# Patient Record
Sex: Male | Born: 1965 | Race: White | Smoking: Never smoker
Health system: Southern US, Community
[De-identification: ages and names within clinical notes are randomized; demographics above are authoritative.]

## PROBLEM LIST (undated history)

## (undated) DIAGNOSIS — D638 Anemia in other chronic diseases classified elsewhere: Secondary | ICD-10-CM

## (undated) DIAGNOSIS — B1920 Unspecified viral hepatitis C without hepatic coma: Secondary | ICD-10-CM

## (undated) DIAGNOSIS — B49 Unspecified mycosis: Secondary | ICD-10-CM

## (undated) DIAGNOSIS — E119 Type 2 diabetes mellitus without complications: Secondary | ICD-10-CM

## (undated) DIAGNOSIS — K219 Gastro-esophageal reflux disease without esophagitis: Secondary | ICD-10-CM

## (undated) DIAGNOSIS — Z794 Long term (current) use of insulin: Secondary | ICD-10-CM

## (undated) HISTORY — PX: TRACHEOSTOMY: SUR1362

---

## 2019-03-07 DIAGNOSIS — J9691 Respiratory failure, unspecified with hypoxia: Secondary | ICD-10-CM

## 2019-03-07 HISTORY — DX: Respiratory failure, unspecified with hypoxia: J96.91

## 2019-03-07 HISTORY — PX: CERVICAL SPINE SURGERY: SHX589

## 2019-09-04 DIAGNOSIS — I469 Cardiac arrest, cause unspecified: Secondary | ICD-10-CM

## 2019-09-04 DIAGNOSIS — R001 Bradycardia, unspecified: Secondary | ICD-10-CM

## 2019-09-04 HISTORY — DX: Bradycardia, unspecified: R00.1

## 2019-09-04 HISTORY — PX: PACEMAKER LEADLESS INSERTION: EP1219

## 2019-09-04 HISTORY — DX: Cardiac arrest, cause unspecified: I46.9

## 2019-11-17 DIAGNOSIS — G8250 Quadriplegia, unspecified: Secondary | ICD-10-CM | POA: Diagnosis not present

## 2019-11-17 DIAGNOSIS — I5020 Unspecified systolic (congestive) heart failure: Secondary | ICD-10-CM | POA: Diagnosis not present

## 2019-11-17 DIAGNOSIS — G894 Chronic pain syndrome: Secondary | ICD-10-CM | POA: Diagnosis not present

## 2019-11-17 DIAGNOSIS — J9621 Acute and chronic respiratory failure with hypoxia: Secondary | ICD-10-CM | POA: Diagnosis not present

## 2019-11-18 DIAGNOSIS — G894 Chronic pain syndrome: Secondary | ICD-10-CM | POA: Diagnosis not present

## 2019-11-18 DIAGNOSIS — J9621 Acute and chronic respiratory failure with hypoxia: Secondary | ICD-10-CM | POA: Diagnosis not present

## 2019-11-18 DIAGNOSIS — G8250 Quadriplegia, unspecified: Secondary | ICD-10-CM | POA: Diagnosis not present

## 2019-11-18 DIAGNOSIS — I5020 Unspecified systolic (congestive) heart failure: Secondary | ICD-10-CM | POA: Diagnosis not present

## 2019-11-19 DIAGNOSIS — J9621 Acute and chronic respiratory failure with hypoxia: Secondary | ICD-10-CM | POA: Diagnosis not present

## 2019-11-19 DIAGNOSIS — G8250 Quadriplegia, unspecified: Secondary | ICD-10-CM | POA: Diagnosis not present

## 2019-11-19 DIAGNOSIS — I5020 Unspecified systolic (congestive) heart failure: Secondary | ICD-10-CM | POA: Diagnosis not present

## 2019-11-19 DIAGNOSIS — G894 Chronic pain syndrome: Secondary | ICD-10-CM | POA: Diagnosis not present

## 2019-11-20 DIAGNOSIS — G8250 Quadriplegia, unspecified: Secondary | ICD-10-CM | POA: Diagnosis not present

## 2019-11-20 DIAGNOSIS — J9621 Acute and chronic respiratory failure with hypoxia: Secondary | ICD-10-CM | POA: Diagnosis not present

## 2019-11-20 DIAGNOSIS — G894 Chronic pain syndrome: Secondary | ICD-10-CM | POA: Diagnosis not present

## 2019-11-20 DIAGNOSIS — I5020 Unspecified systolic (congestive) heart failure: Secondary | ICD-10-CM | POA: Diagnosis not present

## 2019-11-21 DIAGNOSIS — G894 Chronic pain syndrome: Secondary | ICD-10-CM | POA: Diagnosis not present

## 2019-11-21 DIAGNOSIS — J9621 Acute and chronic respiratory failure with hypoxia: Secondary | ICD-10-CM | POA: Diagnosis not present

## 2019-11-21 DIAGNOSIS — I5020 Unspecified systolic (congestive) heart failure: Secondary | ICD-10-CM | POA: Diagnosis not present

## 2019-11-21 DIAGNOSIS — G8250 Quadriplegia, unspecified: Secondary | ICD-10-CM | POA: Diagnosis not present

## 2019-11-22 DIAGNOSIS — G8250 Quadriplegia, unspecified: Secondary | ICD-10-CM | POA: Diagnosis not present

## 2019-11-22 DIAGNOSIS — G894 Chronic pain syndrome: Secondary | ICD-10-CM | POA: Diagnosis not present

## 2019-11-22 DIAGNOSIS — I5020 Unspecified systolic (congestive) heart failure: Secondary | ICD-10-CM | POA: Diagnosis not present

## 2019-11-22 DIAGNOSIS — J9621 Acute and chronic respiratory failure with hypoxia: Secondary | ICD-10-CM | POA: Diagnosis not present

## 2019-11-23 DIAGNOSIS — G8250 Quadriplegia, unspecified: Secondary | ICD-10-CM | POA: Diagnosis not present

## 2019-11-23 DIAGNOSIS — I5020 Unspecified systolic (congestive) heart failure: Secondary | ICD-10-CM | POA: Diagnosis not present

## 2019-11-23 DIAGNOSIS — G894 Chronic pain syndrome: Secondary | ICD-10-CM | POA: Diagnosis not present

## 2019-11-23 DIAGNOSIS — J9621 Acute and chronic respiratory failure with hypoxia: Secondary | ICD-10-CM | POA: Diagnosis not present

## 2019-12-01 DIAGNOSIS — G894 Chronic pain syndrome: Secondary | ICD-10-CM | POA: Diagnosis not present

## 2019-12-01 DIAGNOSIS — G8250 Quadriplegia, unspecified: Secondary | ICD-10-CM | POA: Diagnosis not present

## 2019-12-01 DIAGNOSIS — J9621 Acute and chronic respiratory failure with hypoxia: Secondary | ICD-10-CM | POA: Diagnosis not present

## 2019-12-01 DIAGNOSIS — I5020 Unspecified systolic (congestive) heart failure: Secondary | ICD-10-CM | POA: Diagnosis not present

## 2019-12-02 DIAGNOSIS — I5020 Unspecified systolic (congestive) heart failure: Secondary | ICD-10-CM | POA: Diagnosis not present

## 2019-12-02 DIAGNOSIS — G8250 Quadriplegia, unspecified: Secondary | ICD-10-CM | POA: Diagnosis not present

## 2019-12-02 DIAGNOSIS — G894 Chronic pain syndrome: Secondary | ICD-10-CM | POA: Diagnosis not present

## 2019-12-02 DIAGNOSIS — J9621 Acute and chronic respiratory failure with hypoxia: Secondary | ICD-10-CM | POA: Diagnosis not present

## 2019-12-03 DIAGNOSIS — G894 Chronic pain syndrome: Secondary | ICD-10-CM | POA: Diagnosis not present

## 2019-12-03 DIAGNOSIS — G8250 Quadriplegia, unspecified: Secondary | ICD-10-CM | POA: Diagnosis not present

## 2019-12-03 DIAGNOSIS — I5020 Unspecified systolic (congestive) heart failure: Secondary | ICD-10-CM | POA: Diagnosis not present

## 2019-12-03 DIAGNOSIS — J9621 Acute and chronic respiratory failure with hypoxia: Secondary | ICD-10-CM | POA: Diagnosis not present

## 2019-12-04 DIAGNOSIS — G894 Chronic pain syndrome: Secondary | ICD-10-CM | POA: Diagnosis not present

## 2019-12-04 DIAGNOSIS — G8250 Quadriplegia, unspecified: Secondary | ICD-10-CM | POA: Diagnosis not present

## 2019-12-04 DIAGNOSIS — J9621 Acute and chronic respiratory failure with hypoxia: Secondary | ICD-10-CM | POA: Diagnosis not present

## 2019-12-04 DIAGNOSIS — I5020 Unspecified systolic (congestive) heart failure: Secondary | ICD-10-CM | POA: Diagnosis not present

## 2019-12-05 DIAGNOSIS — G8250 Quadriplegia, unspecified: Secondary | ICD-10-CM

## 2019-12-05 DIAGNOSIS — J9621 Acute and chronic respiratory failure with hypoxia: Secondary | ICD-10-CM | POA: Diagnosis not present

## 2019-12-05 DIAGNOSIS — G894 Chronic pain syndrome: Secondary | ICD-10-CM | POA: Diagnosis not present

## 2019-12-05 DIAGNOSIS — I5020 Unspecified systolic (congestive) heart failure: Secondary | ICD-10-CM

## 2019-12-06 DIAGNOSIS — J9621 Acute and chronic respiratory failure with hypoxia: Secondary | ICD-10-CM | POA: Diagnosis not present

## 2019-12-06 DIAGNOSIS — G8250 Quadriplegia, unspecified: Secondary | ICD-10-CM

## 2019-12-06 DIAGNOSIS — G894 Chronic pain syndrome: Secondary | ICD-10-CM

## 2019-12-06 DIAGNOSIS — I5020 Unspecified systolic (congestive) heart failure: Secondary | ICD-10-CM | POA: Diagnosis not present

## 2019-12-07 DIAGNOSIS — I5020 Unspecified systolic (congestive) heart failure: Secondary | ICD-10-CM

## 2019-12-07 DIAGNOSIS — G8250 Quadriplegia, unspecified: Secondary | ICD-10-CM | POA: Diagnosis not present

## 2019-12-07 DIAGNOSIS — J9621 Acute and chronic respiratory failure with hypoxia: Secondary | ICD-10-CM | POA: Diagnosis not present

## 2019-12-07 DIAGNOSIS — G894 Chronic pain syndrome: Secondary | ICD-10-CM

## 2019-12-15 DIAGNOSIS — J9621 Acute and chronic respiratory failure with hypoxia: Secondary | ICD-10-CM | POA: Diagnosis not present

## 2019-12-15 DIAGNOSIS — G894 Chronic pain syndrome: Secondary | ICD-10-CM | POA: Diagnosis not present

## 2019-12-15 DIAGNOSIS — I5020 Unspecified systolic (congestive) heart failure: Secondary | ICD-10-CM | POA: Diagnosis not present

## 2019-12-15 DIAGNOSIS — G8250 Quadriplegia, unspecified: Secondary | ICD-10-CM | POA: Diagnosis not present

## 2019-12-16 DIAGNOSIS — J9621 Acute and chronic respiratory failure with hypoxia: Secondary | ICD-10-CM | POA: Diagnosis not present

## 2019-12-16 DIAGNOSIS — G894 Chronic pain syndrome: Secondary | ICD-10-CM | POA: Diagnosis not present

## 2019-12-16 DIAGNOSIS — G8250 Quadriplegia, unspecified: Secondary | ICD-10-CM | POA: Diagnosis not present

## 2019-12-16 DIAGNOSIS — I5020 Unspecified systolic (congestive) heart failure: Secondary | ICD-10-CM | POA: Diagnosis not present

## 2019-12-17 DIAGNOSIS — I5020 Unspecified systolic (congestive) heart failure: Secondary | ICD-10-CM | POA: Diagnosis not present

## 2019-12-17 DIAGNOSIS — J9621 Acute and chronic respiratory failure with hypoxia: Secondary | ICD-10-CM | POA: Diagnosis not present

## 2019-12-17 DIAGNOSIS — G894 Chronic pain syndrome: Secondary | ICD-10-CM | POA: Diagnosis not present

## 2019-12-17 DIAGNOSIS — G8250 Quadriplegia, unspecified: Secondary | ICD-10-CM | POA: Diagnosis not present

## 2019-12-18 DIAGNOSIS — J9621 Acute and chronic respiratory failure with hypoxia: Secondary | ICD-10-CM | POA: Diagnosis not present

## 2019-12-18 DIAGNOSIS — I5020 Unspecified systolic (congestive) heart failure: Secondary | ICD-10-CM | POA: Diagnosis not present

## 2019-12-18 DIAGNOSIS — G8250 Quadriplegia, unspecified: Secondary | ICD-10-CM | POA: Diagnosis not present

## 2019-12-18 DIAGNOSIS — G894 Chronic pain syndrome: Secondary | ICD-10-CM | POA: Diagnosis not present

## 2019-12-19 DIAGNOSIS — I5020 Unspecified systolic (congestive) heart failure: Secondary | ICD-10-CM | POA: Diagnosis not present

## 2019-12-19 DIAGNOSIS — G8250 Quadriplegia, unspecified: Secondary | ICD-10-CM

## 2019-12-19 DIAGNOSIS — G894 Chronic pain syndrome: Secondary | ICD-10-CM | POA: Diagnosis not present

## 2019-12-19 DIAGNOSIS — J9621 Acute and chronic respiratory failure with hypoxia: Secondary | ICD-10-CM | POA: Diagnosis not present

## 2019-12-20 DIAGNOSIS — I5020 Unspecified systolic (congestive) heart failure: Secondary | ICD-10-CM | POA: Diagnosis not present

## 2019-12-20 DIAGNOSIS — G8250 Quadriplegia, unspecified: Secondary | ICD-10-CM

## 2019-12-20 DIAGNOSIS — J9621 Acute and chronic respiratory failure with hypoxia: Secondary | ICD-10-CM

## 2019-12-20 DIAGNOSIS — G894 Chronic pain syndrome: Secondary | ICD-10-CM

## 2019-12-21 DIAGNOSIS — G8250 Quadriplegia, unspecified: Secondary | ICD-10-CM | POA: Diagnosis not present

## 2019-12-21 DIAGNOSIS — I5020 Unspecified systolic (congestive) heart failure: Secondary | ICD-10-CM | POA: Diagnosis not present

## 2019-12-21 DIAGNOSIS — J9621 Acute and chronic respiratory failure with hypoxia: Secondary | ICD-10-CM | POA: Diagnosis not present

## 2019-12-21 DIAGNOSIS — G894 Chronic pain syndrome: Secondary | ICD-10-CM | POA: Diagnosis not present

## 2019-12-27 DIAGNOSIS — I5020 Unspecified systolic (congestive) heart failure: Secondary | ICD-10-CM | POA: Diagnosis not present

## 2019-12-27 DIAGNOSIS — G8250 Quadriplegia, unspecified: Secondary | ICD-10-CM

## 2019-12-27 DIAGNOSIS — J9621 Acute and chronic respiratory failure with hypoxia: Secondary | ICD-10-CM

## 2019-12-27 DIAGNOSIS — G894 Chronic pain syndrome: Secondary | ICD-10-CM

## 2019-12-28 DIAGNOSIS — J9621 Acute and chronic respiratory failure with hypoxia: Secondary | ICD-10-CM | POA: Diagnosis not present

## 2019-12-28 DIAGNOSIS — I5020 Unspecified systolic (congestive) heart failure: Secondary | ICD-10-CM

## 2019-12-28 DIAGNOSIS — G8250 Quadriplegia, unspecified: Secondary | ICD-10-CM | POA: Diagnosis not present

## 2019-12-28 DIAGNOSIS — G894 Chronic pain syndrome: Secondary | ICD-10-CM | POA: Diagnosis not present

## 2019-12-29 DIAGNOSIS — J9621 Acute and chronic respiratory failure with hypoxia: Secondary | ICD-10-CM | POA: Diagnosis not present

## 2019-12-29 DIAGNOSIS — I5020 Unspecified systolic (congestive) heart failure: Secondary | ICD-10-CM

## 2019-12-29 DIAGNOSIS — G894 Chronic pain syndrome: Secondary | ICD-10-CM

## 2019-12-29 DIAGNOSIS — G8250 Quadriplegia, unspecified: Secondary | ICD-10-CM

## 2020-01-12 DIAGNOSIS — J9621 Acute and chronic respiratory failure with hypoxia: Secondary | ICD-10-CM | POA: Diagnosis not present

## 2020-01-12 DIAGNOSIS — I5020 Unspecified systolic (congestive) heart failure: Secondary | ICD-10-CM

## 2020-01-12 DIAGNOSIS — G894 Chronic pain syndrome: Secondary | ICD-10-CM

## 2020-01-12 DIAGNOSIS — G8250 Quadriplegia, unspecified: Secondary | ICD-10-CM | POA: Diagnosis not present

## 2020-01-13 DIAGNOSIS — G8250 Quadriplegia, unspecified: Secondary | ICD-10-CM

## 2020-01-13 DIAGNOSIS — G894 Chronic pain syndrome: Secondary | ICD-10-CM

## 2020-01-13 DIAGNOSIS — I5020 Unspecified systolic (congestive) heart failure: Secondary | ICD-10-CM | POA: Diagnosis not present

## 2020-01-13 DIAGNOSIS — J9621 Acute and chronic respiratory failure with hypoxia: Secondary | ICD-10-CM | POA: Diagnosis not present

## 2020-01-14 DIAGNOSIS — I5020 Unspecified systolic (congestive) heart failure: Secondary | ICD-10-CM | POA: Diagnosis not present

## 2020-01-14 DIAGNOSIS — G8250 Quadriplegia, unspecified: Secondary | ICD-10-CM

## 2020-01-14 DIAGNOSIS — G894 Chronic pain syndrome: Secondary | ICD-10-CM

## 2020-01-14 DIAGNOSIS — J9621 Acute and chronic respiratory failure with hypoxia: Secondary | ICD-10-CM

## 2020-01-15 DIAGNOSIS — G894 Chronic pain syndrome: Secondary | ICD-10-CM | POA: Diagnosis not present

## 2020-01-15 DIAGNOSIS — G8250 Quadriplegia, unspecified: Secondary | ICD-10-CM | POA: Diagnosis not present

## 2020-01-15 DIAGNOSIS — I5020 Unspecified systolic (congestive) heart failure: Secondary | ICD-10-CM | POA: Diagnosis not present

## 2020-01-15 DIAGNOSIS — J9621 Acute and chronic respiratory failure with hypoxia: Secondary | ICD-10-CM | POA: Diagnosis not present

## 2020-01-16 DIAGNOSIS — G894 Chronic pain syndrome: Secondary | ICD-10-CM | POA: Diagnosis not present

## 2020-01-16 DIAGNOSIS — G8250 Quadriplegia, unspecified: Secondary | ICD-10-CM | POA: Diagnosis not present

## 2020-01-16 DIAGNOSIS — I5020 Unspecified systolic (congestive) heart failure: Secondary | ICD-10-CM | POA: Diagnosis not present

## 2020-01-16 DIAGNOSIS — J9621 Acute and chronic respiratory failure with hypoxia: Secondary | ICD-10-CM | POA: Diagnosis not present

## 2020-01-17 DIAGNOSIS — J9621 Acute and chronic respiratory failure with hypoxia: Secondary | ICD-10-CM | POA: Diagnosis not present

## 2020-01-17 DIAGNOSIS — G894 Chronic pain syndrome: Secondary | ICD-10-CM | POA: Diagnosis not present

## 2020-01-17 DIAGNOSIS — I5020 Unspecified systolic (congestive) heart failure: Secondary | ICD-10-CM | POA: Diagnosis not present

## 2020-01-17 DIAGNOSIS — G8250 Quadriplegia, unspecified: Secondary | ICD-10-CM | POA: Diagnosis not present

## 2020-01-18 DIAGNOSIS — I5020 Unspecified systolic (congestive) heart failure: Secondary | ICD-10-CM

## 2020-01-18 DIAGNOSIS — G894 Chronic pain syndrome: Secondary | ICD-10-CM | POA: Diagnosis not present

## 2020-01-18 DIAGNOSIS — J9621 Acute and chronic respiratory failure with hypoxia: Secondary | ICD-10-CM | POA: Diagnosis not present

## 2020-01-18 DIAGNOSIS — G8250 Quadriplegia, unspecified: Secondary | ICD-10-CM | POA: Diagnosis not present

## 2020-01-26 DIAGNOSIS — G894 Chronic pain syndrome: Secondary | ICD-10-CM | POA: Diagnosis not present

## 2020-01-26 DIAGNOSIS — I5020 Unspecified systolic (congestive) heart failure: Secondary | ICD-10-CM | POA: Diagnosis not present

## 2020-01-26 DIAGNOSIS — J9621 Acute and chronic respiratory failure with hypoxia: Secondary | ICD-10-CM | POA: Diagnosis not present

## 2020-01-26 DIAGNOSIS — G8250 Quadriplegia, unspecified: Secondary | ICD-10-CM | POA: Diagnosis not present

## 2020-01-27 DIAGNOSIS — G8250 Quadriplegia, unspecified: Secondary | ICD-10-CM | POA: Diagnosis not present

## 2020-01-27 DIAGNOSIS — I5020 Unspecified systolic (congestive) heart failure: Secondary | ICD-10-CM | POA: Diagnosis not present

## 2020-01-27 DIAGNOSIS — J9621 Acute and chronic respiratory failure with hypoxia: Secondary | ICD-10-CM | POA: Diagnosis not present

## 2020-01-27 DIAGNOSIS — G894 Chronic pain syndrome: Secondary | ICD-10-CM | POA: Diagnosis not present

## 2020-01-28 DIAGNOSIS — G8250 Quadriplegia, unspecified: Secondary | ICD-10-CM | POA: Diagnosis not present

## 2020-01-28 DIAGNOSIS — G894 Chronic pain syndrome: Secondary | ICD-10-CM | POA: Diagnosis not present

## 2020-01-28 DIAGNOSIS — I5020 Unspecified systolic (congestive) heart failure: Secondary | ICD-10-CM | POA: Diagnosis not present

## 2020-01-28 DIAGNOSIS — J9621 Acute and chronic respiratory failure with hypoxia: Secondary | ICD-10-CM | POA: Diagnosis not present

## 2020-01-29 DIAGNOSIS — J9621 Acute and chronic respiratory failure with hypoxia: Secondary | ICD-10-CM | POA: Diagnosis not present

## 2020-01-29 DIAGNOSIS — G894 Chronic pain syndrome: Secondary | ICD-10-CM | POA: Diagnosis not present

## 2020-01-29 DIAGNOSIS — I5020 Unspecified systolic (congestive) heart failure: Secondary | ICD-10-CM | POA: Diagnosis not present

## 2020-01-29 DIAGNOSIS — G8250 Quadriplegia, unspecified: Secondary | ICD-10-CM | POA: Diagnosis not present

## 2020-01-30 DIAGNOSIS — G894 Chronic pain syndrome: Secondary | ICD-10-CM | POA: Diagnosis not present

## 2020-01-30 DIAGNOSIS — G8250 Quadriplegia, unspecified: Secondary | ICD-10-CM | POA: Diagnosis not present

## 2020-01-30 DIAGNOSIS — J9621 Acute and chronic respiratory failure with hypoxia: Secondary | ICD-10-CM | POA: Diagnosis not present

## 2020-01-30 DIAGNOSIS — I5020 Unspecified systolic (congestive) heart failure: Secondary | ICD-10-CM | POA: Diagnosis not present

## 2020-01-31 DIAGNOSIS — I5020 Unspecified systolic (congestive) heart failure: Secondary | ICD-10-CM | POA: Diagnosis not present

## 2020-01-31 DIAGNOSIS — J9621 Acute and chronic respiratory failure with hypoxia: Secondary | ICD-10-CM | POA: Diagnosis not present

## 2020-01-31 DIAGNOSIS — G8250 Quadriplegia, unspecified: Secondary | ICD-10-CM | POA: Diagnosis not present

## 2020-01-31 DIAGNOSIS — G894 Chronic pain syndrome: Secondary | ICD-10-CM | POA: Diagnosis not present

## 2020-02-01 DIAGNOSIS — G894 Chronic pain syndrome: Secondary | ICD-10-CM | POA: Diagnosis not present

## 2020-02-01 DIAGNOSIS — J9621 Acute and chronic respiratory failure with hypoxia: Secondary | ICD-10-CM | POA: Diagnosis not present

## 2020-02-01 DIAGNOSIS — I5020 Unspecified systolic (congestive) heart failure: Secondary | ICD-10-CM | POA: Diagnosis not present

## 2020-02-01 DIAGNOSIS — G8250 Quadriplegia, unspecified: Secondary | ICD-10-CM | POA: Diagnosis not present

## 2020-02-09 DIAGNOSIS — J9621 Acute and chronic respiratory failure with hypoxia: Secondary | ICD-10-CM

## 2020-02-09 DIAGNOSIS — G8250 Quadriplegia, unspecified: Secondary | ICD-10-CM | POA: Diagnosis not present

## 2020-02-09 DIAGNOSIS — I5020 Unspecified systolic (congestive) heart failure: Secondary | ICD-10-CM | POA: Diagnosis not present

## 2020-02-09 DIAGNOSIS — G894 Chronic pain syndrome: Secondary | ICD-10-CM

## 2020-02-10 DIAGNOSIS — G8250 Quadriplegia, unspecified: Secondary | ICD-10-CM | POA: Diagnosis not present

## 2020-02-10 DIAGNOSIS — J9621 Acute and chronic respiratory failure with hypoxia: Secondary | ICD-10-CM | POA: Diagnosis not present

## 2020-02-10 DIAGNOSIS — G894 Chronic pain syndrome: Secondary | ICD-10-CM | POA: Diagnosis not present

## 2020-02-10 DIAGNOSIS — I5020 Unspecified systolic (congestive) heart failure: Secondary | ICD-10-CM | POA: Diagnosis not present

## 2020-02-11 DIAGNOSIS — G894 Chronic pain syndrome: Secondary | ICD-10-CM | POA: Diagnosis not present

## 2020-02-11 DIAGNOSIS — I5020 Unspecified systolic (congestive) heart failure: Secondary | ICD-10-CM | POA: Diagnosis not present

## 2020-02-11 DIAGNOSIS — G8250 Quadriplegia, unspecified: Secondary | ICD-10-CM | POA: Diagnosis not present

## 2020-02-11 DIAGNOSIS — J9621 Acute and chronic respiratory failure with hypoxia: Secondary | ICD-10-CM | POA: Diagnosis not present

## 2020-02-12 DIAGNOSIS — J9621 Acute and chronic respiratory failure with hypoxia: Secondary | ICD-10-CM

## 2020-02-12 DIAGNOSIS — G894 Chronic pain syndrome: Secondary | ICD-10-CM

## 2020-02-12 DIAGNOSIS — I5020 Unspecified systolic (congestive) heart failure: Secondary | ICD-10-CM | POA: Diagnosis not present

## 2020-02-12 DIAGNOSIS — G8250 Quadriplegia, unspecified: Secondary | ICD-10-CM | POA: Diagnosis not present

## 2020-02-13 DIAGNOSIS — J9621 Acute and chronic respiratory failure with hypoxia: Secondary | ICD-10-CM | POA: Diagnosis not present

## 2020-02-13 DIAGNOSIS — I5020 Unspecified systolic (congestive) heart failure: Secondary | ICD-10-CM | POA: Diagnosis not present

## 2020-02-13 DIAGNOSIS — G8250 Quadriplegia, unspecified: Secondary | ICD-10-CM | POA: Diagnosis not present

## 2020-02-13 DIAGNOSIS — G894 Chronic pain syndrome: Secondary | ICD-10-CM | POA: Diagnosis not present

## 2020-02-14 DIAGNOSIS — G8250 Quadriplegia, unspecified: Secondary | ICD-10-CM | POA: Diagnosis not present

## 2020-02-14 DIAGNOSIS — J9621 Acute and chronic respiratory failure with hypoxia: Secondary | ICD-10-CM | POA: Diagnosis not present

## 2020-02-14 DIAGNOSIS — I5020 Unspecified systolic (congestive) heart failure: Secondary | ICD-10-CM | POA: Diagnosis not present

## 2020-02-14 DIAGNOSIS — G894 Chronic pain syndrome: Secondary | ICD-10-CM | POA: Diagnosis not present

## 2020-02-15 DIAGNOSIS — G8250 Quadriplegia, unspecified: Secondary | ICD-10-CM | POA: Diagnosis not present

## 2020-02-15 DIAGNOSIS — I5020 Unspecified systolic (congestive) heart failure: Secondary | ICD-10-CM | POA: Diagnosis not present

## 2020-02-15 DIAGNOSIS — G894 Chronic pain syndrome: Secondary | ICD-10-CM | POA: Diagnosis not present

## 2020-02-15 DIAGNOSIS — J9621 Acute and chronic respiratory failure with hypoxia: Secondary | ICD-10-CM | POA: Diagnosis not present

## 2020-02-23 DIAGNOSIS — G894 Chronic pain syndrome: Secondary | ICD-10-CM | POA: Diagnosis not present

## 2020-02-23 DIAGNOSIS — J9621 Acute and chronic respiratory failure with hypoxia: Secondary | ICD-10-CM | POA: Diagnosis not present

## 2020-02-23 DIAGNOSIS — G8250 Quadriplegia, unspecified: Secondary | ICD-10-CM | POA: Diagnosis not present

## 2020-02-23 DIAGNOSIS — I5020 Unspecified systolic (congestive) heart failure: Secondary | ICD-10-CM | POA: Diagnosis not present

## 2020-02-24 DIAGNOSIS — I5020 Unspecified systolic (congestive) heart failure: Secondary | ICD-10-CM | POA: Diagnosis not present

## 2020-02-24 DIAGNOSIS — G8250 Quadriplegia, unspecified: Secondary | ICD-10-CM | POA: Diagnosis not present

## 2020-02-24 DIAGNOSIS — G894 Chronic pain syndrome: Secondary | ICD-10-CM | POA: Diagnosis not present

## 2020-02-24 DIAGNOSIS — J9621 Acute and chronic respiratory failure with hypoxia: Secondary | ICD-10-CM | POA: Diagnosis not present

## 2020-02-25 DIAGNOSIS — I5020 Unspecified systolic (congestive) heart failure: Secondary | ICD-10-CM | POA: Diagnosis not present

## 2020-02-25 DIAGNOSIS — J9621 Acute and chronic respiratory failure with hypoxia: Secondary | ICD-10-CM | POA: Diagnosis not present

## 2020-02-25 DIAGNOSIS — G8250 Quadriplegia, unspecified: Secondary | ICD-10-CM | POA: Diagnosis not present

## 2020-02-25 DIAGNOSIS — G894 Chronic pain syndrome: Secondary | ICD-10-CM | POA: Diagnosis not present

## 2020-02-26 DIAGNOSIS — G8250 Quadriplegia, unspecified: Secondary | ICD-10-CM | POA: Diagnosis not present

## 2020-02-26 DIAGNOSIS — J9621 Acute and chronic respiratory failure with hypoxia: Secondary | ICD-10-CM | POA: Diagnosis not present

## 2020-02-26 DIAGNOSIS — I5020 Unspecified systolic (congestive) heart failure: Secondary | ICD-10-CM | POA: Diagnosis not present

## 2020-02-26 DIAGNOSIS — G894 Chronic pain syndrome: Secondary | ICD-10-CM | POA: Diagnosis not present

## 2020-02-27 DIAGNOSIS — G894 Chronic pain syndrome: Secondary | ICD-10-CM | POA: Diagnosis not present

## 2020-02-27 DIAGNOSIS — J9621 Acute and chronic respiratory failure with hypoxia: Secondary | ICD-10-CM | POA: Diagnosis not present

## 2020-02-27 DIAGNOSIS — I5020 Unspecified systolic (congestive) heart failure: Secondary | ICD-10-CM | POA: Diagnosis not present

## 2020-02-27 DIAGNOSIS — G8250 Quadriplegia, unspecified: Secondary | ICD-10-CM | POA: Diagnosis not present

## 2020-02-28 DIAGNOSIS — J9621 Acute and chronic respiratory failure with hypoxia: Secondary | ICD-10-CM | POA: Diagnosis not present

## 2020-02-28 DIAGNOSIS — G894 Chronic pain syndrome: Secondary | ICD-10-CM | POA: Diagnosis not present

## 2020-02-28 DIAGNOSIS — I5020 Unspecified systolic (congestive) heart failure: Secondary | ICD-10-CM | POA: Diagnosis not present

## 2020-02-28 DIAGNOSIS — G8250 Quadriplegia, unspecified: Secondary | ICD-10-CM | POA: Diagnosis not present

## 2020-02-29 DIAGNOSIS — J9621 Acute and chronic respiratory failure with hypoxia: Secondary | ICD-10-CM | POA: Diagnosis not present

## 2020-02-29 DIAGNOSIS — G8250 Quadriplegia, unspecified: Secondary | ICD-10-CM | POA: Diagnosis not present

## 2020-02-29 DIAGNOSIS — G894 Chronic pain syndrome: Secondary | ICD-10-CM | POA: Diagnosis not present

## 2020-02-29 DIAGNOSIS — I5020 Unspecified systolic (congestive) heart failure: Secondary | ICD-10-CM | POA: Diagnosis not present

## 2020-03-08 DIAGNOSIS — G8250 Quadriplegia, unspecified: Secondary | ICD-10-CM | POA: Diagnosis not present

## 2020-03-08 DIAGNOSIS — J9621 Acute and chronic respiratory failure with hypoxia: Secondary | ICD-10-CM | POA: Diagnosis not present

## 2020-03-08 DIAGNOSIS — I5020 Unspecified systolic (congestive) heart failure: Secondary | ICD-10-CM | POA: Diagnosis not present

## 2020-03-08 DIAGNOSIS — G894 Chronic pain syndrome: Secondary | ICD-10-CM | POA: Diagnosis not present

## 2020-03-09 DIAGNOSIS — I5020 Unspecified systolic (congestive) heart failure: Secondary | ICD-10-CM | POA: Diagnosis not present

## 2020-03-09 DIAGNOSIS — J9621 Acute and chronic respiratory failure with hypoxia: Secondary | ICD-10-CM | POA: Diagnosis not present

## 2020-03-09 DIAGNOSIS — G894 Chronic pain syndrome: Secondary | ICD-10-CM | POA: Diagnosis not present

## 2020-03-09 DIAGNOSIS — G8250 Quadriplegia, unspecified: Secondary | ICD-10-CM | POA: Diagnosis not present

## 2020-03-10 DIAGNOSIS — J9621 Acute and chronic respiratory failure with hypoxia: Secondary | ICD-10-CM | POA: Diagnosis not present

## 2020-03-10 DIAGNOSIS — G8250 Quadriplegia, unspecified: Secondary | ICD-10-CM | POA: Diagnosis not present

## 2020-03-10 DIAGNOSIS — I5020 Unspecified systolic (congestive) heart failure: Secondary | ICD-10-CM | POA: Diagnosis not present

## 2020-03-10 DIAGNOSIS — G894 Chronic pain syndrome: Secondary | ICD-10-CM | POA: Diagnosis not present

## 2020-03-11 DIAGNOSIS — G894 Chronic pain syndrome: Secondary | ICD-10-CM | POA: Diagnosis not present

## 2020-03-11 DIAGNOSIS — G8250 Quadriplegia, unspecified: Secondary | ICD-10-CM | POA: Diagnosis not present

## 2020-03-11 DIAGNOSIS — J9621 Acute and chronic respiratory failure with hypoxia: Secondary | ICD-10-CM | POA: Diagnosis not present

## 2020-03-11 DIAGNOSIS — I5020 Unspecified systolic (congestive) heart failure: Secondary | ICD-10-CM | POA: Diagnosis not present

## 2020-03-12 DIAGNOSIS — I5020 Unspecified systolic (congestive) heart failure: Secondary | ICD-10-CM | POA: Diagnosis not present

## 2020-03-12 DIAGNOSIS — J9621 Acute and chronic respiratory failure with hypoxia: Secondary | ICD-10-CM | POA: Diagnosis not present

## 2020-03-12 DIAGNOSIS — G894 Chronic pain syndrome: Secondary | ICD-10-CM | POA: Diagnosis not present

## 2020-03-12 DIAGNOSIS — G8250 Quadriplegia, unspecified: Secondary | ICD-10-CM | POA: Diagnosis not present

## 2020-03-13 DIAGNOSIS — G8250 Quadriplegia, unspecified: Secondary | ICD-10-CM | POA: Diagnosis not present

## 2020-03-13 DIAGNOSIS — J9621 Acute and chronic respiratory failure with hypoxia: Secondary | ICD-10-CM

## 2020-03-13 DIAGNOSIS — I5020 Unspecified systolic (congestive) heart failure: Secondary | ICD-10-CM | POA: Diagnosis not present

## 2020-03-13 DIAGNOSIS — G894 Chronic pain syndrome: Secondary | ICD-10-CM

## 2020-03-14 DIAGNOSIS — J9621 Acute and chronic respiratory failure with hypoxia: Secondary | ICD-10-CM

## 2020-03-14 DIAGNOSIS — G894 Chronic pain syndrome: Secondary | ICD-10-CM | POA: Diagnosis not present

## 2020-03-14 DIAGNOSIS — G8250 Quadriplegia, unspecified: Secondary | ICD-10-CM

## 2020-03-14 DIAGNOSIS — I5020 Unspecified systolic (congestive) heart failure: Secondary | ICD-10-CM

## 2020-03-22 DIAGNOSIS — G894 Chronic pain syndrome: Secondary | ICD-10-CM

## 2020-03-22 DIAGNOSIS — I5020 Unspecified systolic (congestive) heart failure: Secondary | ICD-10-CM

## 2020-03-22 DIAGNOSIS — J9621 Acute and chronic respiratory failure with hypoxia: Secondary | ICD-10-CM

## 2020-03-22 DIAGNOSIS — G8250 Quadriplegia, unspecified: Secondary | ICD-10-CM

## 2020-03-23 DIAGNOSIS — G894 Chronic pain syndrome: Secondary | ICD-10-CM | POA: Diagnosis not present

## 2020-03-23 DIAGNOSIS — G8250 Quadriplegia, unspecified: Secondary | ICD-10-CM | POA: Diagnosis not present

## 2020-03-23 DIAGNOSIS — J9621 Acute and chronic respiratory failure with hypoxia: Secondary | ICD-10-CM | POA: Diagnosis not present

## 2020-03-23 DIAGNOSIS — I5020 Unspecified systolic (congestive) heart failure: Secondary | ICD-10-CM | POA: Diagnosis not present

## 2020-03-24 DIAGNOSIS — G894 Chronic pain syndrome: Secondary | ICD-10-CM | POA: Diagnosis not present

## 2020-03-24 DIAGNOSIS — G8250 Quadriplegia, unspecified: Secondary | ICD-10-CM | POA: Diagnosis not present

## 2020-03-24 DIAGNOSIS — I5020 Unspecified systolic (congestive) heart failure: Secondary | ICD-10-CM | POA: Diagnosis not present

## 2020-03-24 DIAGNOSIS — J9621 Acute and chronic respiratory failure with hypoxia: Secondary | ICD-10-CM | POA: Diagnosis not present

## 2020-03-25 DIAGNOSIS — I5020 Unspecified systolic (congestive) heart failure: Secondary | ICD-10-CM | POA: Diagnosis not present

## 2020-03-25 DIAGNOSIS — J9621 Acute and chronic respiratory failure with hypoxia: Secondary | ICD-10-CM | POA: Diagnosis not present

## 2020-03-25 DIAGNOSIS — G8250 Quadriplegia, unspecified: Secondary | ICD-10-CM | POA: Diagnosis not present

## 2020-03-25 DIAGNOSIS — G894 Chronic pain syndrome: Secondary | ICD-10-CM | POA: Diagnosis not present

## 2020-04-29 ENCOUNTER — Other Ambulatory Visit: Payer: Self-pay

## 2020-04-29 ENCOUNTER — Encounter (HOSPITAL_COMMUNITY): Payer: Self-pay | Admitting: Emergency Medicine

## 2020-04-29 ENCOUNTER — Inpatient Hospital Stay (HOSPITAL_COMMUNITY)
Admission: EM | Admit: 2020-04-29 | Discharge: 2020-07-04 | DRG: 871 | Disposition: E | Payer: Medicare Other | Attending: Internal Medicine | Admitting: Internal Medicine

## 2020-04-29 ENCOUNTER — Emergency Department (HOSPITAL_COMMUNITY): Payer: Medicare Other

## 2020-04-29 DIAGNOSIS — F32A Depression, unspecified: Secondary | ICD-10-CM | POA: Diagnosis present

## 2020-04-29 DIAGNOSIS — L89154 Pressure ulcer of sacral region, stage 4: Secondary | ICD-10-CM | POA: Diagnosis present

## 2020-04-29 DIAGNOSIS — E1169 Type 2 diabetes mellitus with other specified complication: Secondary | ICD-10-CM | POA: Diagnosis present

## 2020-04-29 DIAGNOSIS — I5030 Unspecified diastolic (congestive) heart failure: Secondary | ICD-10-CM | POA: Diagnosis not present

## 2020-04-29 DIAGNOSIS — D649 Anemia, unspecified: Secondary | ICD-10-CM

## 2020-04-29 DIAGNOSIS — S41111A Laceration without foreign body of right upper arm, initial encounter: Secondary | ICD-10-CM | POA: Diagnosis present

## 2020-04-29 DIAGNOSIS — J90 Pleural effusion, not elsewhere classified: Secondary | ICD-10-CM | POA: Diagnosis not present

## 2020-04-29 DIAGNOSIS — Z66 Do not resuscitate: Secondary | ICD-10-CM | POA: Diagnosis not present

## 2020-04-29 DIAGNOSIS — K7460 Unspecified cirrhosis of liver: Secondary | ICD-10-CM | POA: Diagnosis present

## 2020-04-29 DIAGNOSIS — J15212 Pneumonia due to Methicillin resistant Staphylococcus aureus: Secondary | ICD-10-CM | POA: Diagnosis not present

## 2020-04-29 DIAGNOSIS — K9413 Enterostomy malfunction: Secondary | ICD-10-CM

## 2020-04-29 DIAGNOSIS — I455 Other specified heart block: Secondary | ICD-10-CM

## 2020-04-29 DIAGNOSIS — A498 Other bacterial infections of unspecified site: Secondary | ICD-10-CM | POA: Diagnosis not present

## 2020-04-29 DIAGNOSIS — A4189 Other specified sepsis: Secondary | ICD-10-CM | POA: Diagnosis not present

## 2020-04-29 DIAGNOSIS — M25512 Pain in left shoulder: Secondary | ICD-10-CM

## 2020-04-29 DIAGNOSIS — R7881 Bacteremia: Secondary | ICD-10-CM

## 2020-04-29 DIAGNOSIS — A419 Sepsis, unspecified organism: Secondary | ICD-10-CM | POA: Diagnosis present

## 2020-04-29 DIAGNOSIS — Z1612 Extended spectrum beta lactamase (ESBL) resistance: Secondary | ICD-10-CM | POA: Diagnosis not present

## 2020-04-29 DIAGNOSIS — E43 Unspecified severe protein-calorie malnutrition: Secondary | ICD-10-CM

## 2020-04-29 DIAGNOSIS — Z4659 Encounter for fitting and adjustment of other gastrointestinal appliance and device: Secondary | ICD-10-CM | POA: Diagnosis not present

## 2020-04-29 DIAGNOSIS — D638 Anemia in other chronic diseases classified elsewhere: Secondary | ICD-10-CM | POA: Diagnosis present

## 2020-04-29 DIAGNOSIS — J189 Pneumonia, unspecified organism: Secondary | ICD-10-CM

## 2020-04-29 DIAGNOSIS — B961 Klebsiella pneumoniae [K. pneumoniae] as the cause of diseases classified elsewhere: Secondary | ICD-10-CM | POA: Diagnosis not present

## 2020-04-29 DIAGNOSIS — F1120 Opioid dependence, uncomplicated: Secondary | ICD-10-CM | POA: Diagnosis not present

## 2020-04-29 DIAGNOSIS — G903 Multi-system degeneration of the autonomic nervous system: Secondary | ICD-10-CM | POA: Diagnosis not present

## 2020-04-29 DIAGNOSIS — I5031 Acute diastolic (congestive) heart failure: Secondary | ICD-10-CM | POA: Diagnosis not present

## 2020-04-29 DIAGNOSIS — K219 Gastro-esophageal reflux disease without esophagitis: Secondary | ICD-10-CM | POA: Diagnosis present

## 2020-04-29 DIAGNOSIS — Z8661 Personal history of infections of the central nervous system: Secondary | ICD-10-CM

## 2020-04-29 DIAGNOSIS — G8250 Quadriplegia, unspecified: Secondary | ICD-10-CM | POA: Diagnosis not present

## 2020-04-29 DIAGNOSIS — L89023 Pressure ulcer of left elbow, stage 3: Secondary | ICD-10-CM | POA: Diagnosis not present

## 2020-04-29 DIAGNOSIS — L89894 Pressure ulcer of other site, stage 4: Secondary | ICD-10-CM | POA: Diagnosis present

## 2020-04-29 DIAGNOSIS — L89214 Pressure ulcer of right hip, stage 4: Secondary | ICD-10-CM | POA: Diagnosis present

## 2020-04-29 DIAGNOSIS — E872 Acidosis: Secondary | ICD-10-CM | POA: Diagnosis present

## 2020-04-29 DIAGNOSIS — Z515 Encounter for palliative care: Secondary | ICD-10-CM

## 2020-04-29 DIAGNOSIS — J9622 Acute and chronic respiratory failure with hypercapnia: Secondary | ICD-10-CM | POA: Diagnosis present

## 2020-04-29 DIAGNOSIS — J9621 Acute and chronic respiratory failure with hypoxia: Secondary | ICD-10-CM | POA: Diagnosis present

## 2020-04-29 DIAGNOSIS — A4159 Other Gram-negative sepsis: Principal | ICD-10-CM | POA: Diagnosis present

## 2020-04-29 DIAGNOSIS — Z825 Family history of asthma and other chronic lower respiratory diseases: Secondary | ICD-10-CM

## 2020-04-29 DIAGNOSIS — R6521 Severe sepsis with septic shock: Secondary | ICD-10-CM | POA: Diagnosis not present

## 2020-04-29 DIAGNOSIS — Z993 Dependence on wheelchair: Secondary | ICD-10-CM

## 2020-04-29 DIAGNOSIS — J9601 Acute respiratory failure with hypoxia: Secondary | ICD-10-CM | POA: Diagnosis not present

## 2020-04-29 DIAGNOSIS — L89104 Pressure ulcer of unspecified part of back, stage 4: Secondary | ICD-10-CM | POA: Diagnosis not present

## 2020-04-29 DIAGNOSIS — Z9911 Dependence on respirator [ventilator] status: Secondary | ICD-10-CM

## 2020-04-29 DIAGNOSIS — Z934 Other artificial openings of gastrointestinal tract status: Secondary | ICD-10-CM

## 2020-04-29 DIAGNOSIS — I130 Hypertensive heart and chronic kidney disease with heart failure and stage 1 through stage 4 chronic kidney disease, or unspecified chronic kidney disease: Secondary | ICD-10-CM | POA: Diagnosis present

## 2020-04-29 DIAGNOSIS — D62 Acute posthemorrhagic anemia: Secondary | ICD-10-CM | POA: Diagnosis present

## 2020-04-29 DIAGNOSIS — L910 Hypertrophic scar: Secondary | ICD-10-CM | POA: Diagnosis present

## 2020-04-29 DIAGNOSIS — Z20822 Contact with and (suspected) exposure to covid-19: Secondary | ICD-10-CM | POA: Diagnosis present

## 2020-04-29 DIAGNOSIS — R601 Generalized edema: Secondary | ICD-10-CM | POA: Diagnosis not present

## 2020-04-29 DIAGNOSIS — I5043 Acute on chronic combined systolic (congestive) and diastolic (congestive) heart failure: Secondary | ICD-10-CM | POA: Diagnosis not present

## 2020-04-29 DIAGNOSIS — Z930 Tracheostomy status: Secondary | ICD-10-CM

## 2020-04-29 DIAGNOSIS — J9611 Chronic respiratory failure with hypoxia: Secondary | ICD-10-CM | POA: Diagnosis not present

## 2020-04-29 DIAGNOSIS — Z452 Encounter for adjustment and management of vascular access device: Secondary | ICD-10-CM | POA: Diagnosis not present

## 2020-04-29 DIAGNOSIS — N179 Acute kidney failure, unspecified: Secondary | ICD-10-CM | POA: Diagnosis not present

## 2020-04-29 DIAGNOSIS — I5020 Unspecified systolic (congestive) heart failure: Secondary | ICD-10-CM

## 2020-04-29 DIAGNOSIS — D691 Qualitative platelet defects: Secondary | ICD-10-CM | POA: Diagnosis present

## 2020-04-29 DIAGNOSIS — J9600 Acute respiratory failure, unspecified whether with hypoxia or hypercapnia: Secondary | ICD-10-CM

## 2020-04-29 DIAGNOSIS — Z8249 Family history of ischemic heart disease and other diseases of the circulatory system: Secondary | ICD-10-CM

## 2020-04-29 DIAGNOSIS — B370 Candidal stomatitis: Secondary | ICD-10-CM | POA: Diagnosis not present

## 2020-04-29 DIAGNOSIS — E440 Moderate protein-calorie malnutrition: Secondary | ICD-10-CM | POA: Insufficient documentation

## 2020-04-29 DIAGNOSIS — G894 Chronic pain syndrome: Secondary | ICD-10-CM | POA: Diagnosis present

## 2020-04-29 DIAGNOSIS — M4622 Osteomyelitis of vertebra, cervical region: Secondary | ICD-10-CM | POA: Diagnosis present

## 2020-04-29 DIAGNOSIS — I959 Hypotension, unspecified: Secondary | ICD-10-CM

## 2020-04-29 DIAGNOSIS — J9610 Chronic respiratory failure, unspecified whether with hypoxia or hypercapnia: Secondary | ICD-10-CM | POA: Diagnosis not present

## 2020-04-29 DIAGNOSIS — L8990 Pressure ulcer of unspecified site, unspecified stage: Secondary | ICD-10-CM | POA: Insufficient documentation

## 2020-04-29 DIAGNOSIS — A4902 Methicillin resistant Staphylococcus aureus infection, unspecified site: Secondary | ICD-10-CM

## 2020-04-29 DIAGNOSIS — I5082 Biventricular heart failure: Secondary | ICD-10-CM | POA: Diagnosis present

## 2020-04-29 DIAGNOSIS — Z79891 Long term (current) use of opiate analgesic: Secondary | ICD-10-CM

## 2020-04-29 DIAGNOSIS — Z888 Allergy status to other drugs, medicaments and biological substances status: Secondary | ICD-10-CM

## 2020-04-29 DIAGNOSIS — N5089 Other specified disorders of the male genital organs: Secondary | ICD-10-CM | POA: Diagnosis present

## 2020-04-29 DIAGNOSIS — B1920 Unspecified viral hepatitis C without hepatic coma: Secondary | ICD-10-CM | POA: Diagnosis not present

## 2020-04-29 DIAGNOSIS — Z79899 Other long term (current) drug therapy: Secondary | ICD-10-CM

## 2020-04-29 DIAGNOSIS — I82612 Acute embolism and thrombosis of superficial veins of left upper extremity: Secondary | ICD-10-CM | POA: Diagnosis not present

## 2020-04-29 DIAGNOSIS — B182 Chronic viral hepatitis C: Secondary | ICD-10-CM | POA: Diagnosis not present

## 2020-04-29 DIAGNOSIS — E876 Hypokalemia: Secondary | ICD-10-CM | POA: Diagnosis not present

## 2020-04-29 DIAGNOSIS — R195 Other fecal abnormalities: Secondary | ICD-10-CM | POA: Diagnosis present

## 2020-04-29 DIAGNOSIS — Z7189 Other specified counseling: Secondary | ICD-10-CM | POA: Diagnosis not present

## 2020-04-29 DIAGNOSIS — N170 Acute kidney failure with tubular necrosis: Secondary | ICD-10-CM | POA: Diagnosis present

## 2020-04-29 DIAGNOSIS — N1832 Chronic kidney disease, stage 3b: Secondary | ICD-10-CM

## 2020-04-29 DIAGNOSIS — F419 Anxiety disorder, unspecified: Secondary | ICD-10-CM | POA: Diagnosis present

## 2020-04-29 DIAGNOSIS — R4189 Other symptoms and signs involving cognitive functions and awareness: Secondary | ICD-10-CM | POA: Diagnosis not present

## 2020-04-29 DIAGNOSIS — R52 Pain, unspecified: Secondary | ICD-10-CM

## 2020-04-29 DIAGNOSIS — Z6832 Body mass index (BMI) 32.0-32.9, adult: Secondary | ICD-10-CM

## 2020-04-29 DIAGNOSIS — Z8614 Personal history of Methicillin resistant Staphylococcus aureus infection: Secondary | ICD-10-CM

## 2020-04-29 DIAGNOSIS — Z950 Presence of cardiac pacemaker: Secondary | ICD-10-CM

## 2020-04-29 DIAGNOSIS — R0602 Shortness of breath: Secondary | ICD-10-CM

## 2020-04-29 DIAGNOSIS — N19 Unspecified kidney failure: Secondary | ICD-10-CM

## 2020-04-29 DIAGNOSIS — B379 Candidiasis, unspecified: Secondary | ICD-10-CM | POA: Diagnosis present

## 2020-04-29 DIAGNOSIS — Z933 Colostomy status: Secondary | ICD-10-CM

## 2020-04-29 DIAGNOSIS — E875 Hyperkalemia: Secondary | ICD-10-CM | POA: Diagnosis not present

## 2020-04-29 DIAGNOSIS — G901 Familial dysautonomia [Riley-Day]: Secondary | ICD-10-CM | POA: Diagnosis not present

## 2020-04-29 DIAGNOSIS — E8809 Other disorders of plasma-protein metabolism, not elsewhere classified: Secondary | ICD-10-CM | POA: Diagnosis present

## 2020-04-29 DIAGNOSIS — E669 Obesity, unspecified: Secondary | ICD-10-CM | POA: Diagnosis present

## 2020-04-29 DIAGNOSIS — E1122 Type 2 diabetes mellitus with diabetic chronic kidney disease: Secondary | ICD-10-CM | POA: Diagnosis present

## 2020-04-29 DIAGNOSIS — G4700 Insomnia, unspecified: Secondary | ICD-10-CM | POA: Diagnosis present

## 2020-04-29 DIAGNOSIS — E861 Hypovolemia: Secondary | ICD-10-CM | POA: Diagnosis present

## 2020-04-29 DIAGNOSIS — M7989 Other specified soft tissue disorders: Secondary | ICD-10-CM | POA: Diagnosis not present

## 2020-04-29 HISTORY — DX: Gastro-esophageal reflux disease without esophagitis: K21.9

## 2020-04-29 HISTORY — DX: Anemia in other chronic diseases classified elsewhere: D63.8

## 2020-04-29 HISTORY — DX: Type 2 diabetes mellitus without complications: E11.9

## 2020-04-29 HISTORY — DX: Unspecified viral hepatitis C without hepatic coma: B19.20

## 2020-04-29 HISTORY — DX: Unspecified mycosis: B49

## 2020-04-29 HISTORY — DX: Long term (current) use of insulin: Z79.4

## 2020-04-29 LAB — URINALYSIS, ROUTINE W REFLEX MICROSCOPIC
Bilirubin Urine: NEGATIVE
Glucose, UA: NEGATIVE mg/dL
Ketones, ur: NEGATIVE mg/dL
Nitrite: NEGATIVE
Protein, ur: 100 mg/dL — AB
RBC / HPF: >50 RBC/hpf — ABNORMAL HIGH (ref 0–5)
Specific Gravity, Urine: 1.013 (ref 1.005–1.030)
WBC, UA: >50 WBC/hpf — ABNORMAL HIGH (ref 0–5)
pH: 5 (ref 5.0–8.0)

## 2020-04-29 LAB — I-STAT CHEM 8, ED
BUN: 61 mg/dL — ABNORMAL HIGH (ref 6–20)
Calcium, Ion: 0.87 mmol/L — CL (ref 1.15–1.40)
Chloride: 105 mmol/L (ref 98–111)
Creatinine, Ser: 2 mg/dL — ABNORMAL HIGH (ref 0.61–1.24)
Glucose, Bld: 180 mg/dL — ABNORMAL HIGH (ref 70–99)
HCT: 16 % — ABNORMAL LOW (ref 39.0–52.0)
Hemoglobin: 5.4 g/dL — CL (ref 13.0–17.0)
Potassium: 2.9 mmol/L — ABNORMAL LOW (ref 3.5–5.1)
Sodium: 137 mmol/L (ref 135–145)
TCO2: 18 mmol/L — ABNORMAL LOW (ref 22–32)

## 2020-04-29 LAB — CBC WITH DIFFERENTIAL/PLATELET
Abs Immature Granulocytes: 0.11 10*3/uL — ABNORMAL HIGH (ref 0.00–0.07)
Basophils Absolute: 0 10*3/uL (ref 0.0–0.1)
Basophils Relative: 0 %
Eosinophils Absolute: 0 10*3/uL (ref 0.0–0.5)
Eosinophils Relative: 0 %
HCT: 22.4 % — ABNORMAL LOW (ref 39.0–52.0)
Hemoglobin: 7.2 g/dL — ABNORMAL LOW (ref 13.0–17.0)
Immature Granulocytes: 1 %
Lymphocytes Relative: 7 %
Lymphs Abs: 1.1 10*3/uL (ref 0.7–4.0)
MCH: 29.4 pg (ref 26.0–34.0)
MCHC: 32.1 g/dL (ref 30.0–36.0)
MCV: 91.4 fL (ref 80.0–100.0)
Monocytes Absolute: 0.5 10*3/uL (ref 0.1–1.0)
Monocytes Relative: 3 %
Neutro Abs: 13.8 10*3/uL — ABNORMAL HIGH (ref 1.7–7.7)
Neutrophils Relative %: 89 %
Platelets: 179 10*3/uL (ref 150–400)
RBC: 2.45 MIL/uL — ABNORMAL LOW (ref 4.22–5.81)
RDW: 15 % (ref 11.5–15.5)
WBC: 15.5 10*3/uL — ABNORMAL HIGH (ref 4.0–10.5)
nRBC: 0 % (ref 0.0–0.2)

## 2020-04-29 LAB — COMPREHENSIVE METABOLIC PANEL
ALT: 9 U/L (ref 0–44)
AST: 20 U/L (ref 15–41)
Albumin: <1 g/dL — ABNORMAL LOW (ref 3.5–5.0)
Alkaline Phosphatase: 85 U/L (ref 38–126)
Anion gap: 9 (ref 5–15)
BUN: 42 mg/dL — ABNORMAL HIGH (ref 6–20)
CO2: 13 mmol/L — ABNORMAL LOW (ref 22–32)
Calcium: 5 mg/dL — CL (ref 8.9–10.3)
Chloride: 110 mmol/L (ref 98–111)
Creatinine, Ser: 1.79 mg/dL — ABNORMAL HIGH (ref 0.61–1.24)
GFR, Estimated: 45 mL/min — ABNORMAL LOW (ref >60)
Glucose, Bld: 258 mg/dL — ABNORMAL HIGH (ref 70–99)
Potassium: 2.5 mmol/L — CL (ref 3.5–5.1)
Sodium: 132 mmol/L — ABNORMAL LOW (ref 135–145)
Total Bilirubin: 0.6 mg/dL (ref 0.3–1.2)
Total Protein: 3.9 g/dL — ABNORMAL LOW (ref 6.5–8.1)

## 2020-04-29 LAB — LACTIC ACID, PLASMA: Lactic Acid, Venous: 0.7 mmol/L (ref 0.5–1.9)

## 2020-04-29 LAB — RESP PANEL BY RT-PCR (FLU A&B, COVID) ARPGX2
Influenza A by PCR: NEGATIVE
Influenza B by PCR: NEGATIVE
SARS Coronavirus 2 by RT PCR: NEGATIVE

## 2020-04-29 LAB — I-STAT VENOUS BLOOD GAS, ED
Acid-base deficit: 7 mmol/L — ABNORMAL HIGH (ref 0.0–2.0)
Bicarbonate: 16.5 mmol/L — ABNORMAL LOW (ref 20.0–28.0)
Calcium, Ion: 0.86 mmol/L — CL (ref 1.15–1.40)
HCT: 15 % — ABNORMAL LOW (ref 39.0–52.0)
Hemoglobin: 5.1 g/dL — CL (ref 13.0–17.0)
O2 Saturation: 100 %
Potassium: 3 mmol/L — ABNORMAL LOW (ref 3.5–5.1)
Sodium: 136 mmol/L (ref 135–145)
TCO2: 17 mmol/L — ABNORMAL LOW (ref 22–32)
pCO2, Ven: 23.1 mmHg — ABNORMAL LOW (ref 44.0–60.0)
pH, Ven: 7.462 — ABNORMAL HIGH (ref 7.250–7.430)
pO2, Ven: 179 mmHg — ABNORMAL HIGH (ref 32.0–45.0)

## 2020-04-29 LAB — PROCALCITONIN: Procalcitonin: 34.8 ng/mL

## 2020-04-29 LAB — BRAIN NATRIURETIC PEPTIDE: B Natriuretic Peptide: 1484.4 pg/mL — ABNORMAL HIGH (ref 0.0–100.0)

## 2020-04-29 LAB — GLUCOSE, CAPILLARY
Glucose-Capillary: 84 mg/dL (ref 70–99)
Glucose-Capillary: 78 mg/dL (ref 70–99)

## 2020-04-29 LAB — PROTIME-INR
INR: 1.4 — ABNORMAL HIGH (ref 0.8–1.2)
Prothrombin Time: 16.7 seconds — ABNORMAL HIGH (ref 11.4–15.2)

## 2020-04-29 LAB — APTT: aPTT: 30 seconds (ref 24–36)

## 2020-04-29 LAB — ABO/RH: ABO/RH(D): B POS

## 2020-04-29 LAB — PREPARE RBC (CROSSMATCH)

## 2020-04-29 LAB — HIV ANTIBODY (ROUTINE TESTING W REFLEX): HIV Screen 4th Generation wRfx: NONREACTIVE

## 2020-04-29 LAB — PHOSPHORUS: Phosphorus: 7.5 mg/dL — ABNORMAL HIGH (ref 2.5–4.6)

## 2020-04-29 LAB — TROPONIN I (HIGH SENSITIVITY)
Troponin I (High Sensitivity): 15 ng/L (ref <18)
Troponin I (High Sensitivity): 31 ng/L — ABNORMAL HIGH (ref <18)

## 2020-04-29 LAB — MAGNESIUM
Magnesium: 1 mg/dL — ABNORMAL LOW (ref 1.7–2.4)
Magnesium: 1.8 mg/dL (ref 1.7–2.4)

## 2020-04-29 LAB — CBG MONITORING, ED: Glucose-Capillary: 270 mg/dL — ABNORMAL HIGH (ref 70–99)

## 2020-04-29 MED ORDER — CHLORHEXIDINE GLUCONATE 0.12% ORAL RINSE (MEDLINE KIT)
15.0000 mL | Freq: Two times a day (BID) | OROMUCOSAL | Status: DC
  Administered 2020-04-29 – 2020-06-05 (×60): 15 mL via OROMUCOSAL

## 2020-04-29 MED ORDER — LORAZEPAM 2 MG/ML IJ SOLN
0.5000 mg | Freq: Four times a day (QID) | INTRAMUSCULAR | Status: DC | PRN
  Administered 2020-04-29 – 2020-05-02 (×7): 0.5 mg via INTRAVENOUS
  Filled 2020-04-29 (×7): qty 1

## 2020-04-29 MED ORDER — POTASSIUM CHLORIDE 10 MEQ/100ML IV SOLN
10.0000 meq | INTRAVENOUS | Status: AC
  Administered 2020-04-29 (×3): 10 meq via INTRAVENOUS
  Filled 2020-04-29 (×3): qty 100

## 2020-04-29 MED ORDER — ORAL CARE MOUTH RINSE
15.0000 mL | OROMUCOSAL | Status: DC
  Administered 2020-04-29 – 2020-06-04 (×220): 15 mL via OROMUCOSAL

## 2020-04-29 MED ORDER — POLYETHYLENE GLYCOL 3350 17 G PO PACK: 17.0000 g | PACK | Freq: Every day | ORAL | Status: DC | PRN

## 2020-04-29 MED ORDER — CHLORHEXIDINE GLUCONATE CLOTH 2 % EX PADS
6.0000 | MEDICATED_PAD | Freq: Every day | CUTANEOUS | Status: DC
  Administered 2020-04-29 – 2020-06-03 (×34): 6 via TOPICAL

## 2020-04-29 MED ORDER — CALCIUM GLUCONATE-NACL 2-0.675 GM/100ML-% IV SOLN
2.0000 g | Freq: Once | INTRAVENOUS | Status: AC
  Administered 2020-04-29: 2000 mg via INTRAVENOUS
  Filled 2020-04-29 (×2): qty 100

## 2020-04-29 MED ORDER — ACETAMINOPHEN 160 MG/5ML PO SOLN
1000.0000 mg | Freq: Once | ORAL | Status: AC
  Administered 2020-04-29: 1000 mg
  Filled 2020-04-29: qty 40.6

## 2020-04-29 MED ORDER — FENTANYL CITRATE (PF) 100 MCG/2ML IJ SOLN
25.0000 ug | INTRAMUSCULAR | Status: DC | PRN
  Administered 2020-04-30 – 2020-06-03 (×117): 25 ug via INTRAVENOUS
  Filled 2020-04-29 (×120): qty 2

## 2020-04-29 MED ORDER — INSULIN ASPART 100 UNIT/ML ~~LOC~~ SOLN
0.0000 [IU] | SUBCUTANEOUS | Status: DC
  Administered 2020-05-01: 1 [IU] via SUBCUTANEOUS
  Administered 2020-05-01: 0 [IU] via SUBCUTANEOUS
  Administered 2020-05-05 – 2020-05-15 (×18): 1 [IU] via SUBCUTANEOUS

## 2020-04-29 MED ORDER — NOREPINEPHRINE 4 MG/250ML-% IV SOLN
0.0000 ug/min | INTRAVENOUS | Status: DC
  Administered 2020-04-29 (×2): 3 ug/min via INTRAVENOUS
  Administered 2020-04-30: 1 ug/min via INTRAVENOUS
  Filled 2020-04-29 (×2): qty 250

## 2020-04-29 MED ORDER — PANTOPRAZOLE SODIUM 40 MG IV SOLR: 40.0000 mg | Freq: Every day | INTRAVENOUS | Status: DC

## 2020-04-29 MED ORDER — MAGNESIUM SULFATE 2 GM/50ML IV SOLN
2.0000 g | Freq: Once | INTRAVENOUS | Status: AC
  Administered 2020-04-29: 2 g via INTRAVENOUS
  Filled 2020-04-29: qty 50

## 2020-04-29 MED ORDER — ONDANSETRON HCL 4 MG/2ML IJ SOLN
4.0000 mg | Freq: Four times a day (QID) | INTRAMUSCULAR | Status: DC | PRN
  Administered 2020-04-29 – 2020-04-30 (×2): 4 mg via INTRAVENOUS
  Filled 2020-04-29 (×2): qty 2

## 2020-04-29 MED ORDER — SODIUM CHLORIDE 0.9 % IV SOLN
10.0000 mL/h | Freq: Once | INTRAVENOUS | Status: AC
  Administered 2020-05-12 (×2): 10 mL/h via INTRAVENOUS

## 2020-04-29 MED ORDER — FENTANYL 75 MCG/HR TD PT72
1.0000 | MEDICATED_PATCH | TRANSDERMAL | Status: DC
  Administered 2020-04-30 – 2020-05-14 (×6): 1 via TRANSDERMAL
  Filled 2020-04-29 (×6): qty 1

## 2020-04-29 MED ORDER — VANCOMYCIN VARIABLE DOSE PER UNSTABLE RENAL FUNCTION (PHARMACIST DOSING): Status: DC

## 2020-04-29 MED ORDER — FENTANYL CITRATE (PF) 100 MCG/2ML IJ SOLN
50.0000 ug | Freq: Once | INTRAMUSCULAR | Status: AC
Start: 2020-04-29 — End: 2020-04-29
  Administered 2020-04-29: 50 ug via INTRAVENOUS
  Filled 2020-04-29: qty 2

## 2020-04-29 MED ORDER — VANCOMYCIN HCL 2000 MG/400ML IV SOLN
2000.0000 mg | Freq: Once | INTRAVENOUS | Status: AC
  Administered 2020-04-29: 2000 mg via INTRAVENOUS
  Filled 2020-04-29: qty 400

## 2020-04-29 MED ORDER — LACTATED RINGERS IV SOLN: INTRAVENOUS | Status: DC

## 2020-04-29 MED ORDER — SODIUM CHLORIDE 0.9 % IV SOLN
2.0000 g | Freq: Two times a day (BID) | INTRAVENOUS | Status: DC
  Administered 2020-04-30: 2 g via INTRAVENOUS
  Filled 2020-04-29: qty 2

## 2020-04-29 MED ORDER — LACTATED RINGERS IV BOLUS
1000.0000 mL | Freq: Once | INTRAVENOUS | Status: AC
  Administered 2020-04-29: 1000 mL via INTRAVENOUS

## 2020-04-29 MED ORDER — SODIUM CHLORIDE 0.9 % IV SOLN
2.0000 g | Freq: Once | INTRAVENOUS | Status: AC
  Administered 2020-04-29: 2 g via INTRAVENOUS
  Filled 2020-04-29: qty 2

## 2020-04-29 MED ORDER — PANTOPRAZOLE SODIUM 40 MG IV SOLR
40.0000 mg | Freq: Two times a day (BID) | INTRAVENOUS | Status: DC
  Administered 2020-04-29 – 2020-05-01 (×4): 40 mg via INTRAVENOUS
  Filled 2020-04-29 (×4): qty 40

## 2020-04-29 MED ORDER — DOCUSATE SODIUM 100 MG PO CAPS: 100.0000 mg | ORAL_CAPSULE | Freq: Two times a day (BID) | ORAL | Status: DC | PRN

## 2020-04-29 NOTE — Progress Notes (Signed)
Following for code sepsis 

## 2020-04-29 NOTE — Progress Notes (Signed)
Pt transported to ct and back with no complications. 

## 2020-04-29 NOTE — ED Notes (Signed)
EDP notified of critical lab values for K+ and Ca

## 2020-04-29 NOTE — Progress Notes (Signed)
[facility]Link Physician-Brief Progress Note Patient Name: Phillip Klein DOB: 12-04-1965 MRN: [id number]   Date of Service  May 15, 2020  HPI/Events of Note  Patient admitted with a history of quadriplegia following cervical spine surgery for epidural abscess, tracheostomy and PEG, multiple wounds, hypotension and fever, r/o  Sepsis with shock.  eICU Interventions  New Patient Evaluation completed, PCCM ground team requested to place a central line for vascular access.                 Migdalia Dk May 15, 2020, 8:49 PM

## 2020-04-29 NOTE — Progress Notes (Signed)
eLink Physician-Brief Progress Note Patient Name: Phillip Klein DOB: 1965-05-26 MRN: [id number]   Date of Service  04/23/2020  HPI/Events of Note  Patient asking to have the Fentanyl patches he is on at home continued.  eICU Interventions  Fentanyl 150 mcg patch Q 72 hours ordered.        Phillip Klein 04/14/2020, 11:01 PM

## 2020-04-29 NOTE — ED Triage Notes (Addendum)
Pt BIB [facility] from [facility]. Pt was at [facility] for J-tube replacement, noticed to be hypotensive with systolic in 70s dropping down to 50s. Levo started at down to on arrival. Pt quadriplegic from back surgery, on a trach vent

## 2020-04-29 NOTE — Progress Notes (Signed)
Per ED RN blood cultures drawn before antibiotics given 

## 2020-04-29 NOTE — H&P (Signed)
NAME:  Phillip Klein, MRN:  [id number], DOB:  1966-02-14, LOS: 0 ADMISSION DATE:  2020-05-01, CONSULTATION DATE:  05-01-20 REFERRING MD:  Dr. Silverio Lay, CHIEF COMPLAINT:  Sepsis    Brief History:  54yo male who presented from [facility] for evaluation of hypotension. PCCM consulted for further management of sepsis and hypotension.    History of Present Illness:  Phillip Klein is a 55 y.o. male with past medical history significant chronic vent dependence via trach after prolonged hospitalization at [facility] where he underwent spinal surgery unfortunately became quadriplegic. Per report patient was scheduled to have revision to J-tube today and was found hypotensive, SBP dropped as low as 50's resulting in need to initiation of fluid bolus and pressor support. Additional history includes back and leg pain.   On arrival patient was seen febrile, tachypnea, and hypotension. Labwork with significant lab abnormalities including; Na 132, K 2.5, Co2 13, glucose 258, creatine 1.79, calcium 5.0, Mg 1.0, albumin <1.0. CBC pending at time of assessment. CXR concerning for LLL collapse concerning for aspiration or PNA. Given need for chronic trach with pressor need PCCM consulted for further management and admission.  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 Aspiration or pneumonia is not excluded. Recommend follow-up to resolution. Possible small left pleural effusion.   Micro Data:  Respiratory panel 2/24 > Blood  culture 2/24 > Urine culture 2/24  Antimicrobials:  Cefepime 2/24 Vancomycin 2/24  Interim History / Subjective:  Seen lying in bed on trach   Objective   Blood pressure (!) 83/61, pulse 80, temperature (!) 103 F (39.4 C), temperature source Oral, resp. rate (!) 26, height 6\' 2"  (1.88 m), weight 107.5 kg, SpO2 100 %.    Vent Mode: PRVC FiO2 (%):  [70 %] 70 % Set Rate:  [26 bmp] 26 bmp Vt Set:  [510 mL] 510 mL PEEP:  [10 cmH20] 10 cmH20 Plateau Pressure:  [23 cmH20] 23 cmH20  No intake or output data in the 24 hours ending 01-May-2020 1747 Filed Weights   05-01-2020 1610  Weight: 107.5 kg    Examination: General: Chronically ill appearing deconditioned elderly male lying in bed on vent via trach on mechanical ventilation, in NAD HEENT: midline trach, MM pink/moist, PERRL,  Neuro: Grimace to pain, unable to follow commands    CV: s1s2 regular rate and rhythm, no murmur, rubs, or gallops,  PULM:  Bilateral rhonchi, tolerating vent well, no increased work of breathing  GI: soft, bowel sounds active in all 4 quadrants, non-tender, non-distended Extremities: warm/dry, massive volume overload with 3/4 pitting edema tracking up to lower abdomin and bilateral upper extremities  Skin: no rashes or lesions  Resolved Hospital Problem list     Assessment & Plan:  Sepsis shock  -Patients mets criteria for sepsis with temperature 103, RR 26, WBC pending with suspected source of infection  -Patient has a history of MRSA bacteremia -Multiple site for possible infection source   P: Admit ICU Supplemental oxygen via vent as below  1 liter fluid now  Pan cultures prior to antibiotic IV  antibiotics vanc and cefepime  Continue pressors  MAP goal < 65  Trend lactic acid Monitor urine output  Chronic hypoxic respiratory failure  -Trach depend since becoming quadriplegic  P: Continue ventilator support with lung protective strategies  Routine trach care  Wean PEEP and FiO2 for sats  greater than 90%. Head of bed elevated 30 degrees. Plateau pressures less than 30 cm H20.  Follow intermittent chest x-ray and ABG.   Ensure adequate pulmonary hygiene  Follow cultures  VAP bundle in place  PAD protocol  Quadriplegia with history of spinal epidural abscess C2-C7 and T9-L4 Osteomyelitis of cervical spine  -Patient has underwent C2-T2 posterior instrumented fusion, C2-C3, C3-C4, C4-C5, C5-C6, C7-T1 laminectomies and C2-T2 posterolateral arthrodesis with autograft and allograft at [facility] P: Supportive care   Multiple pressure ulcers  Open abdominal wound  -S/P ex lap for G-tube malfunction  P: WOC consult  Wound care  Pressure alleviating devices  Optimize nutrition  HFrEF -EF of 30% on ECHO at [facility]  P: Strict monitoring of volume status  Appears intravascularly drive  Repeat ECHO  Strict I&O  Hypoalbuminemia  -Albumin <1.0 Malnutrition P: Tube feeds  Protein supplementation   Anemia of chronic disease  -Hgb on I-stat 5.4 P: Transfuse per protocol  Trend CBC   Best practice (evaluated daily)  Diet: TF Pain/Anxiety/Delirium protocol (if indicated): As needed  VAP protocol (if indicated): In place  DVT prophylaxis: SCD GI prophylaxis: PPI Glucose control: SSI Mobility: Bedrest  Disposition:ICU  Goals of Care:  Last date of multidisciplinary goals of care discussion:Pending  Family and staff present: Pending  Summary of discussion: Pending  Follow up goals of care discussion due: Pending  Code Status: ICU   Labs   CBC: Recent Labs  Lab 04/13/2020 1639 04/27/2020 1640  HGB 5.1* 5.4*  HCT 15.0* 16.0*    Basic Metabolic Panel: Recent Labs  Lab 04/30/2020 1606 04/27/2020 1639 04/21/2020 1640  NA 132* 136 137  K 2.5* 3.0* 2.9*  CL 110  --  105  CO2 13*  --   --   GLUCOSE 258*  --  180*  BUN 42*  --  61*  CREATININE 1.79*  --  2.00*  CALCIUM 5.0*  --   --   MG 1.0*  --   --    GFR: Estimated Creatinine Clearance: 56.4 mL/min (A) (by  C-G formula based on SCr of 2 mg/dL (H)). Recent Labs  Lab 04/20/2020 1606  LATICACIDVEN 0.7    Liver Function Tests: Recent Labs  Lab 04/10/2020 1606  AST 20  ALT 9  ALKPHOS 85  BILITOT 0.6  PROT 3.9*  ALBUMIN <1.0*   No results for input(s): LIPASE, AMYLASE in the last 168 hours. No results for input(s): AMMONIA in the last 168 hours.  ABG    Component Value Date/Time   HCO3 16.5 (L) 04/17/2020 1639   TCO2 18 (L) 04/15/2020 1640   ACIDBASEDEF 7.0 (H) 04/19/2020 1639   O2SAT 100.0 04/07/2020 1639     Coagulation Profile: No results for input(s): INR, PROTIME in the last 168 hours.  Cardiac Enzymes: No results for input(s): CKTOTAL, CKMB, CKMBINDEX, TROPONINI in the last 168 hours.  HbA1C: No results found for: HGBA1C  CBG: Recent Labs  Lab 04/26/2020 1623  GLUCAP 270*    Review of Systems:   Unable to assess   Past Medical History:  He,  has no past medical history on file.   Surgical History:  History reviewed. No pertinent surgical history.  Social History:      Family History:  His family history is not on file.   Allergies Allergies  Allergen Reactions  . Acetaminophen Other (See Comments)    "SHUTS DOWN MY ORGANS"  . Gabapentin Other (See Comments)    SUICIDAL IDEATION- Suicidal thoughts and actions      Home Medications  Prior to Admission medications   Not on File     Critical care time:   CRITICAL CARE Performed by: Delfin Gant  Total critical care time: 50 minutes  Critical care time was exclusive of separately billable procedures and treating other patients.  Critical care was necessary to treat or prevent imminent or life-threatening deterioration.  Critical care was time spent personally by me on the following activities: development of treatment plan with patient and/or surrogate as well as nursing, discussions with consultants, evaluation of patient's response to treatment, examination of patient, obtaining history  from patient or surrogate, ordering and performing treatments and interventions, ordering and review of laboratory studies, ordering and review of radiographic studies, pulse oximetry and re-evaluation of patient's condition.  Delfin Gant, NP-C [facility] Personal contact information can be found on Amion  If no response please page: Adult pulmonary and critical care medicine pager on Amion unitl 7pm After 7pm please call [phone] 04/22/2020, 7:05 PM

## 2020-04-29 NOTE — Progress Notes (Signed)
Pharmacy Antibiotic Note  Phillip Klein is a 55 y.o. male admitted on 04/20/2020 with sepsis.  Pharmacy has been consulted for Cefepime and Vancomycin dosing.   Height: 6\' 2"  (188 cm) Weight: 107.5 kg (236 lb 15.9 oz) IBW/kg (Calculated) : 82.2  Temp (24hrs), Avg:103 F (39.4 C), Min:103 F (39.4 C), Max:103 F (39.4 C)  Recent Labs  Lab 04/08/2020 1606 04/12/2020 1640  CREATININE 1.79* 2.00*  LATICACIDVEN 0.7  --     Estimated Creatinine Clearance: 56.4 mL/min (A) (by C-G formula based on SCr of 2 mg/dL (H)).    Allergies  Allergen Reactions  . Acetaminophen Other (See Comments)    "SHUTS DOWN MY ORGANS"  . Gabapentin Other (See Comments)    SUICIDAL IDEATION- Suicidal thoughts and actions     Antimicrobials this admission: 2/24 Cefepime >>  2/24 Vancomycin >>   Dose adjustments this admission: N/a  Microbiology results: Pending   Plan:  - Cefepime 2g IV q12h - Vancomycin 2000mg  IV x 1 dose  - Will dose vancomycin based on random levels due to AKI. Scr in 11/2019 was 0.3 currently 2.0 - Monitor patients renal function and urine output  - De-escalate ABX when appropriate   Thank you for allowing pharmacy to be a part of this patient's care.  PharmD. BCPS 05/02/2020 6:53 PM

## 2020-04-29 NOTE — Progress Notes (Signed)
Notified provider of need to order fluid bolus, pt needs 3225 cc per protocol, bedside RN aware.

## 2020-04-29 NOTE — ED Provider Notes (Signed)
[place] EMERGENCY DEPARTMENT Provider Note   CSN: [id number] Arrival date & time: 04/19/2020  1540     History Chief Complaint  Patient presents with   Hypotension    Phillip Klein is a 55 y.o. male presents via EMS for concern of new hypertension at [facility].  Patient was new patient at [facility] on 04/27/2020, transferred from [facility] ICU following spinal surgery at which time he unfortunately became quadriplegic.  Per EMS, patient underwent J-tube replacement today, and was noted to be hypotensive upon return to the floor with systolic in the 70D dropping as low to the 50s.  Patient received 1 L of fluid by [facility], however appears very overloaded by EMS and was started on Levophed 10 mcg, titrated down to 3 mcg with systolic in the 643C at time of arrival to the ED.  Patient is on trach and chronic settings for ventilator.  For EMS patient is endorsing pain in his back and his left leg.  Additionally it appears patient has history of IV drug use, chronic pain.  Hemoglobin of 6.9 at the facility, white count of 5.9.  LEVEL 5 Caveat due to acuity of patient presentation and nonverbal patient with trach.  HPI     History reviewed. No pertinent past medical history.  Patient Active Problem List   Diagnosis Date Noted   Septic shock ([place]) 04/14/2020    History reviewed. No pertinent surgical history.     History reviewed. No pertinent family history.     Home Medications Prior to Admission medications   Medication Sig Start Date End Date Taking? Authorizing Provider  acetaminophen (TYLENOL) 325 MG tablet Place 650 mg into feeding tube every 6 (six) hours as needed for mild pain.   Yes [provider]  alum & mag hydroxide-simeth (MAG-AL PLUS) 200-200-20 MG/5ML suspension Place 30 mLs into feeding tube every 6 (six) hours as needed (GERD WITH ESOPHAGITIS WITHOUT BLEEDING).   Yes [provider]  amiodarone (PACERONE) 200  MG tablet Place 200 mg into feeding tube in the morning.   Yes [provider]  amLODipine (NORVASC) 5 MG tablet Place 5 mg into feeding tube daily.   Yes [provider]  chlorhexidine (PERIDEX) 0.12 % solution 15 mLs by Mouth Rinse route 2 (two) times daily.   Yes [provider]  clonazePAM (KLONOPIN) 0.5 MG tablet Place 0.5 mg into feeding tube every 6 (six) hours as needed (for generalized anxiety disorder).   Yes [provider]  fentaNYL (DURAGESIC) 100 MCG/HR Place 1 patch onto the skin every 3 (three) days.   Yes [provider]  fentaNYL (DURAGESIC) 50 MCG/HR Place 1 patch onto the skin every 3 (three) days.   Yes [provider]  ferrous sulfate 220 (44 Fe) MG/5ML solution Place 220 mg into feeding tube in the morning and at bedtime.   Yes [provider]  fludrocortisone (FLORINEF) 0.1 MG tablet 0.2 mg See admin instructions. 0.2 mg, per G-Tube, every twelve hours   Yes [provider]  furosemide (LASIX) 40 MG tablet Place 40 mg into feeding tube in the morning.   Yes [provider]  ipratropium-albuterol (DUONEB) 0.5-2.5 (3) MG/3ML SOLN Take 3 mLs by nebulization every 4 (four) hours as needed (for acute and chronic respiratory failure with hypercapnia).   Yes [provider]  lansoprazole (PREVACID SOLUTAB) 30 MG disintegrating tablet Place 30 mg into feeding tube in the morning.   Yes [provider]  levothyroxine (  SYNTHROID) 125 MCG tablet Place 125 mcg into feeding tube in the morning.   Yes [provider]  loperamide (IMODIUM A-D) 2 MG tablet Place 2 mg into feeding tube every 8 (eight) hours as needed for diarrhea or loose stools.   Yes [provider]  Nutritional Supplements (FEEDING SUPPLEMENT, VITAL 1.5 CAL,) LIQD Place 50 mL/hr into feeding tube continuous.   Yes [provider]  ondansetron (ZOFRAN-ODT) 4 MG disintegrating tablet 4 mg See admin  instructions. 4 mg, per G-Tube, every six hours as needed for nausea   Yes [provider]  Oxycodone HCl 10 MG TABS Place 10 mg into feeding tube every 6 (six) hours as needed (for severe pain).   Yes [provider]  PRESCRIPTION MEDICATION Inject 42 mL/hr into the vein See admin instructions. D10W (dextrose 10% in water): 42 ml's/hr "by shift"   Yes [provider]  sertraline (ZOLOFT) 100 MG tablet Place 100 mg into feeding tube daily.   Yes [provider]  Sodium Chloride Flush (NORMAL SALINE FLUSH) 0.9 % SOLN Inject 10 mLs into the vein See admin instructions. Flush with 10 ml's of normal saline before and after medications   Yes [provider]  traZODone (DESYREL) 100 MG tablet Place 100 mg into feeding tube at bedtime.   Yes [provider]    Allergies    Acetaminophen and Gabapentin  Review of Systems   Review of Systems  Unable to perform ROS: Patient nonverbal    Physical Exam Updated Vital Signs BP (!) 103/52    Pulse 80    Temp (!) 103 F (39.4 C) (Oral)    Resp (!) 26    Ht '6\' 2"'  (1.88 m)    Wt 107.5 kg    SpO2 100%    BMI 30.43 kg/m   Physical Exam Vitals and nursing note reviewed. Exam conducted with a chaperone present.  Constitutional:      Appearance: He is obese. He is toxic-appearing.     Comments: Tracheostomy in place, on vent  HENT:     Head: Normocephalic and atraumatic.     Nose: Nose normal.     Mouth/Throat:     Mouth: Mucous membranes are moist.     Pharynx: Oropharynx is clear. Uvula midline. No oropharyngeal exudate or posterior oropharyngeal erythema.     Tonsils: No tonsillar exudate.     Comments: No teeth Eyes:     General: Lids are normal. Vision grossly intact.        Right eye: No discharge.        Left eye: No discharge.     Extraocular Movements: Extraocular movements intact.     Conjunctiva/sclera: Conjunctivae normal.     Pupils: Pupils are equal, round, and reactive to light.   Neck:     Trachea: Tracheostomy present.  Cardiovascular:     Rate and Rhythm: Normal rate and regular rhythm.     Pulses:          Radial pulses are 2+ on the right side and 2+ on the left side.       Dorsalis pedis pulses are 1+ on the right side and 1+ on the left side.     Heart sounds: Normal heart sounds.     Comments: And patient appears extremely fluid overloaded with 4+ pitting edema of the lower extremities as well as 2+ to 3+ pitting edema of the upper extremities including the digits of the hands.  Anasarca. Pulmonary:  Effort: Pulmonary effort is normal. No respiratory distress.     Breath sounds: Examination of the left-middle field reveals rales. Examination of the right-lower field reveals rales. Examination of the left-lower field reveals decreased breath sounds and rales. Decreased breath sounds and rales present. No wheezing.  Chest:     Chest wall: No deformity, swelling, tenderness, crepitus or edema.    Abdominal:     General: Bowel sounds are normal. There is no distension.     Palpations: Abdomen is soft.     Tenderness: There is generalized abdominal tenderness.       Comments: Patient with multiple decubitus ulcers in his abdomen and pelvis.  Genitourinary:      Comments: Significant scrotal edema and firmness to palpation.  Foley catheter in place. Musculoskeletal:        General: No deformity.     Cervical back: Neck supple. No crepitus. No pain with movement, spinous process tenderness or muscular tenderness.     Right lower leg: 4+ Pitting Edema present.     Left lower leg: 4+ Pitting Edema present.  Lymphadenopathy:     Cervical: No cervical adenopathy.  Skin:    General: Skin is warm and dry.     Capillary Refill: Capillary refill takes less than 2 seconds.  Neurological:     General: No focal deficit present.     Mental Status: He is alert and oriented to person, place, and time. Mental status is at baseline.     Comments: Patient is  quadriplegic at baseline since 08/2019.  Psychiatric:        Mood and Affect: Mood normal.     ED Results / Procedures / Treatments   Labs (all labs ordered are listed, but only abnormal results are displayed) Labs Reviewed  COMPREHENSIVE METABOLIC PANEL - Abnormal; Notable for the following components:      Result Value   Sodium 132 (*)    Potassium 2.5 (*)    CO2 13 (*)    Glucose, Bld 258 (*)    BUN 42 (*)    Creatinine, Ser 1.79 (*)    Calcium 5.0 (*)    Total Protein 3.9 (*)    Albumin <1.0 (*)    GFR, Estimated 45 (*)    All other components within normal limits  URINALYSIS, ROUTINE W REFLEX MICROSCOPIC - Abnormal; Notable for the following components:   Color, Urine AMBER (*)    APPearance CLOUDY (*)    Hgb urine dipstick LARGE (*)    Protein, ur 100 (*)    Leukocytes,Ua LARGE (*)    RBC / HPF >50 (*)    WBC, UA >50 (*)    Bacteria, UA RARE (*)    All other components within normal limits  MAGNESIUM - Abnormal; Notable for the following components:   Magnesium 1.0 (*)    All other components within normal limits  I-STAT VENOUS BLOOD GAS, ED - Abnormal; Notable for the following components:   pH, Ven 7.462 (*)    pCO2, Ven 23.1 (*)    pO2, Ven 179.0 (*)    Bicarbonate 16.5 (*)    TCO2 17 (*)    Acid-base deficit 7.0 (*)    Potassium 3.0 (*)    Calcium, Ion 0.86 (*)    HCT 15.0 (*)    Hemoglobin 5.1 (*)    All other components within normal limits  CBG MONITORING, ED - Abnormal; Notable for the following components:   Glucose-Capillary 270 (*)  All other components within normal limits  I-STAT CHEM 8, ED - Abnormal; Notable for the following components:   Potassium 2.9 (*)    BUN 61 (*)    Creatinine, Ser 2.00 (*)    Glucose, Bld 180 (*)    Calcium, Ion 0.87 (*)    TCO2 18 (*)    Hemoglobin 5.4 (*)    HCT 16.0 (*)    All other components within normal limits  RESP PANEL BY RT-PCR (FLU A&B, COVID) ARPGX2  URINE CULTURE  CULTURE, BLOOD (ROUTINE X 2)   CULTURE, BLOOD (ROUTINE X 2)  MRSA PCR SCREENING  LACTIC ACID, PLASMA  GLUCOSE, CAPILLARY  CBC WITH DIFFERENTIAL/PLATELET  PROTIME-INR  APTT  CBC WITH DIFFERENTIAL/PLATELET  HIV ANTIBODY (ROUTINE TESTING W REFLEX)  CBC  MAGNESIUM  PHOSPHORUS  MAGNESIUM  PHOSPHORUS  PROCALCITONIN  BRAIN NATRIURETIC PEPTIDE  BASIC METABOLIC PANEL  HEMOGLOBIN A1C  ABO/RH  TYPE AND SCREEN  PREPARE RBC (CROSSMATCH)  TROPONIN I (HIGH SENSITIVITY)  TROPONIN I (HIGH SENSITIVITY)    EKG EKG Interpretation  Date/Time:  Thursday April 29 2020 16:04:37 EST Ventricular Rate:  85 PR Interval:    QRS Duration: 94 QT Interval:  454 QTC Calculation: 540 R Axis:   39 Text Interpretation: Sinus rhythm Low voltage, precordial leads Borderline repolarization abnormality Prolonged QT interval No previous ECGs available Confirmed by Wandra Arthurs [phone]) on 04/18/2020 4:17:58 PM   Radiology DG Chest Portable 1 View  Result Date: 04/17/2020 CLINICAL DATA:  Hypotension. EXAM: PORTABLE CHEST 1 VIEW COMPARISON:  None. FINDINGS: Dense retrocardiac opacity. Right basilar atelectasis. Possible small left pleural effusion. No visible pneumothorax. Central venous catheter tip projects left of midline, most likely in the left brachiocephalic vein. Tracheostomy tube tip projects 3.7 cm above the carina. Partially imaged cervical ACDF. Mild enlargement the cardiac silhouette. Suspected loop recorder projects over the cardiac silhouette. IMPRESSION: 1. Dense left retrocardiac opacity, concerning for left lower lobe collapse. Aspiration or pneumonia is not excluded. Recommend follow-up to resolution. 2. Possible small left pleural effusion. 3. Central venous catheter tip projects left of midline, most likely in the left brachiocephalic vein. Findings discussed with Dody Smartt via telephone at 4:35 PM. Electronically Signed   By: Margaretha Sheffield MD   On: 04/15/2020 16:38    Procedures .Critical Care Performed by:  Emeline Darling, PA-C Authorized by: Emeline Darling, PA-C   Critical care provider statement:    Critical care time (minutes):  45   Critical care was necessary to treat or prevent imminent or life-threatening deterioration of the following conditions:  Sepsis and shock   Critical care was time spent personally by me on the following activities:  Discussions with consultants, evaluation of patient's response to treatment, examination of patient, ordering and performing treatments and interventions, ordering and review of laboratory studies, ordering and review of radiographic studies, pulse oximetry, re-evaluation of patient's condition, obtaining history from patient or surrogate and review of old charts   Care discussed with: admitting provider       Medications Ordered in ED Medications  norepinephrine (LEVOPHED) 44m in 2523mpremix infusion (4 mcg/min Intravenous Rate/Dose Change 04/18/2020 1715)  0.9 %  sodium chloride infusion (0 mL/hr Intravenous Hold 05/01/2020 1808)  potassium chloride 10 mEq in 100 mL IVPB (10 mEq Intravenous New Bag/Given 04/13/2020 2118)  docusate sodium (COLACE) capsule 100 mg (has no administration in time range)  polyethylene glycol (MIRALAX / GLYCOLAX) packet 17 g (has no administration in time range)  ondansetron (ZOFRAN)  injection 4 mg (4 mg Intravenous Given 04/22/2020 2026)  insulin aspart (novoLOG) injection 0-9 Units (0 Units Subcutaneous Not Given 04/20/2020 2114)  calcium gluconate 2 g/ 100 mL sodium chloride IVPB (has no administration in time range)  magnesium sulfate IVPB 2 g 50 mL (2 g Intravenous New Bag/Given 04/20/2020 2134)  ceFEPIme (MAXIPIME) 2 g in sodium chloride 0.9 % 100 mL IVPB (has no administration in time range)  vancomycin variable dose per unstable renal function (pharmacist dosing) (has no administration in time range)  pantoprazole (PROTONIX) injection 40 mg (40 mg Intravenous Given 04/17/2020 2131)  chlorhexidine gluconate (MEDLINE KIT)  (PERIDEX) 0.12 % solution 15 mL (15 mLs Mouth Rinse Given 04/13/2020 2128)  MEDLINE mouth rinse (15 mLs Mouth Rinse Given 04/28/2020 2114)  Chlorhexidine Gluconate Cloth 2 % PADS 6 each (6 each Topical Given 04/10/2020 2100)  vancomycin (VANCOREADY) IVPB 2000 mg/400 mL (0 mg Intravenous Stopped 04/20/2020 2008)  ceFEPIme (MAXIPIME) 2 g in sodium chloride 0.9 % 100 mL IVPB (0 g Intravenous Stopped 04/16/2020 1749)  fentaNYL (SUBLIMAZE) injection 50 mcg (50 mcg Intravenous Given 04/28/2020 1634)  acetaminophen (TYLENOL) 160 MG/5ML solution 1,000 mg (1,000 mg Per Tube Given 04/06/2020 1831)  lactated ringers bolus 1,000 mL (1,000 mLs Intravenous New Bag/Given 04/25/2020 1850)    ED Course  I have reviewed the triage vital signs and the nursing notes.  Pertinent labs & imaging results that were available during my care of the patient were reviewed by me and considered in my medical decision making (see chart for details).  Clinical Course as of 04/28/2020 2156  Thu Apr 29, 2020  1727 I have personally spoke with patient's wife, [name], on the phone.  She provided collateral information.  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  She states his health has been declining since that time.  She says he has been fluid overloaded as he is now since he arrived to [facility].  She states that he typically has fentanyl patches in place, however they were removed by the facility this morning.  She states he has been febrile at the facility for the last few days and that he has received 2 units of blood transfused this week.  [RS]  1308 Consult placed to critical care, they are agreeable to seeing this patient in the emergency department and admitting him to their service.  Appreciate their collaboration in the care of this patient. [RS]  6578 Sepsis - Repeat Assessment  Performed at: 18:05 pm  Vitals: HR 80, BP  119/63, RR 22, Os 100% on baseline vent settings.   *Patient on levophed 4 mcg  Heart: Regular rate and rhythm  Lungs: Diminished breath sounds on the left  Capillary Refill: > 2 sec  Peripheral Pulse: Radial pulse palpable  Skin: Pale     [RS]    Clinical Course User Index [RS] Katera Rybka, Gypsy Balsam, PA-C   MDM Rules/Calculators/A&P                         55 year old male who presents with concern for hypotension on Levophed drip via EMS from [facility].  Sepsis picture on intake, febrile to 103 and hypotensive on Levophed drip.  Cardiac exam is normal, pulmonary exam revealed rales bilaterally, abdominal exam with generalized tenderness.  Skin exam revealed multiple wounds and general anasarca. Will start with sepsis work-up and  broad-spectrum antibiotics vancomycin and cefepime, pain medication ordered.  VBG and I-stat revealed severe anemia with Hbg of 5.4. Will order transfusion. Hypokalemic, will start supplementation.   Patient continues to require levophed to maintain SBP >80.  Please see ED course above for further details of this patient's stay in the emergency department.  Concern for septic shock in this patient.  Case discussed with his wife, who stated that patient is full code; she voiced understanding of his medical evaluation and treatment plan.  Each of her questions was answered to her expressed satisfaction.  She is amenable to plan for admission at this time.  This chart was dictated using voice recognition software, Dragon. Despite the best efforts of this provider to proofread and correct errors, errors may still occur which can change documentation meaning.  Final Clinical Impression(s) / ED Diagnoses Final diagnoses:  Hypotension, unspecified hypotension type  Septic shock ([place])  Anemia, unspecified type    Rx / DC Orders ED Discharge Orders    None       Aura Dials 04/14/2020 2157    Drenda Freeze, MD 04/10/2020  2230

## 2020-04-29 NOTE — Sepsis Progress Note (Signed)
Code Sepsis monitoring. Lactic acid is WNL, blood cultures done and antibiotics given. Requiring low dose pressor support and awaiting blood transfusion. Now admitted to ICU. Will close sepsis and continue to follow as ICU patient. 0

## 2020-04-30 ENCOUNTER — Inpatient Hospital Stay (HOSPITAL_COMMUNITY): Payer: Medicare Other

## 2020-04-30 DIAGNOSIS — I5030 Unspecified diastolic (congestive) heart failure: Secondary | ICD-10-CM | POA: Diagnosis not present

## 2020-04-30 DIAGNOSIS — E440 Moderate protein-calorie malnutrition: Secondary | ICD-10-CM | POA: Insufficient documentation

## 2020-04-30 DIAGNOSIS — I959 Hypotension, unspecified: Secondary | ICD-10-CM | POA: Diagnosis not present

## 2020-04-30 DIAGNOSIS — R6521 Severe sepsis with septic shock: Secondary | ICD-10-CM | POA: Diagnosis not present

## 2020-04-30 DIAGNOSIS — L8990 Pressure ulcer of unspecified site, unspecified stage: Secondary | ICD-10-CM | POA: Insufficient documentation

## 2020-04-30 DIAGNOSIS — A4159 Other Gram-negative sepsis: Secondary | ICD-10-CM | POA: Diagnosis not present

## 2020-04-30 DIAGNOSIS — A419 Sepsis, unspecified organism: Secondary | ICD-10-CM | POA: Diagnosis not present

## 2020-04-30 LAB — BLOOD CULTURE ID PANEL (REFLEXED) - BCID2

## 2020-04-30 LAB — ECHOCARDIOGRAM LIMITED
Area-P 1/2: 3.48 cm2
Height: 74 in
S' Lateral: 3.8 cm
Weight: 3679.04 oz

## 2020-04-30 LAB — BASIC METABOLIC PANEL
Anion gap: 14 (ref 5–15)
BUN: 62 mg/dL — ABNORMAL HIGH (ref 6–20)
CO2: 20 mmol/L — ABNORMAL LOW (ref 22–32)
Calcium: 8 mg/dL — ABNORMAL LOW (ref 8.9–10.3)
Chloride: 101 mmol/L (ref 98–111)
Creatinine, Ser: 2.69 mg/dL — ABNORMAL HIGH (ref 0.61–1.24)
GFR, Estimated: 28 mL/min — ABNORMAL LOW (ref >60)
Glucose, Bld: 74 mg/dL (ref 70–99)
Potassium: 3.7 mmol/L (ref 3.5–5.1)
Sodium: 135 mmol/L (ref 135–145)

## 2020-04-30 LAB — CBC
HCT: 23.5 % — ABNORMAL LOW (ref 39.0–52.0)
Hemoglobin: 7.8 g/dL — ABNORMAL LOW (ref 13.0–17.0)
MCH: 29.1 pg (ref 26.0–34.0)
MCHC: 33.2 g/dL (ref 30.0–36.0)
MCV: 87.7 fL (ref 80.0–100.0)
Platelets: 161 10*3/uL (ref 150–400)
RBC: 2.68 MIL/uL — ABNORMAL LOW (ref 4.22–5.81)
RDW: 15.7 % — ABNORMAL HIGH (ref 11.5–15.5)
WBC: 11.6 10*3/uL — ABNORMAL HIGH (ref 4.0–10.5)
nRBC: 0 % (ref 0.0–0.2)

## 2020-04-30 LAB — GLUCOSE, CAPILLARY
Glucose-Capillary: 64 mg/dL — ABNORMAL LOW (ref 70–99)
Glucose-Capillary: 69 mg/dL — ABNORMAL LOW (ref 70–99)
Glucose-Capillary: 73 mg/dL (ref 70–99)
Glucose-Capillary: 77 mg/dL (ref 70–99)
Glucose-Capillary: 78 mg/dL (ref 70–99)
Glucose-Capillary: 78 mg/dL (ref 70–99)
Glucose-Capillary: 83 mg/dL (ref 70–99)
Glucose-Capillary: 99 mg/dL (ref 70–99)

## 2020-04-30 LAB — PHOSPHORUS: Phosphorus: 7.9 mg/dL — ABNORMAL HIGH (ref 2.5–4.6)

## 2020-04-30 LAB — MRSA PCR SCREENING: MRSA by PCR: NEGATIVE

## 2020-04-30 LAB — MAGNESIUM: Magnesium: 2 mg/dL (ref 1.7–2.4)

## 2020-04-30 LAB — HEMOGLOBIN A1C
Hgb A1c MFr Bld: 5.1 % (ref 4.8–5.6)
Mean Plasma Glucose: 99.67 mg/dL

## 2020-04-30 MED ORDER — DEXTROSE 50 % IV SOLN
INTRAVENOUS | Status: AC
  Filled 2020-04-30: qty 50

## 2020-04-30 MED ORDER — ALBUMIN HUMAN 25 % IV SOLN
50.0000 g | Freq: Every day | INTRAVENOUS | Status: DC
  Administered 2020-04-30 – 2020-05-03 (×4): 50 g via INTRAVENOUS
  Filled 2020-04-30 (×4): qty 200

## 2020-04-30 MED ORDER — DEXTROSE 50 % IV SOLN: 12.5000 g | INTRAVENOUS | Status: AC

## 2020-04-30 MED ORDER — IOHEXOL 300 MG/ML  SOLN
50.0000 mL | Freq: Once | INTRAMUSCULAR | Status: AC | PRN
  Administered 2020-04-30: 50 mL

## 2020-04-30 MED ORDER — POLYETHYLENE GLYCOL 3350 17 G PO PACK
17.0000 g | PACK | Freq: Every day | ORAL | Status: DC | PRN
  Administered 2020-05-07: 17 g

## 2020-04-30 MED ORDER — FLUDROCORTISONE ACETATE 0.1 MG PO TABS
0.2000 mg | ORAL_TABLET | Freq: Two times a day (BID) | ORAL | Status: DC
  Administered 2020-04-30 – 2020-05-09 (×20): 0.2 mg
  Filled 2020-04-30 (×21): qty 2

## 2020-04-30 MED ORDER — OSMOLITE 1.5 CAL PO LIQD
1000.0000 mL | ORAL | Status: DC
  Administered 2020-04-30 – 2020-05-17 (×21): 1000 mL
  Filled 2020-04-30 (×29): qty 1000

## 2020-04-30 MED ORDER — JUVEN PO PACK
1.0000 | PACK | Freq: Two times a day (BID) | ORAL | Status: DC
  Administered 2020-04-30 – 2020-06-03 (×62): 1
  Filled 2020-04-30 (×62): qty 1

## 2020-04-30 MED ORDER — DEXTROSE 50 % IV SOLN
25.0000 mL | Freq: Once | INTRAVENOUS | Status: AC
  Administered 2020-04-30: 25 mL via INTRAVENOUS

## 2020-04-30 MED ORDER — SODIUM CHLORIDE 0.9 % IV SOLN
2.0000 g | Freq: Two times a day (BID) | INTRAVENOUS | Status: DC
  Administered 2020-04-30 – 2020-05-03 (×7): 2 g via INTRAVENOUS
  Filled 2020-04-30 (×9): qty 2

## 2020-04-30 MED ORDER — DOCUSATE SODIUM 50 MG/5ML PO LIQD
100.0000 mg | Freq: Two times a day (BID) | ORAL | Status: DC | PRN
  Filled 2020-04-30: qty 10

## 2020-04-30 MED ORDER — PROSOURCE TF PO LIQD
90.0000 mL | Freq: Three times a day (TID) | ORAL | Status: DC
  Administered 2020-04-30 – 2020-05-18 (×54): 90 mL
  Filled 2020-04-30 (×55): qty 90

## 2020-04-30 MED ORDER — DEXTROSE 50 % IV SOLN
INTRAVENOUS | Status: AC
  Administered 2020-04-30: 12.5 g via INTRAVENOUS
  Filled 2020-04-30: qty 50

## 2020-04-30 MED ORDER — ADULT MULTIVITAMIN W/MINERALS CH
1.0000 | ORAL_TABLET | Freq: Every day | ORAL | Status: DC
  Administered 2020-04-30 – 2020-06-02 (×34): 1
  Filled 2020-04-30 (×34): qty 1

## 2020-04-30 MED ORDER — LEVOTHYROXINE SODIUM 25 MCG PO TABS
125.0000 ug | ORAL_TABLET | Freq: Every day | ORAL | Status: DC
  Administered 2020-05-02 – 2020-06-03 (×32): 125 ug
  Filled 2020-04-30 (×32): qty 1

## 2020-04-30 MED FILL — Sodium Chloride Flush IV Soln 0.9%: INTRAVENOUS | Qty: 10 | Status: AC

## 2020-04-30 MED FILL — Norepinephrine Bitartrate IV Soln 1 MG/ML (Base Equivalent): INTRAVENOUS | Qty: 4 | Status: AC

## 2020-04-30 NOTE — Progress Notes (Signed)
NAME:  Phillip Klein, MRN:  [id number], DOB:  [birth year], LOS: 1 ADMISSION DATE:  04/21/2020, CONSULTATION DATE: 2/24 REFERRING MD: Dr. Silverio Lay, CHIEF COMPLAINT: Sepsis  Brief History   54yo male who presented from [facility] for evaluation of hypotension. PCCM consulted for further management of sepsis and hypotension.    Past Medical History  Chronic hypoxic respiratory failure  Hx of MRSA GERD Type 2 diabetes Anemia of chronic illness Osteomyelitis of cervical spine  Quadriplegia Spinal epidural abscess C2-C7 and T9-L4 Malnutrition Neurogenic astrostatic hypotension  HFrEF Untreated HCV Previous IVDU Cirrhosis  Chronic pain   Significant Hospital Events   Admitted 2/24  Consults:  CCM  Procedures:  None  Significant Diagnostic Tests:  CXR 2/24-dense retrocardiac opacity concerning for left lower lobe collapse.  Aspiration or pneumonia is not excluded.  Recommend follow-up to resolution.  Possible small left pleural effusion  CXR 2/25-veiling opacity in the mid to lower lungs likely reflecting atelectasis and layering pleural effusion.  Underlying airspace disease difficult to exclude.  Right IJ approach central venous catheter courses to the level of the SVC.  Micro Data:  MRSA PCR screening-negative Respiratory panel-negative Blood cultures collected 2/24-pending Urine cultures collected 2/24-pending  Antimicrobials:  Cefepime-2/24>> Vancomycin-2/24>>  Interim history/subjective:  Patient was admitted to the ICU yesterday.  He did well overnight and his Levophed was weaned.  Nurse reports ostomy output is relatively dark.  Objective   Blood pressure 100/62, pulse 79, temperature (!) 97.1 F (36.2 C), temperature source Axillary, resp. rate (!) 26, height 6\' 2"  (1.88 m), weight 104.3 kg, SpO2 100 %.    Vent Mode: PRVC FiO2 (%):  [50 %-100 %] 100 % Set Rate:  [26 bmp] 26 bmp Vt Set:  [510 mL] 510 mL PEEP:  [10 cmH20] 10 cmH20 Plateau Pressure:  [20  cmH20-23 cmH20] 21 cmH20   Intake/Output Summary (Last 24 hours) at 04/30/2020 05/02/2020 Last data filed at 04/30/2020 0600 Gross per 24 hour  Intake 1327.03 ml  Output 125 ml  Net 1202.03 ml   Filed Weights   04/13/2020 1610 04/30/20 0600  Weight: 107.5 kg 104.3 kg    Examination: General: Chronically ill-appearing 55 year old male, on ventilator HENT: Tracheostomy in place Lungs: Clear to auscultation bilaterally with vent supported breathing Cardiovascular: Regular rate and rhythm, no murmurs appreciated Abdomen: GJ tube in place.  No drainage noted from GJ tube on evaluation although initial evaluation had considerable amount of green discharge.  Granulation tissue noted around wounds and wounds are well dressed Extremities: Considerable amount of edema noted in lower and upper extremities Neuro: Alert, awake, does not move arms or legs GU: Foley catheter in place  Resolved Hospital Problem list     Assessment & Plan:   Phillip Klein is a 55 y.o. with a pertinent PMH of chronic vent dependence via trach, osteomyelitis of cervical spine, quadriplegia, spinal epidural abscess, previous IVDU, cirrhosis, untreated HCV, T2DM, GERD, who presented with hypotension after J-tube replacement and admitted for sepsis.   Sepsis Patient presented meeting criteria for sepsis with hypothermia, tachypnea, elevated WBC with suspected source of infection.  Patient has history of MRSA bacteremia.  Unclear etiology of source of sepsis.  Most likely urinary in source.  Also concern given PICC line. -Admitted to the ICU on 2/24 -Will exchange Foley catheter -Blood cultures collected from PICC line, continue to monitor results -Continue vent management -Careful fluid resuscitation given her heart failure -Continue map goal of greater than 65 -Continue IV antibiotics vancomycin and cefepime at  this time -Monitor urine output  AKI  We have no history of kidney issues.  Creatinine on arrival of 1.79  which increased to 2 and then was increased to 2.69 this morning.  Patient received gentle hydration given sepsis but was cautious due to right heart failure.  Most likely prerenal due to sepsis. -Continue to monitor creatinines with morning BMPs -Cautious with hydration. -50 g of albumin daily -Consider diuresis if continues to worsen -Avoid nephrotoxic agents  Jejunostomy tube drainage Unclear if it is still in place at this time.  Report is that it was manipulated/adjusted yesterday. -We will have tube check under fluoroscopy  Chronic hypoxic respiratory failure Patient is trach dependent since becoming quadriplegic in 2021. -Continue vent support -Wean PEEP and FiO2 for sats greater than 90% -VAP bundle -Follow-up on cultures  Quadriplegia with history of spinal epidural abscess C2-C7 and T9-L4 Osteomyelitis of cervical spine Patient underwent C2-T2 2 posterior fusion and multiple laminectomies with autograft and allograft at [facility]. -Supportive care  Multiple pressure ulcers Open abdominal wound S/p ex lap for G-tube malfunction -Wound care consulted -Pressure relieving devices -Optimize nutrition  Right heart failure Most recent echocardiogram on 10/08/2019 showed normal left ventricular systolic function with mild LVH.  It also showed mild right ventricular systolic dysfunction with trivial mitral regurg and tricuspid regurg. -Repeat echo has been ordered and results are pending at this time -Gentle hydration if needed but avoid if possible  Best Practice:  Diet: NPO at this time, tube feeds when able Pain/Anxiety/Delirium protocol (if indicated): None VAP protocol (if indicated): Ordered DVT prophylaxis: Subcu heparin GI prophylaxis: PPI Glucose control: None Mobility: Bedbound Code Status: Full code Family Communication: Wife Disposition: ICU  Labs   CBC: Recent Labs  Lab 05/12/2020 1639 May 12, 2020 1640 2020/05/12 2200 04/30/20 0545  WBC  --   --  15.5* 11.6*   NEUTROABS  --   --  13.8*  --   HGB 5.1* 5.4* 7.2* 7.8*  HCT 15.0* 16.0* 22.4* 23.5*  MCV  --   --  91.4 87.7  PLT  --   --  179 161    Basic Metabolic Panel: Recent Labs  Lab 12-May-2020 1606 05/12/20 1639 2020/05/12 1640 05/12/2020 2200 04/30/20 0545  NA 132* 136 137  --  135  K 2.5* 3.0* 2.9*  --  3.7  CL 110  --  105  --  101  CO2 13*  --   --   --  20*  GLUCOSE 258*  --  180*  --  74  BUN 42*  --  61*  --  62*  CREATININE 1.79*  --  2.00*  --  2.69*  CALCIUM 5.0*  --   --   --  8.0*  MG 1.0*  --   --  1.8 2.0  PHOS  --   --   --  7.5* 7.9*   GFR: Estimated Creatinine Clearance: 41.3 mL/min (A) (by C-G formula based on SCr of 2.69 mg/dL (H)). Recent Labs  Lab 2020-05-12 1606 05/12/2020 2200 04/30/20 0545  PROCALCITON  --  34.80  --   WBC  --  15.5* 11.6*  LATICACIDVEN 0.7  --   --     Liver Function Tests: Recent Labs  Lab May 12, 2020 1606  AST 20  ALT 9  ALKPHOS 85  BILITOT 0.6  PROT 3.9*  ALBUMIN <1.0*   No results for input(s): LIPASE, AMYLASE in the last 168 hours. No results for input(s): AMMONIA in the last  168 hours.  ABG    Component Value Date/Time   HCO3 16.5 (L) 04/13/2020 1639   TCO2 18 (L) 04/19/2020 1640   ACIDBASEDEF 7.0 (H) 04/17/2020 1639   O2SAT 100.0 05/01/2020 1639     Coagulation Profile: Recent Labs  Lab 04/06/2020 2200  INR 1.4*    Cardiac Enzymes: No results for input(s): CKTOTAL, CKMB, CKMBINDEX, TROPONINI in the last 168 hours.  HbA1C: Hgb A1c MFr Bld  Date/Time Value Ref Range Status  04/06/2020 10:00 PM 5.1 4.8 - 5.6 % Final    Comment:    (NOTE) Pre diabetes:          5.7%-6.4%  Diabetes:              >6.4%  Glycemic control for   <7.0% adults with diabetes     CBG: Recent Labs  Lab 04/10/2020 1623 04/21/2020 2020 04/09/2020 2329 04/30/20 0336  GLUCAP 270* 84 78 73    Allergies Allergies  Allergen Reactions  . Acetaminophen Other (See Comments)    "SHUTS DOWN MY ORGANS"  . Gabapentin Other (See Comments)     SUICIDAL IDEATION- Suicidal thoughts and actions      Home Medications  Prior to Admission medications   Medication Sig Start Date End Date Taking? Authorizing Provider  acetaminophen (TYLENOL) 325 MG tablet Place 650 mg into feeding tube every 6 (six) hours as needed for mild pain.   Yes [provider]  alum & mag hydroxide-simeth (MAG-AL PLUS) 200-200-20 MG/5ML suspension Place 30 mLs into feeding tube every 6 (six) hours as needed (GERD WITH ESOPHAGITIS WITHOUT BLEEDING).   Yes [provider]  amiodarone (PACERONE) 200 MG tablet Place 200 mg into feeding tube in the morning.   Yes [provider]  amLODipine (NORVASC) 5 MG tablet Place 5 mg into feeding tube daily.   Yes [provider]  chlorhexidine (PERIDEX) 0.12 % solution 15 mLs by Mouth Rinse route 2 (two) times daily.   Yes [provider]  clonazePAM (KLONOPIN) 0.5 MG tablet Place 0.5 mg into feeding tube every 6 (six) hours as needed (for generalized anxiety disorder).   Yes [provider]  fentaNYL (DURAGESIC) 100 MCG/HR Place 1 patch onto the skin every 3 (three) days.   Yes [provider]  fentaNYL (DURAGESIC) 50 MCG/HR Place 1 patch onto the skin every 3 (three) days.   Yes [provider]  ferrous sulfate 220 (44 Fe) MG/5ML solution Place 220 mg into feeding tube in the morning and at bedtime.   Yes [provider]  fludrocortisone (FLORINEF) 0.1 MG tablet 0.2 mg See admin instructions. 0.2 mg, per G-Tube, every twelve hours   Yes [provider]  furosemide (LASIX) 40 MG tablet Place 40 mg into feeding tube in the morning.   Yes [provider]  ipratropium-albuterol (DUONEB) 0.5-2.5 (3) MG/3ML SOLN Take 3 mLs by nebulization every 4 (four) hours as needed (for acute and chronic respiratory failure with hypercapnia).   Yes [provider]  lansoprazole (PREVACID SOLUTAB) 30 MG disintegrating tablet Place 30 mg into  feeding tube in the morning.   Yes [provider]  levothyroxine (SYNTHROID) 125 MCG tablet Place 125 mcg into feeding tube in the morning.   Yes [provider]  loperamide (IMODIUM A-D) 2 MG tablet Place 2 mg into feeding tube every 8 (eight) hours as needed for diarrhea or loose stools.   Yes [provider]  Nutritional Supplements (FEEDING SUPPLEMENT, VITAL 1.5 CAL,) LIQD  Place 50 mL/hr into feeding tube continuous.   Yes [provider]  ondansetron (ZOFRAN-ODT) 4 MG disintegrating tablet 4 mg See admin instructions. 4 mg, per G-Tube, every six hours as needed for nausea   Yes [provider]  Oxycodone HCl 10 MG TABS Place 10 mg into feeding tube every 6 (six) hours as needed (for severe pain).   Yes [provider]  PRESCRIPTION MEDICATION Inject 42 mL/hr into the vein See admin instructions. D10W (dextrose 10% in water): 42 ml's/hr "by shift"   Yes [provider]  sertraline (ZOLOFT) 100 MG tablet Place 100 mg into feeding tube daily.   Yes [provider]  Sodium Chloride Flush (NORMAL SALINE FLUSH) 0.9 % SOLN Inject 10 mLs into the vein See admin instructions. Flush with 10 ml's of normal saline before and after medications   Yes [provider]  traZODone (DESYREL) 100 MG tablet Place 100 mg into feeding tube at bedtime.   Yes [provider]     Derrel Nip, MD  PGY-2, [facility]  04/30/2020  7:26 AM

## 2020-04-30 NOTE — Progress Notes (Signed)
Initial Nutrition Assessment  DOCUMENTATION CODES:   Non-severe (moderate) malnutrition in context of chronic illness  INTERVENTION:   Initiate tube feeds via J-port: - Start Osmolite 1.5 @ 20 ml/hr and advance by 10 ml q 4 hours to goal rate of 60 ml/hr (1440 ml/day) - ProSource TF 90 ml TID  Tube feeding regimen at goal provides 2400 kcal, 156 grams of protein, and 1097 ml of H2O.  Monitor magnesium, potassium, and phosphorus daily for at least 3 days, MD to replete as needed, as pt is at risk for refeeding syndrome given malnutrition, no tube feeding x 5 days.  - 1 packet Juven BID per tube, each packet provides 95 calories, 2.5 grams of protein, and 9.8 grams of carbohydrate; also contains 7 grams of L-arginine and L-glutamine, 300 mg vitamin C, 15 mg vitamin E, 1.2 mcg vitamin B-12, 9.5 mg zinc, 200 mg calcium, and 1.5 g calcium Beta-hydroxy-Beta-methylbutyrate to support wound healing  - MVI with minerals daily per tube  NUTRITION DIAGNOSIS:   Moderate Malnutrition related to chronic illness (CHF, cirrhosis) as evidenced by moderate fat depletion,severe muscle depletion.  GOAL:   Patient will meet greater than or equal to 90% of their needs  MONITOR:   Vent status,Labs,Weight trends,TF tolerance,Skin,I & O's  REASON FOR ASSESSMENT:   Ventilator,Consult Enteral/tube feeding initiation and management  ASSESSMENT:   55 year old male who presented to the ED on 2/24 from [facility] with hypotension, fever. Pt was scheduled to have revision of J-tube on 2/24 and was found to be hypotensive. PMH of vent dependence via trach, quadriplegia after cervical spine surgery in summer of 2021, s/p ex-lap for G-tube malfunction, multiple pressure injuries, GERD, T2DM, anemia, CHF, previous IVDA, cirrhosis. Pt admitted with septic shock.   Discussed pt with RN and during ICU rounds. Levophed off this morning. X-ray with contrast ordered to check placement of J-tube. X-ray reading says that  tip of the feeding tube is likely in the descending duodenum and no tubing is seen within the jejunum. Per discussion with MD, okay to order tube feeds.  Difficult to determine dry weight at this time given severe edema and lack of weight history in chart.  Spoke with pt's family member at bedside who reports pt has not had tube feeding since Monday. She does not know his typical tube feeding formula or rate. She states that she was told by [facility] that the pt was receiving protein.  Patient is on chronic ventilator support via trach. MV: 13.4 L/min Temp (24hrs), Avg:98.2 F (36.8 C), Min:95.7 F (35.4 C), Max:103 F (39.4 C) BP (cuff): 97/64 MAP (cuff): 75  Medications reviewed and include: SSI q 4 hours, IV protonix, IV abx, IV albumin 50 grams daily  Labs reviewed: BUN 62, creatinine 2.69, phosphorus 7.9, hemoglobin 7.8 CBG's: 73-270 x 24 hours  UOP: 125 ml x 12 hours I/O's: +1.2 L since admit  NUTRITION - FOCUSED PHYSICAL EXAM:  Flowsheet Row Most Recent Value  Orbital Region Moderate depletion  Upper Arm Region Moderate depletion  Thoracic and Lumbar Region Moderate depletion  Buccal Region Severe depletion  Temple Region Severe depletion  Clavicle Bone Region Severe depletion  Clavicle and Acromion Bone Region Severe depletion  Scapular Bone Region Unable to assess  Dorsal Hand Unable to assess  Patellar Region Unable to assess  Anterior Thigh Region Unable to assess  Posterior Calf Region Unable to assess  Edema (RD Assessment) Severe  Hair Reviewed  Eyes Reviewed  Mouth Reviewed  Skin Reviewed  Nails  Reviewed       Diet Order:   Diet Order            Diet NPO time specified  Diet effective now                 EDUCATION NEEDS:   No education needs have been identified at this time  Skin:  Skin Assessment: Skin Integrity Issues: DTI: right leg Stage I: right leg Stage IV: vertebral column, sacrum, bilateral ischial tuberosity, right  hip Unstageable: left leg x 2, right leg x 3 Other: skin tear right arm  Last BM:  2020-05-03 colostomy  Height:   Ht Readings from Last 1 Encounters:  03-May-2020 6\' 2"  (1.88 m)    Weight:   Wt Readings from Last 1 Encounters:  04/30/20 104.3 kg    BMI:  Body mass index is 29.52 kg/m.  Estimated Nutritional Needs:   Kcal:  2400-2600  Protein:  150-170 grams  Fluid:  >/= 2.0 L    05/02/20, MS, RD, LDN Inpatient Clinical Dietitian Please see AMiON for contact information.

## 2020-04-30 NOTE — Progress Notes (Signed)
PHARMACY - PHYSICIAN COMMUNICATION CRITICAL VALUE ALERT - BLOOD CULTURE IDENTIFICATION (BCID)  Phillip Klein is an 55 y.o. male who presented to [facility] on 04/14/2020 with a chief complaint of J-tube revision but was noted with septic shock.   Assessment: 1 aerobic bottle growing [name] pneumoniae with noted CTX-M resistance  Name of physician (or Provider) Contacted: McQuaid  Current antibiotics: Vancomycin and Cefepime   Changes to prescribed antibiotics recommended: Merropenem   Results for orders placed or performed during the hospital encounter of 04/19/2020  Blood Culture ID Panel (Reflexed) (Collected: 05/01/2020  5:20 PM)  Result Value Ref Range   Enterococcus faecalis NOT DETECTED NOT DETECTED   Enterococcus Faecium NOT DETECTED NOT DETECTED   Listeria monocytogenes NOT DETECTED NOT DETECTED   Staphylococcus species NOT DETECTED NOT DETECTED   Staphylococcus aureus (BCID) NOT DETECTED NOT DETECTED   Staphylococcus epidermidis NOT DETECTED NOT DETECTED   Staphylococcus lugdunensis NOT DETECTED NOT DETECTED   Streptococcus species NOT DETECTED NOT DETECTED   Streptococcus agalactiae NOT DETECTED NOT DETECTED   Streptococcus pneumoniae NOT DETECTED NOT DETECTED   Streptococcus pyogenes NOT DETECTED NOT DETECTED   A.calcoaceticus-baumannii NOT DETECTED NOT DETECTED   Bacteroides fragilis NOT DETECTED NOT DETECTED   Enterobacterales DETECTED (A) NOT DETECTED   Enterobacter cloacae complex NOT DETECTED NOT DETECTED   Escherichia coli NOT DETECTED NOT DETECTED   [name] aerogenes NOT DETECTED NOT DETECTED   [name] oxytoca NOT DETECTED NOT DETECTED   [name] pneumoniae DETECTED (A) NOT DETECTED   Proteus species NOT DETECTED NOT DETECTED   Salmonella species NOT DETECTED NOT DETECTED   Serratia marcescens NOT DETECTED NOT DETECTED   Haemophilus influenzae NOT DETECTED NOT DETECTED   Neisseria meningitidis NOT DETECTED NOT DETECTED   Pseudomonas aeruginosa  NOT DETECTED NOT DETECTED   Stenotrophomonas maltophilia NOT DETECTED NOT DETECTED   Candida albicans NOT DETECTED NOT DETECTED   Candida auris NOT DETECTED NOT DETECTED   Candida glabrata NOT DETECTED NOT DETECTED   Candida krusei NOT DETECTED NOT DETECTED   Candida parapsilosis NOT DETECTED NOT DETECTED   Candida tropicalis NOT DETECTED NOT DETECTED   Cryptococcus neoformans/gattii NOT DETECTED NOT DETECTED   CTX-M ESBL DETECTED (A) NOT DETECTED   Carbapenem resistance IMP NOT DETECTED NOT DETECTED   Carbapenem resistance KPC NOT DETECTED NOT DETECTED   Carbapenem resistance NDM NOT DETECTED NOT DETECTED   Carbapenem resist OXA 48 LIKE NOT DETECTED NOT DETECTED   Carbapenem resistance VIM NOT DETECTED NOT DETECTED    Isaias Sakai, PharmD, MBA Pharmacy Resident [phone] 04/30/2020 10:51 AM

## 2020-04-30 NOTE — Progress Notes (Signed)
Pharmacy Antibiotic Note  Phillip Klein is a 55 y.o. male admitted on May 13, 2020 with sepsis 2/2 to ESBL [name] bacteremia (likely urinary source).  Pharmacy has been consulted for meropenem dosing.  Patient previously on vancomycin and cefepime sepsis now optimized to meropenem for ESBL bacteremia. Will start meropenem 2g q12hr due to CrCl ~41 ml/min. Pharmacy will continue to monitor renal function as patient currently experiencing AKI.  Plan: Discontinue cefepime and vancomycin Start meropenem 2g q12hr Monitor renal function, cultures and further recommendations  Height: 6\' 2"  (188 cm) Weight: 104.3 kg (229 lb 15 oz) IBW/kg (Calculated) : 82.2  Temp (24hrs), Avg:98.6 F (37 C), Min:96.5 F (35.8 C), Max:103 F (39.4 C)  Recent Labs  Lab May 13, 2020 1606 05/13/2020 1640 05/13/20 2200 04/30/20 0545  WBC  --   --  15.5* 11.6*  CREATININE 1.79* 2.00*  --  2.69*  LATICACIDVEN 0.7  --   --   --     Estimated Creatinine Clearance: 41.3 mL/min (A) (by C-G formula based on SCr of 2.69 mg/dL (H)).    Allergies  Allergen Reactions  . Acetaminophen Other (See Comments)    "SHUTS DOWN MY ORGANS"  . Gabapentin Other (See Comments)    SUICIDAL IDEATION- Suicidal thoughts and actions     Antimicrobials this admission: 2/24 Cefepime >> 2/25 2/24 vancomycin >> 2/25 2/25 meropenem >>   Microbiology results: 2/24 MRSA PCR neg 2/25 Bcx 1/2 ESBL K pna 2/25 Ucx ordered  Thank you for allowing pharmacy to be a part of this patient's care.  3/25, PharmD PGY2 ID Pharmacy Resident Phone between 7 am - 3:30 pm: Margarite Gouge  Please check AMION for all [facility] Pharmacy phone numbers After 10:00 PM, call Main Pharmacy [phone]  04/30/2020 10:54 AM

## 2020-04-30 NOTE — Progress Notes (Signed)
  Echocardiogram 2D Echocardiogram has been performed.  Phillip Klein 04/30/2020, 10:55 AM

## 2020-04-30 NOTE — Progress Notes (Signed)
Attending:   I have seen and examined the patient with Dr. Nobie Putnam and agree with the findings in his note.  Subjective: 55 y/o male admitted from [facility]  In 2021 he had an epidural abscess which was drained but unfortunately resulted in paralysis.   He presented here for J-tube revision but was noted to have septic shock so he was admitted.   Overnight levophed was weaned He has been treated with broad spectrum antibiotics No complaints this morning  He is hungry   Objective: Vitals:   04/30/20 0615 04/30/20 0630 04/30/20 0645 04/30/20 0756  BP: 104/65 100/63 100/62   Pulse: 80 80 79   Resp: (!) 26 (!) 26 (!) 26   Temp:    (!) 96.5 F (35.8 C)  TempSrc:    Axillary  SpO2: 100% 100% 100%   Weight:      Height:       Vent Mode: PRVC FiO2 (%):  [50 %-100 %] 100 % Set Rate:  [26 bmp] 26 bmp Vt Set:  [510 mL] 510 mL PEEP:  [10 cmH20] 10 cmH20 Plateau Pressure:  [20 cmH20-23 cmH20] 21 cmH20  Intake/Output Summary (Last 24 hours) at 04/30/2020 0902 Last data filed at 04/30/2020 0600 Gross per 24 hour  Intake 1327.03 ml  Output 125 ml  Net 1202.03 ml    General:  In bed on vent HENT: NCAT tracheostomy in place PULM: CTA B, vent supported breathing CV: RRR, no mgr GI: GJ tube in place, wound well dressed, no drainage from GJ on my exam, granulation tissue noted MSK: no bulk or tone Neuro: awake, speaking, doesn't move arms/legs    CBC    Component Value Date/Time   WBC 11.6 (H) 04/30/2020 0545   RBC 2.68 (L) 04/30/2020 0545   HGB 7.8 (L) 04/30/2020 0545   HCT 23.5 (L) 04/30/2020 0545   PLT 161 04/30/2020 0545   MCV 87.7 04/30/2020 0545   MCH 29.1 04/30/2020 0545   MCHC 33.2 04/30/2020 0545   RDW 15.7 (H) 04/30/2020 0545   LYMPHSABS 1.1 20-May-2020 2200   MONOABS 0.5 20-May-2020 2200   EOSABS 0.0 05/20/2020 2200   BASOSABS 0.0 05-20-20 2200    BMET    Component Value Date/Time   NA 135 04/30/2020 0545   K 3.7 04/30/2020 0545   CL 101  04/30/2020 0545   CO2 20 (L) 04/30/2020 0545   GLUCOSE 74 04/30/2020 0545   BUN 62 (H) 04/30/2020 0545   CREATININE 2.69 (H) 04/30/2020 0545   CALCIUM 8.0 (L) 04/30/2020 0545   GFRNONAA 28 (L) 04/30/2020 0545    CXR images reviewed, bilateral airspace disease,   Impression/Plan:  Chronic respiratory failure with hypoxemia in setting of quadriplegia >  Continue current vent settings, trach care per routine Septic shock> presumed source is UTI, continue cefepime and vanc for now; presented with indwelling line present on admission, chronic indwelling foley which are possible sources of infection as well (currently unclear as cultures negative), now off levophed, monitor off that for now Jejuonostomy tube with drainage > unclear if if this still in place, tube check today with radiology Anasarca, hypoalbuminemia > albumin today Mild RV systolic function> careful with fluids, keep net even today but will give 50gm albumin today Oliguric AKI> worse, due to shock, keep net even today, monitor UOP Multiple pressure ulcers present on admission > wound care Open abdominal wound, chronic, present on admission > wound care  Code status> Full code, discussed with family  My cc  time 31 minutes  [place] Dunlap, MD [place] [facility] Pager: [phone] Cell: [phone] After 3pm or if no response, call [phone]

## 2020-04-30 NOTE — Progress Notes (Signed)
eLink Physician-Brief Progress Note Patient Name: Jarious Lyon DOB: 04/08/1965 MRN: [id number]   Date of Service  04/30/2020  HPI/Events of Note  Hemoglobin resulted as 7.2 gm / dl, first of 3 ordered units of blood is infusing.  eICU Interventions  The order for the other two units was discontinued.        Thomasene Lot Khiree Bukhari 04/30/2020, 4:23 AM

## 2020-04-30 NOTE — Consult Note (Addendum)
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 G tube is leaking large amount of green drainage. Surgical abdominal wound that has had skin grafts with a few open wounds that are yellow. Next to that wound is another surgical site with open wounds that are yellow, unknown drainage due to the leakage of the G tube.   Skin donor site left upper thigh is an intact keloid. Left leg wound black, brown pink with serosanguinous drainage. Right lower lateral leg purple wound with no drainage and a second wound with serosanguinous drainage. Skin tear on the right arm.  Pressure Injury POA: Yes Measurement: See flow sheet Dressing procedure/placement/frequency:  1. All stage 4 wounds of the sacrum, tuberosities, hip and back. Clean with NS. Pack with moistened saline gauze, cover with 4 x 4s and ABD pads and secure with medipore tape. Change dressings BID 2. Abdominal wounds and leg wounds that are draining, place Aquacel Advantage [place] # [phone]) over the wound and secure with gauze and ABD pads or foam dressing. Change daily or PRN soiling from body fluid.  3. G tube site. Clean around the tube with NS, then place Drawtex Trach dressing [place] # [id number]) around the tube for maximum absorption. Change daily or PRN soiling.   4. Skin tear on the right arm, cover with foam dressing, change PRN per saturation from body fluids.  5. Bilateral heels covered with foam dressing. Place both feet in Prevalon boots.   WOC Nurse ostomy consult note Stoma type/location: LLQ Colostomy Stomal assessment/size: Approx 1 1/8" just above the the level of skin. Peristomal assessment: Pouch changed last night. Did not change to assess today.  Treatment options for stomal/peristomal skin: If patient is experiencing any breakdown around the  stoma you may crust before place barrier ring and pouch. Instruction for "Crusting" with Stoma Powder and Skin Barrier Wipes: 1. After cleaning around the stoma WITH WATER ONLY, sprinkle Stoma Powder [place] # 6) on the irritated skin. 2. Spread the powder around with your finger.  The powder will adhere ONLY to wet, raw skin.  Brush any loose powder off. 3. DAB the powder that stuck to the irritated skin with a skin barrier wipe [place] # [phone]).  Do NOT rub, only dab the powder to moisten it. 4. Allow the powder to air dry.  You can repeat the process of spreading powder and dabbing up to three times, allowing each process to air dry. 5. Place the prepared pouching system around the stoma. Output: Mushy black,green Ostomy pouching: 1pc.  Education provided: Patient admitted from LTAC with ostomy.  Enrolled patient in [employer] DC program: No  1 piece fecal pouching system                        1 piece pouch  [place] # [id number] ring  [place] # [id number]  Monitor the wound area(s) for worsening of condition such as: Signs/symptoms of infection, increase in size, development of or worsening of odor, development of pain, or increased pain at the affected locations.   Notify the medical team if any of these develop.  Thank you for the consult. WOC nurse will not follow at this time.   Please re-consult the WOC team if needed.  Renaldo Reel [name], MSN, RN, CMSRN, Angus Seller, [facility] Wound Treatment Associate Pager [phone]

## 2020-05-01 ENCOUNTER — Inpatient Hospital Stay: Payer: Self-pay

## 2020-05-01 ENCOUNTER — Inpatient Hospital Stay (HOSPITAL_COMMUNITY): Payer: Medicare Other

## 2020-05-01 DIAGNOSIS — A498 Other bacterial infections of unspecified site: Secondary | ICD-10-CM

## 2020-05-01 DIAGNOSIS — B1920 Unspecified viral hepatitis C without hepatic coma: Secondary | ICD-10-CM

## 2020-05-01 DIAGNOSIS — B961 Klebsiella pneumoniae [K. pneumoniae] as the cause of diseases classified elsewhere: Secondary | ICD-10-CM

## 2020-05-01 DIAGNOSIS — A4189 Other specified sepsis: Secondary | ICD-10-CM | POA: Diagnosis not present

## 2020-05-01 DIAGNOSIS — Z452 Encounter for adjustment and management of vascular access device: Secondary | ICD-10-CM

## 2020-05-01 DIAGNOSIS — G8250 Quadriplegia, unspecified: Secondary | ICD-10-CM

## 2020-05-01 DIAGNOSIS — Z4659 Encounter for fitting and adjustment of other gastrointestinal appliance and device: Secondary | ICD-10-CM

## 2020-05-01 DIAGNOSIS — A4159 Other Gram-negative sepsis: Secondary | ICD-10-CM | POA: Diagnosis not present

## 2020-05-01 DIAGNOSIS — I959 Hypotension, unspecified: Secondary | ICD-10-CM | POA: Diagnosis not present

## 2020-05-01 DIAGNOSIS — Z1612 Extended spectrum beta lactamase (ESBL) resistance: Secondary | ICD-10-CM

## 2020-05-01 DIAGNOSIS — R7881 Bacteremia: Secondary | ICD-10-CM

## 2020-05-01 DIAGNOSIS — B182 Chronic viral hepatitis C: Secondary | ICD-10-CM

## 2020-05-01 LAB — BASIC METABOLIC PANEL
Anion gap: 15 (ref 5–15)
BUN: 70 mg/dL — ABNORMAL HIGH (ref 6–20)
CO2: 19 mmol/L — ABNORMAL LOW (ref 22–32)
Calcium: 8.2 mg/dL — ABNORMAL LOW (ref 8.9–10.3)
Chloride: 100 mmol/L (ref 98–111)
Creatinine, Ser: 2.87 mg/dL — ABNORMAL HIGH (ref 0.61–1.24)
GFR, Estimated: 26 mL/min — ABNORMAL LOW (ref >60)
Glucose, Bld: 108 mg/dL — ABNORMAL HIGH (ref 70–99)
Potassium: 3.1 mmol/L — ABNORMAL LOW (ref 3.5–5.1)
Sodium: 134 mmol/L — ABNORMAL LOW (ref 135–145)

## 2020-05-01 LAB — GLUCOSE, CAPILLARY
Glucose-Capillary: 103 mg/dL — ABNORMAL HIGH (ref 70–99)
Glucose-Capillary: 105 mg/dL — ABNORMAL HIGH (ref 70–99)
Glucose-Capillary: 110 mg/dL — ABNORMAL HIGH (ref 70–99)
Glucose-Capillary: 117 mg/dL — ABNORMAL HIGH (ref 70–99)
Glucose-Capillary: 128 mg/dL — ABNORMAL HIGH (ref 70–99)
Glucose-Capillary: 89 mg/dL (ref 70–99)

## 2020-05-01 LAB — CBC
HCT: 26 % — ABNORMAL LOW (ref 39.0–52.0)
Hemoglobin: 8.5 g/dL — ABNORMAL LOW (ref 13.0–17.0)
MCH: 28.8 pg (ref 26.0–34.0)
MCHC: 32.7 g/dL (ref 30.0–36.0)
MCV: 88.1 fL (ref 80.0–100.0)
Platelets: 179 10*3/uL (ref 150–400)
RBC: 2.95 MIL/uL — ABNORMAL LOW (ref 4.22–5.81)
RDW: 16.1 % — ABNORMAL HIGH (ref 11.5–15.5)
WBC: 8.9 10*3/uL (ref 4.0–10.5)
nRBC: 0 % (ref 0.0–0.2)

## 2020-05-01 MED ORDER — POTASSIUM CHLORIDE 10 MEQ/50ML IV SOLN: 10.0000 meq | INTRAVENOUS | Status: DC

## 2020-05-01 MED ORDER — PANTOPRAZOLE SODIUM 40 MG PO PACK
40.0000 mg | PACK | Freq: Every day | ORAL | Status: DC
  Administered 2020-05-02 – 2020-05-17 (×16): 40 mg
  Filled 2020-05-01 (×18): qty 20

## 2020-05-01 MED ORDER — POTASSIUM CHLORIDE 10 MEQ/100ML IV SOLN
10.0000 meq | INTRAVENOUS | Status: AC
  Administered 2020-05-01 (×4): 10 meq via INTRAVENOUS
  Filled 2020-05-01 (×4): qty 100

## 2020-05-01 MED ORDER — PROCHLORPERAZINE EDISYLATE 10 MG/2ML IJ SOLN
10.0000 mg | Freq: Four times a day (QID) | INTRAMUSCULAR | Status: DC | PRN
  Administered 2020-05-01 – 2020-05-27 (×35): 10 mg via INTRAVENOUS
  Filled 2020-05-01 (×40): qty 2

## 2020-05-01 NOTE — Progress Notes (Signed)
Unable to change dressings and bathe pt d/t nausea. QTc prolonged at 0.58 and unable to give antiemetics. Tube feeds turned off and PEG placed to low intermittent wall suction and PRN ativan given for anxiety. No improvement in nausea at this time. Will continue to monitor closely.

## 2020-05-01 NOTE — Progress Notes (Signed)
Received consult for sluggish blood return from PICC. Chest Xray shows tip at brachiocephalic. DL PICC power flushed (was also done on 2/24). Requested RN get order for chest Xray. If tip does not drop to SVC, recommend PICC exchange.

## 2020-05-01 NOTE — Progress Notes (Signed)
eLink Physician-Brief Progress Note Patient Name: Phillip Klein DOB: 1965/09/15 MRN: [id number]   Date of Service  05/01/2020  HPI/Events of Note  Hypokalemia - K+ = 3.1 and Creatinine = 2.87.  eICU Interventions  Will cautiously replace K+.     Intervention Category Major Interventions: Electrolyte abnormality - evaluation and management  [name] 05/01/2020, 6:37 AM

## 2020-05-01 NOTE — Consult Note (Signed)
[facility]    Date of Admission:  04/27/2020     Reason for Consult: ESBL bacteremia     Referring Physician: Critical care  Current antibiotics: Day 2 meropenem (2/25--present)  Previous antibiotics: Cefepime (2/24) Vancomycin (2/24)     ASSESSMENT:    1. ESBL [name] pneumonia bacteremia: Presumably from a urinary source after patient presented with sepsis requiring ICU admission, but also difficult to definitively rule out line related infection.  Currently stable, however, requiring low-dose norepinephrine over the last 24 hours.  Foley catheter has been exchanged and a repeat urine culture has been sent. 2. Presence of indwelling central line 3. Quadriplegia: Secondary to epidural abscess in the setting of MSSA bacteremia and history of injection drug use 4. Untreated hepatitis C  PLAN:    . Continue meropenem dosed per pharmacy . Follow-up susceptibility results . If feasible, would prefer removal of central line in the setting of ESBL bacteremia and sepsis with appropriate holiday before replacing any new lines  HPI:    Phillip Klein is a 55 y.o. male with past medical history significant for chronic ventilator dependence via tracheostomy after a prolonged hospitalization at [facility] from 09/03/2019--11/15/2019 where he was admitted for respiratory failure, septic shock, and MSSA bacteremia complicated by cervical and lumbar epidural abscesses resulting in quadriplegia.  He also had Candida krusei fungemia during that admission treated with micafungin.  His other history includes long standing substance use disorder including cocaine and heroin injection drug use, untreated hepatitis C, and prior staph aureus bloodstream infections.    Patient was admitted to the ICU on 2/24 with septic shock presumed to be from a urinary source after presenting with fevers, hypotension, and leukocytosis.  Urinalysis at that time was notable for  pyuria, rare bacteria, negative nitrites.  Urine culture was ordered but not sent to microbiology lab.  Chest x-ray through the emergency department also noted a dense left retrocardiac opacity concerning for left lower lobe collapse versus possible aspiration pneumonia.  Follow-up chest x-rays have shown relative stability and patient's ventilator settings have remained relatively stable with FiO2 40%..  Admission blood cultures are positive for [name] pneumonia with ESBL detected on BC ID.  He was initially treated with vancomycin and cefepime but this has been consolidated to meropenem after the blood culture results.  He has had improvement in his leukocytosis since admission, fever curve has improved.  Over the last 24 hours, he has required low-dose norepinephrine anywhere from 1 to 3 mcg, but has otherwise remained hemodynamically stable.  Patient has a chronic indwelling Foley catheter that was exchanged yesterday.  Additionally he has a right-sided PICC line present on admission.   PMHx: Chronic hypoxic respiratory failure  Hx of MRSA GERD Type 2 diabetes Anemia of chronic illness Osteomyelitis of cervical spine  Quadriplegia Spinal epidural abscess C2-C7 and T9-L4 Malnutrition Neurogenic astrostatic hypotension  HFrEF Untreated HCV Previous IVDU Cirrhosis  Chronic pain   History reviewed. No pertinent family history.  Allergies  Allergen Reactions  . Acetaminophen Other (See Comments)    "SHUTS DOWN MY ORGANS"  . Gabapentin Other (See Comments)    SUICIDAL IDEATION- Suicidal thoughts and actions     Review of Systems  Constitutional: Positive for fever.  Respiratory: Negative.   Cardiovascular: Negative.   Genitourinary: Negative.   Musculoskeletal: Negative.   Skin: Negative.   All other systems reviewed and are negative.   OBJECTIVE:   Blood pressure 120/77, pulse 80, temperature 98.2 F (  36.8 C), temperature source Axillary, resp. rate (!) 26, height 6'  2" (1.88 m), weight 104.8 kg, SpO2 96 %. Body mass index is 29.66 kg/m.  Physical Exam Constitutional:      Comments: Chronically ill-appearing man, lying in bed, no acute distress  HENT:     Head: Normocephalic and atraumatic.  Neck:     Comments: Tracheostomy in place Cardiovascular:     Rate and Rhythm: Normal rate and regular rhythm.     Comments: Right-sided [place]man without surrounding warmth or erythema Pulmonary:     Effort: Pulmonary effort is normal. No respiratory distress.     Comments: Ventilated breath sounds Abdominal:     General: There is no distension.     Palpations: Abdomen is soft.     Tenderness: There is no abdominal tenderness.  Genitourinary:    Comments: Foley catheter in place Musculoskeletal:     Comments: Generalized edema  Neurological:     General: No focal deficit present.     Mental Status: He is oriented to person, place, and time.  Psychiatric:        Mood and Affect: Mood normal.        Behavior: Behavior normal.      Lab Results: Lab Results  Component Value Date   WBC 8.9 05/01/2020   HGB 8.5 (L) 05/01/2020   HCT 26.0 (L) 05/01/2020   MCV 88.1 05/01/2020   PLT 179 05/01/2020    Lab Results  Component Value Date   NA 134 (L) 05/01/2020   K 3.1 (L) 05/01/2020   CO2 19 (L) 05/01/2020   GLUCOSE 108 (H) 05/01/2020   BUN 70 (H) 05/01/2020   CREATININE 2.87 (H) 05/01/2020   CALCIUM 8.2 (L) 05/01/2020   GFRNONAA 26 (L) 05/01/2020    Lab Results  Component Value Date   ALT 9 May 04, 2020   AST 20 May 04, 2020   ALKPHOS 85 May 04, 2020   BILITOT 0.6 May 04, 2020    No results found for: CRP  No results found for: ESRSEDRATE  I have reviewed the micro and lab results in Epic.  Imaging: DG ABDOMEN PEG TUBE LOCATION  Result Date: 04/30/2020 CLINICAL DATA:  Jejunostomy tube leak EXAM: ABDOMEN - 1 VIEW COMPARISON:  Portable exam 1103 hours without priors for comparison FINDINGS: Contrast was injected through the indwelling tube and  an image was obtained. Contrast opacifies what is likely the gastric lumen. No extravasation of contrast seen. Radiopaque material within transverse and descending colon question additional recent contrast administration. Lead less pacemaker projects over cardiac silhouette. Surgical clips in LEFT pelvis. IMPRESSION: Injected contrast material opacifies what appears to be stomach rather than jejunum, question gastrostomy tube not jejunostomy tube. No extravasated contrast identified. Additional apparent contrast material within transverse colon and descending colon, question additional recent contrast administration. Electronically Signed   By: Ulyses Southward M.D.   On: 04/30/2020 12:44   DG CHEST PORT 1 VIEW  Result Date: 05/01/2020 CLINICAL DATA:  54 year old male status post central line placement. EXAM: PORTABLE CHEST 1 VIEW COMPARISON:  04/30/2020 portable chest. FINDINGS: Portable AP upright view at 0549 hours. Right subclavian versus IJ approach central line catheter configuration is unchanged from yesterday, the catheter tip continues across midline and terminates in the left innominate vein as before. Stable tracheostomy. Partially visible posterior cervicothoracic junction spine fusion hardware. Small cardiac event recorder or superficial ICD. Stable cardiac size and mediastinal contours. Continued veiling and confluent bibasilar opacity. No pneumothorax. Pulmonary vascularity appears mildly improved. No overt edema in  the upper lungs. IMPRESSION: 1. Unchanged right side central line with tip at the left innominate vein level. Recommend repositioning to avoid innominate vein sclerosis. 2. Decreased pulmonary vascular congestion. Otherwise stable ventilation with evidence of bilateral pleural effusions with lower lobe collapse or consolidation. Electronically Signed   By: Odessa Fleming M.D.   On: 05/01/2020 06:27   DG CHEST PORT 1 VIEW  Result Date: 04/30/2020 CLINICAL DATA:  Pleural effusion EXAM: PORTABLE  CHEST 1 VIEW COMPARISON:  04/16/2020 FINDINGS: Tracheostomy tube terminates in the mid trachea, 4.7 cm from the carina. Right IJ approach central venous catheter courses to the level of the SVC, redirected superiorly and likely terminating within the left brachiocephalic vein. Consider repositioning. Telemetry leads overlie the chest. Stable appearance of the chest with Veiling opacity in the mid to lower lungs with obscuration of the hemidiaphragms likely reflecting a combination of atelectasis and layering pleural effusion though underlying airspace disease is difficult to fully exclude. Stable cardiomediastinal contours. No acute osseous or soft tissue abnormality. Prior cervical fusion incompletely assessed on this exam. IMPRESSION: 1. Veiling opacity in the mid to lower lungs likely reflecting a combination of atelectasis and layering pleural effusion. Underlying airspace disease is difficult to fully exclude. 2. Right IJ approach central venous catheter courses to the level of the SVC, redirected superiorly and likely terminating within the left brachiocephalic vein. Electronically Signed   By: Kreg Shropshire M.D.   On: 04/30/2020 05:02   DG Chest Portable 1 View  Result Date: 04/14/2020 CLINICAL DATA:  Hypotension. EXAM: PORTABLE CHEST 1 VIEW COMPARISON:  None. FINDINGS: Dense retrocardiac opacity. Right basilar atelectasis. Possible small left pleural effusion. No visible pneumothorax. Central venous catheter tip projects left of midline, most likely in the left brachiocephalic vein. Tracheostomy tube tip projects 3.7 cm above the carina. Partially imaged cervical ACDF. Mild enlargement the cardiac silhouette. Suspected loop recorder projects over the cardiac silhouette. IMPRESSION: 1. Dense left retrocardiac opacity, concerning for left lower lobe collapse. Aspiration or pneumonia is not excluded. Recommend follow-up to resolution. 2. Possible small left pleural effusion. 3. Central venous catheter tip  projects left of midline, most likely in the left brachiocephalic vein. Findings discussed with Sponseller via telephone at 4:35 PM. Electronically Signed   By: Feliberto Harts MD   On: 04/13/2020 16:38   DG Abd Portable 1V  Addendum Date: 04/30/2020   ADDENDUM REPORT: 04/30/2020 15:30 ADDENDUM: This examination was subsequently discussed with Dr. Nobie Putnam. He reports that the patient has a GJ tube with a single skin entrance in the epigastric area. In light of this information, the tip of the tube is likely in the descending duodenum as correlated with the earlier study. Contrast in the stomach may relate to the previous contrast injection or reflux. No tubing is seen within the jejunum. This could be further clarified under fluoroscopy or by CT as clinically warranted. Electronically Signed   By: Carey Bullocks M.D.   On: 04/30/2020 15:30   Result Date: 04/30/2020 CLINICAL DATA:  Injection of contrast into J-tube to confirm location EXAM: PORTABLE ABDOMEN - 1 VIEW COMPARISON:  Previous radiograph earlier the same date. FINDINGS: 1428 hours. The tube is unchanged from the earlier examination with the tip looped in the proximal stomach. Contrast has partially emptied from the stomach into the duodenum. Additional contrast is present in the transverse colon as noted previously. No contrast leak identified. Surgical clips are noted in the left pelvis. IMPRESSION: No significant change. The tip of the percutaneous tube  is looped in the proximal stomach. Electronically Signed: By: Carey BullocksWilliam  Veazey M.D. On: 04/30/2020 15:02   ECHOCARDIOGRAM LIMITED  Result Date: 04/30/2020    ECHOCARDIOGRAM LIMITED REPORT   Patient Name:   Sharlet SalinaBENJAMIN Naeem Date of Exam: 04/30/2020 Medical Rec #:  [id number]        Height:       74.0 in Accession #:    [id number]       Weight:       229.9 lb Date of Birth:  1965/11/10        BSA:          2.306 m Patient Age:    54 years         BP:           100/62 mmHg Patient Gender: M                 HR:           80 bpm. Exam Location:  Inpatient Procedure: Limited Echo, Cardiac Doppler and Color Doppler Indications:    CHF  History:        Patient has no prior history of Echocardiogram examinations.                 CHF. Septic shock, Resp, failure, IVDU, cirrhosis, quadriplegia.  Sonographer:    Lavenia AtlasBrooke Strickland Referring Phys: [phone] GRACE E BOWSER IMPRESSIONS  1. Left ventricular ejection fraction, by estimation, is 45 to 50%. The left ventricle has mildly decreased function. The left ventricle demonstrates global hypokinesis. Left ventricular diastolic parameters are consistent with Grade II diastolic dysfunction (pseudonormalization).  2. Right ventricular systolic function is moderately reduced.  3. The pericardial effusion is anterior to the right ventricle and circumferential. FINDINGS  Left Ventricle: Left ventricular ejection fraction, by estimation, is 45 to 50%. The left ventricle has mildly decreased function. The left ventricle demonstrates global hypokinesis. Left ventricular diastolic parameters are consistent with Grade II diastolic dysfunction (pseudonormalization). Right Ventricle: Limited echo for LV function. Right ventricular systolic function is moderately reduced. Pericardium: Trivial pericardial effusion is present. The pericardial effusion is anterior to the right ventricle and circumferential. Additional Comments: A pacer wire is visualized. LEFT VENTRICLE PLAX 2D LVIDd:         4.40 cm  Diastology LVIDs:         3.80 cm  LV e' medial:    4.57 cm/s LV PW:         1.10 cm  LV E/e' medial:  15.8 LV IVS:        1.10 cm  LV e' lateral:   4.35 cm/s LVOT diam:     2.80 cm  LV E/e' lateral: 16.6 LVOT Area:     6.16 cm  RIGHT VENTRICLE RV S prime:     7.40 cm/s LEFT ATRIUM         Index LA diam:    3.60 cm 1.56 cm/m   AORTA Ao Root diam: 3.60 cm MITRAL VALVE MV Area (PHT): 3.48 cm    SHUNTS MV Decel Time: 218 msec    Systemic Diam: 2.80 cm MV E velocity: 72.40 cm/s MV A velocity:  44.60 cm/s MV E/A ratio:  1.62 Donato SchultzMark Skains MD Electronically signed by Donato SchultzMark Skains MD Signature Date/Time: 04/30/2020/1:53:17 PM    Final    US EKG SITE RITE  Result Date: 05/01/2020 If Site Rite image not attached, placement could not be confirmed due to current cardiac rhythm.    Imaging independently reviewed  in Epic.  Vedia Coffer for Infectious Disease [facility] [phone] pager 05/01/2020, 1:03 PM

## 2020-05-01 NOTE — Progress Notes (Signed)
NAME:  Phillip Klein, MRN:  [id number], DOB:  April 04, 1965, LOS: 2 ADMISSION DATE:  06/05/2020, CONSULTATION DATE: 2/24 REFERRING MD: Dr. Silverio LayYao, CHIEF COMPLAINT: Sepsis  Brief History   54yo male who [facility] evaluation of hypotension. PCCM consulted for further management of sepsis and hypotension.    Past Medical History  Chronic hypoxic respiratory failure  Hx of MRSA GERD Type 2 diabetes Anemia of chronic illness Osteomyelitis of cervical spine  Quadriplegia Spinal epidural abscess C2-C7 and T9-L4 Malnutrition Neurogenic astrostatic hypotension  HFrEF Untreated HCV Previous IVDU Cirrhosis  Chronic pain   Significant Hospital Events   Admitted 2/24  Consults:  CCM  Procedures:  None  Significant Diagnostic Tests:  CXR 2/24-dense retrocardiac opacity concerning for left lower lobe collapse.  Aspiration or pneumonia is not excluded.  Recommend follow-up to resolution.  Possible small left pleural effusion  CXR 2/25-veiling opacity in the mid to lower lungs likely reflecting atelectasis and layering pleural effusion.  Underlying airspace disease difficult to exclude.  Right IJ approach central venous catheter courses to the level of the SVC.  Micro Data:  MRSA PCR screening-negative Respiratory panel-negative Blood cultures collected 2/24-pending Urine cultures collected 2/24-pending  Antimicrobials:  Cefepime-2/24>> 2/25 Vancomycin-2/24>> 2/25 Meropenem-2/25>>  Interim history/subjective:  Patient was transition from cefepime and Vanco to meropenem yesterday after discovering that his blood cultures were growing ESBL.  Continued to have issues with nausea and vomiting overnight.  He was given 1 dose of Compazine which he reports he is still having nausea.  Objective   Blood pressure 114/68, pulse 79, temperature (!) 101.3 F (38.5 C), temperature source Oral, resp. rate (!) 26, height 6\' 2"  (1.88 m), weight 104.8 kg, SpO2 98 %.     Vent Mode: PRVC FiO2 (%):  [40 %-70 %] 40 % Set Rate:  [26 bmp] 26 bmp Vt Set:  [510 mL] 510 mL PEEP:  [8 cmH20-10 cmH20] 8 cmH20 Plateau Pressure:  [18 cmH20-28 cmH20] 19 cmH20   Intake/Output Summary (Last 24 hours) at 05/01/2020 0730 Last data filed at 05/01/2020 0600 Gross per 24 hour  Intake 1193.45 ml  Output 520 ml  Net 673.45 ml   Filed Weights   02-02-2021 1610 04/30/20 0600 05/01/20 0500  Weight: 107.5 kg 104.3 kg 104.8 kg    Examination: General: Chronically ill-appearing 55 year old male, ventilated at this time HENT: Tracheostomy in place Lungs: Moving air well in the lungs bilaterally, ventilated breath sounds Cardiovascular: Regular rate and rhythm, no murmurs appreciated Abdomen: GJ tube in place, mild drainage around GJ tube. Extremities: Continued considerable amount of edema in upper and lower extremities.  Patient's wife reports that this is better than his baseline Neuro: Alert, awake, does not move arms or legs GU: Foley catheter in place, exchanged yesterday  Resolved Hospital Problem list     Assessment & Plan:   Phillip CoupeBenjamin Vannatter is a 55 y.o. with a pertinent PMH of chronic vent dependence via trach, osteomyelitis of cervical spine, quadriplegia, spinal epidural abscess, previous IVDU, cirrhosis, untreated HCV, T2DM, GERD, who presented with hypotension after J-tube replacement and admitted for sepsis.   Sepsis Patient presented meeting criteria for sepsis with hypothermia, tachypnea, elevated WBC with suspected source of infection.  Patient has history of MRSA bacteremia.  Unclear etiology of source of sepsis.  Most likely urinary in source.  Also concern given PICC line. -Admitted to the ICU on 2/24 -Foley catheter exchanged 04/30/2020 -Blood cultures growing ESBL.  Patient was transitioned from vancomycin and cefepime to  meropenem on 2/25 -Continue vent management -Careful fluid resuscitation given her heart failure -Continue map goal of greater than  65, required reinitiation of Levophed overnight. -Monitor urine output -Chest x-ray overnight showed central line tip was not centrally located and they recommend replacement.  AKI  We have no history of kidney issues.  Creatinine on arrival of 1.79 which increased to 2 and then was increased to 2.69 this morning.  Worsening to 0.87 today but appears to be plateauing.  Patient received gentle hydration given sepsis but was cautious due to right heart failure.  Most likely prerenal due to sepsis.  Was restarted on Levophed overnight. -Continue to monitor creatinines with morning BMPs -Cautious with hydration. -50 g of albumin daily -Consider diuresis if continues to worsen -Avoid nephrotoxic agents  Jejunostomy tube drainage Abdominal x-rays with contrast were completed yesterday showing that the GJ tube extends from the stomach down into the duodenum. -Feeding through J port at this time, medications through G ports  Chronic hypoxic respiratory failure Patient is trach dependent since becoming quadriplegic in 2021. -Continue vent support -Wean PEEP and FiO2 for sats greater than 90% -VAP bundle -Follow-up on cultures  Quadriplegia with history of spinal epidural abscess C2-C7 and T9-L4 Osteomyelitis of cervical spine Patient underwent C2-T2 2 posterior fusion and multiple laminectomies with autograft and [facility] at [facility]. -Supportive care  Multiple pressure ulcers Open abdominal wound S/p ex lap for G-tube malfunction -Wound care consulted -Pressure relieving devices -Optimize nutrition  Right heart failure Most recent echocardiogram on 10/08/2019 showed normal left ventricular systolic function with mild LVH.  It also showed mild right ventricular systolic dysfunction with trivial mitral regurg and tricuspid regurg. -Repeat echo has been ordered and results are pending at this time -Gentle hydration if needed but avoid if possible  Best Practice:  Diet: Tube  feeds Pain/Anxiety/Delirium protocol (if indicated): None VAP protocol (if indicated): Ordered DVT prophylaxis: Subcu heparin GI prophylaxis: PPI Glucose control: None Mobility: Bedbound Code Status: Full code Family Communication: Wife Disposition: ICU  Labs   CBC: Recent Labs  Lab 04/20/2020 1639 04/23/2020 1640 04/17/2020 2200 04/30/20 0545 05/01/20 0535  WBC  --   --  15.5* 11.6* 8.9  NEUTROABS  --   --  13.8*  --   --   HGB 5.1* 5.4* 7.2* 7.8* 8.5*  HCT 15.0* 16.0* 22.4* 23.5* 26.0*  MCV  --   --  91.4 87.7 88.1  PLT  --   --  179 161 179    Basic Metabolic Panel: Recent Labs  Lab 04/27/2020 1606 04/15/2020 1639 05/03/2020 1640 04/27/2020 2200 04/30/20 0545 05/01/20 0535  NA 132* 136 137  --  135 134*  K 2.5* 3.0* 2.9*  --  3.7 3.1*  CL 110  --  105  --  101 100  CO2 13*  --   --   --  20* 19*  GLUCOSE 258*  --  180*  --  74 108*  BUN 42*  --  61*  --  62* 70*  CREATININE 1.79*  --  2.00*  --  2.69* 2.87*  CALCIUM 5.0*  --   --   --  8.0* 8.2*  MG 1.0*  --   --  1.8 2.0  --   PHOS  --   --   --  7.5* 7.9*  --    GFR: Estimated Creatinine Clearance: 38.8 mL/min (A) (by C-G formula based on SCr of 2.87 mg/dL (H)). Recent Labs  Lab 05/01/2020 1606  05-12-2020 2200 04/30/20 0545 05/01/20 0535  PROCALCITON  --  34.80  --   --   WBC  --  15.5* 11.6* 8.9  LATICACIDVEN 0.7  --   --   --     Liver Function Tests: Recent Labs  Lab May 12, 2020 1606  AST 20  ALT 9  ALKPHOS 85  BILITOT 0.6  PROT 3.9*  ALBUMIN <1.0*   No results for input(s): LIPASE, AMYLASE in the last 168 hours. No results for input(s): AMMONIA in the last 168 hours.  ABG    Component Value Date/Time   HCO3 16.5 (L) 05-12-2020 1639   TCO2 18 (L) 05/12/2020 1640   ACIDBASEDEF 7.0 (H) 2020/05/12 1639   O2SAT 100.0 May 12, 2020 1639     Coagulation Profile: Recent Labs  Lab 05/12/20 2200  INR 1.4*    Cardiac Enzymes: No results for input(s): CKTOTAL, CKMB, CKMBINDEX, TROPONINI in the last 168  hours.  HbA1C: Hgb A1c MFr Bld  Date/Time Value Ref Range Status  12-May-2020 10:00 PM 5.1 4.8 - 5.6 % Final    Comment:    (NOTE) Pre diabetes:          5.7%-6.4%  Diabetes:              >6.4%  Glycemic control for   <7.0% adults with diabetes     CBG: Recent Labs  Lab 04/30/20 1641 04/30/20 2009 04/30/20 2050 04/30/20 2345 05/01/20 0334  GLUCAP 83 69* 99 77 89    Allergies Allergies  Allergen Reactions  . Acetaminophen Other (See Comments)    "SHUTS DOWN MY ORGANS"  . Gabapentin Other (See Comments)    SUICIDAL IDEATION- Suicidal thoughts and actions      Home Medications  Prior to Admission medications   Medication Sig Start Date End Date Taking? Authorizing Provider  acetaminophen (TYLENOL) 325 MG tablet Place 650 mg into feeding tube every 6 (six) hours as needed for mild pain.   Yes [provider]  alum & mag hydroxide-simeth (MAG-AL PLUS) 200-200-20 MG/5ML suspension Place 30 mLs into feeding tube every 6 (six) hours as needed (GERD WITH ESOPHAGITIS WITHOUT BLEEDING).   Yes [provider]  amiodarone (PACERONE) 200 MG tablet Place 200 mg into feeding tube in the morning.   Yes [provider]  amLODipine (NORVASC) 5 MG tablet Place 5 mg into feeding tube daily.   Yes [provider]  chlorhexidine (PERIDEX) 0.12 % solution 15 mLs by Mouth Rinse route 2 (two) times daily.   Yes [provider]  clonazePAM (KLONOPIN) 0.5 MG tablet Place 0.5 mg into feeding tube every 6 (six) hours as needed (for generalized anxiety disorder).   Yes [provider]  fentaNYL (DURAGESIC) 100 MCG/HR Place 1 patch onto the skin every 3 (three) days.   Yes [provider]  fentaNYL (DURAGESIC) 50 MCG/HR Place 1 patch onto the skin every 3 (three) days.   Yes [provider]  ferrous sulfate 220 (44 Fe) MG/5ML solution Place 220 mg into feeding tube in the morning and at bedtime.   Yes [provider]   fludrocortisone (FLORINEF) 0.1 MG tablet 0.2 mg See admin instructions. 0.2 mg, per G-Tube, every twelve hours   Yes [provider]  furosemide (LASIX) 40 MG tablet Place 40 mg into feeding tube in the morning.   Yes [provider]  ipratropium-albuterol (DUONEB) 0.5-2.5 (3) MG/3ML SOLN Take 3 mLs by nebulization every 4 (four) hours as needed (for acute and chronic respiratory  failure with hypercapnia).   Yes [provider]  lansoprazole (PREVACID SOLUTAB) 30 MG disintegrating tablet Place 30 mg into feeding tube in the morning.   Yes [provider]  levothyroxine (SYNTHROID) 125 MCG tablet Place 125 mcg into feeding tube in the morning.   Yes [provider]  loperamide (IMODIUM A-D) 2 MG tablet Place 2 mg into feeding tube every 8 (eight) hours as needed for diarrhea or loose stools.   Yes [provider]  Nutritional Supplements (FEEDING SUPPLEMENT, VITAL 1.5 CAL,) LIQD Place 50 mL/hr into feeding tube continuous.   Yes [provider]  ondansetron (ZOFRAN-ODT) 4 MG disintegrating tablet 4 mg See admin instructions. 4 mg, per G-Tube, every six hours as needed for nausea   Yes [provider]  Oxycodone HCl 10 MG TABS Place 10 mg into feeding tube every 6 (six) hours as needed (for severe pain).   Yes [provider]  PRESCRIPTION MEDICATION Inject 42 mL/hr into the vein See admin instructions. D10W (dextrose 10% in water): 42 ml's/hr "by shift"   Yes [provider]  sertraline (ZOLOFT) 100 MG tablet Place 100 mg into feeding tube daily.   Yes [provider]  Sodium Chloride Flush (NORMAL SALINE FLUSH) 0.9 % SOLN Inject 10 mLs into the vein See admin instructions. Flush with 10 ml's of normal saline before and after medications   Yes [provider]  traZODone (DESYREL) 100 MG tablet Place 100 mg into feeding tube at bedtime.   Yes [provider]     Derrel Nip, MD   PGY-2, [facility]  05/01/2020  7:30 AM

## 2020-05-01 NOTE — Progress Notes (Signed)
eLink Physician-Brief Progress Note Patient Name: Phillip Klein DOB: [birth year] MRN: [id number]   Date of Service  05/01/2020  HPI/Events of Note  Nausea - QTc interval prolonged = 0.54 seconds.   eICU Interventions  Plan: 1. D/C Zofran. 2. Compazine 10 mg IV Q 6 hours PRN N/V.     Intervention Category Major Interventions: Other:  Carlester Kasparek Dennard Nip 05/01/2020, 12:01 AM

## 2020-05-01 NOTE — Progress Notes (Addendum)
CXR unchanged after power flush of central line. Tip is not centrally located. Please consider replacement.

## 2020-05-02 DIAGNOSIS — R7881 Bacteremia: Secondary | ICD-10-CM

## 2020-05-02 DIAGNOSIS — I959 Hypotension, unspecified: Secondary | ICD-10-CM | POA: Diagnosis not present

## 2020-05-02 DIAGNOSIS — Z452 Encounter for adjustment and management of vascular access device: Secondary | ICD-10-CM | POA: Diagnosis not present

## 2020-05-02 DIAGNOSIS — B182 Chronic viral hepatitis C: Secondary | ICD-10-CM | POA: Diagnosis not present

## 2020-05-02 DIAGNOSIS — A4159 Other Gram-negative sepsis: Secondary | ICD-10-CM | POA: Diagnosis not present

## 2020-05-02 LAB — CULTURE, BLOOD (ROUTINE X 2)

## 2020-05-02 LAB — CBC WITH DIFFERENTIAL/PLATELET
Abs Immature Granulocytes: 0.03 10*3/uL (ref 0.00–0.07)
Basophils Absolute: 0 10*3/uL (ref 0.0–0.1)
Basophils Relative: 0 %
Eosinophils Absolute: 0.2 10*3/uL (ref 0.0–0.5)
Eosinophils Relative: 3 %
HCT: 23.7 % — ABNORMAL LOW (ref 39.0–52.0)
Hemoglobin: 7.7 g/dL — ABNORMAL LOW (ref 13.0–17.0)
Immature Granulocytes: 1 %
Lymphocytes Relative: 11 %
Lymphs Abs: 0.7 10*3/uL (ref 0.7–4.0)
MCH: 28.2 pg (ref 26.0–34.0)
MCHC: 32.5 g/dL (ref 30.0–36.0)
MCV: 86.8 fL (ref 80.0–100.0)
Monocytes Absolute: 0.3 10*3/uL (ref 0.1–1.0)
Monocytes Relative: 4 %
Neutro Abs: 5.1 10*3/uL (ref 1.7–7.7)
Neutrophils Relative %: 81 %
Platelets: 157 10*3/uL (ref 150–400)
RBC: 2.73 MIL/uL — ABNORMAL LOW (ref 4.22–5.81)
RDW: 16 % — ABNORMAL HIGH (ref 11.5–15.5)
WBC: 6.2 10*3/uL (ref 4.0–10.5)
nRBC: 0 % (ref 0.0–0.2)

## 2020-05-02 LAB — URINE CULTURE

## 2020-05-02 LAB — GLUCOSE, CAPILLARY
Glucose-Capillary: 100 mg/dL — ABNORMAL HIGH (ref 70–99)
Glucose-Capillary: 91 mg/dL (ref 70–99)
Glucose-Capillary: 92 mg/dL (ref 70–99)
Glucose-Capillary: 92 mg/dL (ref 70–99)
Glucose-Capillary: 96 mg/dL (ref 70–99)
Glucose-Capillary: 98 mg/dL (ref 70–99)

## 2020-05-02 LAB — COMPREHENSIVE METABOLIC PANEL
ALT: 9 U/L (ref 0–44)
AST: 14 U/L — ABNORMAL LOW (ref 15–41)
Albumin: 1.7 g/dL — ABNORMAL LOW (ref 3.5–5.0)
Alkaline Phosphatase: 97 U/L (ref 38–126)
Anion gap: 15 (ref 5–15)
BUN: 81 mg/dL — ABNORMAL HIGH (ref 6–20)
CO2: 18 mmol/L — ABNORMAL LOW (ref 22–32)
Calcium: 8.1 mg/dL — ABNORMAL LOW (ref 8.9–10.3)
Chloride: 101 mmol/L (ref 98–111)
Creatinine, Ser: 2.78 mg/dL — ABNORMAL HIGH (ref 0.61–1.24)
GFR, Estimated: 27 mL/min — ABNORMAL LOW (ref >60)
Glucose, Bld: 94 mg/dL (ref 70–99)
Potassium: 3.2 mmol/L — ABNORMAL LOW (ref 3.5–5.1)
Sodium: 134 mmol/L — ABNORMAL LOW (ref 135–145)
Total Bilirubin: 0.5 mg/dL (ref 0.3–1.2)
Total Protein: 6.1 g/dL — ABNORMAL LOW (ref 6.5–8.1)

## 2020-05-02 MED ORDER — OXYCODONE HCL 5 MG PO TABS
10.0000 mg | ORAL_TABLET | Freq: Four times a day (QID) | ORAL | Status: DC | PRN
  Administered 2020-05-02 – 2020-05-06 (×10): 10 mg
  Filled 2020-05-02 (×10): qty 2

## 2020-05-02 MED ORDER — SERTRALINE HCL 50 MG PO TABS
100.0000 mg | ORAL_TABLET | Freq: Every day | ORAL | Status: DC
  Administered 2020-05-02: 100 mg via ORAL
  Filled 2020-05-02: qty 2

## 2020-05-02 MED ORDER — CLONAZEPAM 0.5 MG PO TBDP
0.5000 mg | ORAL_TABLET | Freq: Two times a day (BID) | ORAL | Status: DC | PRN
  Administered 2020-05-02 – 2020-05-11 (×17): 0.5 mg
  Filled 2020-05-02 (×17): qty 1

## 2020-05-02 MED ORDER — TRAZODONE HCL 50 MG PO TABS
100.0000 mg | ORAL_TABLET | Freq: Every day | ORAL | Status: DC
  Administered 2020-05-02 – 2020-05-25 (×24): 100 mg
  Filled 2020-05-02 (×24): qty 2

## 2020-05-02 MED ORDER — MIDODRINE HCL 5 MG PO TABS
5.0000 mg | ORAL_TABLET | Freq: Three times a day (TID) | ORAL | Status: DC
  Administered 2020-05-02 – 2020-05-05 (×9): 5 mg via ORAL
  Filled 2020-05-02 (×9): qty 1

## 2020-05-02 MED ORDER — HEPARIN SODIUM (PORCINE) 5000 UNIT/ML IJ SOLN
5000.0000 [IU] | Freq: Three times a day (TID) | INTRAMUSCULAR | Status: DC
  Administered 2020-05-02 – 2020-05-11 (×27): 5000 [IU] via SUBCUTANEOUS
  Filled 2020-05-02 (×27): qty 1

## 2020-05-02 MED ORDER — SERTRALINE HCL 50 MG PO TABS
100.0000 mg | ORAL_TABLET | Freq: Every day | ORAL | Status: DC
  Administered 2020-05-03 – 2020-05-26 (×24): 100 mg
  Filled 2020-05-02 (×25): qty 2

## 2020-05-02 MED ORDER — TRAZODONE HCL 50 MG PO TABS: 100.0000 mg | ORAL_TABLET | Freq: Every day | ORAL | Status: DC

## 2020-05-02 MED ORDER — LACTATED RINGERS IV BOLUS
1000.0000 mL | Freq: Once | INTRAVENOUS | Status: AC
  Administered 2020-05-02: 1000 mL via INTRAVENOUS

## 2020-05-02 MED ORDER — OXYCODONE HCL 5 MG PO TABS
10.0000 mg | ORAL_TABLET | Freq: Four times a day (QID) | ORAL | Status: DC | PRN
  Administered 2020-05-02: 10 mg via ORAL
  Filled 2020-05-02: qty 2

## 2020-05-02 MED ORDER — POTASSIUM CHLORIDE 20 MEQ PO PACK
40.0000 meq | PACK | Freq: Once | ORAL | Status: AC
  Administered 2020-05-02: 40 meq
  Filled 2020-05-02: qty 2

## 2020-05-02 NOTE — Progress Notes (Signed)
NAME:  Phillip Klein, MRN:  [id number], DOB:  November 19, 1965, LOS: 3 ADMISSION DATE:  05-07-2020, CONSULTATION DATE:  2/27 REFERRING MD:  Silverio Lay, CHIEF COMPLAINT:  Sepsis   Brief History:  55yo male who presented from [facility] for evaluation of hypotension. PCCM consulted for further management of sepsis and hypotension.  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 Malnutrition Neurogenic astrostatic hypotension  HFrEF Untreated HCV Previous IVDU Cirrhosis  Chronic pain  Significant Hospital Events:  Admitted 2/24  Consults:  CCM ID  Procedures:  Trach present prior to admission Hickman catheter prior to admission > removed 2/26  Significant Diagnostic Tests:    Micro Data:  2/24 blood > [name] ESBL 1/4 bottle 2/26 urine > pending  Antimicrobials:  2/24 cefepime>  2/24 vanc >  2/25 mero >    Interim History / Subjective:   Off levophed Complains of back pain Hickman removed  Objective   Blood pressure 95/64, pulse 80, temperature (!) 97.5 F (36.4 C), temperature source Oral, resp. rate (!) 26, height 6\' 2"  (1.88 m), weight 104.7 kg, SpO2 95 %.    Vent Mode: PRVC FiO2 (%):  [40 %] 40 % Set Rate:  [26 bmp] 26 bmp Vt Set:  [510 mL] 510 mL PEEP:  [8 cmH20] 8 cmH20 Plateau Pressure:  [19 cmH20-21 cmH20] 20 cmH20   Intake/Output Summary (Last 24 hours) at 05/02/2020 0759 Last data filed at 05/02/2020 0600 Gross per 24 hour  Intake 1260.87 ml  Output 1550 ml  Net -289.13 ml   Filed Weights   04/30/20 0600 05/01/20 0500 05/02/20 0500  Weight: 104.3 kg 104.8 kg 104.7 kg    Examination:  General:  In bed on vent HENT: NCAT tracheostomy in place PULM: CTA B, vent supported breathing CV: RRR, no mgr GI: BS+, PEG, wound dressing c/d/i MSK: normal bulk and tone Neuro: awake, alert,  no muscle tone or movement in arms/legs    Resolved Hospital Problem list     Assessment & Plan:  [name] bacteremia from UTI Continue meropenem Line holiday, plan replace PICC vs Hickman on 2/28 then d/c home  AKI Labs pending today Monitor BMET and UOP Replace electrolytes as needed  Jejunostomy tube drainage> improved Continue feeding through J tube  Septic shock> resolved but still has some hypotension likely related to dysautonomia Monitor hemodynamics Midodrine start today Continue florinef  Chronic respiratory failure with hypoxemia Continue full vent support  Multiple pressure ulcers/wounds present on admission Wound care  Right heat failure Minimize volume resuscitation  Chronic pain Continue fentanyl patch Restart home oxycodone  Depression, insomnia Sertraline, trazodone restart per home doses Clonazepam to continue  GERD  Pantoprazole   Best practice (evaluated daily)  Diet: tube feeding Pain/Anxiety/Delirium protocol (if indicated): n/a VAP protocol (if indicated): yes DVT prophylaxis: start sub q heparin GI prophylaxis: n/a Glucose control: SSI Mobility: bed rest Disposition:here until 3/1 then back to [facility]  Goals of Care:  Last date of multidisciplinary goals of care discussion:2/25 Family and staff present: wife, bedside nurse, patient, 3/28 Summary of discussion: full scope of practice, "keep me alive at all costs" Follow up goals of care discussion due: 3/3 Code Status: full  Labs   CBC: Recent Labs  Lab May 07, 2020 1639 May 07, 2020 1640 05-07-2020 2200 04/30/20 0545 05/01/20 0535  WBC  --   --  15.5* 11.6* 8.9  NEUTROABS  --   --  13.8*  --   --   HGB 5.1* 5.4* 7.2* 7.8* 8.5*  HCT 15.0* 16.0* 22.4* 23.5* 26.0*  MCV  --   --  91.4 87.7 88.1  PLT  --   --  179 161 179    Basic Metabolic Panel: Recent Labs  Lab 2020-05-11 1606 05/11/2020 1639 05/11/20 1640 05-11-2020 2200 04/30/20 0545 05/01/20 0535  NA 132* 136 137   --  135 134*  K 2.5* 3.0* 2.9*  --  3.7 3.1*  CL 110  --  105  --  101 100  CO2 13*  --   --   --  20* 19*  GLUCOSE 258*  --  180*  --  74 108*  BUN 42*  --  61*  --  62* 70*  CREATININE 1.79*  --  2.00*  --  2.69* 2.87*  CALCIUM 5.0*  --   --   --  8.0* 8.2*  MG 1.0*  --   --  1.8 2.0  --   PHOS  --   --   --  7.5* 7.9*  --    GFR: Estimated Creatinine Clearance: 38.8 mL/min (A) (by C-G formula based on SCr of 2.87 mg/dL (H)). Recent Labs  Lab 05-11-2020 1606 11-May-2020 2200 04/30/20 0545 05/01/20 0535  PROCALCITON  --  34.80  --   --   WBC  --  15.5* 11.6* 8.9  LATICACIDVEN 0.7  --   --   --     Liver Function Tests: Recent Labs  Lab 05/11/20 1606  AST 20  ALT 9  ALKPHOS 85  BILITOT 0.6  PROT 3.9*  ALBUMIN <1.0*   No results for input(s): LIPASE, AMYLASE in the last 168 hours. No results for input(s): AMMONIA in the last 168 hours.  ABG    Component Value Date/Time   HCO3 16.5 (L) 05/11/2020 1639   TCO2 18 (L) May 11, 2020 1640   ACIDBASEDEF 7.0 (H) 05/11/2020 1639   O2SAT 100.0 05-11-20 1639     Coagulation Profile: Recent Labs  Lab 2020/05/11 2200  INR 1.4*    Cardiac Enzymes: No results for input(s): CKTOTAL, CKMB, CKMBINDEX, TROPONINI in the last 168 hours.  HbA1C: Hgb A1c MFr Bld  Date/Time Value Ref Range Status  05-11-2020 10:00 PM 5.1 4.8 - 5.6 % Final    Comment:    (NOTE) Pre diabetes:          5.7%-6.4%  Diabetes:              >6.4%  Glycemic control for   <7.0% adults with diabetes     CBG: Recent Labs  Lab 05/01/20 1219 05/01/20 1602 05/01/20 2021 05/01/20 2338 05/02/20 0341  GLUCAP 105* 128* 117* 103* 98     Critical care time: n/a     Heber Berlin, MD [place] [facility] Pager: [phone] Cell: [phone] If no response, please call [phone] until 7pm After 7:00 pm call Elink  [phone]

## 2020-05-03 DIAGNOSIS — D649 Anemia, unspecified: Secondary | ICD-10-CM | POA: Diagnosis not present

## 2020-05-03 DIAGNOSIS — K9413 Enterostomy malfunction: Secondary | ICD-10-CM

## 2020-05-03 DIAGNOSIS — L89214 Pressure ulcer of right hip, stage 4: Secondary | ICD-10-CM | POA: Diagnosis not present

## 2020-05-03 DIAGNOSIS — I959 Hypotension, unspecified: Secondary | ICD-10-CM | POA: Diagnosis not present

## 2020-05-03 DIAGNOSIS — L89894 Pressure ulcer of other site, stage 4: Secondary | ICD-10-CM | POA: Diagnosis not present

## 2020-05-03 DIAGNOSIS — J90 Pleural effusion, not elsewhere classified: Secondary | ICD-10-CM

## 2020-05-03 DIAGNOSIS — R6521 Severe sepsis with septic shock: Secondary | ICD-10-CM | POA: Diagnosis not present

## 2020-05-03 DIAGNOSIS — A4189 Other specified sepsis: Secondary | ICD-10-CM | POA: Diagnosis not present

## 2020-05-03 DIAGNOSIS — R7881 Bacteremia: Secondary | ICD-10-CM | POA: Diagnosis not present

## 2020-05-03 DIAGNOSIS — B182 Chronic viral hepatitis C: Secondary | ICD-10-CM | POA: Diagnosis not present

## 2020-05-03 DIAGNOSIS — E440 Moderate protein-calorie malnutrition: Secondary | ICD-10-CM

## 2020-05-03 DIAGNOSIS — A4159 Other Gram-negative sepsis: Secondary | ICD-10-CM | POA: Diagnosis not present

## 2020-05-03 DIAGNOSIS — L89023 Pressure ulcer of left elbow, stage 3: Secondary | ICD-10-CM | POA: Diagnosis not present

## 2020-05-03 LAB — CBC WITH DIFFERENTIAL/PLATELET
Abs Immature Granulocytes: 0.03 10*3/uL (ref 0.00–0.07)
Basophils Absolute: 0 10*3/uL (ref 0.0–0.1)
Basophils Relative: 0 %
Eosinophils Absolute: 0.4 10*3/uL (ref 0.0–0.5)
Eosinophils Relative: 6 %
HCT: 22.1 % — ABNORMAL LOW (ref 39.0–52.0)
Hemoglobin: 7.3 g/dL — ABNORMAL LOW (ref 13.0–17.0)
Immature Granulocytes: 1 %
Lymphocytes Relative: 19 %
Lymphs Abs: 1.2 10*3/uL (ref 0.7–4.0)
MCH: 28.5 pg (ref 26.0–34.0)
MCHC: 33 g/dL (ref 30.0–36.0)
MCV: 86.3 fL (ref 80.0–100.0)
Monocytes Absolute: 0.4 10*3/uL (ref 0.1–1.0)
Monocytes Relative: 6 %
Neutro Abs: 4.4 10*3/uL (ref 1.7–7.7)
Neutrophils Relative %: 68 %
Platelets: 113 10*3/uL — ABNORMAL LOW (ref 150–400)
RBC: 2.56 MIL/uL — ABNORMAL LOW (ref 4.22–5.81)
RDW: 15.9 % — ABNORMAL HIGH (ref 11.5–15.5)
WBC: 6.4 10*3/uL (ref 4.0–10.5)
nRBC: 0 % (ref 0.0–0.2)

## 2020-05-03 LAB — COMPREHENSIVE METABOLIC PANEL
ALT: 8 U/L (ref 0–44)
AST: 13 U/L — ABNORMAL LOW (ref 15–41)
Albumin: 2 g/dL — ABNORMAL LOW (ref 3.5–5.0)
Alkaline Phosphatase: 100 U/L (ref 38–126)
Anion gap: 14 (ref 5–15)
BUN: 86 mg/dL — ABNORMAL HIGH (ref 6–20)
CO2: 19 mmol/L — ABNORMAL LOW (ref 22–32)
Calcium: 8.3 mg/dL — ABNORMAL LOW (ref 8.9–10.3)
Chloride: 102 mmol/L (ref 98–111)
Creatinine, Ser: 2.8 mg/dL — ABNORMAL HIGH (ref 0.61–1.24)
GFR, Estimated: 26 mL/min — ABNORMAL LOW (ref >60)
Glucose, Bld: 96 mg/dL (ref 70–99)
Potassium: 3.5 mmol/L (ref 3.5–5.1)
Sodium: 135 mmol/L (ref 135–145)
Total Bilirubin: 0.9 mg/dL (ref 0.3–1.2)
Total Protein: 6.2 g/dL — ABNORMAL LOW (ref 6.5–8.1)

## 2020-05-03 LAB — BPAM RBC
Blood Product Expiration Date: 202203072359
Blood Product Expiration Date: 202203142359
Blood Product Expiration Date: 202203142359
ISSUE DATE / TIME: 202202210835
ISSUE DATE / TIME: 202202242359
Unit Type and Rh: 7300
Unit Type and Rh: 7300
Unit Type and Rh: 7300

## 2020-05-03 LAB — GLUCOSE, CAPILLARY
Glucose-Capillary: 91 mg/dL (ref 70–99)
Glucose-Capillary: 90 mg/dL (ref 70–99)
Glucose-Capillary: 88 mg/dL (ref 70–99)
Glucose-Capillary: 105 mg/dL — ABNORMAL HIGH (ref 70–99)
Glucose-Capillary: 90 mg/dL (ref 70–99)
Glucose-Capillary: 105 mg/dL — ABNORMAL HIGH (ref 70–99)

## 2020-05-03 LAB — TYPE AND SCREEN
ABO/RH(D): B POS
Antibody Screen: NEGATIVE
Unit division: 0
Unit division: 0
Unit division: 0

## 2020-05-03 MED ORDER — SODIUM CHLORIDE 0.9 % IV SOLN
1.0000 g | Freq: Two times a day (BID) | INTRAVENOUS | Status: DC
  Administered 2020-05-03 – 2020-05-10 (×15): 1 g via INTRAVENOUS
  Filled 2020-05-03 (×18): qty 1

## 2020-05-03 MED FILL — Fentanyl Citrate Preservative Free (PF) Inj 100 MCG/2ML: INTRAMUSCULAR | Qty: 2 | Status: AC

## 2020-05-03 NOTE — Progress Notes (Addendum)
Subjective: Complaining of some abdominal pain   Antibiotics:  Anti-infectives (From admission, onward)   Start     Dose/Rate Route Frequency Ordered Stop   05/03/20 2200  meropenem (MERREM) 1 g in sodium chloride 0.9 % 100 mL IVPB        1 g 200 mL/hr over 30 Minutes Intravenous Every 12 hours 05/03/20 1041     04/30/20 1145  meropenem (MERREM) 2 g in sodium chloride 0.9 % 100 mL IVPB  Status:  Discontinued        2 g 200 mL/hr over 30 Minutes Intravenous Every 12 hours 04/30/20 1052 05/03/20 1041   04/30/20 0530  ceFEPIme (MAXIPIME) 2 g in sodium chloride 0.9 % 100 mL IVPB  Status:  Discontinued        2 g 200 mL/hr over 30 Minutes Intravenous Every 12 hours 05/01/2020 1852 04/30/20 1051   05/03/2020 1852  vancomycin variable dose per unstable renal function (pharmacist dosing)  Status:  Discontinued         Does not apply See admin instructions 04/10/2020 1852 04/30/20 1051   04/26/2020 1630  vancomycin (VANCOREADY) IVPB 2000 mg/400 mL        2,000 mg 200 mL/hr over 120 Minutes Intravenous  Once 04/18/2020 1621 04/25/2020 2008   04/27/2020 1630  ceFEPIme (MAXIPIME) 2 g in sodium chloride 0.9 % 100 mL IVPB        2 g 200 mL/hr over 30 Minutes Intravenous  Once 04/30/2020 1621 04/27/2020 1749      Medications: Scheduled Meds: . chlorhexidine gluconate (MEDLINE KIT)  15 mL Mouth Rinse BID  . Chlorhexidine Gluconate Cloth  6 each Topical Daily  . feeding supplement (PROSource TF)  90 mL Per Tube TID  . fentaNYL  1 patch Transdermal Q72H   And  . fentaNYL  1 patch Transdermal Q72H  . fludrocortisone  0.2 mg Per Tube BID  . heparin injection (subcutaneous)  5,000 Units Subcutaneous Q8H  . insulin aspart  0-9 Units Subcutaneous Q4H  . levothyroxine  125 mcg Per Tube Q0600  . mouth rinse  15 mL Mouth Rinse 10 times per day  . midodrine  5 mg Oral TID WC  . multivitamin with minerals  1 tablet Per Tube Daily  . nutrition supplement (JUVEN)  1 packet Per Tube BID BM  . pantoprazole  sodium  40 mg Per Tube Daily  . sertraline  100 mg Per Tube Daily  . traZODone  100 mg Per Tube QHS   Continuous Infusions: . sodium chloride Stopped (04/07/2020 1808)  . albumin human 60 mL/hr at 05/03/20 1200  . feeding supplement (OSMOLITE 1.5 CAL) 1,000 mL (05/03/20 1005)  . meropenem (MERREM) IV    . norepinephrine (LEVOPHED) Adult infusion Stopped (05/01/20 2141)   PRN Meds:.clonazePAM, docusate, fentaNYL (SUBLIMAZE) injection, oxyCODONE, polyethylene glycol, prochlorperazine    Objective: Weight change: 2.3 kg  Intake/Output Summary (Last 24 hours) at 05/03/2020 1237 Last data filed at 05/03/2020 1200 Gross per 24 hour  Intake 2040.56 ml  Output 2860 ml  Net -819.44 ml   Blood pressure 100/67, pulse 80, temperature 98.2 F (36.8 C), temperature source Oral, resp. rate (!) 26, height '6\' 2"'  (1.88 m), weight 107 kg, SpO2 97 %. Temp:  [98 F (36.7 C)-98.5 F (36.9 C)] 98.2 F (36.8 C) (02/28 1108) Pulse Rate:  [79-92] 80 (02/28 1200) Resp:  [22-26] 26 (02/28 1200) BP: (93-108)/(57-73) 100/67 (02/28 1200) SpO2:  [96 %-99 %] 97 % (  02/28 1200) FiO2 (%):  [40 %] 40 % (02/28 1124) Weight:  [107 kg] 107 kg (02/28 0500)  Physical Exam: Physical Exam HENT:     Head: Normocephalic and atraumatic.  Cardiovascular:     Rate and Rhythm: Normal rate and regular rhythm.     Heart sounds: No murmur heard.   Pulmonary:     Effort: No respiratory distress.  Abdominal:     General: Abdomen is flat.  Psychiatric:        Mood and Affect: Mood is depressed.        Behavior: Behavior is cooperative.        Cognition and Memory: Cognition normal.    Quadriplegic  CBC:    BMET Recent Labs    05/02/20 1006 05/03/20 0141  NA 134* 135  K 3.2* 3.5  CL 101 102  CO2 18* 19*  GLUCOSE 94 96  BUN 81* 86*  CREATININE 2.78* 2.80*  CALCIUM 8.1* 8.3*     Liver Panel  Recent Labs    05/02/20 1006 05/03/20 0141  PROT 6.1* 6.2*  ALBUMIN 1.7* 2.0*  AST 14* 13*  ALT 9 8   ALKPHOS 97 100  BILITOT 0.5 0.9       Sedimentation Rate No results for input(s): ESRSEDRATE in the last 72 hours. C-Reactive Protein No results for input(s): CRP in the last 72 hours.  Micro Results: Recent Results (from the past 720 hour(s))  Blood culture (routine x 2)     Status: Abnormal   Collection Time: 04/19/2020  5:20 PM   Specimen: BLOOD RIGHT HAND  Result Value Ref Range Status   Specimen Description BLOOD RIGHT HAND  Final   Special Requests   Final    BOTTLES DRAWN AEROBIC AND ANAEROBIC Blood Culture results may not be optimal due to an inadequate volume of blood received in culture bottles   Culture  Setup Time   Final    GRAM NEGATIVE RODS AEROBIC BOTTLE ONLY CRITICAL RESULT CALLED TO, READ BACK BY AND VERIFIED WITH: PHARMD AUSTIN P [phone] FCP Performed at [facility], [place] [address], [place], [place] [address]    Culture (A)  Final    [name] PNEUMONIAE Confirmed Extended Spectrum Beta-Lactamase Producer (ESBL).  In bloodstream infections from ESBL organisms, carbapenems are preferred over piperacillin/tazobactam. They are shown to have a lower risk of mortality.    Report Status 05/02/2020 FINAL  Final   Organism ID, Bacteria [name] PNEUMONIAE  Final      Susceptibility   [name] pneumoniae - MIC*    AMPICILLIN >=32 RESISTANT Resistant     CEFAZOLIN >=64 RESISTANT Resistant     CEFEPIME >=32 RESISTANT Resistant     CEFTAZIDIME RESISTANT Resistant     CEFTRIAXONE >=64 RESISTANT Resistant     CIPROFLOXACIN >=4 RESISTANT Resistant     GENTAMICIN >=16 RESISTANT Resistant     IMIPENEM <=0.25 SENSITIVE Sensitive     TRIMETH/SULFA >=320 RESISTANT Resistant     AMPICILLIN/SULBACTAM >=32 RESISTANT Resistant     PIP/TAZO 32 INTERMEDIATE Intermediate     * [name] PNEUMONIAE  Blood Culture ID Panel (Reflexed)     Status: Abnormal   Collection Time: 05/01/2020  5:20 PM  Result Value Ref Range Status   Enterococcus faecalis NOT DETECTED  NOT DETECTED Final   Enterococcus Faecium NOT DETECTED NOT DETECTED Final   Listeria monocytogenes NOT DETECTED NOT DETECTED Final   Staphylococcus species NOT DETECTED NOT DETECTED Final   Staphylococcus aureus (BCID) NOT  DETECTED NOT DETECTED Final   Staphylococcus epidermidis NOT DETECTED NOT DETECTED Final   Staphylococcus lugdunensis NOT DETECTED NOT DETECTED Final   Streptococcus species NOT DETECTED NOT DETECTED Final   Streptococcus agalactiae NOT DETECTED NOT DETECTED Final   Streptococcus pneumoniae NOT DETECTED NOT DETECTED Final   Streptococcus pyogenes NOT DETECTED NOT DETECTED Final   A.calcoaceticus-baumannii NOT DETECTED NOT DETECTED Final   Bacteroides fragilis NOT DETECTED NOT DETECTED Final   Enterobacterales DETECTED (A) NOT DETECTED Final    Comment: Enterobacterales represent a large order of gram negative bacteria, not a single organism. CRITICAL RESULT CALLED TO, READ BACK BY AND VERIFIED WITH: PHARMD AUSTIN P [phone] FCP    Enterobacter cloacae complex NOT DETECTED NOT DETECTED Final   Escherichia coli NOT DETECTED NOT DETECTED Final   [name] aerogenes NOT DETECTED NOT DETECTED Final   [name] oxytoca NOT DETECTED NOT DETECTED Final   [name] pneumoniae DETECTED (A) NOT DETECTED Final    Comment: CRITICAL RESULT CALLED TO, READ BACK BY AND VERIFIED WITH: PHARMD AUSTIN P [phone] FCP    Proteus species NOT DETECTED NOT DETECTED Final   Salmonella species NOT DETECTED NOT DETECTED Final   Serratia marcescens NOT DETECTED NOT DETECTED Final   Haemophilus influenzae NOT DETECTED NOT DETECTED Final   Neisseria meningitidis NOT DETECTED NOT DETECTED Final   Pseudomonas aeruginosa NOT DETECTED NOT DETECTED Final   Stenotrophomonas maltophilia NOT DETECTED NOT DETECTED Final   Candida albicans NOT DETECTED NOT DETECTED Final   Candida auris NOT DETECTED NOT DETECTED Final   Candida glabrata NOT DETECTED NOT DETECTED Final   Candida krusei NOT  DETECTED NOT DETECTED Final   Candida parapsilosis NOT DETECTED NOT DETECTED Final   Candida tropicalis NOT DETECTED NOT DETECTED Final   Cryptococcus neoformans/gattii NOT DETECTED NOT DETECTED Final   CTX-M ESBL DETECTED (A) NOT DETECTED Final    Comment: CRITICAL RESULT CALLED TO, READ BACK BY AND VERIFIED WITH: PHARMD AUSTIN P [phone] FCP (NOTE) Extended spectrum beta-lactamase detected. Recommend a carbapenem as initial therapy.      Carbapenem resistance IMP NOT DETECTED NOT DETECTED Final   Carbapenem resistance KPC NOT DETECTED NOT DETECTED Final   Carbapenem resistance NDM NOT DETECTED NOT DETECTED Final   Carbapenem resist OXA 48 LIKE NOT DETECTED NOT DETECTED Final   Carbapenem resistance VIM NOT DETECTED NOT DETECTED Final    Comment: Performed at [facility], [place] [address], [place], [place] [address]  Resp Panel by RT-PCR (Flu A&B, Covid) Nasopharyngeal Swab     Status: None   Collection Time: 04/10/2020  5:51 PM   Specimen: Nasopharyngeal Swab; Nasopharyngeal(NP) swabs in vial transport medium  Result Value Ref Range Status   SARS Coronavirus 2 by RT PCR NEGATIVE NEGATIVE Final    Comment: (NOTE) SARS-CoV-2 target nucleic acids are NOT DETECTED.  The SARS-CoV-2 RNA is generally detectable in upper respiratory specimens during the acute phase of infection. The lowest concentration of SARS-CoV-2 viral copies this assay can detect is 138 copies/mL. A negative result does not preclude SARS-Cov-2 infection and should not be used as the sole basis for treatment or other patient management decisions. A negative result may occur with  improper specimen collection/handling, submission of specimen other than nasopharyngeal swab, presence of viral mutation(s) within the areas targeted by this assay, and inadequate number of viral copies(<138 copies/mL). A negative result must be combined with clinical observations, patient history, and epidemiological information.  The expected result is Negative.  Fact Sheet  for Patients:  EntrepreneurPulse.com.au  Fact Sheet for Healthcare Providers:  IncredibleEmployment.be  This test is no t yet approved or cleared by the [place] FDA and  has been authorized for detection and/or diagnosis of SARS-CoV-2 by FDA under an Emergency Use Authorization (EUA). This EUA will remain  in effect (meaning this test can be used) for the duration of the COVID-19 declaration under Section 564(b)(1) of the Act, 21 U.S.C.section 360bbb-3(b)(1), unless the authorization is terminated  or revoked sooner.       Influenza A by PCR NEGATIVE NEGATIVE Final   Influenza B by PCR NEGATIVE NEGATIVE Final    Comment: (NOTE) The Xpert Xpress SARS-CoV-2/FLU/RSV plus assay is intended as an aid in the diagnosis of influenza from Nasopharyngeal swab specimens and should not be used as a sole basis for treatment. Nasal washings and aspirates are unacceptable for Xpert Xpress SARS-CoV-2/FLU/RSV testing.  Fact Sheet for Patients: EntrepreneurPulse.com.au  Fact Sheet for Healthcare Providers: IncredibleEmployment.be  This test is not yet approved or cleared by the [place] FDA and has been authorized for detection and/or diagnosis of SARS-CoV-2 by FDA under an Emergency Use Authorization (EUA). This EUA will remain in effect (meaning this test can be used) for the duration of the COVID-19 declaration under Section 564(b)(1) of the Act, 21 U.S.C. section 360bbb-3(b)(1), unless the authorization is terminated or revoked.  Performed at [facility], [place] [address], [place], [place] [address]   MRSA PCR Screening     Status: None   Collection Time: 04/12/2020  8:08 PM   Specimen: Nasopharyngeal  Result Value Ref Range Status   MRSA by PCR NEGATIVE NEGATIVE Final    Comment:        The GeneXpert MRSA Assay (FDA approved for NASAL specimens only),  is one component of a comprehensive MRSA colonization surveillance program. It is not intended to diagnose MRSA infection nor to guide or monitor treatment for MRSA infections. Performed at [facility], [place] [address] [place], [place] [address]   Culture, blood (Routine X 2) w Reflex to ID Panel     Status: None (Preliminary result)   Collection Time: 04/30/20  1:41 AM   Specimen: BLOOD RIGHT HAND  Result Value Ref Range Status   Specimen Description BLOOD RIGHT HAND  Final   Special Requests AEROBIC BOTTLE ONLY Blood Culture adequate volume  Final   Culture   Final    NO GROWTH 2 DAYS Performed at [facility], [place] [address], [place], [place] [address]    Report Status PENDING  Incomplete  Urine Culture     Status: Abnormal   Collection Time: 05/01/20  3:42 PM   Specimen: Urine, Random  Result Value Ref Range Status   Specimen Description URINE, RANDOM  Final   Special Requests   Final    NONE Performed at [facility], [place] [address], [place], [place] [address]    Culture MULTIPLE SPECIES PRESENT, SUGGEST RECOLLECTION (A)  Final   Report Status 05/02/2020 FINAL  Final    Studies/Results: No results found.    Assessment/Plan:  INTERVAL HISTORY: PICC line has been removed   Principal Problem:   Gram-negative bacteremia Active Problems:   Septic shock (HCC)   Pressure injury of skin   Malnutrition of moderate degree   Encounter for assessment of peripherally inserted central catheter (PICC)   Hypotension   Quadriplegia (HCC)   Infection due to ESBL-producing [name] pneumoniae   Chronic hepatitis C  without hepatic coma ([place])    Phillip Klein is a 55 y.o. male with gated past medical history including IV drug use with MSSA bacteremia and cervical lumbar epidural abscesses causing quadriplegia.  Is also been ventilator dependent with need for tracheostomy.  Had a complicated course at [facility] during which he developed Candida  Cruz fungal EMEA.  He has a history of hepatitis C that has not been treated.  He was admitted to the ICU on 24 February with septic shock thought to be due to from a urinary source.  Was hypotensive urinalysis showed pyuria and rare bacteria.  Urine culture was ordered but not yet sent to the microbiology lab.  Blood cultures were taken and he was found to have ESBL bacteremia   Has been on meropenem.  Repeat blood cultures were taken and then he had his central line removed yesterday.  Whether or not his line was a source of infection it was prudent to remove it in the context of this ESBL [name] pneumonia bacteremia.  I would give him at least a 10-day course of Carbapenem therapy with day #1 being today.c (treatent for line infection)   Untreated hepatitis C if he needs to be treated for this he could be referred to [place] as an outpatient  We will sign off for now please call if further questions.   LOS: 4 days   Alcide Evener 05/03/2020, 12:37 PM

## 2020-05-03 NOTE — Progress Notes (Signed)
Pharmacy Antibiotic Note  Phillip Klein is a 55 y.o. male admitted on 2020/05/26 with sepsis 2/2 to ESBL [name] bacteremia (likely urinary source).  Pharmacy has been consulted for meropenem dosing.  Patient previously on vancomycin and cefepime sepsis now optimized to meropenem for ESBL bacteremia. Patients Scr 2.8 and continues to increase. Current CrCl~40.  Pharmacy will continue to monitor renal function as patient currently experiencing AKI. Patient most likely only needs 1G Q12H per BCID policy and renal function.   Plan: Reduce to meropenem 1g q12hr Monitor renal function, cultures and further recommendations  Height: 6\' 2"  (188 cm) Weight: 107 kg (235 lb 14.3 oz) IBW/kg (Calculated) : 82.2  Temp (24hrs), Avg:98.2 F (36.8 C), Min:98 F (36.7 C), Max:98.5 F (36.9 C)  Recent Labs  Lab 05/26/2020 1606 05-26-2020 1640 2020-05-26 2200 04/30/20 0545 05/01/20 0535 05/02/20 1006 05/03/20 0141  WBC  --   --  15.5* 11.6* 8.9 6.2 6.4  CREATININE 1.79* 2.00*  --  2.69* 2.87* 2.78* 2.80*  LATICACIDVEN 0.7  --   --   --   --   --   --     Estimated Creatinine Clearance: 40.2 mL/min (A) (by C-G formula based on SCr of 2.8 mg/dL (H)).    Allergies  Allergen Reactions  . Acetaminophen Other (See Comments)    "SHUTS DOWN MY ORGANS"  . Gabapentin Other (See Comments)    SUICIDAL IDEATION- Suicidal thoughts and actions     Antimicrobials this admission: 2/24 Cefepime >> 2/25 2/24 vancomycin >> 2/25 2/25 meropenem >>   Microbiology results: 2/24 MRSA PCR neg 2/25 Bcx 1/2 ESBL K pna 2/25 Ucx ordered  Thank you for allowing pharmacy to be a part of this patient's care.  3/25, PharmD, MBA Pharmacy Resident [phone] 05/03/2020 10:45 AM

## 2020-05-03 NOTE — Progress Notes (Signed)
NAME:  Phillip Klein, MRN:  [id number], DOB:  [birth year], LOS: 4 ADMISSION DATE:  05/06/20, CONSULTATION DATE:  2/27 REFERRING MD:  Silverio Lay, CHIEF COMPLAINT:  Sepsis   Brief History:  54yo male who presented from [facility] for evaluation of hypotension. PCCM consulted for further management of sepsis and hypotension.  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 Malnutrition Neurogenic astrostatic hypotension  HFrEF Untreated HCV Previous IVDU Cirrhosis  Chronic pain  Significant Hospital Events:  Admitted 2/24  Consults:  CCM ID  Procedures:  Trach present prior to admission Hickman catheter prior to admission > removed 2/26  Significant Diagnostic Tests:    Micro Data:  2/24 blood > [name] ESBL 1/4 bottle 2/26 urine > pending  Antimicrobials:  2/24 cefepime> 2/25 2/24 vanc > 2/25 2/25 mero >    Interim History / Subjective:  No acute events overnight Objective   Blood pressure 104/73, pulse 92, temperature 98.2 F (36.8 C), temperature source Oral, resp. rate (!) 22, height 6\' 2"  (1.88 m), weight 107 kg, SpO2 98 %.    Vent Mode: PRVC FiO2 (%):  [40 %] 40 % Set Rate:  [26 bmp] 26 bmp Vt Set:  [510 mL] 510 mL PEEP:  [5 cmH20-8 cmH20] 8 cmH20 Plateau Pressure:  [18 cmH20-20 cmH20] 18 cmH20   Intake/Output Summary (Last 24 hours) at 05/03/2020 05/05/2020 Last data filed at 05/03/2020 0600 Gross per 24 hour  Intake 1882.63 ml  Output 1795 ml  Net 87.63 ml   Filed Weights   05/01/20 0500 05/02/20 0500 05/03/20 0500  Weight: 104.8 kg 104.7 kg 107 kg    Examination:  General: Lying comfortably in bed, without HENT: NCAT tracheostomy in place PULM: CTA B, vent supported breathing CV: RRR, no mgr GI: BS+, PEG, wound dressing c/d/i MSK: normal bulk and tone Neuro: awake, alert, no  muscle tone or movement in arms/legs    Resolved Hospital Problem list     Assessment & Plan:  [name] bacteremia from UTI Continue meropenem Currently on a line holiday, plan to replace PICC versus Hickman on 2/28 and then he can discharge home.  AKI Creatinine slightly increased at 2.8 from 2.68 yesterday. Monitor BMET and UOP Replace electrolytes as needed  Jejunostomy tube drainage> improved Continue feeding through J tube  Septic shock> resolved but still has some hypotension likely related to dysautonomia Monitor hemodynamics Continue midodrine Continue florinef  Chronic respiratory failure with hypoxemia Continue full vent support  Multiple pressure ulcers/wounds present on admission Wound care  Right heat failure Minimize volume resuscitation  Chronic pain Continue fentanyl patch Restart home oxycodone  Depression, insomnia Sertraline, trazodone restart per home doses Clonazepam to continue  GERD  Pantoprazole   Best practice (evaluated daily)  Diet: tube feeding Pain/Anxiety/Delirium protocol (if indicated): n/a VAP protocol (if indicated): yes DVT prophylaxis: start sub q heparin GI prophylaxis: n/a Glucose control: SSI Mobility: bed rest Disposition:here until 3/1 then back to [facility]  Goals of Care:  Last date of multidisciplinary goals of care discussion:2/25 Family and staff present: wife, bedside nurse, patient, McQuaid Summary of discussion: full scope of practice, "keep me alive at all costs" Follow up goals of care discussion due: 3/3 Code Status: full  Labs   CBC: Recent Labs  Lab 05/06/2020 2200 04/30/20 0545 05/01/20 0535 05/02/20 1006 05/03/20 0141  WBC 15.5* 11.6* 8.9 6.2 6.4  NEUTROABS 13.8*  --   --  5.1 4.4  HGB 7.2* 7.8* 8.5* 7.7* 7.3*  HCT 22.4* 23.5* 26.0* 23.7* 22.1*  MCV 91.4 87.7 88.1 86.8 86.3  PLT 179 161 179 157 113*    Basic Metabolic Panel: Recent Labs  Lab 2020/05/08 1606 2020-05-08 1639  05-08-2020 1640 2020/05/08 2200 04/30/20 0545 05/01/20 0535 05/02/20 1006 05/03/20 0141  NA 132*   < > 137  --  135 134* 134* 135  K 2.5*   < > 2.9*  --  3.7 3.1* 3.2* 3.5  CL 110  --  105  --  101 100 101 102  CO2 13*  --   --   --  20* 19* 18* 19*  GLUCOSE 258*  --  180*  --  74 108* 94 96  BUN 42*  --  61*  --  62* 70* 81* 86*  CREATININE 1.79*  --  2.00*  --  2.69* 2.87* 2.78* 2.80*  CALCIUM 5.0*  --   --   --  8.0* 8.2* 8.1* 8.3*  MG 1.0*  --   --  1.8 2.0  --   --   --   PHOS  --   --   --  7.5* 7.9*  --   --   --    < > = values in this interval not displayed.   GFR: Estimated Creatinine Clearance: 40.2 mL/min (A) (by C-G formula based on SCr of 2.8 mg/dL (H)). Recent Labs  Lab 05/08/20 1606 05/08/2020 2200 2020/05/08 2200 04/30/20 0545 05/01/20 0535 05/02/20 1006 05/03/20 0141  PROCALCITON  --  34.80  --   --   --   --   --   WBC  --  15.5*   < > 11.6* 8.9 6.2 6.4  LATICACIDVEN 0.7  --   --   --   --   --   --    < > = values in this interval not displayed.    Liver Function Tests: Recent Labs  Lab May 08, 2020 1606 05/02/20 1006 05/03/20 0141  AST 20 14* 13*  ALT 9 9 8   ALKPHOS 85 97 100  BILITOT 0.6 0.5 0.9  PROT 3.9* 6.1* 6.2*  ALBUMIN <1.0* 1.7* 2.0*   No results for input(s): LIPASE, AMYLASE in the last 168 hours. No results for input(s): AMMONIA in the last 168 hours.  ABG    Component Value Date/Time   HCO3 16.5 (L) 05/08/2020 1639   TCO2 18 (L) 2020-05-08 1640   ACIDBASEDEF 7.0 (H) 05/08/2020 1639   O2SAT 100.0 08-May-2020 1639     Coagulation Profile: Recent Labs  Lab 05-08-2020 2200  INR 1.4*    Cardiac Enzymes: No results for input(s): CKTOTAL, CKMB, CKMBINDEX, TROPONINI in the last 168 hours.  HbA1C: Hgb A1c MFr Bld  Date/Time Value Ref Range Status  2020/05/08 10:00 PM 5.1 4.8 - 5.6 % Final    Comment:    (NOTE) Pre diabetes:          5.7%-6.4%  Diabetes:              >6.4%  Glycemic control for   <7.0% adults with diabetes      CBG: Recent Labs  Lab 05/02/20 1124 05/02/20 1545 05/02/20 2012 05/02/20 2344 05/03/20 0345  GLUCAP 92 92 96 100* 91     Critical care time: n/a     05/05/20, MD  PGY-2, [facility]  05/03/20  7:11 AM If no response,  please call [phone] until 7pm After 7:00 pm call Elink  [phone]

## 2020-05-04 ENCOUNTER — Inpatient Hospital Stay (HOSPITAL_COMMUNITY): Payer: Medicare Other

## 2020-05-04 DIAGNOSIS — L89154 Pressure ulcer of sacral region, stage 4: Secondary | ICD-10-CM | POA: Diagnosis not present

## 2020-05-04 DIAGNOSIS — Z515 Encounter for palliative care: Secondary | ICD-10-CM | POA: Diagnosis not present

## 2020-05-04 DIAGNOSIS — Z4659 Encounter for fitting and adjustment of other gastrointestinal appliance and device: Secondary | ICD-10-CM

## 2020-05-04 DIAGNOSIS — L89104 Pressure ulcer of unspecified part of back, stage 4: Secondary | ICD-10-CM | POA: Diagnosis not present

## 2020-05-04 DIAGNOSIS — L89214 Pressure ulcer of right hip, stage 4: Secondary | ICD-10-CM | POA: Diagnosis not present

## 2020-05-04 DIAGNOSIS — G903 Multi-system degeneration of the autonomic nervous system: Secondary | ICD-10-CM | POA: Diagnosis not present

## 2020-05-04 DIAGNOSIS — I959 Hypotension, unspecified: Secondary | ICD-10-CM | POA: Diagnosis present

## 2020-05-04 DIAGNOSIS — F1120 Opioid dependence, uncomplicated: Secondary | ICD-10-CM | POA: Diagnosis not present

## 2020-05-04 DIAGNOSIS — Z20822 Contact with and (suspected) exposure to covid-19: Secondary | ICD-10-CM | POA: Diagnosis not present

## 2020-05-04 DIAGNOSIS — B182 Chronic viral hepatitis C: Secondary | ICD-10-CM | POA: Diagnosis not present

## 2020-05-04 DIAGNOSIS — J9621 Acute and chronic respiratory failure with hypoxia: Secondary | ICD-10-CM | POA: Diagnosis not present

## 2020-05-04 DIAGNOSIS — R6521 Severe sepsis with septic shock: Secondary | ICD-10-CM | POA: Diagnosis not present

## 2020-05-04 DIAGNOSIS — G8250 Quadriplegia, unspecified: Secondary | ICD-10-CM | POA: Diagnosis not present

## 2020-05-04 DIAGNOSIS — I82612 Acute embolism and thrombosis of superficial veins of left upper extremity: Secondary | ICD-10-CM | POA: Diagnosis not present

## 2020-05-04 DIAGNOSIS — I5043 Acute on chronic combined systolic (congestive) and diastolic (congestive) heart failure: Secondary | ICD-10-CM | POA: Diagnosis not present

## 2020-05-04 DIAGNOSIS — D62 Acute posthemorrhagic anemia: Secondary | ICD-10-CM | POA: Diagnosis not present

## 2020-05-04 DIAGNOSIS — J15212 Pneumonia due to Methicillin resistant Staphylococcus aureus: Secondary | ICD-10-CM | POA: Diagnosis not present

## 2020-05-04 DIAGNOSIS — E43 Unspecified severe protein-calorie malnutrition: Secondary | ICD-10-CM | POA: Diagnosis not present

## 2020-05-04 DIAGNOSIS — Z66 Do not resuscitate: Secondary | ICD-10-CM | POA: Diagnosis not present

## 2020-05-04 DIAGNOSIS — A4159 Other Gram-negative sepsis: Secondary | ICD-10-CM | POA: Diagnosis not present

## 2020-05-04 DIAGNOSIS — N170 Acute kidney failure with tubular necrosis: Secondary | ICD-10-CM | POA: Diagnosis not present

## 2020-05-04 DIAGNOSIS — L89894 Pressure ulcer of other site, stage 4: Secondary | ICD-10-CM | POA: Diagnosis not present

## 2020-05-04 DIAGNOSIS — R7881 Bacteremia: Secondary | ICD-10-CM | POA: Diagnosis not present

## 2020-05-04 DIAGNOSIS — E872 Acidosis: Secondary | ICD-10-CM | POA: Diagnosis not present

## 2020-05-04 DIAGNOSIS — Z9911 Dependence on respirator [ventilator] status: Secondary | ICD-10-CM | POA: Diagnosis not present

## 2020-05-04 DIAGNOSIS — I130 Hypertensive heart and chronic kidney disease with heart failure and stage 1 through stage 4 chronic kidney disease, or unspecified chronic kidney disease: Secondary | ICD-10-CM | POA: Diagnosis not present

## 2020-05-04 DIAGNOSIS — J9622 Acute and chronic respiratory failure with hypercapnia: Secondary | ICD-10-CM | POA: Diagnosis not present

## 2020-05-04 DIAGNOSIS — L89023 Pressure ulcer of left elbow, stage 3: Secondary | ICD-10-CM | POA: Diagnosis not present

## 2020-05-04 LAB — URINALYSIS, ROUTINE W REFLEX MICROSCOPIC
Bilirubin Urine: NEGATIVE
Glucose, UA: NEGATIVE mg/dL
Ketones, ur: NEGATIVE mg/dL
Nitrite: NEGATIVE
Protein, ur: 100 mg/dL — AB
RBC / HPF: >50 RBC/hpf — ABNORMAL HIGH (ref 0–5)
Specific Gravity, Urine: 1.018 (ref 1.005–1.030)
WBC, UA: >50 WBC/hpf — ABNORMAL HIGH (ref 0–5)
pH: 5 (ref 5.0–8.0)

## 2020-05-04 LAB — BASIC METABOLIC PANEL
Anion gap: 15 (ref 5–15)
BUN: 99 mg/dL — ABNORMAL HIGH (ref 6–20)
CO2: 18 mmol/L — ABNORMAL LOW (ref 22–32)
Calcium: 8.3 mg/dL — ABNORMAL LOW (ref 8.9–10.3)
Chloride: 102 mmol/L (ref 98–111)
Creatinine, Ser: 2.91 mg/dL — ABNORMAL HIGH (ref 0.61–1.24)
GFR, Estimated: 25 mL/min — ABNORMAL LOW (ref >60)
Glucose, Bld: 107 mg/dL — ABNORMAL HIGH (ref 70–99)
Potassium: 3.2 mmol/L — ABNORMAL LOW (ref 3.5–5.1)
Sodium: 135 mmol/L (ref 135–145)

## 2020-05-04 LAB — GLUCOSE, CAPILLARY
Glucose-Capillary: 105 mg/dL — ABNORMAL HIGH (ref 70–99)
Glucose-Capillary: 108 mg/dL — ABNORMAL HIGH (ref 70–99)
Glucose-Capillary: 111 mg/dL — ABNORMAL HIGH (ref 70–99)
Glucose-Capillary: 111 mg/dL — ABNORMAL HIGH (ref 70–99)
Glucose-Capillary: 112 mg/dL — ABNORMAL HIGH (ref 70–99)
Glucose-Capillary: 117 mg/dL — ABNORMAL HIGH (ref 70–99)

## 2020-05-04 LAB — HCV RNA QUANT
HCV Quantitative Log: 2.204 log10 IU/mL (ref >1.70)
HCV Quantitative: 160 IU/mL (ref >50)

## 2020-05-04 LAB — CREATININE, URINE, RANDOM: Creatinine, Urine: 67.29 mg/dL

## 2020-05-04 LAB — CK: Total CK: 8 U/L — ABNORMAL LOW (ref 49–397)

## 2020-05-04 LAB — SODIUM, URINE, RANDOM: Sodium, Ur: <10 mmol/L

## 2020-05-04 MED ORDER — SODIUM CHLORIDE 0.9% FLUSH: 10.0000 mL | INTRAVENOUS | Status: DC | PRN

## 2020-05-04 MED ORDER — SODIUM CHLORIDE 0.9% FLUSH
10.0000 mL | Freq: Two times a day (BID) | INTRAVENOUS | Status: DC
  Administered 2020-05-04 – 2020-05-13 (×18): 10 mL

## 2020-05-04 MED ORDER — ALBUMIN HUMAN 25 % IV SOLN
50.0000 g | Freq: Every day | INTRAVENOUS | Status: DC
  Administered 2020-05-04 – 2020-05-07 (×4): 50 g via INTRAVENOUS
  Filled 2020-05-04 (×4): qty 200

## 2020-05-04 MED ORDER — POTASSIUM CHLORIDE 20 MEQ PO PACK
40.0000 meq | PACK | Freq: Once | ORAL | Status: AC
  Administered 2020-05-04: 40 meq
  Filled 2020-05-04: qty 2

## 2020-05-04 MED ORDER — FUROSEMIDE 10 MG/ML IJ SOLN: 40.0000 mg | Freq: Once | INTRAMUSCULAR | Status: DC

## 2020-05-04 NOTE — TOC Initial Note (Signed)
Transition of Care [facility]) - Initial/Assessment Note    Patient Details  Name: Phillip Klein MRN: [id number] Date of Birth: 1965/10/21  Transition of Care [facility]) CM/SW Contact:    Epifanio Lesches, RN Phone Number: [phone] 05/04/2020, 1:50 PM  Clinical Narrative:   Presented with hypotension. Hx of chronic vent dependence via trach, quadriplegia (after spinal surgery)..prolonged hospitalization @ [facility]. From [facility]. Supportive wife, Inetta Fermo [phone]).  Per [facility] /  Prem [phone] ), wife didn't hold bed for pt to return to [facility]. Currently [facility] without SNF bed availability. States pt unstable and not appropriate for SNF level of care. States pt has used Medicare days from 1/20-2/24. States by April 29th pt will have exhausted all 100 Medicare days.  TOC team to work pt up for SNF/ vent placement and assist with with TOC needs  Expected Discharge Plan: Skilled Nursing Facility Barriers to Discharge: Continued Medical Work up   Patient Goals and CMS Choice        Expected Discharge Plan and Services Expected Discharge Plan: Skilled Nursing Facility                                              Prior Living Arrangements/Services                       Activities of Daily Living      Permission Sought/Granted                  Emotional Assessment              Admission diagnosis:  Pleural effusion [J90] Septic shock (HCC) [A41.9, R65.21] Hypotension, unspecified hypotension type [I95.9] Anemia, unspecified type [D64.9] Patient Active Problem List   Diagnosis Date Noted  . Anemia   . Jejunostomy tube leak (HCC)   . Pleural effusion   . Quadriplegia (HCC) 05/01/2020  . Gram-negative bacteremia 05/01/2020  . Infection due to ESBL-producing [name] pneumoniae 05/01/2020  . Chronic hepatitis C without hepatic coma (HCC) 05/01/2020  . Encounter for feeding tube placement   . Hypotension   . Pressure injury  of skin 04/30/2020  . Malnutrition of moderate degree 04/30/2020  . Septic shock (HCC) 05-14-2020   PCP:  Corine Shelter, MD Pharmacy:  No Pharmacies Listed    Social Determinants of Health (SDOH) Interventions    Readmission Risk Interventions No flowsheet data found.

## 2020-05-04 NOTE — Progress Notes (Signed)
Nutrition Follow-up  DOCUMENTATION CODES:   Non-severe (moderate) malnutrition in context of chronic illness  INTERVENTION:   Continue tube feeds via J-port: - Osmolite 1.5 @ 60 ml/hr (1440 ml/day) - ProSource TF 90 ml TID  Provides 2400 kcal, 156 grams of protein, and 1097 ml of H2O.  - Continue Juven 1 packet BID per tube, each packet provides 95 calories, 2.5 grams of protein, and 9.8 grams of carbohydrate; also contains 7 grams of L-arginine and L-glutamine, 300 mg vitamin C, 15 mg vitamin E, 1.2 mcg vitamin B-12, 9.5 mg zinc, 200 mg calcium, and 1.5 g calcium Beta-hydroxy-Beta-methylbutyrate to support wound healing  - Continue MVI with minerals daily per tube  NUTRITION DIAGNOSIS:   Moderate Malnutrition related to chronic illness (CHF, cirrhosis) as evidenced by moderate fat depletion,severe muscle depletion.  Ongoing   GOAL:   Patient will meet greater than or equal to 90% of their needs   Met   MONITOR:   Vent status,Labs,Weight trends,TF tolerance,Skin,I & O's  REASON FOR ASSESSMENT:   Ventilator,Consult Enteral/tube feeding initiation and management  ASSESSMENT:   55 year old male who presented to the ED on 2/24 from [facility] with hypotension, fever. Pt was scheduled to have revision of J-tube on 2/24 and was found to be hypotensive. PMH of vent dependence via trach, quadriplegia after cervical spine surgery in summer of 2021, s/p ex-lap for G-tube malfunction, multiple pressure injuries, GERD, T2DM, anemia, CHF, previous IVDA, cirrhosis. Pt admitted with septic shock.   Discussed patient in ICU rounds and with RN today. Unable to transfer back to [facility] at this time.   Patient has a PEG-J tube (tip of J port is likely in the descending duodenum per abdominal film on 2/25). Currently receiving Osmolite 1.5 at 60 ml/h with Prosource TF 90 ml TID, Juven BID, and MVI daily. Tolerating without difficulty.   Patient is on chronic ventilator support via  trach. MV: 12.7 L/min Temp (24hrs), Avg:98 F (36.7 C), Min:97.5 F (36.4 C), Max:98.3 F (36.8 C)  Medications reviewed and include: Novolog, MVI with minerals, Juven, Protonix. Received Klor-Con for K repletion today.   Labs reviewed: K 3.2, BUN 99, creatinine 2.91 CBG's: 111-112-111-105  UOP: 315 ml x 24 hours Stool output: 1350 ml x 24 hours I/O's: +1.6 L since admit   Diet Order:   Diet Order            Diet NPO time specified  Diet effective now                 EDUCATION NEEDS:   No education needs have been identified at this time  Skin:  Skin Assessment: Skin Integrity Issues: DTI: right leg Stage I: right leg Stage III: left elbow Stage IV: vertebral column, sacrum, bilateral ischial tuberosity, right hip Unstageable: left leg x 2, right leg x 3, right elbow, left arm Other: skin tear right arm  Last BM:  3/1 colostomy  Height:   Ht Readings from Last 1 Encounters:  04/27/2020 '6\' 2"'  (1.88 m)    Weight:   Wt Readings from Last 1 Encounters:  05/04/20 107.8 kg    BMI:  Body mass index is 30.51 kg/m.  Estimated Nutritional Needs:   Kcal:  2400-2600  Protein:  150-170 grams  Fluid:  >/= 2.0 L   Lucas Mallow, RD, LDN, CNSC Please refer to Amion for contact information.

## 2020-05-04 NOTE — Progress Notes (Signed)
NAME:  Phillip Klein, MRN:  [id number], DOB:  1965-05-11, LOS: 5 ADMISSION DATE:  30-Apr-2020, CONSULTATION DATE:  2/27 REFERRING MD:  Silverio Lay, CHIEF COMPLAINT:  Sepsis   Brief History:  55yo male who presented from [facility] for evaluation of hypotension. PCCM consulted for further management of sepsis and hypotension.  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 Malnutrition Neurogenic astrostatic hypotension  HFrEF Untreated HCV Previous IVDU Cirrhosis  Chronic pain  Significant Hospital Events:  Admitted 2/24  Consults:  CCM ID  Procedures:  Trach present prior to admission Hickman catheter prior to admission > removed 2/26  Significant Diagnostic Tests:    Micro Data:  2/24 blood > [name] ESBL 1/4 bottle 2/26 urine > pending  Antimicrobials:  2/24 cefepime> 2/25 2/24 vanc > 2/25 2/25 mero >    Interim History / Subjective:  No acute events overnight Labs still pending this morning.   Objective   Blood pressure 112/70, pulse 80, temperature 98.1 F (36.7 C), temperature source Oral, resp. rate (!) 26, height 6\' 2"  (1.88 m), weight 107.8 kg, SpO2 99 %.    Vent Mode: PRVC FiO2 (%):  [40 %] 40 % Set Rate:  [26 bmp] 26 bmp Vt Set:  [510 mL] 510 mL PEEP:  [8 cmH20] 8 cmH20 Plateau Pressure:  [18 cmH20-22 cmH20] 22 cmH20   Intake/Output Summary (Last 24 hours) at 05/04/2020 07/04/2020 Last data filed at 05/04/2020 0700 Gross per 24 hour  Intake 1611.95 ml  Output 1650 ml  Net -38.05 ml   Filed Weights   05/02/20 0500 05/03/20 0500 05/04/20 0436  Weight: 104.7 kg 107 kg 107.8 kg    Examination:  General: elderly appearing male in NAD HENT: [place]/AT, Trach in place PULM: Clear bilateral breath sounds. Vent assisted breaths.  CV: RRR, no MRG. +3 bilateral lower extremity edema. GI: PEG,  normoactive BS MSK: No acute deformity, normal bulk and tone.  Neuro: awake, alert, oriented. No movement of arms or legs.    Resolved Hospital Problem list     Assessment & Plan:   [name] bacteremia from UTI - Continue meropenem for 10 day course starting 2/28 > 3/9. Appreciate ID assistance. - Will need long term IV acces prior to discharge. Prefer non-tunneled line, perhaps even midline. Will discuss with IV nurse.   AKI - Awaiting AM labs - Replace electrolytes as needed - Considering diuresis  Jejunostomy tube drainage> improved - Continue TF through J tube  Septic shock> resolved, but still has some hypotension likely related to dysautonomia - Monitor hemodynamics - Continue midodrine - Continue florinef  Acute on chronic HFrEF (LVEF 45-50% with grade 2 diastolic dysfunction) - home amlodipine, lasix had been on hold due to hypotension - may need to restart diuresis today pending renal failure  Chronic respiratory failure with hypoxemia - Continue full vent support  Multiple pressure ulcers/wounds present on admission - Wound care  Chronic pain - Continue fentanyl patch - Restart home oxycodone  Depression, insomnia - Sertraline, trazodone at home dose - Continue clonazepam  GERD  - Pantoprazole   Hypothyroid - continue home synthroid  Best practice (evaluated daily)  Diet: tube feeding Pain/Anxiety/Delirium protocol (if indicated): n/a VAP protocol (if indicated): yes DVT prophylaxis: SQH GI prophylaxis: n/a Glucose control: SSI Mobility: bed rest Disposition: pending transfer back to [facility].   Goals of Care:  Last  date of multidisciplinary goals of care discussion:2/25 Family and staff present: wife, bedside nurse, patient, [name] Summary of discussion: full scope of practice, "keep me alive at all costs" Follow up goals of care discussion due: 3/3 Code Status: full  Labs   CBC: Recent Labs  Lab 04/10/2020 2200 04/30/20 0545  05/01/20 0535 05/02/20 1006 05/03/20 0141  WBC 15.5* 11.6* 8.9 6.2 6.4  NEUTROABS 13.8*  --   --  5.1 4.4  HGB 7.2* 7.8* 8.5* 7.7* 7.3*  HCT 22.4* 23.5* 26.0* 23.7* 22.1*  MCV 91.4 87.7 88.1 86.8 86.3  PLT 179 161 179 157 113*    Basic Metabolic Panel: Recent Labs  Lab 05/02/2020 1606 04/15/2020 1639 05/02/2020 1640 04/28/2020 2200 04/30/20 0545 05/01/20 0535 05/02/20 1006 05/03/20 0141  NA 132*   < > 137  --  135 134* 134* 135  K 2.5*   < > 2.9*  --  3.7 3.1* 3.2* 3.5  CL 110  --  105  --  101 100 101 102  CO2 13*  --   --   --  20* 19* 18* 19*  GLUCOSE 258*  --  180*  --  74 108* 94 96  BUN 42*  --  61*  --  62* 70* 81* 86*  CREATININE 1.79*  --  2.00*  --  2.69* 2.87* 2.78* 2.80*  CALCIUM 5.0*  --   --   --  8.0* 8.2* 8.1* 8.3*  MG 1.0*  --   --  1.8 2.0  --   --   --   PHOS  --   --   --  7.5* 7.9*  --   --   --    < > = values in this interval not displayed.   GFR: Estimated Creatinine Clearance: 40.3 mL/min (A) (by C-G formula based on SCr of 2.8 mg/dL (H)). Recent Labs  Lab 04/27/2020 1606 04/07/2020 2200 04/20/2020 2200 04/30/20 0545 05/01/20 0535 05/02/20 1006 05/03/20 0141  PROCALCITON  --  34.80  --   --   --   --   --   WBC  --  15.5*   < > 11.6* 8.9 6.2 6.4  LATICACIDVEN 0.7  --   --   --   --   --   --    < > = values in this interval not displayed.    Liver Function Tests: Recent Labs  Lab 04/14/2020 1606 05/02/20 1006 05/03/20 0141  AST 20 14* 13*  ALT 9 9 8   ALKPHOS 85 97 100  BILITOT 0.6 0.5 0.9  PROT 3.9* 6.1* 6.2*  ALBUMIN <1.0* 1.7* 2.0*   No results for input(s): LIPASE, AMYLASE in the last 168 hours. No results for input(s): AMMONIA in the last 168 hours.  ABG    Component Value Date/Time   HCO3 16.5 (L) 04/19/2020 1639   TCO2 18 (L) 04/11/2020 1640   ACIDBASEDEF 7.0 (H) 04/10/2020 1639   O2SAT 100.0 05/01/2020 1639     Coagulation Profile: Recent Labs  Lab 04/06/2020 2200  INR 1.4*    Cardiac Enzymes: No results for input(s):  CKTOTAL, CKMB, CKMBINDEX, TROPONINI in the last 168 hours.  HbA1C: Hgb A1c MFr Bld  Date/Time Value Ref Range Status  04/08/2020 10:00 PM 5.1 4.8 - 5.6 % Final    Comment:    (NOTE) Pre diabetes:          5.7%-6.4%  Diabetes:              >  6.4%  Glycemic control for   <7.0% adults with diabetes     CBG: Recent Labs  Lab 05/03/20 1550 05/03/20 1934 05/03/20 2326 05/04/20 0324 05/04/20 0749  GLUCAP 105* 90 105* 111* 112*     Critical care time: n/a      Joneen Roach, AGACNP-BC [facility]  See Amion for personal pager PCCM on call pager [phone] until 7pm. Please call Elink 7p-7a. [phone]  05/04/2020 8:42 AM

## 2020-05-04 DEATH — deceased

## 2020-05-05 DIAGNOSIS — R7881 Bacteremia: Secondary | ICD-10-CM | POA: Diagnosis not present

## 2020-05-05 DIAGNOSIS — Z4659 Encounter for fitting and adjustment of other gastrointestinal appliance and device: Secondary | ICD-10-CM | POA: Diagnosis not present

## 2020-05-05 DIAGNOSIS — A4159 Other Gram-negative sepsis: Secondary | ICD-10-CM | POA: Diagnosis not present

## 2020-05-05 DIAGNOSIS — I959 Hypotension, unspecified: Secondary | ICD-10-CM | POA: Diagnosis not present

## 2020-05-05 DIAGNOSIS — B182 Chronic viral hepatitis C: Secondary | ICD-10-CM | POA: Diagnosis not present

## 2020-05-05 LAB — BASIC METABOLIC PANEL
Anion gap: 16 — ABNORMAL HIGH (ref 5–15)
BUN: 108 mg/dL — ABNORMAL HIGH (ref 6–20)
CO2: 17 mmol/L — ABNORMAL LOW (ref 22–32)
Calcium: 8.5 mg/dL — ABNORMAL LOW (ref 8.9–10.3)
Chloride: 106 mmol/L (ref 98–111)
Creatinine, Ser: 2.77 mg/dL — ABNORMAL HIGH (ref 0.61–1.24)
GFR, Estimated: 27 mL/min — ABNORMAL LOW (ref >60)
Glucose, Bld: 132 mg/dL — ABNORMAL HIGH (ref 70–99)
Potassium: 3.6 mmol/L (ref 3.5–5.1)
Sodium: 139 mmol/L (ref 135–145)

## 2020-05-05 LAB — GLUCOSE, CAPILLARY
Glucose-Capillary: 102 mg/dL — ABNORMAL HIGH (ref 70–99)
Glucose-Capillary: 108 mg/dL — ABNORMAL HIGH (ref 70–99)
Glucose-Capillary: 111 mg/dL — ABNORMAL HIGH (ref 70–99)
Glucose-Capillary: 112 mg/dL — ABNORMAL HIGH (ref 70–99)
Glucose-Capillary: 123 mg/dL — ABNORMAL HIGH (ref 70–99)
Glucose-Capillary: 126 mg/dL — ABNORMAL HIGH (ref 70–99)

## 2020-05-05 LAB — CBC
HCT: 23 % — ABNORMAL LOW (ref 39.0–52.0)
Hemoglobin: 7.6 g/dL — ABNORMAL LOW (ref 13.0–17.0)
MCH: 29.3 pg (ref 26.0–34.0)
MCHC: 33 g/dL (ref 30.0–36.0)
MCV: 88.8 fL (ref 80.0–100.0)
Platelets: 144 10*3/uL — ABNORMAL LOW (ref 150–400)
RBC: 2.59 MIL/uL — ABNORMAL LOW (ref 4.22–5.81)
RDW: 16.1 % — ABNORMAL HIGH (ref 11.5–15.5)
WBC: 7.5 10*3/uL (ref 4.0–10.5)
nRBC: 0 % (ref 0.0–0.2)

## 2020-05-05 LAB — CULTURE, BLOOD (ROUTINE X 2)
Culture: NO GROWTH
Special Requests: ADEQUATE

## 2020-05-05 LAB — MAGNESIUM: Magnesium: 1.9 mg/dL (ref 1.7–2.4)

## 2020-05-05 MED ORDER — MIDODRINE HCL 5 MG PO TABS: 5.0000 mg | ORAL_TABLET | Freq: Three times a day (TID) | ORAL | Status: DC

## 2020-05-05 MED ORDER — STERILE WATER FOR INJECTION IV SOLN
INTRAVENOUS | Status: DC
  Filled 2020-05-05 (×5): qty 850

## 2020-05-05 MED ORDER — MIDODRINE HCL 5 MG PO TABS
2.5000 mg | ORAL_TABLET | Freq: Three times a day (TID) | ORAL | Status: DC
  Administered 2020-05-05 – 2020-05-06 (×3): 2.5 mg
  Filled 2020-05-05 (×3): qty 1

## 2020-05-05 MED ORDER — POTASSIUM CHLORIDE 20 MEQ PO PACK
40.0000 meq | PACK | Freq: Once | ORAL | Status: AC
  Administered 2020-05-05: 40 meq
  Filled 2020-05-05: qty 2

## 2020-05-05 NOTE — Progress Notes (Signed)
Attending:   Seen and examined with Joneen Roach.  Subjective: BUN up today No complaints otherwise  Objective: Vitals:   05/05/20 0800 05/05/20 0815 05/05/20 0816 05/05/20 0900  BP:    116/79  Pulse:    79  Resp: (!) 26   (!) 26  Temp:   (!) 97.3 F (36.3 C)   TempSrc:   Oral   SpO2:  98%  99%  Weight:      Height:       Vent Mode: PRVC FiO2 (%):  [40 %] 40 % Set Rate:  [26 bmp] 26 bmp Vt Set:  [510 mL] 510 mL PEEP:  [8 cmH20] 8 cmH20 Plateau Pressure:  [20 cmH20-23 cmH20] 22 cmH20  Intake/Output Summary (Last 24 hours) at 05/05/2020 0948 Last data filed at 05/05/2020 0943 Gross per 24 hour  Intake 1766.06 ml  Output 1765 ml  Net 1.06 ml    General:  In bed on vent HENT: NCAT tracheostomy in place PULM: CTA B, vent supported breathing CV: RRR, no mgr GI: BS+, soft, nontender MSK: no bulk and tone Neuro: awake, alert    CBC    Component Value Date/Time   WBC 7.5 05/05/2020 0436   RBC 2.59 (L) 05/05/2020 0436   HGB 7.6 (L) 05/05/2020 0436   HCT 23.0 (L) 05/05/2020 0436   PLT 144 (L) 05/05/2020 0436   MCV 88.8 05/05/2020 0436   MCH 29.3 05/05/2020 0436   MCHC 33.0 05/05/2020 0436   RDW 16.1 (H) 05/05/2020 0436   LYMPHSABS 1.2 05/03/2020 0141   MONOABS 0.4 05/03/2020 0141   EOSABS 0.4 05/03/2020 0141   BASOSABS 0.0 05/03/2020 0141    BMET    Component Value Date/Time   NA 139 05/05/2020 0210   K 3.6 05/05/2020 0210   CL 106 05/05/2020 0210   CO2 17 (L) 05/05/2020 0210   GLUCOSE 132 (H) 05/05/2020 0210   BUN 108 (H) 05/05/2020 0210   CREATININE 2.77 (H) 05/05/2020 0210   CALCIUM 8.5 (L) 05/05/2020 0210   GFRNONAA 27 (L) 05/05/2020 0210      Impression/Plan: Critically ill due to worsening multi-organ failure  AKI> worsening, start low dose bicarbonate infusion, continue albumin, consult renal today Chronic hypotension > continue florinef, continue midodrine  Chronic respiratory failure with hypoxemia> continue full vent support ESBL  bacteremia> continue meropenem through midline   My cc time 31 minutes  Heber [place], MD [place] [facility] Pager: [phone] Cell: [phone] After 3pm or if no response, call [phone]

## 2020-05-05 NOTE — Progress Notes (Signed)
NAME:  Phillip Klein, MRN:  [id number], DOB:  1965/08/25, LOS: 6 ADMISSION DATE:  05/29/2020, CONSULTATION DATE:  2/27 REFERRING MD:  Silverio Lay, CHIEF COMPLAINT:  Sepsis   Brief History:  54yo male who presented from [facility] for evaluation of hypotension. PCCM consulted for further management of sepsis and hypotension.  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 Malnutrition Neurogenic astrostatic hypotension  HFrEF Untreated HCV Previous IVDU Cirrhosis  Chronic pain  Significant Hospital Events:  Admitted 2/24  Consults:  CCM ID  Procedures:  Trach present prior to admission Hickman catheter prior to admission > removed 2/26  Significant Diagnostic Tests:    Micro Data:  2/24 blood > [name] ESBL 1/4 bottle 2/26 urine > pending  Antimicrobials:  2/24 cefepime> 2/25 2/24 vanc > 2/25 2/25 meropenem >    Interim History / Subjective:  No acute event overnight. Urine output still low  Objective   Blood pressure 122/76, pulse 79, temperature 97.6 F (36.4 C), temperature source Oral, resp. rate (!) 26, height 6\' 2"  (1.88 m), weight 105.1 kg, SpO2 91 %.    Vent Mode: PRVC FiO2 (%):  [40 %] 40 % Set Rate:  [26 bmp] 26 bmp Vt Set:  [510 mL] 510 mL PEEP:  [8 cmH20] 8 cmH20 Plateau Pressure:  [20 cmH20-23 cmH20] 22 cmH20   Intake/Output Summary (Last 24 hours) at 05/05/2020 0748 Last data filed at 05/05/2020 0600 Gross per 24 hour  Intake 1756.06 ml  Output 1755 ml  Net 1.06 ml   Filed Weights   05/03/20 0500 05/04/20 0436 05/05/20 0500  Weight: 107 kg 107.8 kg 105.1 kg    Examination:  General: elderly appearing male in NAD HENT: [place]/AT, trach in place PULM: Clear bilateral breath sounds CV: RRR, no MRG. +3 bilateral lower extremity edema. GI: PEG, normoactive. Multiple areas of skin  breakdown. PEG site oozing.  MSK: No acute deformity. Diffuse anasarca Neuro: awake alert oriented, no movement of legs or arms.    Resolved Hospital Problem list     Assessment & Plan:   [name] bacteremia from UTI - Continue meropenem for 10 day course starting 2/28 > 3/9. Appreciate ID assistance. - Midline placed overnight for long term IV antibiotics  - Will need surveillance cultures done after antibiotics have completed considering his leadless pacemaker.    AKI: pre-renal by FeNa, however, he is overtly volume overloaded on exam.  NAG acidosis - Worse today, BUN now over 100.  - Nephrology consulted - Replace electrolytes as needed - Ideally needs diuresis, but BP has been borderline. A bit better today. May need CRRT   Jejunostomy tube drainage> improved - Continue TF through J tube  Septic shock> resolved, but still has some hypotension likely related to dysautonomia - Monitor hemodynamics - Continue midodrine, wean as tolerated - Continue florinef home med  Acute on chronic HFrEF (LVEF 45-50% with grade 2 diastolic dysfunction) - home amlodipine, lasix had been on hold due to hypotension - may need to restart diuresis today pending renal failure  Chronic respiratory failure with hypoxemia - Continue full vent support  Multiple pressure ulcers/wounds present on admission - Wound care  Chronic pain - Continue fentanyl patch - Home oxycodone  Depression, insomnia - Sertraline, trazodone at home dose - Continue clonazepam  GERD  - Pantoprazole   Hypothyroid - continue home synthroid  Best  practice (evaluated daily)  Diet: tube feeding Pain/Anxiety/Delirium protocol (if indicated): n/a VAP protocol (if indicated): yes DVT prophylaxis: SQH GI prophylaxis: n/a Glucose control: SSI Mobility: bed rest Disposition: pending transfer back to [facility].   Goals of Care:  Last date of multidisciplinary goals of care discussion:2/25 Family and staff  present: wife, bedside nurse, patient, [name] Summary of discussion: full scope of practice, "keep me alive at all costs" Follow up goals of care discussion due: 3/3 Code Status: full  Labs   CBC: Recent Labs  Lab 05/12/2020 2200 04/30/20 0545 05/01/20 0535 05/02/20 1006 05/03/20 0141 05/05/20 0436  WBC 15.5* 11.6* 8.9 6.2 6.4 7.5  NEUTROABS 13.8*  --   --  5.1 4.4  --   HGB 7.2* 7.8* 8.5* 7.7* 7.3* 7.6*  HCT 22.4* 23.5* 26.0* 23.7* 22.1* 23.0*  MCV 91.4 87.7 88.1 86.8 86.3 88.8  PLT 179 161 179 157 113* 144*    Basic Metabolic Panel: Recent Labs  Lab 05/12/20 1606 12-May-2020 1639 May 12, 2020 2200 04/30/20 0545 05/01/20 0535 05/02/20 1006 05/03/20 0141 05/04/20 0910 05/05/20 0210  NA 132*   < >  --  135 134* 134* 135 135 139  K 2.5*   < >  --  3.7 3.1* 3.2* 3.5 3.2* 3.6  CL 110   < >  --  101 100 101 102 102 106  CO2 13*  --   --  20* 19* 18* 19* 18* 17*  GLUCOSE 258*   < >  --  74 108* 94 96 107* 132*  BUN 42*   < >  --  62* 70* 81* 86* 99* 108*  CREATININE 1.79*   < >  --  2.69* 2.87* 2.78* 2.80* 2.91* 2.77*  CALCIUM 5.0*  --   --  8.0* 8.2* 8.1* 8.3* 8.3* 8.5*  MG 1.0*  --  1.8 2.0  --   --   --   --  1.9  PHOS  --   --  7.5* 7.9*  --   --   --   --   --    < > = values in this interval not displayed.   GFR: Estimated Creatinine Clearance: 40.3 mL/min (A) (by C-G formula based on SCr of 2.77 mg/dL (H)). Recent Labs  Lab 2020/05/12 1606 May 12, 2020 2200 04/30/20 0545 05/01/20 0535 05/02/20 1006 05/03/20 0141 05/05/20 0436  PROCALCITON  --  34.80  --   --   --   --   --   WBC  --  15.5*   < > 8.9 6.2 6.4 7.5  LATICACIDVEN 0.7  --   --   --   --   --   --    < > = values in this interval not displayed.    Liver Function Tests: Recent Labs  Lab May 12, 2020 1606 05/02/20 1006 05/03/20 0141  AST 20 14* 13*  ALT 9 9 8   ALKPHOS 85 97 100  BILITOT 0.6 0.5 0.9  PROT 3.9* 6.1* 6.2*  ALBUMIN <1.0* 1.7* 2.0*   No results for input(s): LIPASE, AMYLASE in the last  168 hours. No results for input(s): AMMONIA in the last 168 hours.  ABG    Component Value Date/Time   HCO3 16.5 (L) 2020-05-12 1639   TCO2 18 (L) 2020/05/12 1640   ACIDBASEDEF 7.0 (H) 2020-05-12 1639   O2SAT 100.0 05/12/20 1639     Coagulation Profile: Recent Labs  Lab 2020-05-12 2200  INR 1.4*    Cardiac Enzymes: Recent Labs  Lab 05/04/20 1701  CKTOTAL 8*    HbA1C: Hgb A1c MFr Bld  Date/Time Value Ref Range Status  2020-05-24 10:00 PM 5.1 4.8 - 5.6 % Final    Comment:    (NOTE) Pre diabetes:          5.7%-6.4%  Diabetes:              >6.4%  Glycemic control for   <7.0% adults with diabetes     CBG: Recent Labs  Lab 05/04/20 1118 05/04/20 1540 05/04/20 1956 05/04/20 2340 05/05/20 0337  GLUCAP 111* 105* 108* 117* 126*     Critical care time: n/a      Joneen Roach, AGACNP-BC [facility]  See Amion for personal pager PCCM on call pager [phone] until 7pm. Please call Elink 7p-7a. [phone]  05/05/2020 7:48 AM

## 2020-05-05 NOTE — Consult Note (Signed)
Reason for Consult: AKI/CKD stage III Referring Physician: Lake Bells, MD  Phillip Klein is an 55 y.o. male has a PMH significant for DM type 2, GERD, neurogenic astrostatic hypotension, diastolic CHF, and chronic vent dependence via trach after spinal surgery complicated by quadriplegia (at [facility] in 2021 for epidural abscess) who was sent via [place] from [place] after he was noted to be hypotensive.  In the ED he was febrile at 103, tachypnea at 26, hypotensive and appeared toxic.  Labs notable for K 2.5, Na 132, gluc 258, Cr 1.79, Ca 5, Mg 1, albumin <1 and was admitted to the ICU sepsis protocol for [name] bacteremia.  He was started on broad spectrum abx and pressors but changed to meropenem on 05/03/20.  We were consulted due to the development of oliguric AKI (unknown baseline Scr but elevated on admission at 1.79).  The trend is seen below.  His BP has remained low but has not required resumption of levophed.  Trend in Creatinine: Creatinine, Ser  Date/Time Value Ref Range Status  05/05/2020 02:10 AM 2.77 (H) 0.61 - 1.24 mg/dL Final  05/04/2020 09:10 AM 2.91 (H) 0.61 - 1.24 mg/dL Final  05/03/2020 01:41 AM 2.80 (H) 0.61 - 1.24 mg/dL Final  05/02/2020 10:06 AM 2.78 (H) 0.61 - 1.24 mg/dL Final  05/01/2020 05:35 AM 2.87 (H) 0.61 - 1.24 mg/dL Final  04/30/2020 05:45 AM 2.69 (H) 0.61 - 1.24 mg/dL Final  04/13/2020 04:40 PM 2.00 (H) 0.61 - 1.24 mg/dL Final  04/22/2020 04:06 PM 1.79 (H) 0.61 - 1.24 mg/dL Final    PMH:  History reviewed. No pertinent past medical history.  PSH:  History reviewed. No pertinent surgical history.  Allergies:  Allergies  Allergen Reactions  . Acetaminophen Other (See Comments)    "SHUTS DOWN MY ORGANS"  . Gabapentin Other (See Comments)    SUICIDAL IDEATION- Suicidal thoughts and actions     Medications:   Prior to Admission medications   Medication Sig Start Date End Date Taking? Authorizing Provider  acetaminophen (TYLENOL) 325 MG tablet  Place 650 mg into feeding tube every 6 (six) hours as needed for mild pain.   Yes [provider]  alum & mag hydroxide-simeth (MAG-AL PLUS) 200-200-20 MG/5ML suspension Place 30 mLs into feeding tube every 6 (six) hours as needed (GERD WITH ESOPHAGITIS WITHOUT BLEEDING).   Yes [provider]  amiodarone (PACERONE) 200 MG tablet Place 200 mg into feeding tube in the morning.   Yes [provider]  amLODipine (NORVASC) 5 MG tablet Place 5 mg into feeding tube daily.   Yes [provider]  chlorhexidine (PERIDEX) 0.12 % solution 15 mLs by Mouth Rinse route 2 (two) times daily.   Yes [provider]  clonazePAM (KLONOPIN) 0.5 MG tablet Place 0.5 mg into feeding tube every 6 (six) hours as needed (for generalized anxiety disorder).   Yes [provider]  fentaNYL (DURAGESIC) 100 MCG/HR Place 1 patch onto the skin every 3 (three) days.   Yes [provider]  fentaNYL (DURAGESIC) 50 MCG/HR Place 1 patch onto the skin every 3 (three) days.   Yes [provider]  ferrous sulfate 220 (44 Fe) MG/5ML solution Place 220 mg into feeding tube in the morning and at bedtime.   Yes [provider]  fludrocortisone (FLORINEF) 0.1 MG tablet 0.2 mg See admin instructions. 0.2 mg, per G-Tube, every twelve hours   Yes [provider]  furosemide (LASIX) 40 MG tablet Place 40 mg into feeding tube in  the morning.   Yes [provider]  ipratropium-albuterol (DUONEB) 0.5-2.5 (3) MG/3ML SOLN Take 3 mLs by nebulization every 4 (four) hours as needed (for acute and chronic respiratory failure with hypercapnia).   Yes [provider]  lansoprazole (PREVACID SOLUTAB) 30 MG disintegrating tablet Place 30 mg into feeding tube in the morning.   Yes [provider]  levothyroxine (SYNTHROID) 125 MCG tablet Place 125 mcg into feeding tube in the morning.   Yes [provider]  loperamide (IMODIUM A-D) 2 MG  tablet Place 2 mg into feeding tube every 8 (eight) hours as needed for diarrhea or loose stools.   Yes [provider]  Nutritional Supplements (FEEDING SUPPLEMENT, VITAL 1.5 CAL,) LIQD Place 50 mL/hr into feeding tube continuous.   Yes [provider]  ondansetron (ZOFRAN-ODT) 4 MG disintegrating tablet 4 mg See admin instructions. 4 mg, per G-Tube, every six hours as needed for nausea   Yes [provider]  Oxycodone HCl 10 MG TABS Place 10 mg into feeding tube every 6 (six) hours as needed (for severe pain).   Yes [provider]  PRESCRIPTION MEDICATION Inject 42 mL/hr into the vein See admin instructions. D10W (dextrose 10% in water): 42 ml's/hr "by shift"   Yes [provider]  sertraline (ZOLOFT) 100 MG tablet Place 100 mg into feeding tube daily.   Yes [provider]  Sodium Chloride Flush (NORMAL SALINE FLUSH) 0.9 % SOLN Inject 10 mLs into the vein See admin instructions. Flush with 10 ml's of normal saline before and after medications   Yes [provider]  traZODone (DESYREL) 100 MG tablet Place 100 mg into feeding tube at bedtime.   Yes [provider]    Inpatient medications: . chlorhexidine gluconate (MEDLINE KIT)  15 mL Mouth Rinse BID  . Chlorhexidine Gluconate Cloth  6 each Topical Daily  . feeding supplement (PROSource TF)  90 mL Per Tube TID  . fentaNYL  1 patch Transdermal Q72H   And  . fentaNYL  1 patch Transdermal Q72H  . fludrocortisone  0.2 mg Per Tube BID  . heparin injection (subcutaneous)  5,000 Units Subcutaneous Q8H  . insulin aspart  0-9 Units Subcutaneous Q4H  . levothyroxine  125 mcg Per Tube Q0600  . mouth rinse  15 mL Mouth Rinse 10 times per day  . midodrine  2.5 mg Per Tube TID WC  . multivitamin with minerals  1 tablet Per Tube Daily  . nutrition supplement (JUVEN)  1 packet Per Tube BID BM  . pantoprazole sodium  40 mg Per Tube Daily  . sertraline  100 mg Per Tube Daily  .  sodium chloride flush  10-40 mL Intracatheter Q12H  . traZODone  100 mg Per Tube QHS    Discontinued Meds:   Medications Discontinued During This Encounter  Medication Reason  . lactated ringers infusion   . lactated ringers infusion   . pantoprazole (PROTONIX) injection 40 mg   . ceFEPIme (MAXIPIME) 2 g in sodium chloride 0.9 % 100 mL IVPB   . vancomycin variable dose per unstable renal function (pharmacist dosing)   . polyethylene glycol (MIRALAX / GLYCOLAX) packet 17 g   . docusate sodium (COLACE) capsule 100 mg   . ondansetron (ZOFRAN) injection 4 mg   . potassium chloride 10 mEq in 50 mL *CENTRAL LINE* IVPB   . pantoprazole (PROTONIX) injection 40 mg   . LORazepam (ATIVAN) injection 0.5 mg   . oxyCODONE (Oxy IR/ROXICODONE) immediate release  tablet 10 mg   . traZODone (DESYREL) tablet 100 mg   . sertraline (ZOLOFT) tablet 100 mg   . meropenem (MERREM) 2 g in sodium chloride 0.9 % 100 mL IVPB   . albumin human 25 % solution 50 g   . norepinephrine (LEVOPHED) 63m in 2568mpremix infusion   . furosemide (LASIX) injection 40 mg   . midodrine (PROAMATINE) tablet 5 mg   . midodrine (PROAMATINE) tablet 5 mg     Social History:  has no history on file for tobacco use, alcohol use, and drug use.  Family History:  History reviewed. No pertinent family history.  Pertinent items are noted in HPI. Weight change: -2.7 kg  Intake/Output Summary (Last 24 hours) at 05/05/2020 1424 Last data filed at 05/05/2020 1347 Gross per 24 hour  Intake 1683.57 ml  Output 1515 ml  Net 168.57 ml   BP 112/77   Pulse 79   Temp (!) 97.4 F (36.3 C) (Oral)   Resp (!) 26   Ht '6\' 2"'  (1.88 m)   Wt 105.1 kg   SpO2 98%   BMI 29.75 kg/m  Vitals:   05/05/20 1115 05/05/20 1140 05/05/20 1200 05/05/20 1300  BP:   112/73 112/77  Pulse:   80 79  Resp:   (!) 0 (!) 26  Temp:  (!) 97.4 F (36.3 C)    TempSrc:  Oral    SpO2: 97%  97% 98%  Weight:      Height:         General appearance: chronically  ill-appearing Head: atraumatic, bitemporal wasting Resp: clear to auscultation bilaterally Cardio: regular rate and rhythm, S1, S2 normal, no murmur, click, rub or gallop GI: soft, non-tender; bowel sounds normal; no masses,  no organomegaly and PEG in place Extremities: edema 2+ anasarca  Labs: Basic Metabolic Panel: Recent Labs  Lab 04/20/2020 1606 04/07/2020 1639 04/16/2020 1640 04/07/2020 2200 04/30/20 0545 05/01/20 0535 05/02/20 1006 05/03/20 0141 05/04/20 0910 05/05/20 0210  NA 132*   < > 137  --  135 134* 134* 135 135 139  K 2.5*   < > 2.9*  --  3.7 3.1* 3.2* 3.5 3.2* 3.6  CL 110  --  105  --  101 100 101 102 102 106  CO2 13*  --   --   --  20* 19* 18* 19* 18* 17*  GLUCOSE 258*  --  180*  --  74 108* 94 96 107* 132*  BUN 42*  --  61*  --  62* 70* 81* 86* 99* 108*  CREATININE 1.79*  --  2.00*  --  2.69* 2.87* 2.78* 2.80* 2.91* 2.77*  ALBUMIN <1.0*  --   --   --   --   --  1.7* 2.0*  --   --   CALCIUM 5.0*  --   --   --  8.0* 8.2* 8.1* 8.3* 8.3* 8.5*  PHOS  --   --   --  7.5* 7.9*  --   --   --   --   --    < > = values in this interval not displayed.   Liver Function Tests: Recent Labs  Lab 04/27/2020 1606 05/02/20 1006 05/03/20 0141  AST 20 14* 13*  ALT '9 9 8  ' ALKPHOS 85 97 100  BILITOT 0.6 0.5 0.9  PROT 3.9* 6.1* 6.2*  ALBUMIN <1.0* 1.7* 2.0*   No results for input(s): LIPASE, AMYLASE in the last 168 hours. No results for input(s):  AMMONIA in the last 168 hours. CBC: Recent Labs  Lab 04/06/2020 2200 04/30/20 0545 05/01/20 0535 05/02/20 1006 05/03/20 0141 05/05/20 0436  WBC 15.5*   < > 8.9 6.2 6.4 7.5  NEUTROABS 13.8*  --   --  5.1 4.4  --   HGB 7.2*   < > 8.5* 7.7* 7.3* 7.6*  HCT 22.4*   < > 26.0* 23.7* 22.1* 23.0*  MCV 91.4   < > 88.1 86.8 86.3 88.8  PLT 179   < > 179 157 113* 144*   < > = values in this interval not displayed.   PT/INR: '@LABRCNTIP' (inr:5) Cardiac Enzymes: ) Recent Labs  Lab 05/04/20 1701  CKTOTAL 8*   CBG: Recent Labs  Lab  05/04/20 1956 05/04/20 2340 05/05/20 0337 05/05/20 0810 05/05/20 1138  GLUCAP 108* 117* 126* 108* 111*    Iron Studies: No results for input(s): IRON, TIBC, TRANSFERRIN, FERRITIN in the last 168 hours.  Xrays/Other Studies: US RENAL  Result Date: 05/04/2020 CLINICAL DATA:  Renal failure. EXAM: RENAL / URINARY TRACT ULTRASOUND COMPLETE COMPARISON:  None. FINDINGS: Right Kidney: Renal measurements: 11.2 cm x 5.9 cm x 6.2 cm = volume: 216.67 mL. Echogenicity within normal limits. No mass or hydronephrosis visualized. Left Kidney: Renal measurements: 11.3 cm x 6.3 cm x 6.2 cm = volume: 231.19 mL. Echogenicity within normal limits. No mass or hydronephrosis visualized. Bladder: Not visualized. Other: None. IMPRESSION: Normal renal ultrasound. Electronically Signed   By: Virgina Norfolk M.D.   On: 05/04/2020 21:35     Assessment/Plan: 1.  AKI - likely ischemic ATN in setting of septic shock requiring pressors.  Was started on IV bicarb and is also on florinef and midodrine for chronic hypotension.  BUN has been climbing and Scr has fluctuated 2.7-2.9.  No urgent indication for dialysis at this time but may require CRRT if he does not respond to IV bicarb.  May try IV lasix tomorrow if no significant increase in UOP overnight.  2. Sepsis due to klebsiella bacteremia - on meropenem and currently off pressors but requires midodrine and florinef for chronic hypotension. 3. Metabolic Acidosis - due to #1 4. J tube drainage - improved.   5. Severe protein malnutrition - tube feeds restarted and agree with IV albumin 6. Acute on chronic diastolic CHF (LVEF 43-88% with grade II DD).  Also has anasarca from hypoalbuminemia.  Hold off on diuretics for now and see response from IV alb. 7. 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  On vent via trach 8. Multiple pressure ulcers/wounds - wound care 9. Chronic pain - on fentanyl patch and oxycodone.    Phillip Klein  Phillip Klein 05/05/2020, 2:24 PM

## 2020-05-06 DIAGNOSIS — R7881 Bacteremia: Secondary | ICD-10-CM | POA: Diagnosis not present

## 2020-05-06 DIAGNOSIS — I959 Hypotension, unspecified: Secondary | ICD-10-CM | POA: Diagnosis not present

## 2020-05-06 DIAGNOSIS — A4159 Other Gram-negative sepsis: Secondary | ICD-10-CM | POA: Diagnosis not present

## 2020-05-06 DIAGNOSIS — B182 Chronic viral hepatitis C: Secondary | ICD-10-CM | POA: Diagnosis not present

## 2020-05-06 LAB — GLUCOSE, CAPILLARY
Glucose-Capillary: 99 mg/dL (ref 70–99)
Glucose-Capillary: 94 mg/dL (ref 70–99)
Glucose-Capillary: 97 mg/dL (ref 70–99)
Glucose-Capillary: 109 mg/dL — ABNORMAL HIGH (ref 70–99)
Glucose-Capillary: 106 mg/dL — ABNORMAL HIGH (ref 70–99)
Glucose-Capillary: 103 mg/dL — ABNORMAL HIGH (ref 70–99)

## 2020-05-06 LAB — CBC
HCT: 22.9 % — ABNORMAL LOW (ref 39.0–52.0)
Hemoglobin: 7.1 g/dL — ABNORMAL LOW (ref 13.0–17.0)
MCH: 28 pg (ref 26.0–34.0)
MCHC: 31 g/dL (ref 30.0–36.0)
MCV: 90.2 fL (ref 80.0–100.0)
Platelets: 158 10*3/uL (ref 150–400)
RBC: 2.54 MIL/uL — ABNORMAL LOW (ref 4.22–5.81)
RDW: 16.1 % — ABNORMAL HIGH (ref 11.5–15.5)
WBC: 7.8 10*3/uL (ref 4.0–10.5)
nRBC: 0 % (ref 0.0–0.2)

## 2020-05-06 LAB — RENAL FUNCTION PANEL
Albumin: 2.3 g/dL — ABNORMAL LOW (ref 3.5–5.0)
Anion gap: 15 (ref 5–15)
BUN: 117 mg/dL — ABNORMAL HIGH (ref 6–20)
CO2: 17 mmol/L — ABNORMAL LOW (ref 22–32)
Calcium: 8.5 mg/dL — ABNORMAL LOW (ref 8.9–10.3)
Chloride: 105 mmol/L (ref 98–111)
Creatinine, Ser: 2.58 mg/dL — ABNORMAL HIGH (ref 0.61–1.24)
GFR, Estimated: 29 mL/min — ABNORMAL LOW (ref >60)
Glucose, Bld: 106 mg/dL — ABNORMAL HIGH (ref 70–99)
Phosphorus: 6.1 mg/dL — ABNORMAL HIGH (ref 2.5–4.6)
Potassium: 3.9 mmol/L (ref 3.5–5.1)
Sodium: 137 mmol/L (ref 135–145)

## 2020-05-06 MED ORDER — FUROSEMIDE 10 MG/ML IJ SOLN
80.0000 mg | Freq: Once | INTRAMUSCULAR | Status: AC
  Administered 2020-05-06: 80 mg via INTRAVENOUS
  Filled 2020-05-06: qty 8

## 2020-05-06 MED ORDER — OXYCODONE HCL 5 MG PO TABS
5.0000 mg | ORAL_TABLET | Freq: Four times a day (QID) | ORAL | Status: DC | PRN
  Administered 2020-05-06 – 2020-06-03 (×61): 5 mg
  Filled 2020-05-06 (×64): qty 1

## 2020-05-06 MED ORDER — CHLORHEXIDINE GLUCONATE 0.12 % MT SOLN
OROMUCOSAL | Status: AC
  Filled 2020-05-06: qty 15

## 2020-05-06 NOTE — Progress Notes (Signed)
Patient ID: Phillip Klein, male   DOB: Nov 12, 1965, 55 y.o.   MRN: [id number] S: No events overnight. O:BP 129/86   Pulse 80   Temp 98.3 F (36.8 C) (Oral)   Resp (!) 26   Ht 6' 2" (1.88 m)   Wt 107 kg   SpO2 99%   BMI 30.29 kg/m   Intake/Output Summary (Last 24 hours) at 05/06/2020 1015 Last data filed at 05/06/2020 0900 Gross per 24 hour  Intake 2550.46 ml  Output 1000 ml  Net 1550.46 ml   Intake/Output: I/O last 3 completed shifts: In: 3416.6 [I.V.:827.9; NG/GT:1920; IV Piggyback:668.6] Out: 1590 [Urine:875; Stool:715]  Intake/Output this shift:  Total I/O In: 320 [I.V.:200; NG/GT:120] Out: 100 [Urine:100] Weight change: 1.9 kg Gen: chronically ill-appearing WM on vent via trach CVS: RRR Resp: decreased BS at bases Abd: +BS, soft, NT/ND +PEG Ext: 2+ anasarca  Recent Labs  Lab 04/23/2020 1606 04/28/2020 1639 04/09/2020 2200 04/30/20 0545 05/01/20 0535 05/02/20 1006 05/03/20 0141 05/04/20 0910 05/05/20 0210 05/06/20 0353  NA 132*   < >  --  135 134* 134* 135 135 139 137  K 2.5*   < >  --  3.7 3.1* 3.2* 3.5 3.2* 3.6 3.9  CL 110   < >  --  101 100 101 102 102 106 105  CO2 13*  --   --  20* 19* 18* 19* 18* 17* 17*  GLUCOSE 258*   < >  --  74 108* 94 96 107* 132* 106*  BUN 42*   < >  --  62* 70* 81* 86* 99* 108* 117*  CREATININE 1.79*   < >  --  2.69* 2.87* 2.78* 2.80* 2.91* 2.77* 2.58*  ALBUMIN <1.0*  --   --   --   --  1.7* 2.0*  --   --  2.3*  CALCIUM 5.0*  --   --  8.0* 8.2* 8.1* 8.3* 8.3* 8.5* 8.5*  PHOS  --   --  7.5* 7.9*  --   --   --   --   --  6.1*  AST 20  --   --   --   --  14* 13*  --   --   --   ALT 9  --   --   --   --  9 8  --   --   --    < > = values in this interval not displayed.   Liver Function Tests: Recent Labs  Lab 05/01/2020 1606 05/02/20 1006 05/03/20 0141 05/06/20 0353  AST 20 14* 13*  --   ALT _0 --   ALKPHOS 85 97 100  --   BILITOT 0.6 0.5 0.9  --   PROT 3.9* 6.1* 6.2*  --   ALBUMIN <1.0* 1.7* 2.0* 2.3*   No results for  input(s): LIPASE, AMYLASE in the last 168 hours. No results for input(s): AMMONIA in the last 168 hours. CBC: Recent Labs  Lab 04/09/2020 2200 04/30/20 0545 05/01/20 0535 05/02/20 1006 05/03/20 0141 05/05/20 0436 05/06/20 0353  WBC 15.5*   < > 8.9 6.2 6.4 7.5 7.8  NEUTROABS 13.8*  --   --  5.1 4.4  --   --   HGB 7.2*   < > 8.5* 7.7* 7.3* 7.6* 7.1*  HCT 22.4*   < > 26.0* 23.7* 22.1* 23.0* 22.9*  MCV 91.4   < > 88.1 86.8 86.3 88.8 90.2  PLT 179   < >  179 157 113* 144* 158   < > = values in this interval not displayed.   Cardiac Enzymes: Recent Labs  Lab 05/04/20 1701  CKTOTAL 8*   CBG: Recent Labs  Lab 05/05/20 1532 05/05/20 2005 05/05/20 2338 05/06/20 0354 05/06/20 0800  GLUCAP 112* 123* 102* 99 94    Iron Studies: No results for input(s): IRON, TIBC, TRANSFERRIN, FERRITIN in the last 72 hours. Studies/Results: US RENAL  Result Date: 05/04/2020 CLINICAL DATA:  Renal failure. EXAM: RENAL / URINARY TRACT ULTRASOUND COMPLETE COMPARISON:  None. FINDINGS: Right Kidney: Renal measurements: 11.2 cm x 5.9 cm x 6.2 cm = volume: 216.67 mL. Echogenicity within normal limits. No mass or hydronephrosis visualized. Left Kidney: Renal measurements: 11.3 cm x 6.3 cm x 6.2 cm = volume: 231.19 mL. Echogenicity within normal limits. No mass or hydronephrosis visualized. Bladder: Not visualized. Other: None. IMPRESSION: Normal renal ultrasound. Electronically Signed   By: Thaddeus  Houston M.D.   On: 05/04/2020 21:35   . chlorhexidine gluconate (MEDLINE KIT)  15 mL Mouth Rinse BID  . Chlorhexidine Gluconate Cloth  6 each Topical Daily  . feeding supplement (PROSource TF)  90 mL Per Tube TID  . fentaNYL  1 patch Transdermal Q72H   And  . fentaNYL  1 patch Transdermal Q72H  . fludrocortisone  0.2 mg Per Tube BID  . heparin injection (subcutaneous)  5,000 Units Subcutaneous Q8H  . insulin aspart  0-9 Units Subcutaneous Q4H  . levothyroxine  125 mcg Per Tube Q0600  . mouth rinse  15 mL Mouth  Rinse 10 times per day  . multivitamin with minerals  1 tablet Per Tube Daily  . nutrition supplement (JUVEN)  1 packet Per Tube BID BM  . pantoprazole sodium  40 mg Per Tube Daily  . sertraline  100 mg Per Tube Daily  . sodium chloride flush  10-40 mL Intracatheter Q12H  . traZODone  100 mg Per Tube QHS    BMET    Component Value Date/Time   NA 137 05/06/2020 0353   K 3.9 05/06/2020 0353   CL 105 05/06/2020 0353   CO2 17 (L) 05/06/2020 0353   GLUCOSE 106 (H) 05/06/2020 0353   BUN 117 (H) 05/06/2020 0353   CREATININE 2.58 (H) 05/06/2020 0353   CALCIUM 8.5 (L) 05/06/2020 0353   GFRNONAA 29 (L) 05/06/2020 0353   CBC    Component Value Date/Time   WBC 7.8 05/06/2020 0353   RBC 2.54 (L) 05/06/2020 0353   HGB 7.1 (L) 05/06/2020 0353   HCT 22.9 (L) 05/06/2020 0353   PLT 158 05/06/2020 0353   MCV 90.2 05/06/2020 0353   MCH 28.0 05/06/2020 0353   MCHC 31.0 05/06/2020 0353   RDW 16.1 (H) 05/06/2020 0353   LYMPHSABS 1.2 05/03/2020 0141   MONOABS 0.4 05/03/2020 0141   EOSABS 0.4 05/03/2020 0141   BASOSABS 0.0 05/03/2020 0141    Assessment/Plan: 1.  AKI - likely ischemic ATN in setting of septic shock requiring pressors.  Was started on IV bicarb and is also on florinef and midodrine for chronic hypotension.  BUN has been climbing and Scr has fluctuated 2.7-2.9 and now down to 2.58 with increase in UOP but remains oliguric. 1. No urgent indication for dialysis at this time but may require CRRT if he does not respond to IV bicarb.   2. Will try a dose of IV lasix today since his UOP is starting to improve but remains with significant anasarca. 3. Continue with IV albumin    2. Sepsis due to klebsiella bacteremia - on meropenem and currently off pressors but requires midodrine and florinef for chronic hypotension. 3. Metabolic Acidosis - due to #1 continue with IV bicarb. 4. J tube drainage - improved.   5. Severe protein malnutrition - tube feeds restarted and agree with IV  albumin 6. Acute on chronic diastolic CHF (LVEF 45-50% with grade II DD).  Also has anasarca from hypoalbuminemia.  Will try IV lasix today and follow UOP. 7. 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  On vent via trach 8. Multiple pressure ulcers/wounds - wound care 9. Chronic pain - on fentanyl patch and oxycodone.     A. , MD [facility] [phone]  

## 2020-05-06 NOTE — Progress Notes (Signed)
NAME:  Phillip Klein, MRN:  [id number], DOB:  Apr 17, 1965, LOS: 7 ADMISSION DATE:  04/28/2020, CONSULTATION DATE:  2/27 REFERRING MD:  Silverio Lay, CHIEF COMPLAINT:  Sepsis   Brief History:  54yo male who presented from [facility] for evaluation of hypotension. PCCM consulted for further management of sepsis and hypotension.  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 Malnutrition Neurogenic astrostatic hypotension  HFrEF Untreated HCV Previous IVDU Cirrhosis  Chronic pain  Significant Hospital Events:  Admitted 2/24  Consults:  CCM ID  Procedures:  Trach present prior to admission Hickman catheter prior to admission > removed 2/26  Significant Diagnostic Tests:    Micro Data:  2/24 blood > [name] ESBL 1/4 bottle 2/26 urine > pending  Antimicrobials:  2/24 cefepime> 2/25 2/24 vanc > 2/25  2/25 mero >    Interim History / Subjective:   Creatinine a little better Made a little more urine Drowsy   Objective   Blood pressure (!) 165/88, pulse 81, temperature 98.3 F (36.8 C), temperature source Oral, resp. rate (!) 26, height 6\' 2"  (1.88 m), weight 107 kg, SpO2 99 %.    Vent Mode: PRVC FiO2 (%):  [40 %] 40 % Set Rate:  [26 bmp] 26 bmp Vt Set:  [510 mL] 510 mL PEEP:  [8 cmH20] 8 cmH20 Plateau Pressure:  [20 cmH20] 20 cmH20   Intake/Output Summary (Last 24 hours) at 05/06/2020 0840 Last data filed at 05/06/2020 0500 Gross per 24 hour  Intake 2360.5 ml  Output 1000 ml  Net 1360.5 ml   Filed Weights   05/04/20 0436 05/05/20 0500 05/06/20 0324  Weight: 107.8 kg 105.1 kg 107 kg    Examination:  General:  In bed on vent HENT: NCAT tracheostomy in place PULM: CTA B, vent supported breathing CV: RRR, no mgr GI: BS+, soft, nontender MSK: no bulk or tone Neuro: drowsy    Resolved  Hospital Problem list   Septic shock  Assessment & Plan:  [name] bacteremia from UTI vs line infection Hickman catheter removed Has pacemaker Continue meropenem for 10 days then repeat blood culture after  Would not place central line until his renal function has returned to normal  AKI Appreciate renal assistance, Lasix today  Will defer to what to do with bicarb infusion to renal Monitor BMET and UOP Replace electrolytes as needed  Jejunostomy tube drainage> improved Continue feeding through J tube  Hypotension likely related to dysautonomia Midodrine start today Continue florinef Continue florinef Stop midodrine  Chronic respiratory failure with hypoxemia Full mechanical vent support VAP prevention Daily WUA/SBT  Multiple pressure ulcers/wounds present on admission Wound care  Right heat failure Anasarca/swelling due to RHF and poor nutrition Careful with volume resusciation  Chronic pain Drowsy 3/3 AM> suspect related to narcotics Continue fentanyl patch Reduce oxycodone prn  Depression, insomnia Sertraline, trazodone per home doses Continue clonazepam  GERD  Patroprazole to continue  Best practice (evaluated daily)  Diet: tube feeding Pain/Anxiety/Delirium protocol (if indicated): n/a VAP protocol (if indicated): yes DVT prophylaxis: start sub q heparin GI prophylaxis: n/a Glucose control: SSI Mobility: bed rest Disposition:here until 3/1 then back to [facility]  Goals of Care:  Last date of multidisciplinary goals of care discussion:3/3  Family and staff present: wife, bedside nurse, patient, 07/06/20 Summary of discussion: full scope of practice, "keep me alive at all costs" Follow up  goals of care discussion due: 3/10   Code Status: full  Labs   CBC: Recent Labs  Lab 04/07/2020 2200 04/30/20 0545 05/01/20 0535 05/02/20 1006 05/03/20 0141 05/05/20 0436 05/06/20 0353  WBC 15.5*   < > 8.9 6.2 6.4 7.5 7.8  NEUTROABS 13.8*  --   --  5.1  4.4  --   --   HGB 7.2*   < > 8.5* 7.7* 7.3* 7.6* 7.1*  HCT 22.4*   < > 26.0* 23.7* 22.1* 23.0* 22.9*  MCV 91.4   < > 88.1 86.8 86.3 88.8 90.2  PLT 179   < > 179 157 113* 144* 158   < > = values in this interval not displayed.    Basic Metabolic Panel: Recent Labs  Lab 05/02/2020 1606 04/09/2020 1639 04/25/2020 2200 04/30/20 0545 05/01/20 0535 05/02/20 1006 05/03/20 0141 05/04/20 0910 05/05/20 0210 05/06/20 0353  NA 132*   < >  --  135   < > 134* 135 135 139 137  K 2.5*   < >  --  3.7   < > 3.2* 3.5 3.2* 3.6 3.9  CL 110   < >  --  101   < > 101 102 102 106 105  CO2 13*  --   --  20*   < > 18* 19* 18* 17* 17*  GLUCOSE 258*   < >  --  74   < > 94 96 107* 132* 106*  BUN 42*   < >  --  62*   < > 81* 86* 99* 108* 117*  CREATININE 1.79*   < >  --  2.69*   < > 2.78* 2.80* 2.91* 2.77* 2.58*  CALCIUM 5.0*  --   --  8.0*   < > 8.1* 8.3* 8.3* 8.5* 8.5*  MG 1.0*  --  1.8 2.0  --   --   --   --  1.9  --   PHOS  --   --  7.5* 7.9*  --   --   --   --   --  6.1*   < > = values in this interval not displayed.   GFR: Estimated Creatinine Clearance: 43.6 mL/min (A) (by C-G formula based on SCr of 2.58 mg/dL (H)). Recent Labs  Lab 05/03/2020 1606 05/02/2020 2200 04/30/20 0545 05/02/20 1006 05/03/20 0141 05/05/20 0436 05/06/20 0353  PROCALCITON  --  34.80  --   --   --   --   --   WBC  --  15.5*   < > 6.2 6.4 7.5 7.8  LATICACIDVEN 0.7  --   --   --   --   --   --    < > = values in this interval not displayed.    Liver Function Tests: Recent Labs  Lab 04/18/2020 1606 05/02/20 1006 05/03/20 0141 05/06/20 0353  AST 20 14* 13*  --   ALT 9 9 8   --   ALKPHOS 85 97 100  --   BILITOT 0.6 0.5 0.9  --   PROT 3.9* 6.1* 6.2*  --   ALBUMIN <1.0* 1.7* 2.0* 2.3*   No results for input(s): LIPASE, AMYLASE in the last 168 hours. No results for input(s): AMMONIA in the last 168 hours.  ABG    Component Value Date/Time   HCO3 16.5 (L) 04/15/2020 1639   TCO2 18 (L) 04/25/2020 1640   ACIDBASEDEF  7.0 (H) 04/20/2020 1639   O2SAT 100.0 04/13/2020 1639  Coagulation Profile: Recent Labs  Lab 2020-05-27 2200  INR 1.4*    Cardiac Enzymes: Recent Labs  Lab 05/04/20 1701  CKTOTAL 8*    HbA1C: Hgb A1c MFr Bld  Date/Time Value Ref Range Status  05-27-2020 10:00 PM 5.1 4.8 - 5.6 % Final    Comment:    (NOTE) Pre diabetes:          5.7%-6.4%  Diabetes:              >6.4%  Glycemic control for   <7.0% adults with diabetes     CBG: Recent Labs  Lab 05/05/20 1532 05/05/20 2005 05/05/20 2338 05/06/20 0354 05/06/20 0800  GLUCAP 112* 123* 102* 99 94     Critical care time: n/a     Heber [place], MD [facility] Pager: [phone] Cell: [phone] If no response, please call [phone] until 7pm After 7:00 pm call Elink  [phone]

## 2020-05-07 DIAGNOSIS — A498 Other bacterial infections of unspecified site: Secondary | ICD-10-CM | POA: Diagnosis not present

## 2020-05-07 DIAGNOSIS — N179 Acute kidney failure, unspecified: Secondary | ICD-10-CM | POA: Diagnosis not present

## 2020-05-07 DIAGNOSIS — Z1612 Extended spectrum beta lactamase (ESBL) resistance: Secondary | ICD-10-CM | POA: Diagnosis not present

## 2020-05-07 DIAGNOSIS — A419 Sepsis, unspecified organism: Secondary | ICD-10-CM | POA: Diagnosis not present

## 2020-05-07 DIAGNOSIS — I959 Hypotension, unspecified: Secondary | ICD-10-CM | POA: Diagnosis not present

## 2020-05-07 DIAGNOSIS — A4159 Other Gram-negative sepsis: Secondary | ICD-10-CM | POA: Diagnosis not present

## 2020-05-07 LAB — CBC WITH DIFFERENTIAL/PLATELET
Abs Immature Granulocytes: 0.04 10*3/uL (ref 0.00–0.07)
Basophils Absolute: 0 10*3/uL (ref 0.0–0.1)
Basophils Relative: 0 %
Eosinophils Absolute: 1 10*3/uL — ABNORMAL HIGH (ref 0.0–0.5)
Eosinophils Relative: 14 %
HCT: 22.8 % — ABNORMAL LOW (ref 39.0–52.0)
Hemoglobin: 7 g/dL — ABNORMAL LOW (ref 13.0–17.0)
Immature Granulocytes: 1 %
Lymphocytes Relative: 22 %
Lymphs Abs: 1.6 10*3/uL (ref 0.7–4.0)
MCH: 27.5 pg (ref 26.0–34.0)
MCHC: 30.7 g/dL (ref 30.0–36.0)
MCV: 89.4 fL (ref 80.0–100.0)
Monocytes Absolute: 0.4 10*3/uL (ref 0.1–1.0)
Monocytes Relative: 6 %
Neutro Abs: 4.1 10*3/uL (ref 1.7–7.7)
Neutrophils Relative %: 57 %
Platelets: 148 10*3/uL — ABNORMAL LOW (ref 150–400)
RBC: 2.55 MIL/uL — ABNORMAL LOW (ref 4.22–5.81)
RDW: 16.1 % — ABNORMAL HIGH (ref 11.5–15.5)
WBC: 7.2 10*3/uL (ref 4.0–10.5)
nRBC: 0 % (ref 0.0–0.2)

## 2020-05-07 LAB — RENAL FUNCTION PANEL
Albumin: 2.4 g/dL — ABNORMAL LOW (ref 3.5–5.0)
Anion gap: 14 (ref 5–15)
BUN: 136 mg/dL — ABNORMAL HIGH (ref 6–20)
CO2: 21 mmol/L — ABNORMAL LOW (ref 22–32)
Calcium: 8.6 mg/dL — ABNORMAL LOW (ref 8.9–10.3)
Chloride: 101 mmol/L (ref 98–111)
Creatinine, Ser: 2.61 mg/dL — ABNORMAL HIGH (ref 0.61–1.24)
GFR, Estimated: 29 mL/min — ABNORMAL LOW (ref >60)
Glucose, Bld: 114 mg/dL — ABNORMAL HIGH (ref 70–99)
Phosphorus: 6 mg/dL — ABNORMAL HIGH (ref 2.5–4.6)
Potassium: 3.8 mmol/L (ref 3.5–5.1)
Sodium: 136 mmol/L (ref 135–145)

## 2020-05-07 LAB — GLUCOSE, CAPILLARY
Glucose-Capillary: 102 mg/dL — ABNORMAL HIGH (ref 70–99)
Glucose-Capillary: 108 mg/dL — ABNORMAL HIGH (ref 70–99)
Glucose-Capillary: 110 mg/dL — ABNORMAL HIGH (ref 70–99)
Glucose-Capillary: 110 mg/dL — ABNORMAL HIGH (ref 70–99)
Glucose-Capillary: 114 mg/dL — ABNORMAL HIGH (ref 70–99)

## 2020-05-07 LAB — COMPREHENSIVE METABOLIC PANEL
ALT: 12 U/L (ref 0–44)
AST: 22 U/L (ref 15–41)
Albumin: 2.4 g/dL — ABNORMAL LOW (ref 3.5–5.0)
Alkaline Phosphatase: 158 U/L — ABNORMAL HIGH (ref 38–126)
Anion gap: 14 (ref 5–15)
BUN: 136 mg/dL — ABNORMAL HIGH (ref 6–20)
CO2: 22 mmol/L (ref 22–32)
Calcium: 8.7 mg/dL — ABNORMAL LOW (ref 8.9–10.3)
Chloride: 103 mmol/L (ref 98–111)
Creatinine, Ser: 2.62 mg/dL — ABNORMAL HIGH (ref 0.61–1.24)
GFR, Estimated: 29 mL/min — ABNORMAL LOW (ref >60)
Glucose, Bld: 117 mg/dL — ABNORMAL HIGH (ref 70–99)
Potassium: 3.9 mmol/L (ref 3.5–5.1)
Sodium: 139 mmol/L (ref 135–145)
Total Bilirubin: 0.8 mg/dL (ref 0.3–1.2)
Total Protein: 6.3 g/dL — ABNORMAL LOW (ref 6.5–8.1)

## 2020-05-07 MED FILL — Fentanyl Citrate Preservative Free (PF) Inj 100 MCG/2ML: INTRAMUSCULAR | Qty: 0.5 | Status: AC

## 2020-05-07 NOTE — Progress Notes (Signed)
NAME:  Viral Schramm, MRN:  [id number], DOB:  05/26/1965, LOS: 8 ADMISSION DATE:  04/12/2020, CONSULTATION DATE:  2/27 REFERRING MD:  Darl Householder, CHIEF COMPLAINT:  Sepsis   Brief History:  55 yo male who presented from [facility] for evaluation of hypotension. PCCM consulted for further management of sepsis and hypotension.  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 Malnutrition Neurogenic astrostatic hypotension  HFrEF Untreated HCV Previous IVDU Cirrhosis  Chronic pain  Significant Hospital Events:  Admitted 2/24  Consults:  CCM ID  Procedures:  Trach present prior to admission Hickman catheter prior to admission > removed 2/26  Significant Diagnostic Tests:    Micro Data:  2/24 blood > [name] ESBL 1/4 bottle 2/26 urine > multiple species present>> suggest recollect  Antimicrobials:  2/24 cefepime> 2/25 2/24 vanc > 2/25  2/25 mero >    Interim History / Subjective:  Awake and alert. following commands  Remains on full vent support Ready to go back to [facility], but no bed. Will also add to [facility] lest Creatinine stable at 2.62, but BUN increased to 136 Alk Phos 158 HGB 7, WBC 7.2, platelets 148 + 3900 cc's , 1075 UO last 24 hours T max 98.5 Secretions are white per nursing   Objective   Blood pressure (!) 141/82, pulse 93, temperature 98.4 F (36.9 C), temperature source Oral, resp. rate 17, height '6\' 2"'  (1.88 m), weight 110.1 kg, SpO2 97 %.    Vent Mode: PRVC FiO2 (%):  [40 %] 40 % Set Rate:  [26 bmp] 26 bmp Vt Set:  [510 mL] 510 mL PEEP:  [8 cmH20] 8 cmH20 Plateau Pressure:  [21 cmH20-24 cmH20] 24 cmH20   Intake/Output Summary (Last 24 hours) at 05/07/2020 1124 Last data filed at 05/07/2020 0500 Gross per 24 hour  Intake 1462.94 ml  Output 1225 ml  Net 237.94 ml   Filed  Weights   05/05/20 0500 05/06/20 0324 05/07/20 0500  Weight: 105.1 kg 107 kg 110.1 kg    Examination:  General:  In bed on vent, full mechanical support, NAD HENT: NCAT tracheostomy in place and secure, minimal secretions PULM: Bilateral chest excursion, vent supported breathing, rhonchi,  secretions are white CV: RRR, no mgr, tachy per tele GI: BS+, soft, non tender, ND, BS +, Body mass index is 31.16 kg/m. MSK: no bulk or tone, foot drop, foot braces are on Neuro: drowsy but alert, Follows commands     Resolved Hospital Problem list   Septic shock  Assessment & Plan:  [name] bacteremia from UTI vs line infection Hickman catheter removed Has pacemaker Continue meropenem for 10 days then repeat blood culture after  Would not place central line until his renal function has returned to normal Trend fever and WBC Follow micro Will recollect urine Cx  AKI likely ATN in setting of septic shock Appreciate renal assistance, Lasix 3/3, but will hold 3/4 as increase in BUN to 136 from 117 Will defer to what to do with bicarb infusion to renal>> per 3/4/note continue bicarb ( CO2 is 22) Monitor BMET and UOP Replace electrolytes as needed  Jejunostomy tube drainage> improved Continue feeding through J tube At goal  Hypotension likely related to dysautonomia Continue florinef Stop midodrine  Chronic respiratory failure with hypoxemia Full mechanical vent support VAP prevention Daily WUA/SBT  Multiple pressure ulcers/wounds present on admission Wound care  Right heat failure Anasarca/swelling due to RHF and poor nutrition Careful with volume resusciation Monitor I&O  Chronic pain Drowsy 3/3 AM> suspect related to narcotics Continue fentanyl patch Reduce oxycodone to prn  Depression, insomnia Sertraline, trazodone per home doses Continue clonazepam  GERD  Patroprazole to continue  Anemia of chronic disease HGB drop to 7 from 7.1 Plan Transfuse for HGB <  7 Monitor for bleeding in setting of increase in BUN  Best practice (evaluated daily)  Diet: tube feeding Pain/Anxiety/Delirium protocol (if indicated): n/a VAP protocol (if indicated): yes DVT prophylaxis: start sub q heparin GI prophylaxis: n/a Glucose control: SSI Mobility: bed rest Disposition:here until 3/1 then back to [facility]  Goals of Care:  Last date of multidisciplinary goals of care discussion:3/3  Family and staff present: wife, bedside nurse, patient, [place] Summary of discussion: full scope of practice, "keep me alive at all costs" Follow up goals of care discussion due: 3/10   Code Status: full  Labs   CBC: Recent Labs  Lab 05/02/20 1006 05/03/20 0141 05/05/20 0436 05/06/20 0353 05/07/20 0730  WBC 6.2 6.4 7.5 7.8 7.2  NEUTROABS 5.1 4.4  --   --  4.1  HGB 7.7* 7.3* 7.6* 7.1* 7.0*  HCT 23.7* 22.1* 23.0* 22.9* 22.8*  MCV 86.8 86.3 88.8 90.2 89.4  PLT 157 113* 144* 158 148*    Basic Metabolic Panel: Recent Labs  Lab 05/03/20 0141 05/04/20 0910 05/05/20 0210 05/06/20 0353 05/07/20 0730  NA 135 135 139 137 139  136  K 3.5 3.2* 3.6 3.9 3.9  3.8  CL 102 102 106 105 103  101  CO2 19* 18* 17* 17* 22  21*  GLUCOSE 96 107* 132* 106* 117*  114*  BUN 86* 99* 108* 117* 136*  136*  CREATININE 2.80* 2.91* 2.77* 2.58* 2.62*  2.61*  CALCIUM 8.3* 8.3* 8.5* 8.5* 8.7*  8.6*  MG  --   --  1.9  --   --   PHOS  --   --   --  6.1* 6.0*   GFR: Estimated Creatinine Clearance: 43.7 mL/min (A) (by C-G formula based on SCr of 2.61 mg/dL (H)). Recent Labs  Lab 05/03/20 0141 05/05/20 0436 05/06/20 0353 05/07/20 0730  WBC 6.4 7.5 7.8 7.2    Liver Function Tests: Recent Labs  Lab 05/02/20 1006 05/03/20 0141 05/06/20 0353 05/07/20 0730  AST 14* 13*  --  22  ALT 9 8  --  12  ALKPHOS 97 100  --  158*  BILITOT 0.5 0.9  --  0.8  PROT 6.1* 6.2*  --  6.3*  ALBUMIN 1.7* 2.0* 2.3* 2.4*  2.4*   No results for input(s): LIPASE, AMYLASE in the last 168  hours. No results for input(s): AMMONIA in the last 168 hours.  ABG    Component Value Date/Time   HCO3 16.5 (L) 04/14/2020 1639   TCO2 18 (L) 04/13/2020 1640   ACIDBASEDEF 7.0 (H) 04/30/2020 1639   O2SAT 100.0 04/08/2020 1639     Coagulation Profile: No results for input(s): INR, PROTIME in the last 168 hours.  Cardiac Enzymes: Recent Labs  Lab 05/04/20 1701  CKTOTAL 8*    HbA1C: Hgb A1c MFr Bld  Date/Time Value Ref Range Status  04/28/2020 10:00 PM 5.1 4.8 - 5.6 % Final    Comment:    (NOTE) Pre diabetes:          5.7%-6.4%  Diabetes:              >  6.4%  Glycemic control for   <7.0% adults with diabetes     CBG: Recent Labs  Lab 05/06/20 1552 05/06/20 1937 05/06/20 2329 05/07/20 0330 05/07/20 0721  GLUCAP 109* 106* 103* 102* 110*     Critical care time: n/a     Magdalen Spatz, MSN, AGACNP-BC [place] for personal pager PCCM on call pager [phone] 05/07/2020 11:24 AM

## 2020-05-07 NOTE — NC FL2 (Signed)
[place] LEVEL OF CARE SCREENING TOOL     IDENTIFICATION  Patient Name: Phillip Klein Birthdate: 16-Mar-1965 Sex: male Admission Date (Current Location): 04/17/2020  [facility] and [place] Number:  [facility] [employer] and Address:  The [place]. [facility], [place] [address] [place], [place] [address]      Provider Number: [id number]  Attending Physician Name and Address:  Rigoberto Noel, MD  Relative Name and Phone Number:       Current Level of Care: Hospital Recommended Level of Care: Vent SNF Prior Approval Number: Tripton Ned (wife)  [phone]  Date Approved/Denied:   PASRR Number: [id number]  Discharge Plan: Other (Comment) (Vent/ SNF)    Current Diagnoses: Patient Active Problem List   Diagnosis Date Noted  . Anemia   . Jejunostomy tube leak ([place])   . Pleural effusion   . Quadriplegia ([place]) 05/01/2020  . Gram-negative bacteremia 05/01/2020  . Infection due to ESBL-producing [name] pneumoniae 05/01/2020  . Chronic hepatitis C without hepatic coma ([place]) 05/01/2020  . Encounter for feeding tube placement   . Hypotension   . Pressure injury of skin 04/30/2020  . Malnutrition of moderate degree 04/30/2020  . Septic shock ([place]) 04/15/2020    Orientation RESPIRATION BLADDER Height & Weight     Self  Vent,Tracheostomy Indwelling catheter Weight: 110.1 kg Height:  _0  (188 cm)  BEHAVIORAL SYMPTOMS/MOOD NEUROLOGICAL BOWEL NUTRITION STATUS      Colostomy (LLQ with colostomy) Feeding tube (Refer to d/c summary)  AMBULATORY STATUS COMMUNICATION OF NEEDS Skin   Total Care Non-Verbally (trach/vent) PU Stage and Appropriate Care (Pt with multi wounds, please refer to [place] note)                       Personal Care Assistance Level of Assistance  Bathing,Feeding,Dressing,Total care Bathing Assistance: Maximum assistance Feeding assistance: Maximum assistance (Pt with PEG- Jejunostomy tube) Dressing Assistance: Maximum  assistance Total Care Assistance: Maximum assistance   Functional Limitations Info  Sight,Hearing,Speech Sight Info: Adequate Hearing Info: Adequate Speech Info: Impaired (trach/ vent.)    SPECIAL CARE FACTORS FREQUENCY  PT (By licensed PT),OT (By licensed OT)     PT Frequency: 5x / week, evaluate and treat OT Frequency: 5x / week, evaluate and treat            Contractures Contractures Info: Not present    Additional Factors Info  Code Status,Allergies Code Status Info: Full Code Allergies Info: Acetaminophen, Gabapentin           Current Medications (05/07/2020):  This is the current hospital active medication list Current Facility-Administered Medications  Medication Dose Route Frequency Provider Last Rate Last Admin  . 0.9 %  sodium chloride infusion  10 mL/hr Intravenous Once Emeline Darling, PA-C   Held at 04/23/2020 4158  . albumin human 25 % solution 50 g  50 g Intravenous Q0600 Corey Harold, NP 60 mL/hr at 05/06/20 2006 50 g at 05/06/20 2006  . chlorhexidine gluconate (MEDLINE KIT) (PERIDEX) 0.12 % solution 15 mL  15 mL Mouth Rinse BID Hunsucker, Bonna Gains, MD   15 mL at 05/07/20 0800  . Chlorhexidine Gluconate Cloth 2 % PADS 6 each  6 each Topical Daily Hunsucker, Bonna Gains, MD   6 each at 05/06/20 2301  . clonazepam (KLONOPIN) disintegrating tablet 0.5 mg  0.5 mg Per Tube BID PRN Simonne Maffucci B, MD   0.5 mg at 05/07/20 1100  . docusate (COLACE) 50 MG/5ML  liquid 100 mg  100 mg Per Tube BID PRN Simonne Maffucci B, MD      . feeding supplement (OSMOLITE 1.5 CAL) liquid 1,000 mL  1,000 mL Per Tube Continuous Juanito Doom, MD 60 mL/hr at 05/07/20 0922 1,000 mL at 05/07/20 0922  . feeding supplement (PROSource TF) liquid 90 mL  90 mL Per Tube TID Simonne Maffucci B, MD   90 mL at 05/07/20 0922  . fentaNYL (DURAGESIC) 75 MCG/HR 1 patch  1 patch Transdermal Q72H Frederik Pear, MD   1 patch at 05/06/20 0046   And  . fentaNYL (DURAGESIC) 75 MCG/HR 1  patch  1 patch Transdermal Q72H Frederik Pear, MD   1 patch at 05/06/20 0045  . fentaNYL (SUBLIMAZE) injection 25 mcg  25 mcg Intravenous Q3H PRN Corey Harold, NP   25 mcg at 05/07/20 6433  . fludrocortisone (FLORINEF) tablet 0.2 mg  0.2 mg Per Tube BID Simonne Maffucci B, MD   0.2 mg at 05/07/20 0927  . heparin injection 5,000 Units  5,000 Units Subcutaneous Q8H Juanito Doom, MD   5,000 Units at 05/07/20 0601  . insulin aspart (novoLOG) injection 0-9 Units  0-9 Units Subcutaneous Q4H Cristal Generous, NP   1 Units at 05/05/20 2011  . levothyroxine (SYNTHROID) tablet 125 mcg  125 mcg Per Tube [id number] Juanito Doom, MD   125 mcg at 05/07/20 0651  . MEDLINE mouth rinse  15 mL Mouth Rinse 10 times per day Hunsucker, Bonna Gains, MD   15 mL at 05/07/20 1000  . meropenem (MERREM) 1 g in sodium chloride 0.9 % 100 mL IVPB  1 g Intravenous Q12H Tommy Medal, Lavell Islam, MD 200 mL/hr at 05/07/20 0925 1 g at 05/07/20 8416  . multivitamin with minerals tablet 1 tablet  1 tablet Per Tube Daily Juanito Doom, MD   1 tablet at 05/07/20 1000  . nutrition supplement (JUVEN) (JUVEN) powder packet 1 packet  1 packet Per Tube BID BM Juanito Doom, MD   1 packet at 05/07/20 1000  . oxyCODONE (Oxy IR/ROXICODONE) immediate release tablet 5 mg  5 mg Per Tube Q6H PRN Juanito Doom, MD   5 mg at 05/07/20 0925  . pantoprazole sodium (PROTONIX) 40 mg/20 mL oral suspension 40 mg  40 mg Per Tube Daily Simonne Maffucci B, MD   40 mg at 05/07/20 1000  . polyethylene glycol (MIRALAX / GLYCOLAX) packet 17 g  17 g Per Tube Daily PRN Juanito Doom, MD   17 g at 05/07/20 6063  . prochlorperazine (COMPAZINE) injection 10 mg  10 mg Intravenous Q6H PRN Anders Simmonds, MD   10 mg at 05/07/20 0341  . sertraline (ZOLOFT) tablet 100 mg  100 mg Per Tube Daily Simonne Maffucci B, MD   100 mg at 05/07/20 0925  . sodium bicarbonate 150 mEq in sterile water 1,000 mL infusion   Intravenous Continuous Donato Heinz, MD 50 mL/hr at 05/07/20 0418 Infusion Verify at 05/07/20 0418  . sodium chloride flush (NS) 0.9 % injection 10-40 mL  10-40 mL Intracatheter Q12H Simonne Maffucci B, MD   10 mL at 05/07/20 1000  . sodium chloride flush (NS) 0.9 % injection 10-40 mL  10-40 mL Intracatheter PRN Simonne Maffucci B, MD      . traZODone (DESYREL) tablet 100 mg  100 mg Per Tube QHS Juanito Doom, MD   100 mg at 05/06/20 2204     Discharge Medications:  Please see discharge summary for a list of discharge medications.  Relevant Imaging Results:  Relevant Lab Results:   Additional Information SS# [id number]  Sharin Mons, RN

## 2020-05-07 NOTE — TOC Progression Note (Signed)
Transition of Care [facility]) - Progression Note    Patient Details  Name: Phillip Klein MRN: [id number] Date of Birth: 28-May-1965  Transition of Care [facility]) CM/SW Contact  Levada Schilling Phone Number: 05/07/2020, 2:12 PM  Clinical Narrative:    CSW left voice messages for the following SNF's for possible placement: [facility] & [facility]  CSW spoke with [facility] in [place].  Pt will need a 2nd insurance to cover days over the 20 days or private pay.  Pt can apply for medicaid.  CSW will fax clinicals to the possible SNf's.  TOC will continue to assist with disposition planning.   Expected Discharge Plan: Skilled Nursing Facility Barriers to Discharge: Continued Medical Work up  Expected Discharge Plan and Services Expected Discharge Plan: Skilled Nursing Facility                                               Social Determinants of Health (SDOH) Interventions    Readmission Risk Interventions No flowsheet data found.

## 2020-05-07 NOTE — Progress Notes (Signed)
Patient ID: Phillip Klein, male   DOB: [birth year], 55 y.o.   MRN: [id number] S: Good UOP overnight.  No new complaints O:BP (!) 141/82   Pulse 93   Temp 98.4 F (36.9 C) (Oral)   Resp 17   Ht _0  (1.88 m)   Wt 110.1 kg   SpO2 97%   BMI 31.16 kg/m   Intake/Output Summary (Last 24 hours) at 05/07/2020 0946 Last data filed at 05/07/2020 0500 Gross per 24 hour  Intake 1782.88 ml  Output 1275 ml  Net 507.88 ml   Intake/Output: I/O last 3 completed shifts: In: 3585.7 [I.V.:1713.1; NG/GT:1380; IV Piggyback:492.6] Out: 1750 [Urine:1450; Stool:300]  Intake/Output this shift:  No intake/output data recorded. Weight change: 3.1 kg Gen: NAD CVS: RRR Resp: CTa Abd: +BS, +PEG Ext:+ anasarca  Recent Labs  Lab 05/01/20 0535 05/02/20 1006 05/03/20 0141 05/04/20 0910 05/05/20 0210 05/06/20 0353 05/07/20 0730  NA 134* 134* 135 135 139 137 139  136  K 3.1* 3.2* 3.5 3.2* 3.6 3.9 3.9  3.8  CL 100 101 102 102 106 105 103  101  CO2 19* 18* 19* 18* 17* 17* 22  21*  GLUCOSE 108* 94 96 107* 132* 106* 117*  114*  BUN 70* 81* 86* 99* 108* 117* 136*  136*  CREATININE 2.87* 2.78* 2.80* 2.91* 2.77* 2.58* 2.62*  2.61*  ALBUMIN  --  1.7* 2.0*  --   --  2.3* 2.4*  2.4*  CALCIUM 8.2* 8.1* 8.3* 8.3* 8.5* 8.5* 8.7*  8.6*  PHOS  --   --   --   --   --  6.1* 6.0*  AST  --  14* 13*  --   --   --  22  ALT  --  9 8  --   --   --  12   Liver Function Tests: Recent Labs  Lab 05/02/20 1006 05/03/20 0141 05/06/20 0353 05/07/20 0730  AST 14* 13*  --  22  ALT 9 8  --  12  ALKPHOS 97 100  --  158*  BILITOT 0.5 0.9  --  0.8  PROT 6.1* 6.2*  --  6.3*  ALBUMIN 1.7* 2.0* 2.3* 2.4*  2.4*   No results for input(s): LIPASE, AMYLASE in the last 168 hours. No results for input(s): AMMONIA in the last 168 hours. CBC: Recent Labs  Lab 05/02/20 1006 05/03/20 0141 05/05/20 0436 05/06/20 0353 05/07/20 0730  WBC 6.2 6.4 7.5 7.8 7.2  NEUTROABS 5.1 4.4  --   --  4.1  HGB 7.7* 7.3* 7.6* 7.1*  7.0*  HCT 23.7* 22.1* 23.0* 22.9* 22.8*  MCV 86.8 86.3 88.8 90.2 89.4  PLT 157 113* 144* 158 148*   Cardiac Enzymes: Recent Labs  Lab 05/04/20 1701  CKTOTAL 8*   CBG: Recent Labs  Lab 05/06/20 1552 05/06/20 1937 05/06/20 2329 05/07/20 0330 05/07/20 0721  GLUCAP 109* 106* 103* 102* 110*    Iron Studies: No results for input(s): IRON, TIBC, TRANSFERRIN, FERRITIN in the last 72 hours. Studies/Results: No results found. . chlorhexidine gluconate (MEDLINE KIT)  15 mL Mouth Rinse BID  . Chlorhexidine Gluconate Cloth  6 each Topical Daily  . feeding supplement (PROSource TF)  90 mL Per Tube TID  . fentaNYL  1 patch Transdermal Q72H   And  . fentaNYL  1 patch Transdermal Q72H  . fludrocortisone  0.2 mg Per Tube BID  . heparin injection (subcutaneous)  5,000 Units Subcutaneous Q8H  . insulin aspart  0-9  Units Subcutaneous Q4H  . levothyroxine  125 mcg Per Tube Q0600  . mouth rinse  15 mL Mouth Rinse 10 times per day  . multivitamin with minerals  1 tablet Per Tube Daily  . nutrition supplement (JUVEN)  1 packet Per Tube BID BM  . pantoprazole sodium  40 mg Per Tube Daily  . sertraline  100 mg Per Tube Daily  . sodium chloride flush  10-40 mL Intracatheter Q12H  . traZODone  100 mg Per Tube QHS    BMET    Component Value Date/Time   NA 136 05/07/2020 0730   NA 139 05/07/2020 0730   K 3.8 05/07/2020 0730   K 3.9 05/07/2020 0730   CL 101 05/07/2020 0730   CL 103 05/07/2020 0730   CO2 21 (L) 05/07/2020 0730   CO2 22 05/07/2020 0730   GLUCOSE 114 (H) 05/07/2020 0730   GLUCOSE 117 (H) 05/07/2020 0730   BUN 136 (H) 05/07/2020 0730   BUN 136 (H) 05/07/2020 0730   CREATININE 2.61 (H) 05/07/2020 0730   CREATININE 2.62 (H) 05/07/2020 0730   CALCIUM 8.6 (L) 05/07/2020 0730   CALCIUM 8.7 (L) 05/07/2020 0730   GFRNONAA 29 (L) 05/07/2020 0730   GFRNONAA 29 (L) 05/07/2020 0730   CBC    Component Value Date/Time   WBC 7.2 05/07/2020 0730   RBC 2.55 (L) 05/07/2020 0730    HGB 7.0 (L) 05/07/2020 0730   HCT 22.8 (L) 05/07/2020 0730   PLT 148 (L) 05/07/2020 0730   MCV 89.4 05/07/2020 0730   MCH 27.5 05/07/2020 0730   MCHC 30.7 05/07/2020 0730   RDW 16.1 (H) 05/07/2020 0730   LYMPHSABS 1.6 05/07/2020 0730   MONOABS 0.4 05/07/2020 0730   EOSABS 1.0 (H) 05/07/2020 0730   BASOSABS 0.0 05/07/2020 0730    Assessment/Plan: 1. AKI - likely ischemic ATN in setting of septic shock requiring pressors. Was started on IV bicarb and is also on florinef and midodrine for chronic hypotension. BUN has been climbing and Scr has fluctuated 2.7-2.9 and now down to 2.58 with increase in UOP but remains oliguric. 1. No urgent indication for dialysis at this time but may require CRRT if he does not respond to IV bicarb.  2. Responded well to IV lasix 80 mg 05/06/20 but remains with significant anasarca. 3. Continue with IV albumin  4. Hold IV lasix for now given rising BUN 2. Sepsis due to klebsiella bacteremia - on meropenem and currently off pressors but requires midodrine and florinef for chronic hypotension. 3. Metabolic Acidosis - due to #1 continue with IV bicarb. 4. J tube drainage - improved.  5. Severe protein malnutrition - tube feeds restarted and agree with IV albumin 6. Acute on chronic diastolic CHF (LVEF 93-73% with grade II DD). Also has anasarca from hypoalbuminemia. Will try IV lasix today and follow UOP. 7. 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 On vent via trach 8. Multiple pressure ulcers/wounds - wound care 9. Chronic pain - on fentanyl patch and oxycodone.  Donetta Potts, MD [employer] [phone]

## 2020-05-08 ENCOUNTER — Inpatient Hospital Stay (HOSPITAL_COMMUNITY): Payer: Medicare Other

## 2020-05-08 DIAGNOSIS — A4159 Other Gram-negative sepsis: Secondary | ICD-10-CM | POA: Diagnosis not present

## 2020-05-08 DIAGNOSIS — A419 Sepsis, unspecified organism: Secondary | ICD-10-CM | POA: Diagnosis not present

## 2020-05-08 DIAGNOSIS — R6521 Severe sepsis with septic shock: Secondary | ICD-10-CM | POA: Diagnosis not present

## 2020-05-08 DIAGNOSIS — N179 Acute kidney failure, unspecified: Secondary | ICD-10-CM | POA: Diagnosis not present

## 2020-05-08 DIAGNOSIS — I959 Hypotension, unspecified: Secondary | ICD-10-CM | POA: Diagnosis not present

## 2020-05-08 LAB — CBC
HCT: 22.5 % — ABNORMAL LOW (ref 39.0–52.0)
Hemoglobin: 7 g/dL — ABNORMAL LOW (ref 13.0–17.0)
MCH: 27.9 pg (ref 26.0–34.0)
MCHC: 31.1 g/dL (ref 30.0–36.0)
MCV: 89.6 fL (ref 80.0–100.0)
Platelets: 160 10*3/uL (ref 150–400)
RBC: 2.51 MIL/uL — ABNORMAL LOW (ref 4.22–5.81)
RDW: 16.1 % — ABNORMAL HIGH (ref 11.5–15.5)
WBC: 6.6 10*3/uL (ref 4.0–10.5)
nRBC: 0 % (ref 0.0–0.2)

## 2020-05-08 LAB — GLUCOSE, CAPILLARY
Glucose-Capillary: 118 mg/dL — ABNORMAL HIGH (ref 70–99)
Glucose-Capillary: 110 mg/dL — ABNORMAL HIGH (ref 70–99)
Glucose-Capillary: 111 mg/dL — ABNORMAL HIGH (ref 70–99)
Glucose-Capillary: 137 mg/dL — ABNORMAL HIGH (ref 70–99)
Glucose-Capillary: 127 mg/dL — ABNORMAL HIGH (ref 70–99)

## 2020-05-08 LAB — COMPREHENSIVE METABOLIC PANEL
ALT: 16 U/L (ref 0–44)
AST: 36 U/L (ref 15–41)
Albumin: 2.5 g/dL — ABNORMAL LOW (ref 3.5–5.0)
Alkaline Phosphatase: 191 U/L — ABNORMAL HIGH (ref 38–126)
Anion gap: 15 (ref 5–15)
BUN: 130 mg/dL — ABNORMAL HIGH (ref 6–20)
CO2: 22 mmol/L (ref 22–32)
Calcium: 8.7 mg/dL — ABNORMAL LOW (ref 8.9–10.3)
Chloride: 100 mmol/L (ref 98–111)
Creatinine, Ser: 2.42 mg/dL — ABNORMAL HIGH (ref 0.61–1.24)
GFR, Estimated: 31 mL/min — ABNORMAL LOW (ref >60)
Glucose, Bld: 120 mg/dL — ABNORMAL HIGH (ref 70–99)
Potassium: 3.9 mmol/L (ref 3.5–5.1)
Sodium: 137 mmol/L (ref 135–145)
Total Bilirubin: 1 mg/dL (ref 0.3–1.2)
Total Protein: 6.1 g/dL — ABNORMAL LOW (ref 6.5–8.1)

## 2020-05-08 LAB — MAGNESIUM: Magnesium: 1.8 mg/dL (ref 1.7–2.4)

## 2020-05-08 LAB — PHOSPHORUS: Phosphorus: 6 mg/dL — ABNORMAL HIGH (ref 2.5–4.6)

## 2020-05-08 NOTE — Progress Notes (Addendum)
Patient ID: Phillip Klein, male   DOB: [birth year], 55 y.o.   MRN: [id number] S: No events overnight or new complaints. O:BP (!) 153/90   Pulse 80   Temp 98.5 F (36.9 C) (Oral)   Resp (!) 25   Ht _0  (1.88 m)   Wt 110.1 kg   SpO2 98%   BMI 31.16 kg/m   Intake/Output Summary (Last 24 hours) at 05/08/2020 0942 Last data filed at 05/08/2020 0600 Gross per 24 hour  Intake 2720.94 ml  Output 1625 ml  Net 1095.94 ml   Intake/Output: I/O last 3 completed shifts: In: 3414.8 [I.V.:1604.2; Other:600; NG/GT:900; IV Piggyback:310.5] Out: 5449 [Urine:1700; Stool:775]  Intake/Output this shift:  No intake/output data recorded. Weight change:  Gen: NAD on vent via trach CVS: RRR Resp: CTA Abd: +BS, soft, +PEG Ext: + anasarca.  Recent Labs  Lab 05/02/20 1006 05/03/20 0141 05/04/20 0910 05/05/20 0210 05/06/20 0353 05/07/20 0730 05/08/20 0611  NA 134* 135 135 139 137 139  136 137  K 3.2* 3.5 3.2* 3.6 3.9 3.9  3.8 3.9  CL 101 102 102 106 105 103  101 100  CO2 18* 19* 18* 17* 17* 22  21* 22  GLUCOSE 94 96 107* 132* 106* 117*  114* 120*  BUN 81* 86* 99* 108* 117* 136*  136* 130*  CREATININE 2.78* 2.80* 2.91* 2.77* 2.58* 2.62*  2.61* 2.42*  ALBUMIN 1.7* 2.0*  --   --  2.3* 2.4*  2.4* 2.5*  CALCIUM 8.1* 8.3* 8.3* 8.5* 8.5* 8.7*  8.6* 8.7*  PHOS  --   --   --   --  6.1* 6.0* 6.0*  AST 14* 13*  --   --   --  22 36  ALT 9 8  --   --   --  12 16   Liver Function Tests: Recent Labs  Lab 05/03/20 0141 05/06/20 0353 05/07/20 0730 05/08/20 0611  AST 13*  --  22 36  ALT 8  --  12 16  ALKPHOS 100  --  158* 191*  BILITOT 0.9  --  0.8 1.0  PROT 6.2*  --  6.3* 6.1*  ALBUMIN 2.0* 2.3* 2.4*  2.4* 2.5*   No results for input(s): LIPASE, AMYLASE in the last 168 hours. No results for input(s): AMMONIA in the last 168 hours. CBC: Recent Labs  Lab 05/02/20 1006 05/03/20 0141 05/05/20 0436 05/06/20 0353 05/07/20 0730 05/08/20 0611  WBC 6.2 6.4 7.5 7.8 7.2 6.6  NEUTROABS  5.1 4.4  --   --  4.1  --   HGB 7.7* 7.3* 7.6* 7.1* 7.0* 7.0*  HCT 23.7* 22.1* 23.0* 22.9* 22.8* 22.5*  MCV 86.8 86.3 88.8 90.2 89.4 89.6  PLT 157 113* 144* 158 148* 160   Cardiac Enzymes: Recent Labs  Lab 05/04/20 1701  CKTOTAL 8*   CBG: Recent Labs  Lab 05/07/20 1508 05/07/20 1954 05/07/20 2336 05/08/20 0343 05/08/20 0807  GLUCAP 110* 108* 114* 118* 110*    Iron Studies: No results for input(s): IRON, TIBC, TRANSFERRIN, FERRITIN in the last 72 hours. Studies/Results: DG Chest Port 1 View  Result Date: 05/08/2020 CLINICAL DATA:  Respiratory failure EXAM: PORTABLE CHEST 1 VIEW COMPARISON:  05/01/2020 FINDINGS: Tracheostomy is unchanged. Right internal jugular central venous catheter has been removed. Pulmonary insufflation is stable. Small bilateral pleural effusions persist with associated bibasilar compressive atelectasis. Mild cardiomegaly is stable. Trace interstitial pulmonary edema has developed. No pneumothorax. Implanted loop recorder and cervical fusion hardware again noted. IMPRESSION: Interval  removal of right internal jugular central venous catheter. Stable small bilateral pleural effusions. Interval development of mild pulmonary edema, likely cardiogenic in nature. Electronically Signed   By: Fidela Salisbury MD   On: 05/08/2020 07:12   . chlorhexidine gluconate (MEDLINE KIT)  15 mL Mouth Rinse BID  . Chlorhexidine Gluconate Cloth  6 each Topical Daily  . feeding supplement (PROSource TF)  90 mL Per Tube TID  . fentaNYL  1 patch Transdermal Q72H   And  . fentaNYL  1 patch Transdermal Q72H  . fludrocortisone  0.2 mg Per Tube BID  . heparin injection (subcutaneous)  5,000 Units Subcutaneous Q8H  . insulin aspart  0-9 Units Subcutaneous Q4H  . levothyroxine  125 mcg Per Tube Q0600  . mouth rinse  15 mL Mouth Rinse 10 times per day  . multivitamin with minerals  1 tablet Per Tube Daily  . nutrition supplement (JUVEN)  1 packet Per Tube BID BM  . pantoprazole sodium  40  mg Per Tube Daily  . sertraline  100 mg Per Tube Daily  . sodium chloride flush  10-40 mL Intracatheter Q12H  . traZODone  100 mg Per Tube QHS    BMET    Component Value Date/Time   NA 137 05/08/2020 0611   K 3.9 05/08/2020 0611   CL 100 05/08/2020 0611   CO2 22 05/08/2020 0611   GLUCOSE 120 (H) 05/08/2020 0611   BUN 130 (H) 05/08/2020 0611   CREATININE 2.42 (H) 05/08/2020 0611   CALCIUM 8.7 (L) 05/08/2020 0611   GFRNONAA 31 (L) 05/08/2020 0611   CBC    Component Value Date/Time   WBC 6.6 05/08/2020 0611   RBC 2.51 (L) 05/08/2020 0611   HGB 7.0 (L) 05/08/2020 0611   HCT 22.5 (L) 05/08/2020 0611   PLT 160 05/08/2020 0611   MCV 89.6 05/08/2020 0611   MCH 27.9 05/08/2020 0611   MCHC 31.1 05/08/2020 0611   RDW 16.1 (H) 05/08/2020 0611   LYMPHSABS 1.6 05/07/2020 0730   MONOABS 0.4 05/07/2020 0730   EOSABS 1.0 (H) 05/07/2020 0730   BASOSABS 0.0 05/07/2020 0730      Assessment/Plan: 1. AKI - likely ischemic ATN in setting of septic shock requiring pressors. Was started on IV bicarb and is also on florinef and midodrine for chronic hypotension. BUN has been climbing and Scr has fluctuated 2.7-2.9and now down to 2.58 with increase in UOP but remains oliguric. 1. No urgent indication for dialysis at this time but may require CRRT if he does not respond to IV bicarb. 2. Responded well to IV lasix 80 mg 05/06/20 but remains with significant anasarca. 3. Continue with IV albumin 4. Hold IV lasix for now given rising BUN 2. Sepsis due to klebsiella bacteremia - on meropenem and currently off pressors but requires midodrine and florinef for chronic hypotension. 3. Metabolic Acidosis - due to #1improved with IV bicarb.  Ok to stop and follow.  4. J tube drainage - improved.  5. Severe protein malnutrition - tube feeds restarted and agree with IV albumin 6. Acute on chronic diastolic CHF (LVEF 29-47% with grade II DD). Also has anasarca from hypoalbuminemia.Will try IV lasix  today and follow UOP. 7. 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 On vent via trach 8. Multiple pressure ulcers/wounds - wound care 9. Chronic pain - on fentanyl patch and oxycodone.   Donetta Potts, MD [employer] [phone]

## 2020-05-08 NOTE — Progress Notes (Signed)
Pt found on previous settings of PRVC 510, R20 40% +5. Last switched SIMV to PRVC 3/5 @11 :31. Pt. Resting comfortable, VSS, , RT will continue to monitor, switch back to SIMV if needed and confirm in AM.

## 2020-05-08 NOTE — Progress Notes (Signed)
NAME:  Phillip Klein, MRN:  [id number], DOB:  Jul 23, 1965, LOS: 9 ADMISSION DATE:  May 09, 2020, CONSULTATION DATE:  2/27 REFERRING MD:  Silverio Lay, CHIEF COMPLAINT:  Sepsis   Brief History:  55 yo male who presented from [facility]  Admitted 2/24 with septic shock, ESBL [name]  Hickman catheter removed  Oliguric AKI , baseline creatinine 1.8   Past Medical History:  Chronic hypoxic respiratory failure/ tstomy/vent since 2021 after c spine surgery Hx of MRSA GERD Type 2 diabetes Anemia of chronic illness Osteomyelitis of cervical spine  Quadriplegia Spinal epidural abscess C2-C7 and T9-L4 Malnutrition Neurogenic astrostatic hypotension  HFrEF Untreated HCV Previous IVDU Cirrhosis  Chronic pain  Significant Hospital Events:  Admitted 2/24  Consults:  CCM ID  Procedures:  Trach present prior to admission Hickman catheter prior to admission > removed 2/26  Significant Diagnostic Tests:    Micro Data:  2/24 blood > [name] ESBL 1/4 bottle 2/26 urine > multiple species   Antimicrobials:  2/24 cefepime> 2/25 2/24 vanc > 2/25  2/25 mero >    Interim History / Subjective:   Remains chronic, critically ill, on vent Afebrile Adequate urine output   Objective   Blood pressure (!) 148/85, pulse 80, temperature 98.5 F (36.9 C), temperature source Oral, resp. rate (!) 23, height 6\' 2"  (1.88 m), weight 110.1 kg, SpO2 95 %.    Vent Mode: PRVC FiO2 (%):  [40 %] 40 % Set Rate:  [26 bmp] 26 bmp Vt Set:  [510 mL] 510 mL PEEP:  [5 cmH20] 5 cmH20 Plateau Pressure:  [15 cmH20-21 cmH20] 21 cmH20   Intake/Output Summary (Last 24 hours) at 05/08/2020 1109 Last data filed at 05/08/2020 0800 Gross per 24 hour  Intake 2774.3 ml  Output 1475 ml  Net 1299.3 ml   Filed Weights   05/05/20 0500 05/06/20 0324 05/07/20 0500  Weight: 105.1 kg 107 kg 110.1 kg    Examination:  General: Chronically ill-appearing, in bed on vent, full mechanical support, NAD HENT: NCAT  tracheostomy in place and secure, minimal secretions PULM: Decreased breath sounds bilateral ventilated breath sounds, no accessory muscle use CV: RRR, no mgr, tachy per tele GI: BS+, soft, non tender, ND, BS +  MSK: no bulk or tone, foot drop, foot braces are on Neuro: Awake and alert, follows commands , communicates by mouthing words Anasarca +  Chest x-ray 3/5 independently reviewed, tracheostomy in position, bilateral lesions persist. Labs show normal electrolytes, stable BUN and slight drop in creatinine from 2.6-2.4, no leukocytosis, stable anemia  Resolved Hospital Problem list   Septic shock  Assessment & Plan:  [name] bacteremia from UTI vs line infection Hickman catheter removed Has pacemaker Continue meropenem for 10 days , repeat surveillance blood cultures today Would not place central line until his renal function has returned to normal Trend fever and WBC   AKI likely ATN in setting of septic shock Appreciate renal assistance,  Lasix on hold DC bicarb Monitor BMET and UOP  Jejunostomy tube drainage> improved Continue feeding through J tube At goal  Hypotension likely related to dysautonomia Continue florinef Stop midodrine  Chronic respiratory failure with hypoxemia Switch to SIMV/pressure support 10 mode with backup rate of 8 VAP prevention   Multiple pressure ulcers/wounds present on admission Wound care  Right heat failure Anasarca/swelling due to RHF and poor nutrition Careful with volume resusciation Monitor I&O  Chronic pain Drowsy 3/3 AM> suspect related to narcotics Continue fentanyl patch Reduce oxycodone to prn  Depression, insomnia Sertraline, trazodone  per home doses Continue clonazepam  GERD  Patroprazole  Anemia of chronic disease HGB drop to 7 from 7.1 Plan Transfuse for HGB < 7 No evidence of bleeding  Best practice (evaluated daily)  Diet: tube feeding Pain/Anxiety/Delirium protocol (if indicated): Fentanyl  patch VAP protocol (if indicated): yes DVT prophylaxis:  sub q heparin GI prophylaxis: n/a Glucose control: SSI Mobility: bed rest Disposition: Plan is for back to vent SNF when bed available  Goals of Care:  Last date of multidisciplinary goals of care discussion:3/3  Family and staff present: wife, bedside nurse, patient, [name] Summary of discussion: full scope of practice, "keep me alive at all costs" Follow up goals of care discussion due: 3/10   Code Status: full  Labs   CBC: Recent Labs  Lab 05/02/20 1006 05/03/20 0141 05/05/20 0436 05/06/20 0353 05/07/20 0730 05/08/20 0611  WBC 6.2 6.4 7.5 7.8 7.2 6.6  NEUTROABS 5.1 4.4  --   --  4.1  --   HGB 7.7* 7.3* 7.6* 7.1* 7.0* 7.0*  HCT 23.7* 22.1* 23.0* 22.9* 22.8* 22.5*  MCV 86.8 86.3 88.8 90.2 89.4 89.6  PLT 157 113* 144* 158 148* 160    Basic Metabolic Panel: Recent Labs  Lab 05/04/20 0910 05/05/20 0210 05/06/20 0353 05/07/20 0730 05/08/20 0611  NA 135 139 137 139  136 137  K 3.2* 3.6 3.9 3.9  3.8 3.9  CL 102 106 105 103  101 100  CO2 18* 17* 17* 22  21* 22  GLUCOSE 107* 132* 106* 117*  114* 120*  BUN 99* 108* 117* 136*  136* 130*  CREATININE 2.91* 2.77* 2.58* 2.62*  2.61* 2.42*  CALCIUM 8.3* 8.5* 8.5* 8.7*  8.6* 8.7*  MG  --  1.9  --   --  1.8  PHOS  --   --  6.1* 6.0* 6.0*   GFR: Estimated Creatinine Clearance: 47.2 mL/min (A) (by C-G formula based on SCr of 2.42 mg/dL (H)). Recent Labs  Lab 05/05/20 0436 05/06/20 0353 05/07/20 0730 05/08/20 0611  WBC 7.5 7.8 7.2 6.6    Liver Function Tests: Recent Labs  Lab 05/02/20 1006 05/03/20 0141 05/06/20 0353 05/07/20 0730 05/08/20 0611  AST 14* 13*  --  22 36  ALT 9 8  --  12 16  ALKPHOS 97 100  --  158* 191*  BILITOT 0.5 0.9  --  0.8 1.0  PROT 6.1* 6.2*  --  6.3* 6.1*  ALBUMIN 1.7* 2.0* 2.3* 2.4*  2.4* 2.5*   No results for input(s): LIPASE, AMYLASE in the last 168 hours. No results for input(s): AMMONIA in the last 168  hours.  ABG    Component Value Date/Time   HCO3 16.5 (L) 2020-05-23 1639   TCO2 18 (L) 2020/05/23 1640   ACIDBASEDEF 7.0 (H) 2020-05-23 1639   O2SAT 100.0 May 23, 2020 1639     Coagulation Profile: No results for input(s): INR, PROTIME in the last 168 hours.  Cardiac Enzymes: Recent Labs  Lab 05/04/20 1701  CKTOTAL 8*    HbA1C: Hgb A1c MFr Bld  Date/Time Value Ref Range Status  2020-05-23 10:00 PM 5.1 4.8 - 5.6 % Final    Comment:    (NOTE) Pre diabetes:          5.7%-6.4%  Diabetes:              >6.4%  Glycemic control for   <7.0% adults with diabetes     CBG: Recent Labs  Lab 05/07/20 1508 05/07/20 1954 05/07/20  2336 05/08/20 0343 05/08/20 0807  GLUCAP 110* 108* 114* 118* 110*     Cyril Mourning MD. FCCP. [facility] Pager : [phone]  If no response to pager , please call [phone] until 7 pm After 7:00 pm call Elink  [phone]     05/08/2020 11:09 AM

## 2020-05-08 NOTE — TOC Progression Note (Signed)
Transition of Care [facility]) - Progression Note    Patient Details  Name: Phillip Klein MRN: [id number] Date of Birth: Jan 25, 1966  [facility] CM/SW Contact  Carley Hammed, [place] Phone Number: 05/08/2020, 9:46 AM  Clinical Narrative:     CSW was notified by nursing that they would like to DC this pt today. At this time pt has no bed offers. VM left with pending facilities. Per previous TOC note, there is also an insurance barrier with this pt. CSW will continue to follow and provide updates if they become available.   Expected Discharge Plan: Skilled Nursing Facility Barriers to Discharge: Continued Medical Work up  Expected Discharge Plan and Services Expected Discharge Plan: Skilled Nursing Facility                                               Social Determinants of Health (SDOH) Interventions    Readmission Risk Interventions No flowsheet data found.

## 2020-05-09 DIAGNOSIS — J9611 Chronic respiratory failure with hypoxia: Secondary | ICD-10-CM

## 2020-05-09 DIAGNOSIS — A4159 Other Gram-negative sepsis: Secondary | ICD-10-CM | POA: Diagnosis not present

## 2020-05-09 DIAGNOSIS — L89214 Pressure ulcer of right hip, stage 4: Secondary | ICD-10-CM | POA: Diagnosis not present

## 2020-05-09 DIAGNOSIS — R6521 Severe sepsis with septic shock: Secondary | ICD-10-CM | POA: Diagnosis not present

## 2020-05-09 DIAGNOSIS — A419 Sepsis, unspecified organism: Secondary | ICD-10-CM | POA: Diagnosis not present

## 2020-05-09 DIAGNOSIS — L89894 Pressure ulcer of other site, stage 4: Secondary | ICD-10-CM | POA: Diagnosis not present

## 2020-05-09 DIAGNOSIS — I959 Hypotension, unspecified: Secondary | ICD-10-CM | POA: Diagnosis not present

## 2020-05-09 DIAGNOSIS — L89023 Pressure ulcer of left elbow, stage 3: Secondary | ICD-10-CM | POA: Diagnosis not present

## 2020-05-09 LAB — GLUCOSE, CAPILLARY
Glucose-Capillary: 108 mg/dL — ABNORMAL HIGH (ref 70–99)
Glucose-Capillary: 111 mg/dL — ABNORMAL HIGH (ref 70–99)
Glucose-Capillary: 112 mg/dL — ABNORMAL HIGH (ref 70–99)
Glucose-Capillary: 117 mg/dL — ABNORMAL HIGH (ref 70–99)
Glucose-Capillary: 118 mg/dL — ABNORMAL HIGH (ref 70–99)
Glucose-Capillary: 123 mg/dL — ABNORMAL HIGH (ref 70–99)
Glucose-Capillary: 124 mg/dL — ABNORMAL HIGH (ref 70–99)
Glucose-Capillary: 69 mg/dL — ABNORMAL LOW (ref 70–99)

## 2020-05-09 LAB — RENAL FUNCTION PANEL
Albumin: 2.1 g/dL — ABNORMAL LOW (ref 3.5–5.0)
Anion gap: 12 (ref 5–15)
BUN: 128 mg/dL — ABNORMAL HIGH (ref 6–20)
CO2: 24 mmol/L (ref 22–32)
Calcium: 8.6 mg/dL — ABNORMAL LOW (ref 8.9–10.3)
Chloride: 101 mmol/L (ref 98–111)
Creatinine, Ser: 2.39 mg/dL — ABNORMAL HIGH (ref 0.61–1.24)
GFR, Estimated: 32 mL/min — ABNORMAL LOW (ref >60)
Glucose, Bld: 124 mg/dL — ABNORMAL HIGH (ref 70–99)
Phosphorus: 6.4 mg/dL — ABNORMAL HIGH (ref 2.5–4.6)
Potassium: 4.1 mmol/L (ref 3.5–5.1)
Sodium: 137 mmol/L (ref 135–145)

## 2020-05-09 MED ORDER — ONDANSETRON HCL 4 MG/2ML IJ SOLN
4.0000 mg | Freq: Once | INTRAMUSCULAR | Status: AC
  Administered 2020-05-09: 4 mg via INTRAVENOUS
  Filled 2020-05-09: qty 2

## 2020-05-09 MED ORDER — ALBUMIN HUMAN 25 % IV SOLN
12.5000 g | Freq: Once | INTRAVENOUS | Status: AC
  Administered 2020-05-09: 12.5 g via INTRAVENOUS
  Filled 2020-05-09: qty 50

## 2020-05-09 MED ORDER — FUROSEMIDE 10 MG/ML IJ SOLN
80.0000 mg | Freq: Once | INTRAMUSCULAR | Status: AC
  Administered 2020-05-09: 80 mg via INTRAVENOUS
  Filled 2020-05-09: qty 8

## 2020-05-09 NOTE — Progress Notes (Signed)
NAME:  Phillip Klein, MRN:  [id number], DOB:  1965/08/08, LOS: 10 ADMISSION DATE:  05-09-20, CONSULTATION DATE:  2/27 REFERRING MD:  Silverio Lay, CHIEF COMPLAINT:  Sepsis   Brief History:  55 yo male who presented from [facility]  Admitted 2/24 with septic shock, ESBL [name]  Hickman catheter removed  Oliguric AKI , baseline creatinine 1.8   Past Medical History:  Chronic hypoxic respiratory failure/ tstomy/vent since 2021 after c spine surgery Hx of MRSA GERD Type 2 diabetes Anemia of chronic illness Osteomyelitis of cervical spine  Quadriplegia Spinal epidural abscess C2-C7 and T9-L4 Malnutrition Neurogenic astrostatic hypotension  HFrEF Untreated HCV Previous IVDU Cirrhosis  Chronic pain  Significant Hospital Events:  Admitted 2/24 3/2 albumin 3/3 lasix 3/5 trial of SIMV/PS -he did not like  Consults:  CCM ID renal 3/2  Procedures:  Trach present prior to admission Hickman catheter prior to admission > removed 2/26  Significant Diagnostic Tests:    Micro Data:  2/24 blood > [name] ESBL 1/4 bottle 2/26 urine > multiple species   Antimicrobials:  2/24 cefepime> 2/25 2/24 vanc > 2/25  2/25 mero >    Interim History / Subjective:   Chronically ill-appearing, on vent Denies pain or dyspnea Back on SIMV Urine output poor, 700 cc / 24 hours   Objective   Blood pressure 119/81, pulse 80, temperature 97.6 F (36.4 C), temperature source Axillary, resp. rate 16, height 6\' 2"  (1.88 m), weight 112.5 kg, SpO2 97 %.    Vent Mode: SIMV;PRVC;PSV FiO2 (%):  [40 %-60 %] 60 % Set Rate:  [8 bmp-20 bmp] 8 bmp Vt Set:  [510 mL-650 mL] 650 mL PEEP:  [5 cmH20] 5 cmH20 Pressure Support:  [10 cmH20] 10 cmH20 Plateau Pressure:  [16 cmH20-18 cmH20] 16 cmH20   Intake/Output Summary (Last 24 hours) at 05/09/2020 1037 Last data filed at 05/09/2020 1000 Gross per 24 hour  Intake 1879.87 ml  Output 995 ml  Net 884.87 ml   Filed Weights   05/06/20 0324  05/07/20 0500 05/09/20 0500  Weight: 107 kg 110.1 kg 112.5 kg    Examination:  General: Chronically ill-appearing, in bed on vent, full mechanical support, NAD HENT: NCAT tracheostomy in place and secure, minimal secretions PULM: No accessory muscle use, decreased breath sounds bilateral CV: RRR, no mgr, tachy per tele GI: BS+, soft, non tender, ND, BS +  MSK: no bulk or tone, foot drop, foot braces are on Neuro: Able to mouth words, awake and interactive Anasarca ++  Chest x-ray 3/5 independently reviewed, tracheostomy in position, bilateral effusions and edema   Labs show normal electrolytes, stable BUN and creatinine  Resolved Hospital Problem list   Septic shock  Assessment & Plan:  [name] bacteremia from UTI vs line infection Hickman catheter removed Has pacemaker Continue meropenem for 10 days until 3/6, can DC if repeat blood cultures negative If no other central access required can place PICC line prior to discharge    AKI likely ATN in setting of septic shock Appreciate renal assistance,  Trying albumin and Lasix again Monitor BMET and UOP  Jejunostomy tube drainage> improved Continue feeding through J tube At goal  Hypotension likely related to dysautonomia Continue florinef Midodrine has been stopped  Chronic respiratory failure with hypoxemia Attempting SIMV/pressure support while in the hospital under observation, if not tolerated can go back to assist control VAP prevention   Multiple pressure ulcers/wounds present on admission Wound care  Right heat failure Anasarca/swelling due to RHF and poor nutrition  Careful with volume resusciation Monitor I&O  Chronic pain Drowsy 3/3 AM> suspect related to narcotics Continue fentanyl patch Reduce oxycodone to prn  Depression, insomnia Sertraline, trazodone per home doses Continue clonazepam  GERD  Patroprazole  Anemia of chronic disease HGB drop to 7 from 7.1 Plan Transfuse for HGB <  7 No evidence of bleeding  Wife updated at bedside 3/5  Best practice (evaluated daily)  Diet: tube feeding Pain/Anxiety/Delirium protocol (if indicated): Fentanyl patch VAP protocol (if indicated): yes DVT prophylaxis:  sub q heparin GI prophylaxis: n/a Glucose control: SSI Mobility: bed rest Disposition: Plan is for back to vent SNF when bed available  Goals of Care:  Last date of multidisciplinary goals of care discussion:3/3  Family and staff present: wife, bedside nurse, patient, [name] Summary of discussion: full scope of practice, "keep me alive at all costs" Follow up goals of care discussion due: 3/10   Code Status: full  Labs   CBC: Recent Labs  Lab 05/03/20 0141 05/05/20 0436 05/06/20 0353 05/07/20 0730 05/08/20 0611  WBC 6.4 7.5 7.8 7.2 6.6  NEUTROABS 4.4  --   --  4.1  --   HGB 7.3* 7.6* 7.1* 7.0* 7.0*  HCT 22.1* 23.0* 22.9* 22.8* 22.5*  MCV 86.3 88.8 90.2 89.4 89.6  PLT 113* 144* 158 148* 160    Basic Metabolic Panel: Recent Labs  Lab 05/05/20 0210 05/06/20 0353 05/07/20 0730 05/08/20 0611 05/09/20 0151  NA 139 137 139  136 137 137  K 3.6 3.9 3.9  3.8 3.9 4.1  CL 106 105 103  101 100 101  CO2 17* 17* 22  21* 22 24  GLUCOSE 132* 106* 117*  114* 120* 124*  BUN 108* 117* 136*  136* 130* 128*  CREATININE 2.77* 2.58* 2.62*  2.61* 2.42* 2.39*  CALCIUM 8.5* 8.5* 8.7*  8.6* 8.7* 8.6*  MG 1.9  --   --  1.8  --   PHOS  --  6.1* 6.0* 6.0* 6.4*   GFR: Estimated Creatinine Clearance: 48.2 mL/min (A) (by C-G formula based on SCr of 2.39 mg/dL (H)). Recent Labs  Lab 05/05/20 0436 05/06/20 0353 05/07/20 0730 05/08/20 0611  WBC 7.5 7.8 7.2 6.6    Liver Function Tests: Recent Labs  Lab 05/03/20 0141 05/06/20 0353 05/07/20 0730 05/08/20 0611 05/09/20 0151  AST 13*  --  22 36  --   ALT 8  --  12 16  --   ALKPHOS 100  --  158* 191*  --   BILITOT 0.9  --  0.8 1.0  --   PROT 6.2*  --  6.3* 6.1*  --   ALBUMIN 2.0* 2.3* 2.4*  2.4* 2.5*  2.1*   No results for input(s): LIPASE, AMYLASE in the last 168 hours. No results for input(s): AMMONIA in the last 168 hours.  ABG    Component Value Date/Time   HCO3 16.5 (L) 05/04/2020 1639   TCO2 18 (L) 2020/05/04 1640   ACIDBASEDEF 7.0 (H) 04-May-2020 1639   O2SAT 100.0 05/04/20 1639     Coagulation Profile: No results for input(s): INR, PROTIME in the last 168 hours.  Cardiac Enzymes: Recent Labs  Lab 05/04/20 1701  CKTOTAL 8*    HbA1C: Hgb A1c MFr Bld  Date/Time Value Ref Range Status  05-04-2020 10:00 PM 5.1 4.8 - 5.6 % Final    Comment:    (NOTE) Pre diabetes:          5.7%-6.4%  Diabetes:              >  6.4%  Glycemic control for   <7.0% adults with diabetes     CBG: Recent Labs  Lab 05/08/20 1601 05/08/20 2038 05/09/20 0018 05/09/20 0446 05/09/20 0759  GLUCAP 137* 127* 111* 108* 117*     Cyril Mourning MD. FCCP. [facility] Pager : [phone]  If no response to pager , please call [phone] until 7 pm After 7:00 pm call Elink  [phone]     05/09/2020 10:37 AM

## 2020-05-09 NOTE — Progress Notes (Signed)
eLink Physician-Brief Progress Note Patient Name: Phillip Klein DOB: 12-17-1965 MRN: [id number]   Date of Service  05/09/2020  HPI/Events of Note  RN requests 1x dose of Zofran for patient's nausea.   eICU Interventions  Zofran 4mg  IV ordered.     Intervention Category Intermediate Interventions: Other:  05/09/2020, 10:26 PM

## 2020-05-09 NOTE — Progress Notes (Signed)
Patient ID: Phillip Klein, male   DOB: 10/17/1965, 55 y.o.   MRN: [id number] S: No events overnight and no complaints this morning. O:BP 125/85   Pulse 80   Temp 97.6 F (36.4 C) (Axillary)   Resp 11   Ht _0  (1.88 m)   Wt 112.5 kg   SpO2 97%   BMI 31.84 kg/m   Intake/Output Summary (Last 24 hours) at 05/09/2020 0926 Last data filed at 05/09/2020 0800 Gross per 24 hour  Intake 1759.87 ml  Output 925 ml  Net 834.87 ml   Intake/Output: I/O last 3 completed shifts: In: 2624.8 [I.V.:356.7; NG/GT:1620; IV Piggyback:648.1] Out: 6734 [Urine:1205; Stool:350]  Intake/Output this shift:  Total I/O In: 600 [NG/GT:600] Out: 70 [Urine:70] Weight change:  Gen: On vent via trach in NAD CVS: RRR Resp: decreased BS at bases Abd: +BS, soft, NT, +PEG Ext: 2+ anasarca  Recent Labs  Lab 05/02/20 1006 05/03/20 0141 05/04/20 0910 05/05/20 0210 05/06/20 0353 05/07/20 0730 05/08/20 0611 05/09/20 0151  NA 134* 135 135 139 137 139  136 137 137  K 3.2* 3.5 3.2* 3.6 3.9 3.9  3.8 3.9 4.1  CL 101 102 102 106 105 103  101 100 101  CO2 18* 19* 18* 17* 17* 22  21* 22 24  GLUCOSE 94 96 107* 132* 106* 117*  114* 120* 124*  BUN 81* 86* 99* 108* 117* 136*  136* 130* 128*  CREATININE 2.78* 2.80* 2.91* 2.77* 2.58* 2.62*  2.61* 2.42* 2.39*  ALBUMIN 1.7* 2.0*  --   --  2.3* 2.4*  2.4* 2.5* 2.1*  CALCIUM 8.1* 8.3* 8.3* 8.5* 8.5* 8.7*  8.6* 8.7* 8.6*  PHOS  --   --   --   --  6.1* 6.0* 6.0* 6.4*  AST 14* 13*  --   --   --  22 36  --   ALT 9 8  --   --   --  12 16  --    Liver Function Tests: Recent Labs  Lab 05/03/20 0141 05/06/20 0353 05/07/20 0730 05/08/20 0611 05/09/20 0151  AST 13*  --  22 36  --   ALT 8  --  12 16  --   ALKPHOS 100  --  158* 191*  --   BILITOT 0.9  --  0.8 1.0  --   PROT 6.2*  --  6.3* 6.1*  --   ALBUMIN 2.0*   < > 2.4*  2.4* 2.5* 2.1*   < > = values in this interval not displayed.   No results for input(s): LIPASE, AMYLASE in the last 168 hours. No  results for input(s): AMMONIA in the last 168 hours. CBC: Recent Labs  Lab 05/02/20 1006 05/03/20 0141 05/05/20 0436 05/06/20 0353 05/07/20 0730 05/08/20 0611  WBC 6.2 6.4 7.5 7.8 7.2 6.6  NEUTROABS 5.1 4.4  --   --  4.1  --   HGB 7.7* 7.3* 7.6* 7.1* 7.0* 7.0*  HCT 23.7* 22.1* 23.0* 22.9* 22.8* 22.5*  MCV 86.8 86.3 88.8 90.2 89.4 89.6  PLT 157 113* 144* 158 148* 160   Cardiac Enzymes: Recent Labs  Lab 05/04/20 1701  CKTOTAL 8*   CBG: Recent Labs  Lab 05/08/20 1601 05/08/20 2038 05/09/20 0018 05/09/20 0446 05/09/20 0759  GLUCAP 137* 127* 111* 108* 117*    Iron Studies: No results for input(s): IRON, TIBC, TRANSFERRIN, FERRITIN in the last 72 hours. Studies/Results: DG Chest Port 1 View  Result Date: 05/08/2020 CLINICAL DATA:  Respiratory failure  EXAM: PORTABLE CHEST 1 VIEW COMPARISON:  05/01/2020 FINDINGS: Tracheostomy is unchanged. Right internal jugular central venous catheter has been removed. Pulmonary insufflation is stable. Small bilateral pleural effusions persist with associated bibasilar compressive atelectasis. Mild cardiomegaly is stable. Trace interstitial pulmonary edema has developed. No pneumothorax. Implanted loop recorder and cervical fusion hardware again noted. IMPRESSION: Interval removal of right internal jugular central venous catheter. Stable small bilateral pleural effusions. Interval development of mild pulmonary edema, likely cardiogenic in nature. Electronically Signed   By: Fidela Salisbury MD   On: 05/08/2020 07:12   . chlorhexidine gluconate (MEDLINE KIT)  15 mL Mouth Rinse BID  . Chlorhexidine Gluconate Cloth  6 each Topical Daily  . feeding supplement (PROSource TF)  90 mL Per Tube TID  . fentaNYL  1 patch Transdermal Q72H   And  . fentaNYL  1 patch Transdermal Q72H  . fludrocortisone  0.2 mg Per Tube BID  . heparin injection (subcutaneous)  5,000 Units Subcutaneous Q8H  . insulin aspart  0-9 Units Subcutaneous Q4H  . levothyroxine  125 mcg  Per Tube Q0600  . mouth rinse  15 mL Mouth Rinse 10 times per day  . multivitamin with minerals  1 tablet Per Tube Daily  . nutrition supplement (JUVEN)  1 packet Per Tube BID BM  . pantoprazole sodium  40 mg Per Tube Daily  . sertraline  100 mg Per Tube Daily  . sodium chloride flush  10-40 mL Intracatheter Q12H  . traZODone  100 mg Per Tube QHS    BMET    Component Value Date/Time   NA 137 05/09/2020 0151   K 4.1 05/09/2020 0151   CL 101 05/09/2020 0151   CO2 24 05/09/2020 0151   GLUCOSE 124 (H) 05/09/2020 0151   BUN 128 (H) 05/09/2020 0151   CREATININE 2.39 (H) 05/09/2020 0151   CALCIUM 8.6 (L) 05/09/2020 0151   GFRNONAA 32 (L) 05/09/2020 0151   CBC    Component Value Date/Time   WBC 6.6 05/08/2020 0611   RBC 2.51 (L) 05/08/2020 0611   HGB 7.0 (L) 05/08/2020 0611   HCT 22.5 (L) 05/08/2020 0611   PLT 160 05/08/2020 0611   MCV 89.6 05/08/2020 0611   MCH 27.9 05/08/2020 0611   MCHC 31.1 05/08/2020 0611   RDW 16.1 (H) 05/08/2020 0611   LYMPHSABS 1.6 05/07/2020 0730   MONOABS 0.4 05/07/2020 0730   EOSABS 1.0 (H) 05/07/2020 0730   BASOSABS 0.0 05/07/2020 0730    Assessment/Plan: 1. AKI - likely ischemic ATN in setting of septic shock requiring pressors. Was started on IV bicarb and is also on florinef and midodrine for chronic hypotension. BUN has been climbing and Scr has fluctuated 2.7-2.9and now down to 2.58 with increase in UOP but remains oliguric. 1. No urgent indication for dialysis at this time. 2. Responded well toIV lasix80 mg 3/3/22but remains with significant anasarca. 3. Plan for IV albumin followed by IV lasix 80 mg again today and follow UOP and BUN/Cr. 4. Will need Nephrology follow up at [place] or [facility] once bed available for transfer. 2. Sepsis due to klebsiella bacteremia - on meropenem and currently off pressors but requires midodrine and florinef for chronic hypotension. 3. Metabolic Acidosis - due to #1improved with IV bicarb.  Ok to stop  and follow.  4. J tube drainage - improved.  5. Severe protein malnutrition - tube feeds restarted  6. Acute on chronic diastolic CHF (LVEF 41-58% with grade II DD). Also has anasarca from hypoalbuminemia.Will try  IV lasix today and follow UOP. 7. 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 On vent via trach 8. Multiple pressure ulcers/wounds - wound care 9. Chronic pain - on fentanyl patch and oxycodone. 10. Disposition - awaiting transfer back to [facility] or [facility] pending bed availability.    Donetta Potts, MD [employer] [phone]

## 2020-05-10 DIAGNOSIS — N179 Acute kidney failure, unspecified: Secondary | ICD-10-CM | POA: Diagnosis not present

## 2020-05-10 DIAGNOSIS — I959 Hypotension, unspecified: Secondary | ICD-10-CM | POA: Diagnosis not present

## 2020-05-10 DIAGNOSIS — A4159 Other Gram-negative sepsis: Secondary | ICD-10-CM | POA: Diagnosis not present

## 2020-05-10 DIAGNOSIS — R7881 Bacteremia: Secondary | ICD-10-CM | POA: Diagnosis not present

## 2020-05-10 DIAGNOSIS — A419 Sepsis, unspecified organism: Secondary | ICD-10-CM | POA: Diagnosis not present

## 2020-05-10 DIAGNOSIS — R6521 Severe sepsis with septic shock: Secondary | ICD-10-CM | POA: Diagnosis not present

## 2020-05-10 LAB — PREPARE RBC (CROSSMATCH)

## 2020-05-10 LAB — CBC
HCT: 22.3 % — ABNORMAL LOW (ref 39.0–52.0)
Hemoglobin: 6.8 g/dL — CL (ref 13.0–17.0)
MCH: 28.5 pg (ref 26.0–34.0)
MCHC: 30.5 g/dL (ref 30.0–36.0)
MCV: 93.3 fL (ref 80.0–100.0)
Platelets: 147 10*3/uL — ABNORMAL LOW (ref 150–400)
RBC: 2.39 MIL/uL — ABNORMAL LOW (ref 4.22–5.81)
RDW: 16.6 % — ABNORMAL HIGH (ref 11.5–15.5)
WBC: 6.4 10*3/uL (ref 4.0–10.5)
nRBC: 0 % (ref 0.0–0.2)

## 2020-05-10 LAB — RENAL FUNCTION PANEL
Albumin: 2.1 g/dL — ABNORMAL LOW (ref 3.5–5.0)
Anion gap: 14 (ref 5–15)
BUN: 141 mg/dL — ABNORMAL HIGH (ref 6–20)
CO2: 22 mmol/L (ref 22–32)
Calcium: 8.6 mg/dL — ABNORMAL LOW (ref 8.9–10.3)
Chloride: 101 mmol/L (ref 98–111)
Creatinine, Ser: 2.39 mg/dL — ABNORMAL HIGH (ref 0.61–1.24)
GFR, Estimated: 32 mL/min — ABNORMAL LOW (ref >60)
Glucose, Bld: 122 mg/dL — ABNORMAL HIGH (ref 70–99)
Phosphorus: 7.4 mg/dL — ABNORMAL HIGH (ref 2.5–4.6)
Potassium: 4.4 mmol/L (ref 3.5–5.1)
Sodium: 137 mmol/L (ref 135–145)

## 2020-05-10 LAB — GLUCOSE, CAPILLARY
Glucose-Capillary: 116 mg/dL — ABNORMAL HIGH (ref 70–99)
Glucose-Capillary: 122 mg/dL — ABNORMAL HIGH (ref 70–99)
Glucose-Capillary: 113 mg/dL — ABNORMAL HIGH (ref 70–99)
Glucose-Capillary: 123 mg/dL — ABNORMAL HIGH (ref 70–99)
Glucose-Capillary: 138 mg/dL — ABNORMAL HIGH (ref 70–99)

## 2020-05-10 LAB — HEMOGLOBIN AND HEMATOCRIT, BLOOD
HCT: 21.9 % — ABNORMAL LOW (ref 39.0–52.0)
HCT: 22.3 % — ABNORMAL LOW (ref 39.0–52.0)
Hemoglobin: 6.9 g/dL — CL (ref 13.0–17.0)
Hemoglobin: 6.8 g/dL — CL (ref 13.0–17.0)

## 2020-05-10 LAB — MAGNESIUM: Magnesium: 1.8 mg/dL (ref 1.7–2.4)

## 2020-05-10 LAB — PHOSPHORUS: Phosphorus: 7.3 mg/dL — ABNORMAL HIGH (ref 2.5–4.6)

## 2020-05-10 MED ORDER — DARBEPOETIN ALFA 60 MCG/0.3ML IJ SOSY
60.0000 ug | PREFILLED_SYRINGE | INTRAMUSCULAR | Status: DC
  Administered 2020-05-10 – 2020-05-17 (×2): 60 ug via SUBCUTANEOUS
  Filled 2020-05-10 (×2): qty 0.3

## 2020-05-10 MED ORDER — FUROSEMIDE 10 MG/ML IJ SOLN
120.0000 mg | Freq: Two times a day (BID) | INTRAVENOUS | Status: AC
  Administered 2020-05-10 – 2020-05-11 (×4): 120 mg via INTRAVENOUS
  Filled 2020-05-10 (×2): qty 10
  Filled 2020-05-10: qty 12
  Filled 2020-05-10: qty 10

## 2020-05-10 MED ORDER — SODIUM CHLORIDE 0.9% IV SOLUTION: Freq: Once | INTRAVENOUS | Status: AC

## 2020-05-10 NOTE — Progress Notes (Signed)
eLink Physician-Brief Progress Note Patient Name: Phillip Klein DOB: 01-27-1966 MRN: [id number]   Date of Service  05/10/2020  HPI/Events of Note  Hgb < 7.0  eICU Interventions  Ordered 1u pRBCs for transfusion.     Intervention Category Intermediate Interventions: Other:  Janae Bridgeman 05/10/2020, 1:46 AM

## 2020-05-10 NOTE — Progress Notes (Signed)
Patient states he needs more oxygen.  RT contacted Dr. Vassie Loll and then switched the vent from SIMV to PRVC 650, RR 18, 50%, +8 due to air hunger.  Patient appears to be more comfortable at this time. RN notified.  Vent order changed.

## 2020-05-10 NOTE — Progress Notes (Signed)
NAME:  Phillip Klein, MRN:  [id number], DOB:  06-May-1965, LOS: 11 ADMISSION DATE:  18-May-2020, CONSULTATION DATE:  2/27 REFERRING MD:  Silverio Lay, CHIEF COMPLAINT:  Sepsis   Brief History:  55 yo male who presented from [facility]  Admitted 2/24 with septic shock, ESBL [name]  Hickman catheter removed  Oliguric AKI , baseline creatinine 1.8   Past Medical History:  Chronic hypoxic respiratory failure/ tstomy/vent since 2021 after c spine surgery Hx of MRSA GERD Type 2 diabetes Anemia of chronic illness Osteomyelitis of cervical spine  Quadriplegia Spinal epidural abscess C2-C7 and T9-L4 Malnutrition Neurogenic astrostatic hypotension  HFrEF Untreated HCV Previous IVDU Cirrhosis  Chronic pain  Significant Hospital Events:  Admitted 2/24 3/2 albumin 3/3 lasix 3/5 trial of SIMV/PS   Consults:  CCM ID renal 3/2  Procedures:  Trach present prior to admission Hickman catheter prior to admission > removed 2/26  Significant Diagnostic Tests:    Micro Data:  2/24 blood > [name] ESBL 1/4 bottle 2/26 urine > multiple species   Antimicrobials:  2/24 cefepime> 2/25 2/24 vanc > 2/25  2/25 mero >  Plan until 3/9  Interim History / Subjective:   Afebrile Chronically ill-appearing on vent Urine output 770 cc last 24 hours Transfused 1 unit PRBC for hemoglobin 6.8   Objective   Blood pressure 119/75, pulse 80, temperature 98 F (36.7 C), temperature source Axillary, resp. rate 12, height 6\' 2"  (1.88 m), weight 118 kg, SpO2 92 %.    Vent Mode: SIMV;PRVC;PSV FiO2 (%):  [50 %] 50 % Set Rate:  [8 bmp] 8 bmp Vt Set:  [650 mL] 650 mL PEEP:  [5 cmH20-8 cmH20] 8 cmH20 Pressure Support:  [10 cmH20] 10 cmH20 Plateau Pressure:  [16 cmH20-22 cmH20] 16 cmH20   Intake/Output Summary (Last 24 hours) at 05/10/2020 0950 Last data filed at 05/10/2020 0900 Gross per 24 hour  Intake 2972.43 ml  Output 1240 ml  Net 1732.43 ml   Filed Weights   05/07/20 0500 05/09/20  0500 05/10/20 0500  Weight: 110.1 kg 112.5 kg 118 kg    Examination:  General: Chronically ill-appearing, in bed on vent, full mechanical support, NAD HENT: NCAT tracheostomy in place and secure, minimal secretions PULM: Decreased breath sounds bilateral, no accessory muscle use CV: RRR, no mgr, tachy per tele GI: Soft, nontender  MSK: no bulk or tone, foot drop, foot braces are on Neuro: Able to mouth words, awake and interactive Anasarca ++  Chest x-ray 3/5 independently reviewed, tracheostomy in position, bilateral effusions and edema   Labs show normal electrolytes, high BUN, creatinine stable, hemoglobin dropped to 6.8  Resolved Hospital Problem list   Septic shock  Assessment & Plan:  [name] bacteremia from UTI vs line infection Hickman catheter removed Has pacemaker Continue meropenem for 10 days from when lines were DC'd until 3/9, repeat blood cultures negative   AKI likely ATN in setting of septic shock Anasarca Appreciate renal assistance, trials of albumin plus Lasix not very effective Trying Lasix 120 every 12 Monitor BMET and UOP  Jejunostomy tube drainage> improved Continue feeding through J tube At goal  Hypotension likely related to dysautonomia dc florinef, probably contributing to water retention Midodrine has been stopped  Chronic respiratory failure with hypoxemia Attempting SIMV/pressure support while in the hospital under observation, if not tolerated can go back to assist control VAP prevention   Multiple pressure ulcers/wounds present on admission Wound care   Chronic pain Continue fentanyl patch Reduced oxycodone to prn  Depression, insomnia  Sertraline, trazodone per home doses Continue clonazepam  GERD  Patroprazole  Anemia of chronic disease HGB drop to 6.8 from 7.1 Plan Transfuse for HGB < 7, 1 U given No evidence of bleeding  Wife updated at bedside 3/5, have asked [facility] to assume care, social work to continue  attempts to get vent SNF bed  Best practice (evaluated daily)  Diet: tube feeding Pain/Anxiety/Delirium protocol (if indicated): Fentanyl patch VAP protocol (if indicated): yes DVT prophylaxis:  sub q heparin GI prophylaxis: n/a Glucose control: SSI Mobility: bed rest Disposition: Plan is for back to vent SNF when bed available  Goals of Care:  Last date of multidisciplinary goals of care discussion:3/3  Family and staff present: wife, bedside nurse, patient, [name] Summary of discussion: "keep me alive at all costs" Follow up goals of care discussion due: 3/10   Code Status: full  Labs   CBC: Recent Labs  Lab 05/05/20 0436 05/06/20 0353 05/07/20 0730 05/08/20 0611 05/10/20 0100  WBC 7.5 7.8 7.2 6.6 6.4  NEUTROABS  --   --  4.1  --   --   HGB 7.6* 7.1* 7.0* 7.0* 6.8*  HCT 23.0* 22.9* 22.8* 22.5* 22.3*  MCV 88.8 90.2 89.4 89.6 93.3  PLT 144* 158 148* 160 147*    Basic Metabolic Panel: Recent Labs  Lab 05/05/20 0210 05/06/20 0353 05/07/20 0730 05/08/20 0611 05/09/20 0151 05/10/20 0100  NA 139 137 139  136 137 137 137  K 3.6 3.9 3.9  3.8 3.9 4.1 4.4  CL 106 105 103  101 100 101 101  CO2 17* 17* 22  21* 22 24 22   GLUCOSE 132* 106* 117*  114* 120* 124* 122*  BUN 108* 117* 136*  136* 130* 128* 141*  CREATININE 2.77* 2.58* 2.62*  2.61* 2.42* 2.39* 2.39*  CALCIUM 8.5* 8.5* 8.7*  8.6* 8.7* 8.6* 8.6*  MG 1.9  --   --  1.8  --  1.8  PHOS  --  6.1* 6.0* 6.0* 6.4* 7.4*  7.3*   GFR: Estimated Creatinine Clearance: 49.3 mL/min (A) (by C-G formula based on SCr of 2.39 mg/dL (H)). Recent Labs  Lab 05/06/20 0353 05/07/20 0730 05/08/20 0611 05/10/20 0100  WBC 7.8 7.2 6.6 6.4    Liver Function Tests: Recent Labs  Lab 05/06/20 0353 05/07/20 0730 05/08/20 0611 05/09/20 0151 05/10/20 0100  AST  --  22 36  --   --   ALT  --  12 16  --   --   ALKPHOS  --  158* 191*  --   --   BILITOT  --  0.8 1.0  --   --   PROT  --  6.3* 6.1*  --   --   ALBUMIN 2.3*  2.4*  2.4* 2.5* 2.1* 2.1*   No results for input(s): LIPASE, AMYLASE in the last 168 hours. No results for input(s): AMMONIA in the last 168 hours.  ABG    Component Value Date/Time   HCO3 16.5 (L) 05-19-20 1639   TCO2 18 (L) May 19, 2020 1640   ACIDBASEDEF 7.0 (H) 05/19/2020 1639   O2SAT 100.0 05/19/2020 1639     Coagulation Profile: No results for input(s): INR, PROTIME in the last 168 hours.  Cardiac Enzymes: Recent Labs  Lab 05/04/20 1701  CKTOTAL 8*    HbA1C: Hgb A1c MFr Bld  Date/Time Value Ref Range Status  19-May-2020 10:00 PM 5.1 4.8 - 5.6 % Final    Comment:    (NOTE) Pre diabetes:  5.7%-6.4%  Diabetes:              >6.4%  Glycemic control for   <7.0% adults with diabetes     CBG: Recent Labs  Lab 05/09/20 1641 05/09/20 2010 05/09/20 2346 05/10/20 0315 05/10/20 0745  GLUCAP 112* 118* 124* 116* 122*     Cyril Mourning MD. FCCP. [facility] Pager : [phone]  If no response to pager , please call [phone] until 7 pm After 7:00 pm call Elink  [phone]     05/10/2020 9:50 AM

## 2020-05-10 NOTE — Progress Notes (Addendum)
PROGRESS NOTE    Phillip Klein  JME:[id number] DOB: March 04, 1966 DOA: 04/12/2020 PCP: [name], MD   Brief Narrative:  This 55 years old male with PMH significant for chronic hypoxic respiratory failure with tracheostomy and vent since 2021 after C-spine surgery, GERD, DM, anemia of chronic disease, quadriplegia, malnutrition, neurogenic orthostatic hypotension, untreated HCV, previous IV drug use, cirrhosis, chronic pain syndrome was sent from [facility] with septic shock.  Patient admitted in the ICU with septic shock secondary to ESBL/ [name] bacteremia.  Patient had Hickman's catheter which was removed,  presumed to be the source of infection.  Patient is getting meropenem.  Patient is also having acute kidney injury which is recovering,  nephrology is following.  Assessment & Plan:   Principal Problem:   Gram-negative bacteremia Active Problems:   Septic shock (HCC)   Pressure injury of skin   Malnutrition of moderate degree   Encounter for feeding tube placement   Hypotension   Quadriplegia ([place])   Infection due to ESBL-producing [name] pneumoniae   Chronic hepatitis C without hepatic coma (HCC)   Anemia   Jejunostomy tube leak (HCC)   Pleural effusion   Septic shock sec to [name] bacteremia from UTI vs line infection: Patient presented with septic shock at admission. Hickman catheter removed, presumed to be the source of infection. Blood cultures grew [name] bacteremia. UA + Continue meropenem for 10 days from when lines were DC'd until 3/9, repeat blood cultures negative. Patient remains afebrile, without leukocytosis. Sepsis physiology is improving.  AKI most likely ATN in setting of septic shock / Anasarca: Nephrology is following.  There is no urgent indication for dialysis at this time. Patient has received trials of albumin plus Lasix which was not effective. Nephrology recommended Lasix 120 mg every 12 hours.   Monitor BMP and  urine output.  Jejunostomy tube drainage> improved. Continue feeding through J tube. Appears to be at goal  Hypotension likely related to orthostatic hypotension. Florinef was discontinued in the ICU, probably contributing to water retention. Midodrine has been stopped. BP been improving.  Chronic vent dependent hypoxic respiratory failure: Due to quadriplegia Continue current ventilatory support with trach.  Appears at his baseline per PCCM.   Multiple pressure ulcers/wounds present on admission: Continue Wound care  Chronic pain syndrome: Continue fentanyl patch Oxycodone to prn  Depression, insomnia Continue Sertraline, trazodone. Continue clonazepam  GERD  Continue Pantroprazole  Anemia of chronic disease HGB drop to 6.8 from 7.1 Transfuse for HGB < 7, 1 U given Post transfusion Hb pending. No evidence of bleeding.   DVT prophylaxis: Heparin sq Code Status: Full code. Family Communication:  No family at bed side. Disposition Plan:    Status is: Inpatient  Remains inpatient appropriate because:Inpatient level of care appropriate due to severity of illness   Dispo: The patient is from: SNF              Anticipated d/c is to: LTAC              Patient currently is not medically stable to d/c.   Difficult to place patient No  Consultants:   PCCM.  Procedures: Hickman removed.  Antimicrobials:  Anti-infectives (From admission, onward)   Start     Dose/Rate Route Frequency Ordered Stop   05/03/20 2200  meropenem (MERREM) 1 g in sodium chloride 0.9 % 100 mL IVPB        1 g 200 mL/hr over 30 Minutes Intravenous Every 12 hours 05/03/20 1041 05/12/20 2359  04/30/20 1145  meropenem (MERREM) 2 g in sodium chloride 0.9 % 100 mL IVPB  Status:  Discontinued        2 g 200 mL/hr over 30 Minutes Intravenous Every 12 hours 04/30/20 1052 05/03/20 1041   04/30/20 0530  ceFEPIme (MAXIPIME) 2 g in sodium chloride 0.9 % 100 mL IVPB  Status:  Discontinued         2 g 200 mL/hr over 30 Minutes Intravenous Every 12 hours 04/18/2020 1852 04/30/20 1051   05/02/2020 1852  vancomycin variable dose per unstable renal function (pharmacist dosing)  Status:  Discontinued         Does not apply See admin instructions 04/22/2020 1852 04/30/20 1051   04/09/2020 1630  vancomycin (VANCOREADY) IVPB 2000 mg/400 mL        2,000 mg 200 mL/hr over 120 Minutes Intravenous  Once 04/27/2020 1621 04/09/2020 2008   04/17/2020 1630  ceFEPIme (MAXIPIME) 2 g in sodium chloride 0.9 % 100 mL IVPB        2 g 200 mL/hr over 30 Minutes Intravenous  Once 04/24/2020 1621 04/15/2020 1749      Subjective: Patient was seen and examined at bedside.  Overnight events noted.  Patient seems alert and oriented,  Talks in a husky voice.  Patient has a tracheostomy, on the vent with full mechanical support.  Objective: Vitals:   05/10/20 0824 05/10/20 0900 05/10/20 1000 05/10/20 1100  BP: 123/68 119/75 105/64 106/71  Pulse: 80 80 79 80  Resp: _0 Temp:      TempSrc:      SpO2: 96% 92% 95% 93%  Weight:      Height:        Intake/Output Summary (Last 24 hours) at 05/10/2020 1148 Last data filed at 05/10/2020 1000 Gross per 24 hour  Intake 2923.83 ml  Output 1235 ml  Net 1688.83 ml   Filed Weights   05/07/20 0500 05/09/20 0500 05/10/20 0500  Weight: 110.1 kg 112.5 kg 118 kg    Examination:  General exam: Appears calm and comfortable, chronically ill-appearing.  Not in any distress.  Patient has tracheostomy tube. Respiratory system: Clear to auscultation. Respiratory effort normal. Cardiovascular system: S1 & S2 heard, RRR. No JVD, murmurs, rubs, gallops or clicks. No pedal edema. Gastrointestinal system: Abdomen is nondistended, soft and nontender. No organomegaly or masses felt. Normal bowel sounds heard. Central nervous system: Alert and oriented.  Extremities: 2+ pitting edema.  No cyanosis no clubbing. Skin: No rashes, lesions or ulcers Psychiatry: Not assessed.     Data  Reviewed: I have personally reviewed following labs and imaging studies  CBC: Recent Labs  Lab 05/05/20 0436 05/06/20 0353 05/07/20 0730 05/08/20 0611 05/10/20 0100  WBC 7.5 7.8 7.2 6.6 6.4  NEUTROABS  --   --  4.1  --   --   HGB 7.6* 7.1* 7.0* 7.0* 6.8*  HCT 23.0* 22.9* 22.8* 22.5* 22.3*  MCV 88.8 90.2 89.4 89.6 93.3  PLT 144* 158 148* 160 159*   Basic Metabolic Panel: Recent Labs  Lab 05/05/20 0210 05/06/20 0353 05/07/20 0730 05/08/20 0611 05/09/20 0151 05/10/20 0100  NA 139 137 139  136 137 137 137  K 3.6 3.9 3.9  3.8 3.9 4.1 4.4  CL 106 105 103  101 100 101 101  CO2 17* 17* 22  21* _1 GLUCOSE 132* 106* 117*  114* 120* 124* 122*  BUN 108* 117* 136*  136* 130* 128* 141*  CREATININE 2.77* 2.58* 2.62*  2.61* 2.42* 2.39* 2.39*  CALCIUM 8.5* 8.5* 8.7*  8.6* 8.7* 8.6* 8.6*  MG 1.9  --   --  1.8  --  1.8  PHOS  --  6.1* 6.0* 6.0* 6.4* 7.4*  7.3*   GFR: Estimated Creatinine Clearance: 49.3 mL/min (A) (by C-G formula based on SCr of 2.39 mg/dL (H)). Liver Function Tests: Recent Labs  Lab 05/06/20 0353 05/07/20 0730 05/08/20 0611 05/09/20 0151 05/10/20 0100  AST  --  22 36  --   --   ALT  --  12 16  --   --   ALKPHOS  --  158* 191*  --   --   BILITOT  --  0.8 1.0  --   --   PROT  --  6.3* 6.1*  --   --   ALBUMIN 2.3* 2.4*  2.4* 2.5* 2.1* 2.1*   No results for input(s): LIPASE, AMYLASE in the last 168 hours. No results for input(s): AMMONIA in the last 168 hours. Coagulation Profile: No results for input(s): INR, PROTIME in the last 168 hours. Cardiac Enzymes: Recent Labs  Lab 05/04/20 1701  CKTOTAL 8*   BNP (last 3 results) No results for input(s): PROBNP in the last 8760 hours. HbA1C: No results for input(s): HGBA1C in the last 72 hours. CBG: Recent Labs  Lab 05/09/20 2010 05/09/20 2346 05/10/20 0315 05/10/20 0745 05/10/20 1123  GLUCAP 118* 124* 116* 122* 113*   Lipid Profile: No results for input(s): CHOL, HDL, LDLCALC, TRIG,  CHOLHDL, LDLDIRECT in the last 72 hours. Thyroid Function Tests: No results for input(s): TSH, T4TOTAL, FREET4, T3FREE, THYROIDAB in the last 72 hours. Anemia Panel: No results for input(s): VITAMINB12, FOLATE, FERRITIN, TIBC, IRON, RETICCTPCT in the last 72 hours. Sepsis Labs: No results for input(s): PROCALCITON, LATICACIDVEN in the last 168 hours.  Recent Results (from the past 240 hour(s))  Urine Culture     Status: Abnormal   Collection Time: 05/01/20  3:42 PM   Specimen: Urine, Random  Result Value Ref Range Status   Specimen Description URINE, RANDOM  Final   Special Requests   Final    NONE Performed at [facility], [address], [place], [place] [address]    Culture MULTIPLE SPECIES PRESENT, SUGGEST RECOLLECTION (A)  Final   Report Status 05/02/2020 FINAL  Final  Culture, blood (Routine X 2) w Reflex to ID Panel     Status: None (Preliminary result)   Collection Time: 05/08/20 12:44 PM   Specimen: BLOOD RIGHT HAND  Result Value Ref Range Status   Specimen Description BLOOD RIGHT HAND  Final   Special Requests   Final    BOTTLES DRAWN AEROBIC ONLY Blood Culture results may not be optimal due to an excessive volume of blood received in culture bottles   Culture   Final    NO GROWTH < 24 HOURS Performed at [facility], [place] [address] [place], [place] [address]    Report Status PENDING  Incomplete  Culture, blood (Routine X 2) w Reflex to ID Panel     Status: None (Preliminary result)   Collection Time: 05/08/20 12:45 PM   Specimen: BLOOD LEFT HAND  Result Value Ref Range Status   Specimen Description BLOOD LEFT HAND  Final   Special Requests   Final    BOTTLES DRAWN AEROBIC ONLY Blood Culture results may not be optimal due to an excessive volume of blood received in culture bottles  Culture   Final    NO GROWTH < 24 HOURS Performed at [facility], [place] [address] [place], [place] [address]    Report Status PENDING  Incomplete          Radiology Studies: No results found.      Scheduled Meds: . chlorhexidine gluconate (MEDLINE KIT)  15 mL Mouth Rinse BID  . Chlorhexidine Gluconate Cloth  6 each Topical Daily  . darbepoetin (ARANESP) injection - NON-DIALYSIS  60 mcg Subcutaneous Q Mon-1800  . feeding supplement (PROSource TF)  90 mL Per Tube TID  . fentaNYL  1 patch Transdermal Q72H   And  . fentaNYL  1 patch Transdermal Q72H  . heparin injection (subcutaneous)  5,000 Units Subcutaneous Q8H  . insulin aspart  0-9 Units Subcutaneous Q4H  . levothyroxine  125 mcg Per Tube Q0600  . mouth rinse  15 mL Mouth Rinse 10 times per day  . multivitamin with minerals  1 tablet Per Tube Daily  . nutrition supplement (JUVEN)  1 packet Per Tube BID BM  . pantoprazole sodium  40 mg Per Tube Daily  . sertraline  100 mg Per Tube Daily  . sodium chloride flush  10-40 mL Intracatheter Q12H  . traZODone  100 mg Per Tube QHS   Continuous Infusions: . sodium chloride Stopped (04/13/2020 1808)  . feeding supplement (OSMOLITE 1.5 CAL) 1,000 mL (05/10/20 0800)  . furosemide Stopped (05/10/20 0935)  . meropenem (MERREM) IV 1 g (05/10/20 1030)     LOS: 11 days    Time spent: 35 mins.   Shawna Clamp, MD [facility]   If 7PM-7AM, please contact night-coverage

## 2020-05-10 NOTE — Progress Notes (Signed)
Date and time results received: 05/10/20 1:42 AM  (use smartphrase ".now" to insert current time)  Test: Hemoglobin Critical Value: 6.8  Name of Provider Notified: Dr. Quinnlan Stain  Orders Received? Or Actions Taken?: awaiting orders

## 2020-05-10 NOTE — Progress Notes (Signed)
eLink Physician-Brief Progress Note Patient Name: Phillip Klein DOB: 26-Sep-1965 MRN: [id number]   Date of Service  05/10/2020  HPI/Events of Note  Hemoglobin 6.8 gm / dl post transfusion of one unit of PRBC.  eICU Interventions  Order entered to transfuse a second unit of PRBC.        Phillip Klein 05/10/2020, 11:13 PM

## 2020-05-10 NOTE — Progress Notes (Signed)
Wasted 75 mcgs of fentanyl with [place] Saliva, RN.

## 2020-05-10 NOTE — TOC Progression Note (Signed)
Transition of Care [facility]) - Progression Note    Patient Details  Name: Phillip Klein MRN: [id number] Date of Birth: 14-Nov-1965  Transition of [facility]) CM/SW Contact  Carley Hammed, [place] Phone Number: 05/10/2020, 4:05 PM  Clinical Narrative:    CSW attempted to contact both Prem and Cassandra at [facility] and was unable to reach them. TOC team has not been able to make contact with them for an extended period of time. CSW has advised leadership at this time due to inability to find appropriate placement for him. Will continue to follow for transition of care.   Expected Discharge Plan: Skilled Nursing Facility Barriers to Discharge: Continued Medical Work up  Expected Discharge Plan and Services Expected Discharge Plan: Skilled Nursing Facility                                               Social Determinants of Health (SDOH) Interventions    Readmission Risk Interventions No flowsheet data found.

## 2020-05-10 NOTE — Progress Notes (Signed)
Patient refused bath and dressing changes this shift d/t pain and nausea. PRNs given, see MAR. Will continue to monitor closely.

## 2020-05-10 NOTE — Progress Notes (Addendum)
Patient ID: Phillip Klein, male   DOB: 05-12-1965, 55 y.o.   MRN: [id number] S: No significant changes over the past 24 hours.  Urine output 770.  Hemoglobin 6.8 this morning.  O:BP 105/64   Pulse 79   Temp 98 F (36.7 C) (Axillary)   Resp 16   Ht '6\' 2"'  (1.88 m)   Wt 118 kg   SpO2 95%   BMI 33.40 kg/m   Intake/Output Summary (Last 24 hours) at 05/10/2020 1012 Last data filed at 05/10/2020 1000 Gross per 24 hour  Intake 2603.83 ml  Output 1170 ml  Net 1433.83 ml   Intake/Output: I/O last 3 completed shifts: In: 3766.7 [I.V.:10; Blood:356; Other:900; NG/GT:2160; IV Piggyback:340.7] Out: 1450 [Urine:1225; Stool:225]  Intake/Output this shift:  Total I/O In: 242 [NG/GT:180; IV Piggyback:62] Out: 465 [Urine:90; Stool:375] Weight change: 5.5 kg Gen: On vent via trach in NAD CVS: Normal rate Resp: decreased BS at bases Abd: +BS, soft, NT, +PEG Ext: 2+ anasarca  Recent Labs  Lab 05/04/20 0910 05/05/20 0210 05/06/20 0353 05/07/20 0730 05/08/20 0611 05/09/20 0151 05/10/20 0100  NA 135 139 137 139  136 137 137 137  K 3.2* 3.6 3.9 3.9  3.8 3.9 4.1 4.4  CL 102 106 105 103  101 100 101 101  CO2 18* 17* 17* 22  21* '22 24 22  ' GLUCOSE 107* 132* 106* 117*  114* 120* 124* 122*  BUN 99* 108* 117* 136*  136* 130* 128* 141*  CREATININE 2.91* 2.77* 2.58* 2.62*  2.61* 2.42* 2.39* 2.39*  ALBUMIN  --   --  2.3* 2.4*  2.4* 2.5* 2.1* 2.1*  CALCIUM 8.3* 8.5* 8.5* 8.7*  8.6* 8.7* 8.6* 8.6*  PHOS  --   --  6.1* 6.0* 6.0* 6.4* 7.4*  7.3*  AST  --   --   --  22 36  --   --   ALT  --   --   --  12 16  --   --    Liver Function Tests: Recent Labs  Lab 05/07/20 0730 05/08/20 0611 05/09/20 0151 05/10/20 0100  AST 22 36  --   --   ALT 12 16  --   --   ALKPHOS 158* 191*  --   --   BILITOT 0.8 1.0  --   --   PROT 6.3* 6.1*  --   --   ALBUMIN 2.4*  2.4* 2.5* 2.1* 2.1*   No results for input(s): LIPASE, AMYLASE in the last 168 hours. No results for input(s): AMMONIA in the last  168 hours. CBC: Recent Labs  Lab 05/05/20 0436 05/06/20 0353 05/07/20 0730 05/08/20 0611 05/10/20 0100  WBC 7.5 7.8 7.2 6.6 6.4  NEUTROABS  --   --  4.1  --   --   HGB 7.6* 7.1* 7.0* 7.0* 6.8*  HCT 23.0* 22.9* 22.8* 22.5* 22.3*  MCV 88.8 90.2 89.4 89.6 93.3  PLT 144* 158 148* 160 147*   Cardiac Enzymes: Recent Labs  Lab 05/04/20 1701  CKTOTAL 8*   CBG: Recent Labs  Lab 05/09/20 1641 05/09/20 2010 05/09/20 2346 05/10/20 0315 05/10/20 0745  GLUCAP 112* 118* 124* 116* 122*    Iron Studies: No results for input(s): IRON, TIBC, TRANSFERRIN, FERRITIN in the last 72 hours. Studies/Results: No results found. . chlorhexidine gluconate (MEDLINE KIT)  15 mL Mouth Rinse BID  . Chlorhexidine Gluconate Cloth  6 each Topical Daily  . feeding supplement (PROSource TF)  90 mL Per Tube TID  .  fentaNYL  1 patch Transdermal Q72H   And  . fentaNYL  1 patch Transdermal Q72H  . heparin injection (subcutaneous)  5,000 Units Subcutaneous Q8H  . insulin aspart  0-9 Units Subcutaneous Q4H  . levothyroxine  125 mcg Per Tube Q0600  . mouth rinse  15 mL Mouth Rinse 10 times per day  . multivitamin with minerals  1 tablet Per Tube Daily  . nutrition supplement (JUVEN)  1 packet Per Tube BID BM  . pantoprazole sodium  40 mg Per Tube Daily  . sertraline  100 mg Per Tube Daily  . sodium chloride flush  10-40 mL Intracatheter Q12H  . traZODone  100 mg Per Tube QHS    BMET    Component Value Date/Time   NA 137 05/10/2020 0100   K 4.4 05/10/2020 0100   CL 101 05/10/2020 0100   CO2 22 05/10/2020 0100   GLUCOSE 122 (H) 05/10/2020 0100   BUN 141 (H) 05/10/2020 0100   CREATININE 2.39 (H) 05/10/2020 0100   CALCIUM 8.6 (L) 05/10/2020 0100   GFRNONAA 32 (L) 05/10/2020 0100   CBC    Component Value Date/Time   WBC 6.4 05/10/2020 0100   RBC 2.39 (L) 05/10/2020 0100   HGB 6.8 (LL) 05/10/2020 0100   HCT 22.3 (L) 05/10/2020 0100   PLT 147 (L) 05/10/2020 0100   MCV 93.3 05/10/2020 0100    MCH 28.5 05/10/2020 0100   MCHC 30.5 05/10/2020 0100   RDW 16.6 (H) 05/10/2020 0100   LYMPHSABS 1.6 05/07/2020 0730   MONOABS 0.4 05/07/2020 0730   EOSABS 1.0 (H) 05/07/2020 0730   BASOSABS 0.0 05/07/2020 0730    Assessment/Plan: 1. AKI - likely ischemic ATN in setting of septic shock requiring pressors. Has chronic hypotension.  Creatinine has been stable around 2.7-2.9.  Improved to 2.4 today 1. No urgent indication for dialysis at this time. 2. Lasix 120 mg twice daily given volume overload 3. IV albumin as needed 4. Will need Nephrology follow up at [facility] or [facility] once bed available for transfer. 2. Sepsis due to klebsiella bacteremia -management per primary 3. Chronic hypotension: Multifactorial with dysautonomia contributing.  Florinef likely contributing to edema.  Blood pressure has been okay so could consider holding. Droxidopa could be considered but would need to ensure its availability at long term facility 4. J tube drainage - improved.  5. Severe protein malnutrition - tube feeds, nutrition assisting 6. Acute on chronic diastolic CHF (LVEF 79-72% with grade II DD). Also has anasarca from hypoalbuminemia.Lasix as above 7. 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 On vent via trach 8. Multiple pressure ulcers/wounds - wound care 9. Chronic pain - on fentanyl patch and oxycodone. 10. Anemia: Hemoglobin 6.8.  Likely multifactorial.  Transfusion per primary team. Will give Aranesp given repeated need for transfusions.  May check iron stores in 1 to 2 weeks after transfusion 11. Disposition - awaiting transfer back to [facility] or [facility] pending bed availability.

## 2020-05-11 ENCOUNTER — Inpatient Hospital Stay (HOSPITAL_COMMUNITY): Payer: Medicare Other

## 2020-05-11 ENCOUNTER — Inpatient Hospital Stay: Payer: Self-pay

## 2020-05-11 ENCOUNTER — Encounter (HOSPITAL_COMMUNITY): Payer: Self-pay | Admitting: Pulmonary Disease

## 2020-05-11 DIAGNOSIS — Z950 Presence of cardiac pacemaker: Secondary | ICD-10-CM

## 2020-05-11 DIAGNOSIS — R601 Generalized edema: Secondary | ICD-10-CM

## 2020-05-11 DIAGNOSIS — M7989 Other specified soft tissue disorders: Secondary | ICD-10-CM | POA: Diagnosis not present

## 2020-05-11 DIAGNOSIS — A4159 Other Gram-negative sepsis: Secondary | ICD-10-CM | POA: Diagnosis not present

## 2020-05-11 DIAGNOSIS — R7881 Bacteremia: Secondary | ICD-10-CM | POA: Diagnosis not present

## 2020-05-11 DIAGNOSIS — I959 Hypotension, unspecified: Secondary | ICD-10-CM | POA: Diagnosis not present

## 2020-05-11 DIAGNOSIS — G8250 Quadriplegia, unspecified: Secondary | ICD-10-CM | POA: Diagnosis not present

## 2020-05-11 LAB — COMPREHENSIVE METABOLIC PANEL
ALT: 20 U/L (ref 0–44)
AST: 36 U/L (ref 15–41)
Albumin: 2 g/dL — ABNORMAL LOW (ref 3.5–5.0)
Alkaline Phosphatase: 217 U/L — ABNORMAL HIGH (ref 38–126)
Anion gap: 12 (ref 5–15)
BUN: 152 mg/dL — ABNORMAL HIGH (ref 6–20)
CO2: 24 mmol/L (ref 22–32)
Calcium: 9 mg/dL (ref 8.9–10.3)
Chloride: 101 mmol/L (ref 98–111)
Creatinine, Ser: 2.3 mg/dL — ABNORMAL HIGH (ref 0.61–1.24)
GFR, Estimated: 33 mL/min — ABNORMAL LOW (ref >60)
Glucose, Bld: 125 mg/dL — ABNORMAL HIGH (ref 70–99)
Potassium: 4.4 mmol/L (ref 3.5–5.1)
Sodium: 137 mmol/L (ref 135–145)
Total Bilirubin: 0.6 mg/dL (ref 0.3–1.2)
Total Protein: 6.3 g/dL — ABNORMAL LOW (ref 6.5–8.1)

## 2020-05-11 LAB — POCT I-STAT 7, (LYTES, BLD GAS, ICA,H+H)
Acid-Base Excess: 1 mmol/L (ref 0.0–2.0)
Bicarbonate: 24.9 mmol/L (ref 20.0–28.0)
Calcium, Ion: 1.18 mmol/L (ref 1.15–1.40)
HCT: 21 % — ABNORMAL LOW (ref 39.0–52.0)
Hemoglobin: 7.1 g/dL — ABNORMAL LOW (ref 13.0–17.0)
O2 Saturation: 98 %
Patient temperature: 97.7
Potassium: 4.4 mmol/L (ref 3.5–5.1)
Sodium: 136 mmol/L (ref 135–145)
TCO2: 26 mmol/L (ref 22–32)
pCO2 arterial: 32.3 mmHg (ref 32.0–48.0)
pH, Arterial: 7.493 — ABNORMAL HIGH (ref 7.350–7.450)
pO2, Arterial: 100 mmHg (ref 83.0–108.0)

## 2020-05-11 LAB — PREPARE RBC (CROSSMATCH)

## 2020-05-11 LAB — PHOSPHORUS: Phosphorus: 7.5 mg/dL — ABNORMAL HIGH (ref 2.5–4.6)

## 2020-05-11 LAB — GLUCOSE, CAPILLARY
Glucose-Capillary: 118 mg/dL — ABNORMAL HIGH (ref 70–99)
Glucose-Capillary: 121 mg/dL — ABNORMAL HIGH (ref 70–99)
Glucose-Capillary: 123 mg/dL — ABNORMAL HIGH (ref 70–99)
Glucose-Capillary: 113 mg/dL — ABNORMAL HIGH (ref 70–99)
Glucose-Capillary: 105 mg/dL — ABNORMAL HIGH (ref 70–99)
Glucose-Capillary: 131 mg/dL — ABNORMAL HIGH (ref 70–99)
Glucose-Capillary: 120 mg/dL — ABNORMAL HIGH (ref 70–99)

## 2020-05-11 LAB — CBC
HCT: 22 % — ABNORMAL LOW (ref 39.0–52.0)
Hemoglobin: 6.7 g/dL — CL (ref 13.0–17.0)
MCH: 28 pg (ref 26.0–34.0)
MCHC: 30.5 g/dL (ref 30.0–36.0)
MCV: 92.1 fL (ref 80.0–100.0)
Platelets: 139 10*3/uL — ABNORMAL LOW (ref 150–400)
RBC: 2.39 MIL/uL — ABNORMAL LOW (ref 4.22–5.81)
RDW: 16.7 % — ABNORMAL HIGH (ref 11.5–15.5)
WBC: 7.7 10*3/uL (ref 4.0–10.5)
nRBC: 0 % (ref 0.0–0.2)

## 2020-05-11 LAB — IRON AND TIBC
Iron: 33 ug/dL — ABNORMAL LOW (ref 45–182)
Saturation Ratios: 28 % (ref 17.9–39.5)
TIBC: 118 ug/dL — ABNORMAL LOW (ref 250–450)
UIBC: 85 ug/dL

## 2020-05-11 LAB — APTT: aPTT: 38 seconds — ABNORMAL HIGH (ref 24–36)

## 2020-05-11 LAB — OCCULT BLOOD X 1 CARD TO LAB, STOOL: Fecal Occult Bld: NEGATIVE

## 2020-05-11 LAB — MAGNESIUM: Magnesium: 1.7 mg/dL (ref 1.7–2.4)

## 2020-05-11 LAB — PROTIME-INR
INR: 1.2 (ref 0.8–1.2)
Prothrombin Time: 15.1 seconds (ref 11.4–15.2)

## 2020-05-11 LAB — HEMOGLOBIN AND HEMATOCRIT, BLOOD
HCT: 32.7 % — ABNORMAL LOW (ref 39.0–52.0)
Hemoglobin: 10 g/dL — ABNORMAL LOW (ref 13.0–17.0)

## 2020-05-11 MED ORDER — SODIUM CHLORIDE 0.9% IV SOLUTION: Freq: Once | INTRAVENOUS | Status: AC

## 2020-05-11 MED ORDER — SODIUM CHLORIDE 0.9% FLUSH: 10.0000 mL | INTRAVENOUS | Status: DC | PRN

## 2020-05-11 MED ORDER — CLONAZEPAM 0.5 MG PO TBDP
0.5000 mg | ORAL_TABLET | Freq: Three times a day (TID) | ORAL | Status: DC | PRN
  Administered 2020-05-11 (×2): 0.5 mg
  Filled 2020-05-11 (×2): qty 1

## 2020-05-11 MED ORDER — AMLODIPINE BESYLATE 5 MG PO TABS
5.0000 mg | ORAL_TABLET | Freq: Every day | ORAL | Status: DC
  Administered 2020-05-11 – 2020-05-15 (×5): 5 mg
  Filled 2020-05-11 (×5): qty 1

## 2020-05-11 MED ORDER — SODIUM CHLORIDE 0.9% FLUSH
10.0000 mL | Freq: Two times a day (BID) | INTRAVENOUS | Status: DC
  Administered 2020-05-11 – 2020-05-25 (×27): 10 mL
  Administered 2020-05-25: 20 mL
  Administered 2020-05-26 – 2020-06-03 (×18): 10 mL
  Administered 2020-06-04: 20 mL
  Administered 2020-06-04 – 2020-06-06 (×5): 10 mL

## 2020-05-11 MED ORDER — HEPARIN SODIUM (PORCINE) 5000 UNIT/ML IJ SOLN
5000.0000 [IU] | Freq: Three times a day (TID) | INTRAMUSCULAR | Status: DC
  Administered 2020-05-11 – 2020-05-14 (×9): 5000 [IU] via SUBCUTANEOUS
  Filled 2020-05-11 (×10): qty 1

## 2020-05-11 MED ORDER — DEXTROSE 5 % IV SOLN
250.0000 mg | Freq: Once | INTRAVENOUS | Status: AC
  Administered 2020-05-11: 250 mg via INTRAVENOUS
  Filled 2020-05-11: qty 250

## 2020-05-11 MED ORDER — CLONAZEPAM 0.5 MG PO TBDP
0.5000 mg | ORAL_TABLET | Freq: Four times a day (QID) | ORAL | Status: DC | PRN
  Administered 2020-05-12 (×2): 0.5 mg
  Filled 2020-05-11 (×2): qty 1

## 2020-05-11 MED ORDER — SODIUM CHLORIDE 0.9 % IV SOLN
1.0000 g | Freq: Three times a day (TID) | INTRAVENOUS | Status: AC
  Administered 2020-05-11 – 2020-05-12 (×5): 1 g via INTRAVENOUS
  Filled 2020-05-11 (×5): qty 1

## 2020-05-11 MED ORDER — SODIUM CHLORIDE 0.9 % IV SOLN
1.0000 g | Freq: Three times a day (TID) | INTRAVENOUS | Status: DC
  Filled 2020-05-11 (×2): qty 1

## 2020-05-11 NOTE — Progress Notes (Signed)
Peripherally Inserted Central Catheter Placement  The IV Nurse has discussed with the patient and/or persons authorized to consent for the patient, the purpose of this procedure and the potential benefits and risks involved with this procedure.  The benefits include less needle sticks, lab draws from the catheter, and the patient may be discharged home with the catheter. Risks include, but not limited to, infection, bleeding, blood clot (thrombus formation), and puncture of an artery; nerve damage and irregular heartbeat and possibility to perform a PICC exchange if needed/ordered by physician.  Alternatives to this procedure were also discussed.  Bard Power PICC patient education guide, fact sheet on infection prevention and patient information card has been provided to patient /or left at bedside.    PICC Placement Documentation  PICC Double Lumen 05/11/20 PICC Right Basilic 42 cm 0 cm (Active)  Exposed Catheter (cm) 0 cm 05/11/20 1120  Lumen #1 Status Saline locked;Blood return noted 05/11/20 1120  Lumen #2 Status Saline locked;Blood return noted 05/11/20 1120  Dressing Type Transparent;Securing device 05/11/20 1120  Dressing Status Clean;Dry;Intact 05/11/20 1120  Antimicrobial disc in place? Yes 05/11/20 1120  Safety Lock Not Applicable 05/11/20 1120  Line Adjustment (NICU/IV Team Only) No 05/11/20 1120  Dressing Change Due 05/18/20 05/11/20 1120       Romie Jumper 05/11/2020, 11:25 AM

## 2020-05-11 NOTE — Progress Notes (Signed)
PHARMACY NOTE:  ANTIMICROBIAL RENAL DOSAGE ADJUSTMENT  Current antimicrobial regimen includes a mismatch between antimicrobial dosage and estimated renal function.  As per policy approved by the Pharmacy & Therapeutics and Medical Executive Committees, the antimicrobial dosage will be adjusted accordingly.  Current antimicrobial dosage:  Meropenem 1g q12h  Indication: ESBL K pneumoniae bacteremia  Renal Function:   Estimated Creatinine Clearance: 50.5 mL/min (A) (by C-G formula based on SCr of 2.3 mg/dL (H)).     Antimicrobial dosage has been changed to:  Meropenem 1g q8h   Additional comments:   Thank you for allowing pharmacy to be a part of this patient's care.  Gerrit Halls, PharmD Clinical Pharmacist  05/11/2020 7:04 AM

## 2020-05-11 NOTE — Progress Notes (Signed)
VAST consulted to assess pt's LA midline.  Pt has extreme pitting edema to left arm, both above and below midline site (greater than right arm). Bruising is also noted proximal to midline catheter site with increased firmness. Midline has sluggish blood return and flushes easily. Suggested physician be contacted for Doppler US to assess for DVT.  Advised not to use midline at this time, but to leave in place until [place] completed in case MD wants to treat possible DVT.

## 2020-05-11 NOTE — Progress Notes (Signed)
Upon morning assessment, patient's left shoulder appeared bruised, red & warm above left arm midline. IV team was consulted and blood transfusion was moved to other PIV site. Per IV team RN, midline has sluggish blood return and flushes okay. MD ordered venous duplex of left upper arm. Will continue to monitor.   Darrold Span

## 2020-05-11 NOTE — Progress Notes (Signed)
Patient ID: Phillip Klein, male   DOB: 11-03-1965, 55 y.o.   MRN: [id number] S: Persistent anemia.  Hemoglobin remains less than 7.  Florinef held.  Blood pressures okay.  Creatinine improving  O:BP 134/71   Pulse 83   Temp 97.8 F (36.6 C) (Axillary)   Resp 18   Ht _0  (1.88 m)   Wt 114.5 kg   SpO2 92%   BMI 32.41 kg/m   Intake/Output Summary (Last 24 hours) at 05/11/2020 0942 Last data filed at 05/11/2020 0900 Gross per 24 hour  Intake 3951.19 ml  Output 1325 ml  Net 2626.19 ml   Intake/Output: I/O last 3 completed shifts: In: 4539.9 [I.V.:10; Blood:1273.1; Other:835; NG/GT:2160; IV Piggyback:261.8] Out: 2030 [Urine:1305; Stool:725]  Intake/Output this shift:  Total I/O In: 742.9 [Blood:397.9; NG/GT:120; IV Piggyback:225] Out: 140 [Urine:140] Weight change: -3.5 kg Gen: On vent via trach in NAD CVS: Normal rate Resp: decreased BS at bases Abd: +BS, soft, NT, +PEG Ext: 2+ anasarca  Recent Labs  Lab 05/05/20 0210 05/06/20 0353 05/07/20 0730 05/08/20 0611 05/09/20 0151 05/10/20 0100 05/11/20 0425  NA 139 137 139  136 137 137 137 137  K 3.6 3.9 3.9  3.8 3.9 4.1 4.4 4.4  CL 106 105 103  101 100 101 101 101  CO2 17* 17* 22  21* _1 GLUCOSE 132* 106* 117*  114* 120* 124* 122* 125*  BUN 108* 117* 136*  136* 130* 128* 141* 152*  CREATININE 2.77* 2.58* 2.62*  2.61* 2.42* 2.39* 2.39* 2.30*  ALBUMIN  --  2.3* 2.4*  2.4* 2.5* 2.1* 2.1* 2.0*  CALCIUM 8.5* 8.5* 8.7*  8.6* 8.7* 8.6* 8.6* 9.0  PHOS  --  6.1* 6.0* 6.0* 6.4* 7.4*  7.3* 7.5*  AST  --   --  22 36  --   --  36  ALT  --   --  12 16  --   --  20   Liver Function Tests: Recent Labs  Lab 05/07/20 0730 05/08/20 0611 05/09/20 0151 05/10/20 0100 05/11/20 0425  AST 22 36  --   --  36  ALT 12 16  --   --  20  ALKPHOS 158* 191*  --   --  217*  BILITOT 0.8 1.0  --   --  0.6  PROT 6.3* 6.1*  --   --  6.3*  ALBUMIN 2.4*  2.4* 2.5* 2.1* 2.1* 2.0*   No results for input(s): LIPASE, AMYLASE in  the last 168 hours. No results for input(s): AMMONIA in the last 168 hours. CBC: Recent Labs  Lab 05/06/20 0353 05/07/20 0730 05/08/20 0611 05/10/20 0100 05/10/20 1241 05/10/20 2131 05/11/20 0425  WBC 7.8 7.2 6.6 6.4  --   --  7.7  NEUTROABS  --  4.1  --   --   --   --   --   HGB 7.1* 7.0* 7.0* 6.8* 6.9* 6.8* 6.7*  HCT 22.9* 22.8* 22.5* 22.3* 21.9* 22.3* 22.0*  MCV 90.2 89.4 89.6 93.3  --   --  92.1  PLT 158 148* 160 147*  --   --  139*   Cardiac Enzymes: Recent Labs  Lab 05/04/20 1701  CKTOTAL 8*   CBG: Recent Labs  Lab 05/10/20 1653 05/10/20 1954 05/11/20 0104 05/11/20 0430 05/11/20 0734  GLUCAP 123* 138* 118* 121* 123*    Iron Studies:  Recent Labs    05/11/20 0425  IRON 33*  TIBC 118*   Studies/Results: [place]  EKG SITE RITE  Result Date: 05/11/2020 If Site Rite image not attached, placement could not be confirmed due to current cardiac rhythm.  . chlorhexidine gluconate (MEDLINE KIT)  15 mL Mouth Rinse BID  . Chlorhexidine Gluconate Cloth  6 each Topical Daily  . darbepoetin (ARANESP) injection - NON-DIALYSIS  60 mcg Subcutaneous Q Mon-1800  . feeding supplement (PROSource TF)  90 mL Per Tube TID  . fentaNYL  1 patch Transdermal Q72H   And  . fentaNYL  1 patch Transdermal Q72H  . insulin aspart  0-9 Units Subcutaneous Q4H  . levothyroxine  125 mcg Per Tube Q0600  . mouth rinse  15 mL Mouth Rinse 10 times per day  . multivitamin with minerals  1 tablet Per Tube Daily  . nutrition supplement (JUVEN)  1 packet Per Tube BID BM  . pantoprazole sodium  40 mg Per Tube Daily  . sertraline  100 mg Per Tube Daily  . sodium chloride flush  10-40 mL Intracatheter Q12H  . traZODone  100 mg Per Tube QHS    BMET    Component Value Date/Time   NA 137 05/11/2020 0425   K 4.4 05/11/2020 0425   CL 101 05/11/2020 0425   CO2 24 05/11/2020 0425   GLUCOSE 125 (H) 05/11/2020 0425   BUN 152 (H) 05/11/2020 0425   CREATININE 2.30 (H) 05/11/2020 0425   CALCIUM 9.0  05/11/2020 0425   GFRNONAA 33 (L) 05/11/2020 0425   CBC    Component Value Date/Time   WBC 7.7 05/11/2020 0425   RBC 2.39 (L) 05/11/2020 0425   HGB 6.7 (LL) 05/11/2020 0425   HCT 22.0 (L) 05/11/2020 0425   PLT 139 (L) 05/11/2020 0425   MCV 92.1 05/11/2020 0425   MCH 28.0 05/11/2020 0425   MCHC 30.5 05/11/2020 0425   RDW 16.7 (H) 05/11/2020 0425   LYMPHSABS 1.6 05/07/2020 0730   MONOABS 0.4 05/07/2020 0730   EOSABS 1.0 (H) 05/07/2020 0730   BASOSABS 0.0 05/07/2020 0730    Assessment/Plan: 1. AKI - likely ischemic ATN in setting of septic shock requiring pressors. Has chronic hypotension.  Creatinine improving down to 2.3 1. No indications for dialysis at this time 2. Lasix 120 mg twice daily given volume overload 3. IV albumin as needed 4. Will need Nephrology follow up at [facility] or [facility] once bed available for transfer. 2. Sepsis due to klebsiella bacteremia -management per primary 3. Chronic hypotension: Multifactorial with dysautonomia contributing.  Pressure okay despite holding Florinef.  droxidopa could be considered but would need to ensure its availability at long term facility.  Not necessary at this time 4. Severe protein malnutrition - tube feeds, nutrition assisting 5. Acute on chronic diastolic CHF (LVEF 06-23% with grade II DD). Also has anasarca from hypoalbuminemia.Lasix as above 6. 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 On vent via trach 7. Multiple pressure ulcers/wounds - wound care 8. Chronic pain - on fentanyl patch and oxycodone. 9. Anemia: Hemoglobin less than 7.  Likely multifactorial.  Transfusion per primary team. Will give Aranesp given repeated need for transfusions.  May check iron stores in 1 to 2 weeks after transfusion 10. Disposition - awaiting transfer back to [facility] or [facility] pending bed availability.

## 2020-05-11 NOTE — Progress Notes (Addendum)
Patient has consistently complained of shortness of breath and stating "I need more oxygen." Patient was placed on 100% FiO2 after 5 consecutive O2 breaths (100%). Notified MD (Dr. Delton Coombes) of changed vent setting and he states patient's FiO2 should not be increased if SpO2 >90%. Dr. Delton Coombes and nursing staff have educated the patient that his SpO2 has remained >90% and that increasing FiO2 would not help the feeling of shortness of breath, and patient continues to request "O2 breaths". Dr. Delton Coombes assessed the patient and changed vent settings to FiO2 50%. Patient has maintained SpO2 >90% since FiO2 has been decreased. Will continue to monitor.  Jayme Cloud

## 2020-05-11 NOTE — Progress Notes (Signed)
eLink Physician-Brief Progress Note Patient Name: Cormac Wint DOB: June 26, 1965 MRN: [id number]   Date of Service  05/11/2020  HPI/Events of Note  Hemoglobin remains  6.7 gm  / dl after a second unit of blood, and patient has oozing across his various wounds.  eICU Interventions  PT, PTT check ordered, transfuse one more unit of PRBC.        Thomasene Lot Christol Thetford 05/11/2020, 6:15 AM

## 2020-05-11 NOTE — Consult Note (Addendum)
Cardiology Consultation:   Patient ID: Phillip Klein; [id number]; 06/09/1965   Admit date: 05/01/2020 Date of Consult: 05/11/2020  Primary Care Provider: [name], MD Primary Cardiologist: Sanda Klein, MD New Primary Electrophysiologist:  None   Patient Profile:   Phillip Klein is a 55 y.o. 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  History of Present Illness:   Phillip Klein was hospitalized from 06/30-09/01/2020 with acute resp failure, spinal abscess, quadriplegia, abdominal catastrophe due to TF in stomach now trach/PEG/Jtube, and SIRS, among multiple other issues. D/c to [facility].  Pt transferred to [facility] 02/24 with hypotension after J tube placement. He was in septic shock, required fluid resuscitation. Also in acute on chronic resp failure with 3rd spacing fluid. Albumin <1.    Since admission, pt has gained 30 lbs, peak wt 260 lbs. Significant body edema, cards asked to see. I/O +10.7 L.   Phillip Klein has a tube to speak but his speech is hard to understand. He has chronic pain issues and is hurting now. He is also SOB, wants to sit up higher. However, he is as high up as he can get.  Hospital events: Admitted 2/24 02/24 1/4 blood cx +[name] ESBL Hickman catheter removed 02/26, felt to be the source of infection. 3/2 albumin given 3/3 lasix started 3/5 trial of SIMV/PS  03/07 Hgb 6.8 s/p transfusion  Pt w/  AKI likely 2nd ATN in setting of septic shock. Cr 1.79 on admit, peak 2.91, currently 2.30. Currently on Lasix 120 mg q 12 hr. Wt down 6 lbs overnight.  Hypotension felt 2nd dysautonomia, florinef d/c'd as it was felt contributing to water retention, midodrine also stopped. BP has improved.   Past Medical History:  Diagnosis Date  . Anemia, chronic disease   . Diabetes mellitus type 2, insulin dependent ([place])   . 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 GERD (gastroesophageal reflux disease)   . Hepatitis C   . PEA (Pulseless electrical activity) ([place]) 09/2019  . Respiratory failure with hypoxia ([place]) 2021  . Sinus bradycardia with pauses 09/2019   s/p MDT MICRA leadless PPM    Past Surgical History:  Procedure Laterality Date  . CERVICAL SPINE SURGERY  2021  . PACEMAKER LEADLESS INSERTION  09/2019   MDT MIRCA leadless PPM  . TRACHEOSTOMY       Prior to Admission medications   Medication Sig Start Date End Date Taking? Authorizing Provider  acetaminophen (TYLENOL) 325 MG tablet Place 650 mg into feeding tube every 6 (six) hours as needed for mild pain.   Yes [provider]  alum & mag hydroxide-simeth (MAG-AL PLUS) 200-200-20 MG/5ML suspension Place 30 mLs into feeding tube every 6 (six) hours as needed (GERD WITH ESOPHAGITIS WITHOUT BLEEDING).   Yes [provider]  amiodarone (PACERONE) 200 MG tablet Place 200 mg into feeding tube in the morning.   Yes [provider]  amLODipine (NORVASC) 5 MG tablet Place 5 mg into feeding tube daily.   Yes [provider]  chlorhexidine (PERIDEX) 0.12 % solution 15 mLs by Mouth Rinse route 2 (two) times daily.   Yes [provider]  clonazePAM (KLONOPIN) 0.5 MG tablet Place 0.5 mg into feeding tube every 6 (six) hours as needed (for generalized anxiety disorder).   Yes [provider]  fentaNYL (DURAGESIC) 100 MCG/HR Place 1 patch onto the skin every 3 (three) days.   Yes [provider]  fentaNYL (DURAGESIC) 50 MCG/HR Place 1 patch onto the skin every 3 (three) days.   Yes [provider]  ferrous sulfate 220 (44 Fe) MG/5ML solution Place 220 mg into feeding tube in the morning and at bedtime.   Yes [provider]  fludrocortisone (FLORINEF) 0.1 MG tablet 0.2 mg See admin instructions. 0.2 mg, per G-Tube, every twelve hours   Yes [provider]  furosemide (LASIX) 40 MG tablet Place 40 mg into feeding tube in the morning.   Yes [provider]  ipratropium-albuterol (DUONEB) 0.5-2.5 (3) MG/3ML SOLN Take 3 mLs by nebulization every 4 (four) hours as needed (for acute and chronic respiratory failure with hypercapnia).   Yes [provider]  lansoprazole (PREVACID SOLUTAB) 30 MG disintegrating tablet Place 30 mg into feeding tube in the morning.   Yes [provider]  levothyroxine (SYNTHROID) 125 MCG tablet Place 125 mcg into feeding tube in the morning.   Yes [provider]  loperamide (IMODIUM A-D) 2 MG tablet Place 2 mg into feeding tube every 8 (eight) hours as needed for diarrhea or loose stools.   Yes [provider]  Nutritional Supplements (FEEDING SUPPLEMENT, VITAL 1.5 CAL,) LIQD Place 50 mL/hr into feeding tube continuous.   Yes [provider]  ondansetron (ZOFRAN-ODT) 4 MG disintegrating tablet 4 mg See admin instructions. 4 mg, per G-Tube, every six hours as needed for nausea   Yes [provider]  Oxycodone HCl 10 MG TABS Place 10 mg into feeding tube every 6 (six) hours as needed (for severe pain).   Yes [provider]  PRESCRIPTION MEDICATION Inject 42 mL/hr into the vein See admin instructions. D10W (dextrose 10% in water): 42 ml's/hr "by shift"   Yes [provider]  sertraline (ZOLOFT) 100 MG tablet Place 100 mg into feeding tube daily.   Yes [provider]  Sodium Chloride Flush (NORMAL SALINE FLUSH) 0.9 % SOLN Inject 10 mLs into  the vein See admin instructions. Flush with 10 ml's of normal saline before and after medications   Yes [provider]  traZODone (DESYREL) 100 MG tablet Place 100 mg into feeding tube at bedtime.   Yes [provider]    Inpatient Medications: Scheduled Meds: . amLODipine  5 mg Per Tube Daily  . chlorhexidine gluconate (MEDLINE KIT)  15 mL Mouth Rinse BID  . Chlorhexidine Gluconate Cloth  6 each Topical Daily  . darbepoetin (ARANESP) injection - NON-DIALYSIS  60 mcg Subcutaneous Q Mon-1800  . feeding supplement (PROSource TF)  90 mL Per Tube TID  . fentaNYL  1 patch Transdermal Q72H   And  . fentaNYL  1 patch Transdermal Q72H  . heparin injection (subcutaneous)  5,000 Units Subcutaneous Q8H  . insulin aspart  0-9 Units Subcutaneous Q4H  . levothyroxine  125 mcg Per Tube Q0600  . mouth rinse  15 mL Mouth Rinse 10 times per day  . multivitamin with minerals  1 tablet Per Tube Daily  . nutrition supplement (JUVEN)  1 packet Per Tube BID BM  . pantoprazole sodium  40 mg Per Tube Daily  . sertraline  100 mg Per Tube Daily  . sodium chloride flush  10-40 mL Intracatheter Q12H  . sodium chloride flush  10-40 mL Intracatheter Q12H  . traZODone  100 mg Per Tube QHS   Continuous Infusions: . sodium chloride Stopped (04/21/2020 1808)  . chlorothiazide (DIURIL) IV    . feeding supplement (OSMOLITE 1.5 CAL) 1,000 mL (05/10/20 1657)  . furosemide Stopped (05/11/20 0959)  . meropenem (MERREM) IV 200 mL/hr at 05/11/20 1600   PRN Meds: clonazePAM, docusate, fentaNYL (SUBLIMAZE) injection, oxyCODONE, polyethylene glycol, prochlorperazine, sodium chloride flush, sodium chloride flush  Allergies:    Allergies  Allergen Reactions  . Acetaminophen Other (See Comments)    "SHUTS DOWN MY ORGANS"  . Gabapentin Other (See Comments)    SUICIDAL IDEATION- Suicidal thoughts and actions     Social History:   Social History   Socioeconomic History  . Marital status: Single     Spouse name: Not on file  . Number of children: Not on file  . Years of education: Not on file  . Highest education level: Not on file  Occupational History  . Not on file  Tobacco Use  . Smoking status: Never Smoker  . Smokeless tobacco: Never Used  Substance and Sexual Activity  . Alcohol use: Not Currently  . Drug use: Yes    Types: IV, Cocaine, Heroin  . Sexual activity: Not on file  Other Topics Concern  . Not on file  Social History Narrative  . Not on file   Social Determinants of Health   Financial Resource Strain: Not on file  Food Insecurity: Not on file  Transportation Needs: Not on file  Physical Activity: Not on file  Stress: Not on file  Social Connections: Not on file  Intimate Partner Violence: Not on file    Family History:   Family History  Problem Relation Age of Onset  . Hypertension Mother   . Hypertension Father   . COPD Brother    Family Status:  Family Status  Relation Name Status  . Mother  Deceased  . Father  Deceased  . Brother  Alive    ROS:  Please see the history of present illness.  All other ROS reviewed and negative.     Physical Exam/Data:   Vitals:   05/11/20 1400 05/11/20 1500 05/11/20 1515 05/11/20 1600  BP: 127/73 (!) 142/77  132/86  Pulse: 85 89 90 81  Resp: _0 Temp:   98.1 F (36.7 C)   TempSrc:   Axillary   SpO2: 100% 98% 90% 90%  Weight:      Height:        Intake/Output Summary (Last 24 hours) at 05/11/2020 1621 Last data filed at 05/11/2020 1600 Gross per 24 hour  Intake 3525.8 ml  Output 1455 ml  Net 2070.8 ml    Last 3 Weights 05/11/2020 05/10/2020 05/09/2020  Weight (lbs) 252 lb 6.8 oz 260 lb 2.3 oz 248 lb 0.3 oz  Weight (kg) 114.5 kg 118 kg 112.5 kg     Body mass index is 32.41 kg/m.   General:  Chronically-ill appearing, male on the vent HEENT: normal for age Lymph: no adenopathy noted Neck: JVD - not able to see accurately Endocrine:  No thryomegaly Vascular: No carotid bruits; upper  extremity pulses 2+, not able to get pedal pulses due to edema Cardiac:  normal S1, S2; RRR; no murmur Lungs:  Few rales anteriorly, no  wheezing, rhonchi    Abd: soft, nontender, no hepatomegaly  Ext: extensive edema whole body, including all 4 extremities Musculoskeletal:  No deformities, strength not tested >> quadriplegic Skin: warm and dry; L arm w/ extensive ecchymosis, other areas of ecchymosis as well  Neuro:  CNs 2-12 intact, no new abnormalities noted  EKG:  The EKG was personally reviewed and demonstrates:   02/24 ECG is SR, HR 84, QRS duration 84 ms 02/28 ECG is V pacing, ?P waves, HR 80,  QRS duration 146 ms, LBBB 2nd pacing 03/06 ECG is V pacing, some p waves seen, HR 80, QRS duration 134 ms, LBBB morphology Telemetry:  Telemetry was personally reviewed and demonstrates:  SR, ST, no sig ectopy   CV studies:   UPPER EXTREMITY VENOUS DOPPLERS Summary:   Left:  No evidence of deep vein thrombosis in the upper extremity. Findings  consistent  with age indeterminate superficial vein thrombosis involving the left  cephalic vein.   ECHO: 04/30/2020 1. Left ventricular ejection fraction, by estimation, is 45 to 50%. The  left ventricle has mildly decreased function. The left ventricle  demonstrates global hypokinesis. Left ventricular diastolic parameters are  consistent with Grade II diastolic  dysfunction (pseudonormalization).  2. Right ventricular systolic function is moderately reduced.  3. The pericardial effusion is anterior to the right ventricle and  circumferential.   ECHO: 10/08/2019 2D DIMENSIONS  AORTA     Values   Normal RangeMAIN PA   Values   Normal Range    Annulus: nm* cm  [2 - 3.2]   PA Main: nm* cm  [1.5 - 2.1]   Aorta Sin:  3.7 cm  [2.8 - 4]  RIGHT VENTRICLE  ST Junction: nm* cm  [2.3 - 3.5]  RV Base:  4.9 cm  [2.5 - 4.1]   Asc.Aorta:  3.8 cm  [2.2 - 3.8]   RV Mid:  4.1 cm  [1.9 - 3.5]  LEFT  VENTRICLE             RV Length: nm* cm  _0      LVIDd:  4.2 cm  [4.2 - 5.8] RIGHT ATRIUM     LVIDs:  3.2 cm  [2.4 - 4]   RA Area: nm* cm2  [ <= 20]    LVEDVi: nm* ml/m2 [34 - 74]     RAVi: nm* ml/m2 [11 - 39]    LVESVi: nm* ml/m2 [11 - 31]  INFERIOR VENA CAVA      FS: 24  %   [ >= 25]    Max.IVC: nm* cm  [ <= 2.1]      SWT:  1.2 cm  [0.6 - 1]   Min.IVC: nm* cm  [ <= 1.7]      PWT:  1.2 cm  [0.6 - 1]  __________________  LEFT ATRIUM              nm* - not measured    LA Diam:  4.0 cm  [3 - 4]    LA Area: 25  cm2  [ <= 20]   LA Volume: nm* mL  [18 - 58]     LAVi: nm* ml/m2 [16 - 34]   ECHOCARDIOGRAPHIC DESCRIPTIONS -----------------------------------------------  AORTIC ROOT     Size: Normal  Dissection: INDETERM FOR DISSECTION   AORTIC VALVE   Leaflets: Tricuspid       Morphology: Normal   Mobility: Fully Mobile   LEFT VENTRICLE  Anterior: Normal     Size: Normal                 Lateral: Normal  Contraction: Normal                 Septal: Normal  Closest EF: >55% (Estimated)            Apical: Normal   LV masses: No Masses               Inferior: Normal      LVH: MILD LVH               Posterior: Normal  LV GLS(AVG): -15.5% Normal Range [ <= -16]  Dias.FxClass: N/A   MITRAL VALVE   Leaflets: Normal         Mobility: Fully mobile  Morphology: Normal   LEFT ATRIUM     Size: MILDLY ENLARGED   LA masses: No masses        Normal IAS   MAIN PA     Size: Not seen   PULMONIC VALVE   Mobility: Not seen   RIGHT VENTRICLE     Size: MILDLY ENLARGED      Free wall: HYPOCONTRACTILE  Contraction: MILD GLOBAL DECREASE   RV masses: CATHETER IN RV      TAPSE:  1.4 cm,Normal Range [>= 1.6 cm]   TRICUSPID VALVE   Leaflets: Normal         Mobility: Fully mobile  Morphology: Normal   RIGHT ATRIUM     Size: MILDLY ENLARGED      RA Other: None   RA masses: CATHETER IN RA   PERICARDIUM     Fluid: No effusion   INFERIOR VENACAVA     Size: DILATED  MECHANICAL VENTILATION IN USE   DOPPLER ECHO and OTHER SPECIAL PROCEDURES ------------------------------------   Aortic: No AR         No AS    Mitral: TRIVIAL Phillip       No MS   MV Inflow E Vel.= 76.0 cm/s MV Annulus E'Vel.= 11.0 cm/s E/E'Ratio= 7   Tricuspid: TRIVIAL TR       No TS   Pulmonary: N/A          No PS    Other:   INTERPRETATION ---------------------------------------------------------------  NORMAL LEFT VENTRICULAR SYSTOLIC FUNCTION WITH MILD LVH  MILD RV SYSTOLIC DYSFUNCTION (See above)  VALVULAR REGURGITATION: TRIVIAL Phillip, TRIVIAL TR  NO VALVULAR STENOSIS  INSUFFICIENT TR TO ESTIMATED RVSP    Compared with priorEcho study on 09/25/2019: NO SIGNIFICANT CHANGES    (Report version 3.0)          Interpreted and Electronically signed  Perform. by: Jonni Sanger Dhimitri, BS, RDCS       by: Birdena Jubilee Khouri    Laboratory Data:   Chemistry Recent Labs  Lab 05/09/20 0151 05/10/20 0100 05/11/20 0425  NA 137 137 137  K 4.1 4.4 4.4  CL 101 101 101  CO2 _0 GLUCOSE 124* 122* 125*  BUN 128* 141* 152*  CREATININE 2.39* 2.39* 2.30*  CALCIUM 8.6* 8.6* 9.0  GFRNONAA 32* 32* 33*  ANIONGAP _1 Albumin  Date Value Ref Range Status  05/11/2020 2.0 (L) 3.5 - 5.0 g/dL Final  05/10/2020 2.1 (L) 3.5 - 5.0 g/dL Final  05/09/2020 2.1 (L) 3.5 - 5.0 g/dL Final  05/08/2020 2.5 (L) 3.5 - 5.0 g/dL Final  05/07/2020 2.4 (L) 3.5 - 5.0 g/dL  Final  05/07/2020 2.4 (L) 3.5 - 5.0 g/dL Final    Lab Results  Component Value Date   ALT 20 05/11/2020   AST 36  05/11/2020   ALKPHOS 217 (H) 05/11/2020   BILITOT 0.6 05/11/2020   Hematology Recent Labs  Lab 05/08/20 0611 05/10/20 0100 05/10/20 1241 05/10/20 2131 05/11/20 0425 05/11/20 1230  WBC 6.6 6.4  --   --  7.7  --   RBC 2.51* 2.39*  --   --  2.39*  --   HGB 7.0* 6.8*   < > 6.8* 6.7* 10.0*  HCT 22.5* 22.3*   < > 22.3* 22.0* 32.7*  MCV 89.6 93.3  --   --  92.1  --   MCH 27.9 28.5  --   --  28.0  --   MCHC 31.1 30.5  --   --  30.5  --   RDW 16.1* 16.6*  --   --  16.7*  --   PLT 160 147*  --   --  139*  --    < > = values in this interval not displayed.   Cardiac Enzymes High Sensitivity Troponin:   Recent Labs  Lab 04/10/2020 1606 04/07/2020 2200  TROPONINIHS 15 31*      BNPNo results for input(s): BNP, PROBNP in the last 168 hours.  DDimer No results for input(s): DDIMER in the last 168 hours. TSH: No results found for: TSH Lipids:No results found for: CHOL, HDL, LDLCALC, LDLDIRECT, TRIG, CHOLHDL HgbA1c: Lab Results  Component Value Date   HGBA1C 5.1 04/28/2020   Magnesium:  Magnesium  Date Value Ref Range Status  05/11/2020 1.7 1.7 - 2.4 mg/dL Final    Comment:    Performed at [facility], [place] [address] [place], [place] [address]     Radiology/Studies:  [facility] Chest Port 1 View  Result Date: 05/08/2020 CLINICAL DATA:  Respiratory failure EXAM: PORTABLE CHEST 1 VIEW COMPARISON:  05/01/2020 FINDINGS: Tracheostomy is unchanged. Right internal jugular central venous catheter has been removed. Pulmonary insufflation is stable. Small bilateral pleural effusions persist with associated bibasilar compressive atelectasis. Mild cardiomegaly is stable. Trace interstitial pulmonary edema has developed. No pneumothorax. Implanted loop recorder and cervical fusion hardware again noted. IMPRESSION: Interval removal of right internal jugular central venous catheter. Stable small bilateral pleural effusions. Interval development of mild pulmonary edema, likely cardiogenic in nature.  Electronically Signed   By: Fidela Salisbury MD   On: 05/08/2020 07:12   VAS [place] UPPER EXTREMITY VENOUS DUPLEX  Result Date: 05/11/2020 UPPER VENOUS STUDY  Indications: Swelling Comparison Study: no prior Performing Technologist: Abram Sander RVS  Examination Guidelines: A complete evaluation includes B-mode imaging, spectral Doppler, color Doppler, and power Doppler as needed of all accessible portions of each vessel. Bilateral testing is considered an integral part of a complete examination. Limited examinations for reoccurring indications may be performed as noted.  Left Findings: +----------+------------+---------+-----------+----------+--------------+ LEFT      CompressiblePhasicitySpontaneousProperties   Summary     +----------+------------+---------+-----------+----------+--------------+ IJV                                                 Not visualized +----------+------------+---------+-----------+----------+--------------+ Subclavian    Full       Yes       Yes                             +----------+------------+---------+-----------+----------+--------------+  Axillary      Full       Yes       Yes                             +----------+------------+---------+-----------+----------+--------------+ Brachial      Full       Yes       Yes                             +----------+------------+---------+-----------+----------+--------------+ Radial        Full                                                 +----------+------------+---------+-----------+----------+--------------+ Ulnar         Full                                                 +----------+------------+---------+-----------+----------+--------------+ Cephalic      None                                                 +----------+------------+---------+-----------+----------+--------------+ Basilic       Full                                                  +----------+------------+---------+-----------+----------+--------------+  Summary:  Left: No evidence of deep vein thrombosis in the upper extremity. Findings consistent with age indeterminate superficial vein thrombosis involving the left cephalic vein.  *See table(s) above for measurements and observations.    Preliminary    [place] EKG SITE RITE  Result Date: 05/11/2020 If Site Rite image not attached, placement could not be confirmed due to current cardiac rhythm.   Assessment and Plan:   1. Acute on chronic CHF - Nephrology seeing, they have him on Lasix 120 mg IV BID - has gotten albumin in the past, level < 1.0 on admit - has gotten albumin, last time 03/06 - EF is slightly lower than previous, no WMA - I/O have been consistently +, despite increasing Lasix doses. - discuss w/ MD.  2. Hx MDT MICRA leadless PPM - will get it interrogated, he has been pacing for several days. - currently VVI 80 bpm  Otherwise, per CCM/IM/ID/Nephrology Principal Problem:   Gram-negative bacteremia Active Problems:   Septic shock (HCC)   Pressure injury of skin   Malnutrition of moderate degree   Encounter for feeding tube placement   Hypotension   Quadriplegia ([place])   Infection due to ESBL-producing [name] pneumoniae   Chronic hepatitis C without hepatic coma (HCC)   Anemia   Jejunostomy tube leak (HCC)   Pleural effusion     For questions or updates, please contact [facility] Please consult [email or web address] for contact info under Cardiology/STEMI.   Signed, Rosaria Ferries, PA-C  05/11/2020 4:21 PM   I have seen and examined the patient along  with Rosaria Ferries, PA-C.  I have reviewed the chart, notes and new data.  I agree with PA/NP's note.  Key new complaints: very difficult to understand anything he is trying to say. Key examination changes: anasarca, RRR, no murmurs Key new findings / data: has V-paced rhythm with apparent retrograde VA conduction, but ECG on 2/24 showed  sinus rhythm at 85 bpm with normal AV conduction and narrow QRS.  PLAN: Anasarca is due to severe malnutrition/hypoalbuminemia, aggressive resuscitation for sepsis and residual effect of fludrocortisone (which will last for weeks after discontinuation), renal insufficiency. There may be a minor contribution of left ventricular dysfunction due apical RV pacing induced dyssynchrony. Will plan to reprogram his pacemaker to a lower rate to allow more native AV conduction and synchronous LV activation. Despite decent UO and apparent response to loop diuretics, he remains net fluid +ve due to large volume of IV fluids. Discussed w Dr. Joylene Grapes. Will administer a one time dose of thiazide diuretic and watch for response.  Sanda Klein, MD, Grandfather [phone] 05/11/2020, 4:43 PM

## 2020-05-11 NOTE — Progress Notes (Signed)
eLink Physician-Brief Progress Note Patient Name: Phillip Klein DOB: December 04, 1965 MRN: [id number]   Date of Service  05/11/2020  HPI/Events of Note  Patient c/o chest pain and SOB. Sat = 100% on 60% FiO2.  eICU Interventions  Plan: 1. EKG STAT. 2. Cycle Troponin  3. Portable CXR STAT. 4. ABG STAT.  5. Increase Klonopin to 0.5 mg per tube Q 6 hours PRN.      Intervention Category Major Interventions: Other:  [name] 05/11/2020, 8:10 PM

## 2020-05-11 NOTE — Progress Notes (Signed)
MD ordered PICC line placement and gave verbal order to draw post-transfusion H/H from that line instead of a peripheral stick due to questionable accuracy of past H/H results.   Jayme Cloud, RN

## 2020-05-11 NOTE — Progress Notes (Signed)
Nutrition Follow-up  DOCUMENTATION CODES:   Non-severe (moderate) malnutrition in context of chronic illness  INTERVENTION:   Continue tube feeds via J-port:   - Osmolite 1.5 @ 60 ml/hr (1440 ml/day)  - ProSource TF 90 ml TID   Provides 2400 kcal, 156 grams of protein, and 1097 ml of H2O.   -Continue Juven 1 packet BID per tube. Each packet provides 95 kcal, 7 grams L-Arginine, 7 grams L-Glutamine, 2.5 grams collagen protein, 300 mg vitamin C, 9.5 mg zinc, and other micronutrients essential for wound healing  -continue MVI with minerals via tube daily  NUTRITION DIAGNOSIS:   Moderate Malnutrition related to chronic illness (CHF, cirrhosis) as evidenced by moderate fat depletion,severe muscle depletion.  Ongoing.  GOAL:   Patient will meet greater than or equal to 90% of their needs  Met with tube feed.  MONITOR:   Vent status,Labs,Weight trends,TF tolerance,Skin,I & O's  REASON FOR ASSESSMENT:   Ventilator,Consult Enteral/tube feeding initiation and management  ASSESSMENT:   55 year old male who presented to the ED on 2/24 from [facility] with hypotension, fever. Pt was scheduled to have revision of J-tube on 2/24 and was found to be hypotensive. PMH of vent dependence via trach, quadriplegia after cervical spine surgery in summer of 2021, s/p ex-lap for G-tube malfunction, multiple pressure injuries, GERD, T2DM, anemia, CHF, previous IVDA, cirrhosis. Pt admitted with septic shock.  Patient has a PEG-J tube. Awaiting vent SNF bed placement. Spoke with RN who reports that pt has been tolerating his tube feeds, but has complained of some nausea. Pt and RN report no other GI distress.   Patient is on chronic ventilatory support via trach.  MV: 11.6 L/min Temp (24hrs), Avg:97.7 F (36.5 C), Min:97.4 F (36.3 C), Max:98.2 F (36.8 C) MAP: range of 73-94 since this morning  Propofol: none  Pt's weights reviewed with a steady increase since admission. However, difficult to  determine dry weight given edema and lack of weight history in the chart.  Admit weight: 236.99#  Current weight: 252.43#    Meds reviewed: Lasix Labs reviewed: Corrected calcium (10.6), Phosphorus (7.5, high), Iron (33, low), CBG (118-138)  UOP: 925 mL x 24 hrs Stool output: 725 mL x 24 hours  I&O's: +10,646.7 L since admit  Diet Order:   Diet Order            Diet NPO time specified  Diet effective now                 EDUCATION NEEDS:   No education needs have been identified at this time  Skin:  Skin Assessment: Skin Integrity Issues: Skin Integrity Issues:: Incisions DTI: R leg Stage I: R leg Stage III: L elbow Stage IV: vertebral column, sacrum, bilateral ischial tuberosity, R hip, R lower leg Unstageable: L leg x 2, R leg x 3, R elbow, L arm Incisions: closed abdomen Other: skin tear right arm  Last BM:  3/07 (type 7) colostomy  Height:   Ht Readings from Last 1 Encounters:  05/10/20 '6\' 2"'  (1.88 m)    Weight:   Wt Readings from Last 1 Encounters:  05/11/20 114.5 kg    Ideal Body Weight:  86.4 kg  BMI:  Body mass index is 32.41 kg/m.  Estimated Nutritional Needs:   Kcal:  8871-95974  Protein:  150-170 grams  Fluid:  >/= 2.0 L    Salvadore Oxford, Dietetic Intern 05/11/2020 2:03 PM

## 2020-05-11 NOTE — Progress Notes (Signed)
Upper extremity venous has been completed.   Preliminary results in CV Proc.   Blanch Media 05/11/2020 9:56 AM

## 2020-05-11 NOTE — Progress Notes (Addendum)
PROGRESS NOTE    Phillip Klein  UJW:[id number] DOB: January 27, 1966 DOA: 04/17/2020 PCP: [name], MD   Brief Narrative:  This 55 years old male with PMH significant for chronic hypoxic respiratory failure with tracheostomy and vent since 2021 after C-spine surgery, GERD, DM, anemia of chronic disease, quadriplegia, malnutrition, neurogenic orthostatic hypotension, untreated HCV, previous IV drug use, cirrhosis, chronic pain syndrome was sent from [facility] with septic shock.  Patient admitted in the ICU with septic shock secondary to ESBL/ [name] bacteremia.  Patient had Hickman's catheter which was removed,  presumed to be the source of infection.  Patient is getting meropenem.  Patient is also having acute kidney injury which is recovering,  nephrology is following.  Assessment & Plan:   Principal Problem:   Gram-negative bacteremia Active Problems:   Septic shock (HCC)   Pressure injury of skin   Malnutrition of moderate degree   Encounter for feeding tube placement   Hypotension   Quadriplegia ([place])   Infection due to ESBL-producing [name] pneumoniae   Chronic hepatitis C without hepatic coma (HCC)   Anemia   Jejunostomy tube leak (HCC)   Pleural effusion   Septic shock sec to [name] bacteremia from UTI vs line infection: Patient presented with septic shock at admission. Hickman catheter removed, presumed to be the source of infection. Blood cultures grew [name] bacteremia. UA + Continue meropenem for 10 days from when lines were DC'd until 3/9, repeat blood cultures negative. Patient remains afebrile, without leukocytosis. Plan is to receive PICC line once antibiotics completed. PICC line ordered to be placed. Sepsis physiology is improving.  AKI most likely ATN in setting of septic shock / Anasarca: Nephrology is following.  There is no urgent indication for dialysis at this time. Patient has received trials of albumin plus Lasix which was  not effective. Nephrology recommended Lasix 120 mg every 12 hours.   Monitor BMP and urine output.  CHF Exacerbation (Combined systolic and diastolic): Patient remains with generalized body swelling, Echocardiogram LVEF 40 to 45%.  Global hypokinesis. Continue Lasix 120 mg every 12 hours as per nephro. Cardiology consulted for recommendation.  Jejunostomy tube drainage> improved. Continue feeding through J tube. Appears to be at goal  Hypotension likely related to orthostatic hypotension. Florinef was discontinued in the ICU, probably contributing to water retention. Midodrine has been stopped. BP been improving.  Chronic vent dependent hypoxic respiratory failure: Due to quadriplegia Continue current ventilatory support with trach.  Appears at his baseline per PCCM.   Multiple pressure ulcers/wounds present on admission: Continue Wound care  Chronic pain syndrome: Continue fentanyl patch Oxycodone to prn  Depression, insomnia Continue Sertraline, trazodone. Continue clonazepam  GERD  Continue Pantroprazole  Anemia of chronic disease HGB drop to 6.8 from 7.1 Transfuse for HGB < 7, 2 U given Post transfusion Hb , still remains 6.7 No evidence of bleeding. Check stool occult blood.  Left Arm swelling: Obtain left upper extremity venous duplex. Remove peripheral line from the left arm.    DVT prophylaxis: Heparin sq Code Status: Full code. Family Communication:  No family at bed side. Disposition Plan:    Status is: Inpatient  Remains inpatient appropriate because:Inpatient level of care appropriate due to severity of illness   Dispo: The patient is from: SNF              Anticipated d/c is to: LTAC              Patient currently is not medically stable to d/c.  Difficult to place patient No  Consultants:   PCCM.  Procedures: Hickman removed.  Antimicrobials:  Anti-infectives (From admission, onward)   Start     Dose/Rate Route Frequency  Ordered Stop   05/11/20 0800  meropenem (MERREM) 1 g in sodium chloride 0.9 % 100 mL IVPB        1 g 200 mL/hr over 30 Minutes Intravenous Every 8 hours 05/11/20 0707 05/12/20 2359   05/11/20 0715  meropenem (MERREM) 1 g in sodium chloride 0.9 % 100 mL IVPB  Status:  Discontinued        1 g 200 mL/hr over 30 Minutes Intravenous Every 8 hours 05/11/20 0706 05/11/20 0707   05/03/20 2200  meropenem (MERREM) 1 g in sodium chloride 0.9 % 100 mL IVPB  Status:  Discontinued        1 g 200 mL/hr over 30 Minutes Intravenous Every 12 hours 05/03/20 1041 05/11/20 0706   04/30/20 1145  meropenem (MERREM) 2 g in sodium chloride 0.9 % 100 mL IVPB  Status:  Discontinued        2 g 200 mL/hr over 30 Minutes Intravenous Every 12 hours 04/30/20 1052 05/03/20 1041   04/30/20 0530  ceFEPIme (MAXIPIME) 2 g in sodium chloride 0.9 % 100 mL IVPB  Status:  Discontinued        2 g 200 mL/hr over 30 Minutes Intravenous Every 12 hours 04/30/2020 1852 04/30/20 1051   04/09/2020 1852  vancomycin variable dose per unstable renal function (pharmacist dosing)  Status:  Discontinued         Does not apply See admin instructions 04/16/2020 1852 04/30/20 1051   05/02/2020 1630  vancomycin (VANCOREADY) IVPB 2000 mg/400 mL        2,000 mg 200 mL/hr over 120 Minutes Intravenous  Once 04/26/2020 1621 04/20/2020 2008   04/10/2020 1630  ceFEPIme (MAXIPIME) 2 g in sodium chloride 0.9 % 100 mL IVPB        2 g 200 mL/hr over 30 Minutes Intravenous  Once 04/10/2020 1621 04/16/2020 1749      Subjective: Patient was seen and examined at bedside.  Overnight events noted.  Patient seems alert and oriented, He talks in a husky voice.  Patient has a tracheostomy, on the vent with full mechanical support.  Patient's left arm is swollen, will get PICC line.  Patient reports not able to breathe but his O2 saturation is 92%.  Objective: Vitals:   05/11/20 0845 05/11/20 0900 05/11/20 1000 05/11/20 1040  BP: (!) 144/69 134/71 126/73   Pulse: 80 83 80    Resp: _0 Temp: 97.8 F (36.6 C)     TempSrc: Axillary     SpO2: 95% 92% 100% 100%  Weight:      Height:        Intake/Output Summary (Last 24 hours) at 05/11/2020 1101 Last data filed at 05/11/2020 1000 Gross per 24 hour  Intake 3535.49 ml  Output 1260 ml  Net 2275.49 ml   Filed Weights   05/09/20 0500 05/10/20 0500 05/11/20 0500  Weight: 112.5 kg 118 kg 114.5 kg    Examination:  General exam: Appears calm and comfortable, chronically ill-appearing.  Not in any distress.  Patient has tracheostomy tube. Respiratory system: Clear to auscultation. Respiratory effort normal. Cardiovascular system: S1 & S2 heard, RRR. No JVD, murmurs, rubs, gallops or clicks. No pedal edema. Gastrointestinal system: Abdomen is nondistended, soft and nontender. No organomegaly or masses felt.  Normal bowel sounds heard.  Central nervous system: Alert and oriented.  Extremities: 2+ pitting edema.  No cyanosis no clubbing. Skin: No rashes, lesions or ulcers Psychiatry: Not assessed.     Data Reviewed: I have personally reviewed following labs and imaging studies  CBC: Recent Labs  Lab 05/06/20 0353 05/07/20 0730 05/08/20 0611 05/10/20 0100 05/10/20 1241 05/10/20 2131 05/11/20 0425  WBC 7.8 7.2 6.6 6.4  --   --  7.7  NEUTROABS  --  4.1  --   --   --   --   --   HGB 7.1* 7.0* 7.0* 6.8* 6.9* 6.8* 6.7*  HCT 22.9* 22.8* 22.5* 22.3* 21.9* 22.3* 22.0*  MCV 90.2 89.4 89.6 93.3  --   --  92.1  PLT 158 148* 160 147*  --   --  149*   Basic Metabolic Panel: Recent Labs  Lab 05/05/20 0210 05/06/20 0353 05/07/20 0730 05/08/20 0611 05/09/20 0151 05/10/20 0100 05/11/20 0425  NA 139   < > 139  136 137 137 137 137  K 3.6   < > 3.9  3.8 3.9 4.1 4.4 4.4  CL 106   < > 103  101 100 101 101 101  CO2 17*   < > 22  21* _0 GLUCOSE 132*   < > 117*  114* 120* 124* 122* 125*  BUN 108*   < > 136*  136* 130* 128* 141* 152*  CREATININE 2.77*   < > 2.62*  2.61* 2.42* 2.39* 2.39*  2.30*  CALCIUM 8.5*   < > 8.7*  8.6* 8.7* 8.6* 8.6* 9.0  MG 1.9  --   --  1.8  --  1.8 1.7  PHOS  --    < > 6.0* 6.0* 6.4* 7.4*  7.3* 7.5*   < > = values in this interval not displayed.   GFR: Estimated Creatinine Clearance: 50.5 mL/min (A) (by C-G formula based on SCr of 2.3 mg/dL (H)). Liver Function Tests: Recent Labs  Lab 05/07/20 0730 05/08/20 0611 05/09/20 0151 05/10/20 0100 05/11/20 0425  AST 22 36  --   --  36  ALT 12 16  --   --  20  ALKPHOS 158* 191*  --   --  217*  BILITOT 0.8 1.0  --   --  0.6  PROT 6.3* 6.1*  --   --  6.3*  ALBUMIN 2.4*  2.4* 2.5* 2.1* 2.1* 2.0*   No results for input(s): LIPASE, AMYLASE in the last 168 hours. No results for input(s): AMMONIA in the last 168 hours. Coagulation Profile: Recent Labs  Lab 05/11/20 0637  INR 1.2   Cardiac Enzymes: Recent Labs  Lab 05/04/20 1701  CKTOTAL 8*   BNP (last 3 results) No results for input(s): PROBNP in the last 8760 hours. HbA1C: No results for input(s): HGBA1C in the last 72 hours. CBG: Recent Labs  Lab 05/10/20 1653 05/10/20 1954 05/11/20 0104 05/11/20 0430 05/11/20 0734  GLUCAP 123* 138* 118* 121* 123*   Lipid Profile: No results for input(s): CHOL, HDL, LDLCALC, TRIG, CHOLHDL, LDLDIRECT in the last 72 hours. Thyroid Function Tests: No results for input(s): TSH, T4TOTAL, FREET4, T3FREE, THYROIDAB in the last 72 hours. Anemia Panel: Recent Labs    05/11/20 0425  TIBC 118*  IRON 33*   Sepsis Labs: No results for input(s): PROCALCITON, LATICACIDVEN in the last 168 hours.  Recent Results (from the past 240 hour(s))  Urine Culture     Status: Abnormal   Collection Time: 05/01/20  3:42 PM   Specimen: Urine, Random  Result Value Ref Range Status   Specimen Description URINE, RANDOM  Final   Special Requests   Final    NONE Performed at [facility], [address] [place], [place] [address]    Culture MULTIPLE SPECIES PRESENT, SUGGEST RECOLLECTION (A)  Final    Report Status 05/02/2020 FINAL  Final  Culture, blood (Routine X 2) w Reflex to ID Panel     Status: None (Preliminary result)   Collection Time: 05/08/20 12:44 PM   Specimen: BLOOD RIGHT HAND  Result Value Ref Range Status   Specimen Description BLOOD RIGHT HAND  Final   Special Requests   Final    BOTTLES DRAWN AEROBIC ONLY Blood Culture results may not be optimal due to an excessive volume of blood received in culture bottles   Culture   Final    NO GROWTH 2 DAYS Performed at [facility], [place] [address] [place], [place] [address]    Report Status PENDING  Incomplete  Culture, blood (Routine X 2) w Reflex to ID Panel     Status: None (Preliminary result)   Collection Time: 05/08/20 12:45 PM   Specimen: BLOOD LEFT HAND  Result Value Ref Range Status   Specimen Description BLOOD LEFT HAND  Final   Special Requests   Final    BOTTLES DRAWN AEROBIC ONLY Blood Culture results may not be optimal due to an excessive volume of blood received in culture bottles   Culture   Final    NO GROWTH 2 DAYS Performed at [facility], [address], [place], [place] [address]    Report Status PENDING  Incomplete         Radiology Studies: VAS [place] UPPER EXTREMITY VENOUS DUPLEX  Result Date: 05/11/2020 UPPER VENOUS STUDY  Indications: Swelling Comparison Study: no prior Performing Technologist: Abram Sander RVS  Examination Guidelines: A complete evaluation includes B-mode imaging, spectral Doppler, color Doppler, and power Doppler as needed of all accessible portions of each vessel. Bilateral testing is considered an integral part of a complete examination. Limited examinations for reoccurring indications may be performed as noted.  Left Findings: +----------+------------+---------+-----------+----------+--------------+ LEFT      CompressiblePhasicitySpontaneousProperties   Summary     +----------+------------+---------+-----------+----------+--------------+ IJV                                                  Not visualized +----------+------------+---------+-----------+----------+--------------+ Subclavian    Full       Yes       Yes                             +----------+------------+---------+-----------+----------+--------------+ Axillary      Full       Yes       Yes                             +----------+------------+---------+-----------+----------+--------------+ Brachial      Full       Yes       Yes                             +----------+------------+---------+-----------+----------+--------------+ Radial  Full                                                 +----------+------------+---------+-----------+----------+--------------+ Ulnar         Full                                                 +----------+------------+---------+-----------+----------+--------------+ Cephalic      None                                                 +----------+------------+---------+-----------+----------+--------------+ Basilic       Full                                                 +----------+------------+---------+-----------+----------+--------------+  Summary:  Left: No evidence of deep vein thrombosis in the upper extremity. Findings consistent with age indeterminate superficial vein thrombosis involving the left cephalic vein.  *See table(s) above for measurements and observations.    Preliminary    [place] EKG SITE RITE  Result Date: 05/11/2020 If Site Rite image not attached, placement could not be confirmed due to current cardiac rhythm.       Scheduled Meds: . chlorhexidine gluconate (MEDLINE KIT)  15 mL Mouth Rinse BID  . Chlorhexidine Gluconate Cloth  6 each Topical Daily  . darbepoetin (ARANESP) injection - NON-DIALYSIS  60 mcg Subcutaneous Q Mon-1800  . feeding supplement (PROSource TF)  90 mL Per Tube TID  . fentaNYL  1 patch Transdermal Q72H   And  . fentaNYL  1 patch Transdermal Q72H  . insulin  aspart  0-9 Units Subcutaneous Q4H  . levothyroxine  125 mcg Per Tube Q0600  . mouth rinse  15 mL Mouth Rinse 10 times per day  . multivitamin with minerals  1 tablet Per Tube Daily  . nutrition supplement (JUVEN)  1 packet Per Tube BID BM  . pantoprazole sodium  40 mg Per Tube Daily  . sertraline  100 mg Per Tube Daily  . sodium chloride flush  10-40 mL Intracatheter Q12H  . traZODone  100 mg Per Tube QHS   Continuous Infusions: . sodium chloride Stopped (04/24/2020 1808)  . feeding supplement (OSMOLITE 1.5 CAL) 1,000 mL (05/10/20 1657)  . furosemide Stopped (05/11/20 0959)  . meropenem (MERREM) IV Stopped (05/11/20 0931)     LOS: 12 days    Time spent: 35 mins.   Shawna Clamp, MD [facility]   If 7PM-7AM, please contact night-coverage

## 2020-05-11 NOTE — Progress Notes (Signed)
Patient refusing most of his wound dressing changes. Around 1200 patient allowed this RN to change abdominal dressing and J/G tube dressing as well as bathing the front of his body. When this RN asked to proceed with the legs and back side dressings/bath the patient states "I need to take a break." When encouraged to continue on he states "I won't tell you again." Patient was given a break. At 1400 the patient was encouraged to allow this RN to change the rest of the dressings and finish the bath, patient refused at that time stating he wanted to "try later." At 1600 the patient was encouraged again to complete the wound dressing changes and he states "I refuse." Patient was educated on the importance of ensuring the dressings get changed every day as they are ordered to prevent infection and other complications. Patient was also notified that dressings are leaking and getting the sheets and bed wet. Patient continues to refuse after education. MD notified.   Jayme Cloud

## 2020-05-12 ENCOUNTER — Inpatient Hospital Stay (HOSPITAL_COMMUNITY): Payer: Medicare Other

## 2020-05-12 DIAGNOSIS — Z950 Presence of cardiac pacemaker: Secondary | ICD-10-CM | POA: Diagnosis not present

## 2020-05-12 DIAGNOSIS — A498 Other bacterial infections of unspecified site: Secondary | ICD-10-CM | POA: Diagnosis not present

## 2020-05-12 DIAGNOSIS — I455 Other specified heart block: Secondary | ICD-10-CM

## 2020-05-12 DIAGNOSIS — R7881 Bacteremia: Secondary | ICD-10-CM | POA: Diagnosis not present

## 2020-05-12 DIAGNOSIS — A4159 Other Gram-negative sepsis: Secondary | ICD-10-CM | POA: Diagnosis not present

## 2020-05-12 DIAGNOSIS — I5020 Unspecified systolic (congestive) heart failure: Secondary | ICD-10-CM | POA: Diagnosis not present

## 2020-05-12 DIAGNOSIS — I959 Hypotension, unspecified: Secondary | ICD-10-CM | POA: Diagnosis not present

## 2020-05-12 LAB — RENAL FUNCTION PANEL
Albumin: 1.9 g/dL — ABNORMAL LOW (ref 3.5–5.0)
Anion gap: 10 (ref 5–15)
BUN: 158 mg/dL — ABNORMAL HIGH (ref 6–20)
CO2: 25 mmol/L (ref 22–32)
Calcium: 8.7 mg/dL — ABNORMAL LOW (ref 8.9–10.3)
Chloride: 101 mmol/L (ref 98–111)
Creatinine, Ser: 2.2 mg/dL — ABNORMAL HIGH (ref 0.61–1.24)
GFR, Estimated: 35 mL/min — ABNORMAL LOW (ref >60)
Glucose, Bld: 119 mg/dL — ABNORMAL HIGH (ref 70–99)
Phosphorus: 7.7 mg/dL — ABNORMAL HIGH (ref 2.5–4.6)
Potassium: 4.4 mmol/L (ref 3.5–5.1)
Sodium: 136 mmol/L (ref 135–145)

## 2020-05-12 LAB — BPAM RBC
Blood Product Expiration Date: 202203302359
Blood Product Expiration Date: 202203312359
Blood Product Expiration Date: 202204052359
Blood Product Expiration Date: 202204052359
ISSUE DATE / TIME: 202203070336
ISSUE DATE / TIME: 202203071628
ISSUE DATE / TIME: 202203072347
ISSUE DATE / TIME: 202203080636
Unit Type and Rh: 7300
Unit Type and Rh: 7300
Unit Type and Rh: 7300
Unit Type and Rh: 7300

## 2020-05-12 LAB — TYPE AND SCREEN
ABO/RH(D): B POS
Antibody Screen: NEGATIVE
Unit division: 0
Unit division: 0
Unit division: 0
Unit division: 0

## 2020-05-12 LAB — CBC
HCT: 23.3 % — ABNORMAL LOW (ref 39.0–52.0)
Hemoglobin: 7.2 g/dL — ABNORMAL LOW (ref 13.0–17.0)
MCH: 28.5 pg (ref 26.0–34.0)
MCHC: 30.9 g/dL (ref 30.0–36.0)
MCV: 92.1 fL (ref 80.0–100.0)
Platelets: 156 10*3/uL (ref 150–400)
RBC: 2.53 MIL/uL — ABNORMAL LOW (ref 4.22–5.81)
RDW: 16.8 % — ABNORMAL HIGH (ref 11.5–15.5)
WBC: 7.2 10*3/uL (ref 4.0–10.5)
nRBC: 0 % (ref 0.0–0.2)

## 2020-05-12 LAB — GLUCOSE, CAPILLARY
Glucose-Capillary: 116 mg/dL — ABNORMAL HIGH (ref 70–99)
Glucose-Capillary: 120 mg/dL — ABNORMAL HIGH (ref 70–99)
Glucose-Capillary: 126 mg/dL — ABNORMAL HIGH (ref 70–99)
Glucose-Capillary: 124 mg/dL — ABNORMAL HIGH (ref 70–99)
Glucose-Capillary: 120 mg/dL — ABNORMAL HIGH (ref 70–99)
Glucose-Capillary: 122 mg/dL — ABNORMAL HIGH (ref 70–99)

## 2020-05-12 LAB — MAGNESIUM: Magnesium: 1.8 mg/dL (ref 1.7–2.4)

## 2020-05-12 MED ORDER — ALBUTEROL SULFATE (2.5 MG/3ML) 0.083% IN NEBU: 2.5000 mg | INHALATION_SOLUTION | RESPIRATORY_TRACT | Status: DC | PRN

## 2020-05-12 MED ORDER — CLONAZEPAM 0.5 MG PO TBDP
1.0000 mg | ORAL_TABLET | Freq: Three times a day (TID) | ORAL | Status: DC
  Administered 2020-05-12 – 2020-05-17 (×16): 1 mg
  Filled 2020-05-12 (×16): qty 2

## 2020-05-12 MED ORDER — FUROSEMIDE 10 MG/ML IJ SOLN
120.0000 mg | Freq: Two times a day (BID) | INTRAVENOUS | Status: DC
  Administered 2020-05-12 (×2): 120 mg via INTRAVENOUS
  Filled 2020-05-12: qty 12
  Filled 2020-05-12 (×2): qty 10
  Filled 2020-05-12: qty 12

## 2020-05-12 MED ORDER — METOLAZONE 2.5 MG PO TABS
5.0000 mg | ORAL_TABLET | Freq: Every day | ORAL | Status: DC
  Administered 2020-05-12 – 2020-05-13 (×2): 5 mg
  Filled 2020-05-12 (×2): qty 2

## 2020-05-12 NOTE — NC FL2 (Signed)
[place] LEVEL OF CARE SCREENING TOOL     IDENTIFICATION  Patient Name: Phillip Klein Birthdate: [birth year] Sex: male Admission Date (Current Location): 04/27/2020  [facility] and [place] Number:  Herbalist and Address:  [facility] [place], [place] [address] [place], [place] [address]      Provider Number: [id number]  Attending Physician Name and Address:  Shawna Clamp, MD  Relative Name and Phone Number:       Current Level of Care: Hospital Recommended Level of Care: Vent SNF Prior Approval Number: Goro Wenrick (wife)  [phone]  Date Approved/Denied:   PASRR Number: [id number]  Discharge Plan: Other (Comment) (snf/ vent)    Current Diagnoses: Patient Active Problem List   Diagnosis Date Noted  . Sinus arrest   . Pacemaker   . HFrEF (heart failure with reduced ejection fraction) ([place])   . Anemia   . Jejunostomy tube leak ([place])   . Pleural effusion   . Quadriplegia ([place]) 05/01/2020  . Gram-negative bacteremia 05/01/2020  . Infection due to ESBL-producing [name] pneumoniae 05/01/2020  . Chronic hepatitis C without hepatic coma ([place]) 05/01/2020  . Encounter for feeding tube placement   . Hypotension   . Pressure injury of skin 04/30/2020  . Malnutrition of moderate degree 04/30/2020  . Septic shock ([place]) 04/15/2020    Orientation RESPIRATION BLADDER Height & Weight     Self,Time,Situation,Place  Vent,Tracheostomy Indwelling catheter Weight: 121 kg Height:  '6\' 2"'  (188 cm)  BEHAVIORAL SYMPTOMS/MOOD NEUROLOGICAL BOWEL NUTRITION STATUS      Colostomy (LUQ) Feeding tube  AMBULATORY STATUS COMMUNICATION OF NEEDS Skin   Total Care Non-Verbally (Trach/vent) PU Stage and Appropriate Care (pt with multi wounds ,please refer to [place] note)                       Personal Care Assistance Level of Assistance  Bathing,Feeding,Dressing Bathing Assistance: Maximum assistance Feeding assistance: Maximum assistance (Tube  feeding, peg / jejunostomy) Dressing Assistance: Maximum assistance Total Care Assistance: Maximum assistance   Functional Limitations Info  Sight,Hearing,Speech Sight Info: Adequate Hearing Info: Adequate Speech Info: Impaired (trach)    SPECIAL CARE FACTORS FREQUENCY  PT (By licensed PT),OT (By licensed OT)     PT Frequency: 5x/weak, evaluate and treat OT Frequency: 5x/weak, evaluate and treat            Contractures Contractures Info: Not present    Additional Factors Info  Code Status,Allergies Code Status Info: full code Allergies Info: Acetaminophen, gabapentin           Current Medications (05/12/2020):  This is the current hospital active medication list Current Facility-Administered Medications  Medication Dose Route Frequency Provider Last Rate Last Admin  . 0.9 %  sodium chloride infusion  10 mL/hr Intravenous Once Emeline Darling, PA-C   Held at 04/13/2020 3710  . albuterol (PROVENTIL) (2.5 MG/3ML) 0.083% nebulizer solution 2.5 mg  2.5 mg Nebulization Q4H PRN Collene Gobble, MD      . amLODipine (NORVASC) tablet 5 mg  5 mg Per Tube Daily Shawna Clamp, MD   5 mg at 05/12/20 0919  . chlorhexidine gluconate (MEDLINE KIT) (PERIDEX) 0.12 % solution 15 mL  15 mL Mouth Rinse BID Hunsucker, Bonna Gains, MD   15 mL at 05/11/20 2038  . Chlorhexidine Gluconate Cloth 2 % PADS 6 each  6 each Topical Daily Hunsucker, Bonna Gains, MD   6 each at 05/11/20 1344  . clonazePAM (KLONOPIN) disintegrating  tablet 1 mg  1 mg Per Tube TID Collene Gobble, MD   1 mg at 05/12/20 1206  . Darbepoetin Alfa (ARANESP) injection 60 mcg  60 mcg Subcutaneous Q Mon-1800 Reesa Chew, MD   60 mcg at 05/10/20 1816  . docusate (COLACE) 50 MG/5ML liquid 100 mg  100 mg Per Tube BID PRN Simonne Maffucci B, MD      . feeding supplement (OSMOLITE 1.5 CAL) liquid 1,000 mL  1,000 mL Per Tube Continuous Juanito Doom, MD 60 mL/hr at 05/12/20 0400 Infusion Verify at 05/12/20 0400  . feeding  supplement (PROSource TF) liquid 90 mL  90 mL Per Tube TID Simonne Maffucci B, MD   90 mL at 05/12/20 0919  . fentaNYL (DURAGESIC) 75 MCG/HR 1 patch  1 patch Transdermal Q72H Ogan, Okoron[place]wo U, MD   1 patch at 05/12/20 0000   And  . fentaNYL (DURAGESIC) 75 MCG/HR 1 patch  1 patch Transdermal Q72H Frederik Pear, MD   1 patch at 05/12/20 0017  . fentaNYL (SUBLIMAZE) injection 25 mcg  25 mcg Intravenous Q3H PRN Corey Harold, NP   25 mcg at 05/12/20 1206  . furosemide (LASIX) 120 mg in dextrose 5 % 50 mL IVPB  120 mg Intravenous BID Reesa Chew, MD 62 mL/hr at 05/12/20 0903 120 mg at 05/12/20 0903  . heparin injection 5,000 Units  5,000 Units Subcutaneous Q8H Shawna Clamp, MD   5,000 Units at 05/12/20 1510  . insulin aspart (novoLOG) injection 0-9 Units  0-9 Units Subcutaneous Q4H Cristal Generous, NP   1 Units at 05/12/20 1206  . levothyroxine (SYNTHROID) tablet 125 mcg  125 mcg Per Tube [id number] Juanito Doom, MD   125 mcg at 05/12/20 0625  . MEDLINE mouth rinse  15 mL Mouth Rinse 10 times per day Lanier Clam, MD   15 mL at 05/12/20 1208  . meropenem (MERREM) 1 g in sodium chloride 0.9 % 100 mL IVPB  1 g Intravenous Q8H Shawna Clamp, MD 200 mL/hr at 05/12/20 1023 1 g at 05/12/20 1023  . metolazone (ZAROXOLYN) tablet 5 mg  5 mg Per Tube Daily Reesa Chew, MD   5 mg at 05/12/20 0919  . multivitamin with minerals tablet 1 tablet  1 tablet Per Tube Daily Juanito Doom, MD   1 tablet at 05/12/20 0919  . nutrition supplement (JUVEN) (JUVEN) powder packet 1 packet  1 packet Per Tube BID BM Juanito Doom, MD   1 packet at 05/12/20 1518  . oxyCODONE (Oxy IR/ROXICODONE) immediate release tablet 5 mg  5 mg Per Tube Q6H PRN Juanito Doom, MD   5 mg at 05/12/20 1205  . pantoprazole sodium (PROTONIX) 40 mg/20 mL oral suspension 40 mg  40 mg Per Tube Daily Juanito Doom, MD   40 mg at 05/12/20 0941  . polyethylene glycol (MIRALAX / GLYCOLAX) packet 17 g  17 g Per  Tube Daily PRN Juanito Doom, MD   17 g at 05/07/20 2694  . prochlorperazine (COMPAZINE) injection 10 mg  10 mg Intravenous Q6H PRN Anders Simmonds, MD   10 mg at 05/12/20 1514  . sertraline (ZOLOFT) tablet 100 mg  100 mg Per Tube Daily Simonne Maffucci B, MD   100 mg at 05/12/20 0918  . sodium chloride flush (NS) 0.9 % injection 10-40 mL  10-40 mL Intracatheter Q12H Juanito Doom, MD   10 mL at 05/12/20 0943  . sodium  chloride flush (NS) 0.9 % injection 10-40 mL  10-40 mL Intracatheter PRN Simonne Maffucci B, MD      . sodium chloride flush (NS) 0.9 % injection 10-40 mL  10-40 mL Intracatheter Q12H Shawna Clamp, MD   10 mL at 05/12/20 0941  . sodium chloride flush (NS) 0.9 % injection 10-40 mL  10-40 mL Intracatheter PRN Shawna Clamp, MD      . traZODone (DESYREL) tablet 100 mg  100 mg Per Tube QHS Juanito Doom, MD   100 mg at 05/11/20 2153     Discharge Medications: Please see discharge summary for a list of discharge medications.  Relevant Imaging Results:  Relevant Lab Results:   Additional Information SS# [id number]  Sharin Mons, RN

## 2020-05-12 NOTE — Progress Notes (Signed)
Patient is still refusing to be turned and is also refusing his wound care. Explained the reasons to change his dressings consistently but he still refused.

## 2020-05-12 NOTE — Progress Notes (Signed)
Progress Note  Patient Name: Phillip Klein Date of Encounter: 05/12/2020  [facility] Cardiologist: Sanda Klein, MD   Subjective   Improved UO, finally with negative fluid balance 1.2 l /24h, still marked volume overload. Stable renal parameters, with markedly elevated BUN. Normal K and Mg Reprogrammed his single chamber device to VVI 50 bpm. Now in NSR in 70s.   Inpatient Medications    Scheduled Meds: . amLODipine  5 mg Per Tube Daily  . chlorhexidine gluconate (MEDLINE KIT)  15 mL Mouth Rinse BID  . Chlorhexidine Gluconate Cloth  6 each Topical Daily  . clonazePAM  1 mg Per Tube TID  . darbepoetin (ARANESP) injection - NON-DIALYSIS  60 mcg Subcutaneous Q Mon-1800  . feeding supplement (PROSource TF)  90 mL Per Tube TID  . fentaNYL  1 patch Transdermal Q72H   And  . fentaNYL  1 patch Transdermal Q72H  . heparin injection (subcutaneous)  5,000 Units Subcutaneous Q8H  . insulin aspart  0-9 Units Subcutaneous Q4H  . levothyroxine  125 mcg Per Tube Q0600  . mouth rinse  15 mL Mouth Rinse 10 times per day  . metolazone  5 mg Per Tube Daily  . multivitamin with minerals  1 tablet Per Tube Daily  . nutrition supplement (JUVEN)  1 packet Per Tube BID BM  . pantoprazole sodium  40 mg Per Tube Daily  . sertraline  100 mg Per Tube Daily  . sodium chloride flush  10-40 mL Intracatheter Q12H  . sodium chloride flush  10-40 mL Intracatheter Q12H  . traZODone  100 mg Per Tube QHS   Continuous Infusions: . sodium chloride Stopped (04/15/2020 1808)  . feeding supplement (OSMOLITE 1.5 CAL) 60 mL/hr at 05/12/20 0400  . furosemide 120 mg (05/12/20 0903)  . meropenem (MERREM) IV 1 g (05/12/20 1023)   PRN Meds: albuterol, docusate, fentaNYL (SUBLIMAZE) injection, oxyCODONE, polyethylene glycol, prochlorperazine, sodium chloride flush, sodium chloride flush   Vital Signs    Vitals:   05/12/20 0821 05/12/20 0900 05/12/20 1000 05/12/20 1100  BP:  (!) 166/90 (!) 161/93 (!) 161/92   Pulse:  80 71 73  Resp: '18 18 18 18  ' Temp:      TempSrc:      SpO2: 94% 98% 94% 95%  Weight:      Height:        Intake/Output Summary (Last 24 hours) at 05/12/2020 1132 Last data filed at 05/12/2020 1023 Gross per 24 hour  Intake 1069.39 ml  Output 2305 ml  Net -1235.61 ml   Last 3 Weights 05/12/2020 05/11/2020 05/10/2020  Weight (lbs) 266 lb 12.1 oz 252 lb 6.8 oz 260 lb 2.3 oz  Weight (kg) 121 kg 114.5 kg 118 kg      Telemetry    NSR, occ PVCs - Personally Reviewed  ECG    No new tracing - Personally Reviewed  Physical Exam  Alert, not particularly communicative GEN: No acute distress.   Neck: No JVD Cardiac: RRR, no murmurs, rubs, or gallops.  Respiratory: Clear to auscultation bilaterally. GI: Soft, nontender, non-distended  MS: Anasarca; No deformity. Neuro:  Nonfocal  Psych: Normal affect   Labs    High Sensitivity Troponin:   Recent Labs  Lab 04/24/2020 1606 04/16/2020 2200  TROPONINIHS 15 31*      Chemistry Recent Labs  Lab 05/07/20 0730 05/08/20 0611 05/09/20 0151 05/10/20 0100 05/11/20 0425 05/11/20 2048 05/12/20 0432  NA 139  136 137   < > 137 137 136  136  K 3.9  3.8 3.9   < > 4.4 4.4 4.4 4.4  CL 103  101 100   < > 101 101  --  101  CO2 22  21* 22   < > 22 24  --  25  GLUCOSE 117*  114* 120*   < > 122* 125*  --  119*  BUN 136*  136* 130*   < > 141* 152*  --  158*  CREATININE 2.62*  2.61* 2.42*   < > 2.39* 2.30*  --  2.20*  CALCIUM 8.7*  8.6* 8.7*   < > 8.6* 9.0  --  8.7*  PROT 6.3* 6.1*  --   --  6.3*  --   --   ALBUMIN 2.4*  2.4* 2.5*   < > 2.1* 2.0*  --  1.9*  AST 22 36  --   --  36  --   --   ALT 12 16  --   --  20  --   --   ALKPHOS 158* 191*  --   --  217*  --   --   BILITOT 0.8 1.0  --   --  0.6  --   --   GFRNONAA 29*  29* 31*   < > 32* 33*  --  35*  ANIONGAP '14  14 15   ' < > 14 12  --  10   < > = values in this interval not displayed.     Hematology Recent Labs  Lab 05/10/20 0100 05/10/20 1241 05/11/20 0425  05/11/20 1230 05/11/20 2048 05/12/20 0432  WBC 6.4  --  7.7  --   --  7.2  RBC 2.39*  --  2.39*  --   --  2.53*  HGB 6.8*   < > 6.7* 10.0* 7.1* 7.2*  HCT 22.3*   < > 22.0* 32.7* 21.0* 23.3*  MCV 93.3  --  92.1  --   --  92.1  MCH 28.5  --  28.0  --   --  28.5  MCHC 30.5  --  30.5  --   --  30.9  RDW 16.6*  --  16.7*  --   --  16.8*  PLT 147*  --  139*  --   --  156   < > = values in this interval not displayed.    BNPNo results for input(s): BNP, PROBNP in the last 168 hours.   DDimer No results for input(s): DDIMER in the last 168 hours.   Radiology    DG CHEST PORT 1 VIEW  Result Date: 05/11/2020 CLINICAL DATA:  Shortness of breath, unable to elevate chin EXAM: PORTABLE CHEST 1 VIEW COMPARISON:  Radiograph 05/08/2020 FINDINGS: Tracheostomy tube tip terminates in the mid trachea. Right upper extremity PICC tip terminates near the superior cavoatrial junction. Telemetry leads overlie the chest. Lung volumes are diminished from comparison exam. Increasing hazy interstitial opacities with a patchy perihilar and basilar predominance and gradient density obscuring the hemidiaphragms likely reflecting worsening pulmonary edema with increasing layering bilateral effusions accentuated by low volumes. Prominence of the cardiomediastinal silhouette, poorly visualized due to overlying opacity. Chin overlies the lung apices. No acute osseous or soft tissue abnormality. IMPRESSION: Worsening pulmonary edema and bilateral effusions. Support devices as above. Electronically Signed   By: Lovena Le M.D.   On: 05/11/2020 20:36   VAS [place] UPPER EXTREMITY VENOUS DUPLEX  Result Date: 05/11/2020 UPPER VENOUS STUDY  Indications: Swelling Comparison Study:  no prior Performing Technologist: Abram Sander RVS  Examination Guidelines: A complete evaluation includes B-mode imaging, spectral Doppler, color Doppler, and power Doppler as needed of all accessible portions of each vessel. Bilateral testing is considered  an integral part of a complete examination. Limited examinations for reoccurring indications may be performed as noted.  Left Findings: +----------+------------+---------+-----------+----------+--------------+ LEFT      CompressiblePhasicitySpontaneousProperties   Summary     +----------+------------+---------+-----------+----------+--------------+ IJV                                                 Not visualized +----------+------------+---------+-----------+----------+--------------+ Subclavian    Full       Yes       Yes                             +----------+------------+---------+-----------+----------+--------------+ Axillary      Full       Yes       Yes                             +----------+------------+---------+-----------+----------+--------------+ Brachial      Full       Yes       Yes                             +----------+------------+---------+-----------+----------+--------------+ Radial        Full                                                 +----------+------------+---------+-----------+----------+--------------+ Ulnar         Full                                                 +----------+------------+---------+-----------+----------+--------------+ Cephalic      None                                                 +----------+------------+---------+-----------+----------+--------------+ Basilic       Full                                                 +----------+------------+---------+-----------+----------+--------------+  Summary:  Left: No evidence of deep vein thrombosis in the upper extremity. Findings consistent with age indeterminate superficial vein thrombosis involving the left cephalic vein.  *See table(s) above for measurements and observations.  Diagnosing physician: Deitra Mayo MD Electronically signed by Deitra Mayo MD on 05/11/2020 at 5:02:39 PM.    Final    [place] EKG SITE RITE  Result Date:  05/11/2020 If Site Rite image not attached, placement could not be confirmed due to current cardiac rhythm.   Cardiac Studies   ECHO: 04/30/2020 1. Left ventricular ejection fraction,  by estimation, is 45 to 50%. The  left ventricle has mildly decreased function. The left ventricle  demonstrates global hypokinesis. Left ventricular diastolic parameters are  consistent with Grade II diastolic  dysfunction (pseudonormalization).  2. Right ventricular systolic function is moderately reduced.  3. The pericardial effusion is anterior to the right ventricle and  circumferential.   ECHO: 06/08/2019 INTERPRETATION ---------------------------------------------------------------  NORMAL LEFT VENTRICULAR SYSTOLIC FUNCTION WITH MILD LVH  MILD RV SYSTOLIC DYSFUNCTION (See above)  VALVULAR REGURGITATION: TRIVIAL MR, TRIVIAL TR  NO VALVULAR STENOSIS  INSUFFICIENT TR TO ESTIMATED RVSP    Patient Profile     55 y.o. 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  Assessment & Plan    1. Anasarca: aggressive fluid resuscitation on background of severe malnutritio/hypoalbuminemia and fludrocortisone therapy, further complicated by acute on chronic renal insufficiency and mildly reduced LVEF. Now hypertensive, 30 lb above admission weight. Fludrocortisone effect will gradually dissipate over as much as 2 weeks.  Continue diuresis. 2. HFrEF: may be mostly due to RV apical pacing related dyssynchrony and loss of normal AV association. Will repeat a limited echo now that he has normal AV conducted sinus rhythm. Echo in 08/21 with normal LVEF. 3. Ac on chr renal insuff: diuretic Rx per Nephrology. 4. 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 Will only need backup pacing infrequently. Lower rate limit reduced to 50. 5. Sepsis: resolved. 6. Dysautonomia: may need to resume fludrocortisone and/or midodrine once diuresed to "dry weight".  CRITICAL CARE Performed by: Dani Gobble Shylie Polo   Total critical care time: 50 minutes  Critical care time was exclusive of separately billable procedures and treating other patients.  Critical care was necessary to treat or prevent imminent or life-threatening deterioration.  Critical care was time spent personally by me on the following activities: development of treatment plan with patient and/or surrogate as well as nursing, discussions with consultants, evaluation of patient's response to treatment, examination of patient, obtaining history from patient or surrogate, ordering and performing treatments and interventions, ordering and review of laboratory studies, ordering and review of radiographic studies, pulse oximetry and re-evaluation of patient's condition.     For questions or updates, please contact [place] Please consult [email or web address] for contact info under        Signed, Sanda Klein, MD  05/12/2020, 11:32 AM

## 2020-05-12 NOTE — Progress Notes (Signed)
Only able to observe the dressings visible from the front of the patient. Patient refuses to turn to see posterior wounds and will not let nursing staff pull back dressings already in place to observe the condition of the wounds.

## 2020-05-12 NOTE — Progress Notes (Addendum)
PROGRESS NOTE    Phillip Klein  IHW:[id number] DOB: [birth year] DOA: 04/21/2020 PCP: [name], MD   Brief Narrative:  This 55 years old male with PMH significant for chronic hypoxic respiratory failure with tracheostomy and vent since 2021 after C-spine surgery, GERD, DM, anemia of chronic disease, quadriplegia, malnutrition, neurogenic orthostatic hypotension, untreated HCV, previous IV drug use, cirrhosis, chronic pain syndrome was sent from [facility] with septic shock.  Patient admitted in the ICU with septic shock secondary to ESBL/ [name] bacteremia.  Patient had Hickman's catheter which was removed,  presumed to be the source of infection.  Patient is getting meropenem.  Patient is also having acute kidney injury which is recovering,  nephrology is following.  Assessment & Plan:   Principal Problem:   Gram-negative bacteremia Active Problems:   Septic shock (HCC)   Pressure injury of skin   Malnutrition of moderate degree   Encounter for feeding tube placement   Hypotension   Quadriplegia (HCC)   Infection due to ESBL-producing [name] pneumoniae   Chronic hepatitis C without hepatic coma (HCC)   Anemia   Jejunostomy tube leak (HCC)   Pleural effusion   Sinus arrest   Pacemaker   HFrEF (heart failure with reduced ejection fraction) (HCC)   Septic shock sec to [name] bacteremia from UTI vs line infection: Patient presented with septic shock at admission. Hickman catheter removed, presumed to be the source of infection. Blood cultures grew [name] bacteremia. UA + Continue meropenem for 10 days from when lines were DC'd until 3/9, repeat blood cultures negative. Patient remains afebrile, without leukocytosis. Plan is to receive PICC line once antibiotics completed. PICC line inserted yesterday / Left arm iv line removed. Duplex : No DVT. Sepsis physiology is improving.  AKI most likely ATN in setting of septic shock / Anasarca: Nephrology  is following.  There is no urgent indication for dialysis at this time. Patient has received trials of albumin plus Lasix which was not effective. Nephrology recommended Lasix 120 mg every 12 hours.  Added metolazone 5 mg daily Monitor BMP and urine output. Patient has good urine output 2.3 L/ 24 hr.  CHF Exacerbation (Combined systolic and diastolic): Patient remains with generalized body swelling, Echocardiogram LVEF 40 to 45%.  Global hypokinesis. Continue Lasix 120 mg every 12 hours as per nephro. Cardiology recommended continue aggressive diuresis.  Jejunostomy tube drainage> improved. Continue feeding through J tube. Appears to be at goal.  Hypotension likely related to orthostatic hypotension. Florinef was discontinued in the ICU, probably contributing to water retention. Midodrine has been stopped. BP been improving , now elevated.  Chronic vent dependent hypoxic respiratory failure: Due to quadriplegia. Continue current ventilatory support with trach.  PCCM helping with managing vent setting.   Multiple pressure ulcers/wounds present on admission: Continue Wound care  Chronic pain syndrome: Continue fentanyl patch. Oxycodone to prn  Depression, insomnia Continue Sertraline, trazodone. Continue clonazepam  GERD  Continue Pantroprazole  Anemia of chronic disease HGB drop to 6.8 from 7.1 Transfuse for HGB < 7, 2 U given Post transfusion Hb , 7.2 No evidence of bleeding. Stool occult blood negative.  Left Arm swelling: Removed peripheral line from the left arm. Venous duplex negative for DVT.   DVT prophylaxis: Heparin sq Code Status: Full code. Family Communication:  No family at bed side. Disposition Plan:    Status is: Inpatient  Remains inpatient appropriate because:Inpatient level of care appropriate due to severity of illness   Dispo: The patient is from: SNF  Anticipated d/c is to: LTAC              Patient currently is not  medically stable to d/c.   Difficult to place patient No.  Awaiting transfer back to [facility] or select pending bed availability.  Consultants:   PCCM.  Procedures: Hickman removed.  Antimicrobials:  Anti-infectives (From admission, onward)   Start     Dose/Rate Route Frequency Ordered Stop   05/11/20 0800  meropenem (MERREM) 1 g in sodium chloride 0.9 % 100 mL IVPB        1 g 200 mL/hr over 30 Minutes Intravenous Every 8 hours 05/11/20 0707 05/12/20 2359   05/11/20 0715  meropenem (MERREM) 1 g in sodium chloride 0.9 % 100 mL IVPB  Status:  Discontinued        1 g 200 mL/hr over 30 Minutes Intravenous Every 8 hours 05/11/20 0706 05/11/20 0707   05/03/20 2200  meropenem (MERREM) 1 g in sodium chloride 0.9 % 100 mL IVPB  Status:  Discontinued        1 g 200 mL/hr over 30 Minutes Intravenous Every 12 hours 05/03/20 1041 05/11/20 0706   04/30/20 1145  meropenem (MERREM) 2 g in sodium chloride 0.9 % 100 mL IVPB  Status:  Discontinued        2 g 200 mL/hr over 30 Minutes Intravenous Every 12 hours 04/30/20 1052 05/03/20 1041   04/30/20 0530  ceFEPIme (MAXIPIME) 2 g in sodium chloride 0.9 % 100 mL IVPB  Status:  Discontinued        2 g 200 mL/hr over 30 Minutes Intravenous Every 12 hours 04/06/2020 1852 04/30/20 1051   04/22/2020 1852  vancomycin variable dose per unstable renal function (pharmacist dosing)  Status:  Discontinued         Does not apply See admin instructions 04/19/2020 1852 04/30/20 1051   04/11/2020 1630  vancomycin (VANCOREADY) IVPB 2000 mg/400 mL        2,000 mg 200 mL/hr over 120 Minutes Intravenous  Once 04/25/2020 1621 04/21/2020 2008   04/09/2020 1630  ceFEPIme (MAXIPIME) 2 g in sodium chloride 0.9 % 100 mL IVPB        2 g 200 mL/hr over 30 Minutes Intravenous  Once 04/23/2020 1621 04/28/2020 1749      Subjective: Patient was seen and examined at bedside.  Overnight events noted.  Patient seems alert and oriented,  He talks in a husky voice.  Patient has a tracheostomy, on the  vent with full mechanical support.   He is still pretty swollen, been on aggressive diuresis. Objective: Vitals:   05/12/20 1000 05/12/20 1100 05/12/20 1141 05/12/20 1200  BP: (!) 161/93 (!) 161/92  (!) 165/95  Pulse: 71 73  75  Resp: '18 18  19  ' Temp:   (!) 97.3 F (36.3 C)   TempSrc:   Oral   SpO2: 94% 95%  92%  Weight:      Height:        Intake/Output Summary (Last 24 hours) at 05/12/2020 1224 Last data filed at 05/12/2020 1211 Gross per 24 hour  Intake 979.39 ml  Output 2100 ml  Net -1120.61 ml   Filed Weights   05/10/20 0500 05/11/20 0500 05/12/20 0437  Weight: 118 kg 114.5 kg 121 kg    Examination:  General exam: Appears calm and comfortable, chronically ill-appearing.  Not in any distress.  Patient has tracheostomy tube. Respiratory system: Clear to auscultation. Respiratory effort normal. Cardiovascular system: S1 & S2  heard, RRR. No JVD, murmurs, rubs, gallops or clicks. No pedal edema. Gastrointestinal system: Abdomen is nondistended, soft and nontender. No organomegaly or masses felt.  Normal bowel sounds heard. Central nervous system: Alert and oriented x 2.  Extremities: 2+ pitting edema.  No cyanosis no clubbing. Skin: No rashes, lesions or ulcers Psychiatry: Not assessed.     Data Reviewed: I have personally reviewed following labs and imaging studies  CBC: Recent Labs  Lab 05/07/20 0730 05/08/20 0611 05/10/20 0100 05/10/20 1241 05/10/20 2131 05/11/20 0425 05/11/20 1230 05/11/20 2048 05/12/20 0432  WBC 7.2 6.6 6.4  --   --  7.7  --   --  7.2  NEUTROABS 4.1  --   --   --   --   --   --   --   --   HGB 7.0* 7.0* 6.8*   < > 6.8* 6.7* 10.0* 7.1* 7.2*  HCT 22.8* 22.5* 22.3*   < > 22.3* 22.0* 32.7* 21.0* 23.3*  MCV 89.4 89.6 93.3  --   --  92.1  --   --  92.1  PLT 148* 160 147*  --   --  139*  --   --  156   < > = values in this interval not displayed.   Basic Metabolic Panel: Recent Labs  Lab 05/08/20 0611 05/09/20 0151 05/10/20 0100  05/11/20 0425 05/11/20 2048 05/12/20 0432  NA 137 137 137 137 136 136  K 3.9 4.1 4.4 4.4 4.4 4.4  CL 100 101 101 101  --  101  CO2 '22 24 22 24  ' --  25  GLUCOSE 120* 124* 122* 125*  --  119*  BUN 130* 128* 141* 152*  --  158*  CREATININE 2.42* 2.39* 2.39* 2.30*  --  2.20*  CALCIUM 8.7* 8.6* 8.6* 9.0  --  8.7*  MG 1.8  --  1.8 1.7  --  1.8  PHOS 6.0* 6.4* 7.4*  7.3* 7.5*  --  7.7*   GFR: Estimated Creatinine Clearance: 54.3 mL/min (A) (by C-G formula based on SCr of 2.2 mg/dL (H)). Liver Function Tests: Recent Labs  Lab 05/07/20 0730 05/08/20 0611 05/09/20 0151 05/10/20 0100 05/11/20 0425 05/12/20 0432  AST 22 36  --   --  36  --   ALT 12 16  --   --  20  --   ALKPHOS 158* 191*  --   --  217*  --   BILITOT 0.8 1.0  --   --  0.6  --   PROT 6.3* 6.1*  --   --  6.3*  --   ALBUMIN 2.4*  2.4* 2.5* 2.1* 2.1* 2.0* 1.9*   No results for input(s): LIPASE, AMYLASE in the last 168 hours. No results for input(s): AMMONIA in the last 168 hours. Coagulation Profile: Recent Labs  Lab 05/11/20 0637  INR 1.2   Cardiac Enzymes: No results for input(s): CKTOTAL, CKMB, CKMBINDEX, TROPONINI in the last 168 hours. BNP (last 3 results) No results for input(s): PROBNP in the last 8760 hours. HbA1C: No results for input(s): HGBA1C in the last 72 hours. CBG: Recent Labs  Lab 05/11/20 1942 05/11/20 2328 05/12/20 0339 05/12/20 0805 05/12/20 1139  GLUCAP 131* 120* 116* 120* 126*   Lipid Profile: No results for input(s): CHOL, HDL, LDLCALC, TRIG, CHOLHDL, LDLDIRECT in the last 72 hours. Thyroid Function Tests: No results for input(s): TSH, T4TOTAL, FREET4, T3FREE, THYROIDAB in the last 72 hours. Anemia Panel: Recent Labs  05/11/20 0425  TIBC 118*  IRON 33*   Sepsis Labs: No results for input(s): PROCALCITON, LATICACIDVEN in the last 168 hours.  Recent Results (from the past 240 hour(s))  Culture, blood (Routine X 2) w Reflex to ID Panel     Status: None (Preliminary  result)   Collection Time: 05/08/20 12:44 PM   Specimen: BLOOD RIGHT HAND  Result Value Ref Range Status   Specimen Description BLOOD RIGHT HAND  Final   Special Requests   Final    BOTTLES DRAWN AEROBIC ONLY Blood Culture results may not be optimal due to an excessive volume of blood received in culture bottles   Culture   Final    NO GROWTH 4 DAYS Performed at [facility], [place] [address], [place], [place] [address]    Report Status PENDING  Incomplete  Culture, blood (Routine X 2) w Reflex to ID Panel     Status: None (Preliminary result)   Collection Time: 05/08/20 12:45 PM   Specimen: BLOOD LEFT HAND  Result Value Ref Range Status   Specimen Description BLOOD LEFT HAND  Final   Special Requests   Final    BOTTLES DRAWN AEROBIC ONLY Blood Culture results may not be optimal due to an excessive volume of blood received in culture bottles   Culture   Final    NO GROWTH 4 DAYS Performed at [facility], [place] [address], [place], [place] [address]    Report Status PENDING  Incomplete         Radiology Studies: DG CHEST PORT 1 VIEW  Result Date: 05/11/2020 CLINICAL DATA:  Shortness of breath, unable to elevate chin EXAM: PORTABLE CHEST 1 VIEW COMPARISON:  Radiograph 05/08/2020 FINDINGS: Tracheostomy tube tip terminates in the mid trachea. Right upper extremity PICC tip terminates near the superior cavoatrial junction. Telemetry leads overlie the chest. Lung volumes are diminished from comparison exam. Increasing hazy interstitial opacities with a patchy perihilar and basilar predominance and gradient density obscuring the hemidiaphragms likely reflecting worsening pulmonary edema with increasing layering bilateral effusions accentuated by low volumes. Prominence of the cardiomediastinal silhouette, poorly visualized due to overlying opacity. Chin overlies the lung apices. No acute osseous or soft tissue abnormality. IMPRESSION: Worsening pulmonary edema and bilateral  effusions. Support devices as above. Electronically Signed   By: Lovena Le M.D.   On: 05/11/2020 20:36   VAS [place] UPPER EXTREMITY VENOUS DUPLEX  Result Date: 05/11/2020 UPPER VENOUS STUDY  Indications: Swelling Comparison Study: no prior Performing Technologist: Abram Sander RVS  Examination Guidelines: A complete evaluation includes B-mode imaging, spectral Doppler, color Doppler, and power Doppler as needed of all accessible portions of each vessel. Bilateral testing is considered an integral part of a complete examination. Limited examinations for reoccurring indications may be performed as noted.  Left Findings: +----------+------------+---------+-----------+----------+--------------+ LEFT      CompressiblePhasicitySpontaneousProperties   Summary     +----------+------------+---------+-----------+----------+--------------+ IJV                                                 Not visualized +----------+------------+---------+-----------+----------+--------------+ Subclavian    Full       Yes       Yes                             +----------+------------+---------+-----------+----------+--------------+  Axillary      Full       Yes       Yes                             +----------+------------+---------+-----------+----------+--------------+ Brachial      Full       Yes       Yes                             +----------+------------+---------+-----------+----------+--------------+ Radial        Full                                                 +----------+------------+---------+-----------+----------+--------------+ Ulnar         Full                                                 +----------+------------+---------+-----------+----------+--------------+ Cephalic      None                                                 +----------+------------+---------+-----------+----------+--------------+ Basilic       Full                                                  +----------+------------+---------+-----------+----------+--------------+  Summary:  Left: No evidence of deep vein thrombosis in the upper extremity. Findings consistent with age indeterminate superficial vein thrombosis involving the left cephalic vein.  *See table(s) above for measurements and observations.  Diagnosing physician: Deitra Mayo MD Electronically signed by Deitra Mayo MD on 05/11/2020 at 5:02:39 PM.    Final    [place] EKG SITE RITE  Result Date: 05/11/2020 If Site Rite image not attached, placement could not be confirmed due to current cardiac rhythm.       Scheduled Meds: . amLODipine  5 mg Per Tube Daily  . chlorhexidine gluconate (MEDLINE KIT)  15 mL Mouth Rinse BID  . Chlorhexidine Gluconate Cloth  6 each Topical Daily  . clonazePAM  1 mg Per Tube TID  . darbepoetin (ARANESP) injection - NON-DIALYSIS  60 mcg Subcutaneous Q Mon-1800  . feeding supplement (PROSource TF)  90 mL Per Tube TID  . fentaNYL  1 patch Transdermal Q72H   And  . fentaNYL  1 patch Transdermal Q72H  . heparin injection (subcutaneous)  5,000 Units Subcutaneous Q8H  . insulin aspart  0-9 Units Subcutaneous Q4H  . levothyroxine  125 mcg Per Tube Q0600  . mouth rinse  15 mL Mouth Rinse 10 times per day  . metolazone  5 mg Per Tube Daily  . multivitamin with minerals  1 tablet Per Tube Daily  . nutrition supplement (JUVEN)  1 packet Per Tube BID BM  . pantoprazole sodium  40 mg Per Tube Daily  . sertraline  100 mg Per Tube Daily  . sodium  chloride flush  10-40 mL Intracatheter Q12H  . sodium chloride flush  10-40 mL Intracatheter Q12H  . traZODone  100 mg Per Tube QHS   Continuous Infusions: . sodium chloride Stopped (04/27/2020 1808)  . feeding supplement (OSMOLITE 1.5 CAL) 60 mL/hr at 05/12/20 0400  . furosemide 120 mg (05/12/20 0903)  . meropenem (MERREM) IV 1 g (05/12/20 1023)     LOS: 13 days    Time spent: 35 mins.   Shawna Clamp, MD [facility]   If  7PM-7AM, please contact night-coverage

## 2020-05-12 NOTE — Progress Notes (Addendum)
Patient with multiple calls today ranging from 5-10 minutes apart. Patient with t small request, statements or questions. Patient with bruise present on assessment left shoulder, Xray  Done. Patient asked by multiple nurses several times today for a bath and dressing changes and  Patient refuses. While patient was resting after pain medication he allowed me to change out this ostomy and abdominal dressing and wounds to right and left lower ext. Patient's wife by to visit and MD gave her updates over the phone. MD also made aware patient QTC was 513. This took place around 1700. Will continue to monitor.

## 2020-05-12 NOTE — Progress Notes (Signed)
Patient ID: Phillip Klein, male   DOB: 1965/07/03, 55 y.o.   MRN: [id number] S: Improved urine output over the past 24 hours with receiving Diuril yesterday.  Patient refusing wound care.  Denies complaint  O:BP (!) 161/93   Pulse 71   Temp (!) 96.5 F (35.8 C) (Axillary)   Resp 18   Ht _0  (1.88 m)   Wt 121 kg   SpO2 94%   BMI 34.25 kg/m   Intake/Output Summary (Last 24 hours) at 05/12/2020 1113 Last data filed at 05/12/2020 1023 Gross per 24 hour  Intake 1069.39 ml  Output 2305 ml  Net -1235.61 ml   Intake/Output: I/O last 3 completed shifts: In: 3658.7 [Blood:1315; Other:300; NG/GT:1380; IV Piggyback:663.7] Out: 2895 [Urine:2145; Stool:750]  Intake/Output this shift:  Total I/O In: 10 [I.V.:10] Out: 350 [Urine:350] Weight change: 6.5 kg Gen: On vent via trach in NAD CVS: Normal rate Resp: decreased BS at bases Abd: +BS, soft, NT, +PEG Ext: 2+ anasarca  Recent Labs  Lab 05/06/20 0353 05/07/20 0730 05/08/20 0611 05/09/20 0151 05/10/20 0100 05/11/20 0425 05/11/20 2048 05/12/20 0432  NA 137 139  136 137 137 137 137 136 136  K 3.9 3.9  3.8 3.9 4.1 4.4 4.4 4.4 4.4  CL 105 103  101 100 101 101 101  --  101  CO2 17* 22  21* _1 --  25  GLUCOSE 106* 117*  114* 120* 124* 122* 125*  --  119*  BUN 117* 136*  136* 130* 128* 141* 152*  --  158*  CREATININE 2.58* 2.62*  2.61* 2.42* 2.39* 2.39* 2.30*  --  2.20*  ALBUMIN 2.3* 2.4*  2.4* 2.5* 2.1* 2.1* 2.0*  --  1.9*  CALCIUM 8.5* 8.7*  8.6* 8.7* 8.6* 8.6* 9.0  --  8.7*  PHOS 6.1* 6.0* 6.0* 6.4* 7.4*  7.3* 7.5*  --  7.7*  AST  --  22 36  --   --  36  --   --   ALT  --  12 16  --   --  20  --   --    Liver Function Tests: Recent Labs  Lab 05/07/20 0730 05/08/20 0611 05/09/20 0151 05/10/20 0100 05/11/20 0425 05/12/20 0432  AST 22 36  --   --  36  --   ALT 12 16  --   --  20  --   ALKPHOS 158* 191*  --   --  217*  --   BILITOT 0.8 1.0  --   --  0.6  --   PROT 6.3* 6.1*  --   --  6.3*  --    ALBUMIN 2.4*  2.4* 2.5*   < > 2.1* 2.0* 1.9*   < > = values in this interval not displayed.   No results for input(s): LIPASE, AMYLASE in the last 168 hours. No results for input(s): AMMONIA in the last 168 hours. CBC: Recent Labs  Lab 05/07/20 0730 05/08/20 0611 05/10/20 0100 05/10/20 1241 05/11/20 0425 05/11/20 1230 05/11/20 2048 05/12/20 0432  WBC 7.2 6.6 6.4  --  7.7  --   --  7.2  NEUTROABS 4.1  --   --   --   --   --   --   --   HGB 7.0* 7.0* 6.8*   < > 6.7* 10.0* 7.1* 7.2*  HCT 22.8* 22.5* 22.3*   < > 22.0* 32.7* 21.0* 23.3*  MCV 89.4 89.6 93.3  --  92.1  --   --  92.1  PLT 148* 160 147*  --  139*  --   --  156   < > = values in this interval not displayed.   Cardiac Enzymes: No results for input(s): CKTOTAL, CKMB, CKMBINDEX, TROPONINI in the last 168 hours. CBG: Recent Labs  Lab 05/11/20 1545 05/11/20 1942 05/11/20 2328 05/12/20 0339 05/12/20 0805  GLUCAP 105* 131* 120* 116* 120*    Iron Studies:  Recent Labs    05/11/20 0425  IRON 33*  TIBC 118*   Studies/Results: DG CHEST PORT 1 VIEW  Result Date: 05/11/2020 CLINICAL DATA:  Shortness of breath, unable to elevate chin EXAM: PORTABLE CHEST 1 VIEW COMPARISON:  Radiograph 05/08/2020 FINDINGS: Tracheostomy tube tip terminates in the mid trachea. Right upper extremity PICC tip terminates near the superior cavoatrial junction. Telemetry leads overlie the chest. Lung volumes are diminished from comparison exam. Increasing hazy interstitial opacities with a patchy perihilar and basilar predominance and gradient density obscuring the hemidiaphragms likely reflecting worsening pulmonary edema with increasing layering bilateral effusions accentuated by low volumes. Prominence of the cardiomediastinal silhouette, poorly visualized due to overlying opacity. Chin overlies the lung apices. No acute osseous or soft tissue abnormality. IMPRESSION: Worsening pulmonary edema and bilateral effusions. Support devices as above.  Electronically Signed   By: Lovena Le M.D.   On: 05/11/2020 20:36   VAS [place] UPPER EXTREMITY VENOUS DUPLEX  Result Date: 05/11/2020 UPPER VENOUS STUDY  Indications: Swelling Comparison Study: no prior Performing Technologist: Abram Sander RVS  Examination Guidelines: A complete evaluation includes B-mode imaging, spectral Doppler, color Doppler, and power Doppler as needed of all accessible portions of each vessel. Bilateral testing is considered an integral part of a complete examination. Limited examinations for reoccurring indications may be performed as noted.  Left Findings: +----------+------------+---------+-----------+----------+--------------+ LEFT      CompressiblePhasicitySpontaneousProperties   Summary     +----------+------------+---------+-----------+----------+--------------+ IJV                                                 Not visualized +----------+------------+---------+-----------+----------+--------------+ Subclavian    Full       Yes       Yes                             +----------+------------+---------+-----------+----------+--------------+ Axillary      Full       Yes       Yes                             +----------+------------+---------+-----------+----------+--------------+ Brachial      Full       Yes       Yes                             +----------+------------+---------+-----------+----------+--------------+ Radial        Full                                                 +----------+------------+---------+-----------+----------+--------------+ Ulnar         Full                                                 +----------+------------+---------+-----------+----------+--------------+  Cephalic      None                                                 +----------+------------+---------+-----------+----------+--------------+ Basilic       Full                                                  +----------+------------+---------+-----------+----------+--------------+  Summary:  Left: No evidence of deep vein thrombosis in the upper extremity. Findings consistent with age indeterminate superficial vein thrombosis involving the left cephalic vein.  *See table(s) above for measurements and observations.  Diagnosing physician: Deitra Mayo MD Electronically signed by Deitra Mayo MD on 05/11/2020 at 5:02:39 PM.    Final    [place] EKG SITE RITE  Result Date: 05/11/2020 If Site Rite image not attached, placement could not be confirmed due to current cardiac rhythm.  Marland Kitchen amLODipine  5 mg Per Tube Daily  . chlorhexidine gluconate (MEDLINE KIT)  15 mL Mouth Rinse BID  . Chlorhexidine Gluconate Cloth  6 each Topical Daily  . darbepoetin (ARANESP) injection - NON-DIALYSIS  60 mcg Subcutaneous Q Mon-1800  . feeding supplement (PROSource TF)  90 mL Per Tube TID  . fentaNYL  1 patch Transdermal Q72H   And  . fentaNYL  1 patch Transdermal Q72H  . heparin injection (subcutaneous)  5,000 Units Subcutaneous Q8H  . insulin aspart  0-9 Units Subcutaneous Q4H  . levothyroxine  125 mcg Per Tube Q0600  . mouth rinse  15 mL Mouth Rinse 10 times per day  . metolazone  5 mg Per Tube Daily  . multivitamin with minerals  1 tablet Per Tube Daily  . nutrition supplement (JUVEN)  1 packet Per Tube BID BM  . pantoprazole sodium  40 mg Per Tube Daily  . sertraline  100 mg Per Tube Daily  . sodium chloride flush  10-40 mL Intracatheter Q12H  . sodium chloride flush  10-40 mL Intracatheter Q12H  . traZODone  100 mg Per Tube QHS    BMET    Component Value Date/Time   NA 136 05/12/2020 0432   K 4.4 05/12/2020 0432   CL 101 05/12/2020 0432   CO2 25 05/12/2020 0432   GLUCOSE 119 (H) 05/12/2020 0432   BUN 158 (H) 05/12/2020 0432   CREATININE 2.20 (H) 05/12/2020 0432   CALCIUM 8.7 (L) 05/12/2020 0432   GFRNONAA 35 (L) 05/12/2020 0432   CBC    Component Value Date/Time   WBC 7.2 05/12/2020 0432    RBC 2.53 (L) 05/12/2020 0432   HGB 7.2 (L) 05/12/2020 0432   HCT 23.3 (L) 05/12/2020 0432   PLT 156 05/12/2020 0432   MCV 92.1 05/12/2020 0432   MCH 28.5 05/12/2020 0432   MCHC 30.9 05/12/2020 0432   RDW 16.8 (H) 05/12/2020 0432   LYMPHSABS 1.6 05/07/2020 0730   MONOABS 0.4 05/07/2020 0730   EOSABS 1.0 (H) 05/07/2020 0730   BASOSABS 0.0 05/07/2020 0730    Assessment/Plan: 1. AKI - likely ischemic ATN in setting of septic shock requiring pressors. Has chronic hypotension but blood pressure higher recently.  Creatinine is downtrending 1. Continue Lasix 120 mg twice daily for diuresis 2. Add metolazone 5 mg daily, responded  to Diuril yesterday 3. Cardiology following, appreciate assistance 4. IV albumin as needed 5. Will need Nephrology follow up at [facility] or [facility] once bed available for transfer. 2. Sepsis due to klebsiella bacteremia -significantly improved.  Management per primary 3. Chronic hypotension: Multifactorial with dysautonomia contributing.  Pressure significantly improved despite holding Florinef and midodrine. Diuresis as above. CMTM 4. Severe protein malnutrition - tube feeds, nutrition assisting 5. Acute on chronic diastolic CHF (LVEF 51-46% with grade II DD). Volume overload more likely related to nutrition, decreased mobility, Florinef, and acute illness 6. 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 On vent via trach 7. Multiple pressure ulcers/wounds - wound care.  Patient is refusing at this time 8. Chronic pain - on fentanyl patch and oxycodone. 9. Anemia: Hemoglobin now greater than 7. Will give Aranesp given repeated need for transfusions.  May check iron stores in 1 to 2 weeks after transfusion 10. Disposition - awaiting transfer back to [facility] or [facility] pending bed availability.

## 2020-05-12 NOTE — TOC Progression Note (Signed)
Transition of Care [facility]) - Progression Note    Patient Details  Name: Phillip Klein MRN: [id number] Date of Birth: 16-Nov-1965  [facility] CM/SW Contact  Epifanio Lesches, RN Phone Number: 05/12/2020, 2:47 PM  Clinical Narrative:    NCM spoke with [facility] admission liaison Prem [phone]). Prem requested updated clinicals for review. NCM faxed clinicals to [phone]...determination for SNF bed offer pending.  TOC team will continue to monitor and assist with needs ....    Expected Discharge Plan: Skilled Nursing Facility Barriers to Discharge: No SNF bed  Expected Discharge Plan and Services Expected Discharge Plan: Skilled Nursing Facility                                               Social Determinants of Health (SDOH) Interventions    Readmission Risk Interventions No flowsheet data found.

## 2020-05-12 NOTE — Progress Notes (Signed)
Oral Temp 94.4 F. Pt has refused any intervention from multiple staff.

## 2020-05-13 DIAGNOSIS — E43 Unspecified severe protein-calorie malnutrition: Secondary | ICD-10-CM

## 2020-05-13 DIAGNOSIS — J9622 Acute and chronic respiratory failure with hypercapnia: Secondary | ICD-10-CM

## 2020-05-13 DIAGNOSIS — Z950 Presence of cardiac pacemaker: Secondary | ICD-10-CM | POA: Diagnosis not present

## 2020-05-13 DIAGNOSIS — Z515 Encounter for palliative care: Secondary | ICD-10-CM | POA: Diagnosis not present

## 2020-05-13 DIAGNOSIS — A4159 Other Gram-negative sepsis: Secondary | ICD-10-CM | POA: Diagnosis not present

## 2020-05-13 DIAGNOSIS — J9621 Acute and chronic respiratory failure with hypoxia: Secondary | ICD-10-CM

## 2020-05-13 DIAGNOSIS — R7881 Bacteremia: Secondary | ICD-10-CM | POA: Diagnosis not present

## 2020-05-13 DIAGNOSIS — I959 Hypotension, unspecified: Secondary | ICD-10-CM | POA: Diagnosis not present

## 2020-05-13 DIAGNOSIS — R601 Generalized edema: Secondary | ICD-10-CM

## 2020-05-13 DIAGNOSIS — Z9911 Dependence on respirator [ventilator] status: Secondary | ICD-10-CM | POA: Diagnosis not present

## 2020-05-13 DIAGNOSIS — G8250 Quadriplegia, unspecified: Secondary | ICD-10-CM | POA: Diagnosis not present

## 2020-05-13 DIAGNOSIS — I5020 Unspecified systolic (congestive) heart failure: Secondary | ICD-10-CM | POA: Diagnosis not present

## 2020-05-13 DIAGNOSIS — A498 Other bacterial infections of unspecified site: Secondary | ICD-10-CM | POA: Diagnosis not present

## 2020-05-13 DIAGNOSIS — N1832 Chronic kidney disease, stage 3b: Secondary | ICD-10-CM

## 2020-05-13 DIAGNOSIS — A419 Sepsis, unspecified organism: Secondary | ICD-10-CM | POA: Diagnosis not present

## 2020-05-13 DIAGNOSIS — Z7189 Other specified counseling: Secondary | ICD-10-CM | POA: Diagnosis not present

## 2020-05-13 DIAGNOSIS — N179 Acute kidney failure, unspecified: Secondary | ICD-10-CM

## 2020-05-13 LAB — GLUCOSE, CAPILLARY
Glucose-Capillary: 105 mg/dL — ABNORMAL HIGH (ref 70–99)
Glucose-Capillary: 124 mg/dL — ABNORMAL HIGH (ref 70–99)
Glucose-Capillary: 113 mg/dL — ABNORMAL HIGH (ref 70–99)
Glucose-Capillary: 119 mg/dL — ABNORMAL HIGH (ref 70–99)
Glucose-Capillary: 99 mg/dL (ref 70–99)
Glucose-Capillary: 100 mg/dL — ABNORMAL HIGH (ref 70–99)

## 2020-05-13 LAB — RENAL FUNCTION PANEL
Albumin: 1.8 g/dL — ABNORMAL LOW (ref 3.5–5.0)
Anion gap: 12 (ref 5–15)
BUN: 164 mg/dL — ABNORMAL HIGH (ref 6–20)
CO2: 27 mmol/L (ref 22–32)
Calcium: 8.8 mg/dL — ABNORMAL LOW (ref 8.9–10.3)
Chloride: 101 mmol/L (ref 98–111)
Creatinine, Ser: 2.17 mg/dL — ABNORMAL HIGH (ref 0.61–1.24)
GFR, Estimated: 36 mL/min — ABNORMAL LOW (ref >60)
Glucose, Bld: 128 mg/dL — ABNORMAL HIGH (ref 70–99)
Phosphorus: 8 mg/dL — ABNORMAL HIGH (ref 2.5–4.6)
Potassium: 4.4 mmol/L (ref 3.5–5.1)
Sodium: 140 mmol/L (ref 135–145)

## 2020-05-13 LAB — CULTURE, BLOOD (ROUTINE X 2)
Culture: NO GROWTH
Culture: NO GROWTH

## 2020-05-13 LAB — HEMOGLOBIN AND HEMATOCRIT, BLOOD
HCT: 22.6 % — ABNORMAL LOW (ref 39.0–52.0)
Hemoglobin: 7.2 g/dL — ABNORMAL LOW (ref 13.0–17.0)

## 2020-05-13 MED ORDER — FUROSEMIDE 10 MG/ML IJ SOLN
120.0000 mg | Freq: Three times a day (TID) | INTRAVENOUS | Status: DC
  Administered 2020-05-13 (×3): 120 mg via INTRAVENOUS
  Filled 2020-05-13: qty 12
  Filled 2020-05-13 (×3): qty 10
  Filled 2020-05-13 (×2): qty 12

## 2020-05-13 MED ORDER — ALBUMIN HUMAN 25 % IV SOLN
25.0000 g | Freq: Once | INTRAVENOUS | Status: AC
  Administered 2020-05-13: 25 g via INTRAVENOUS
  Filled 2020-05-13: qty 100

## 2020-05-13 NOTE — Progress Notes (Signed)
PROGRESS NOTE    Phillip Klein  TOI:[id number] DOB: 01-Jan-1966 DOA: 04/14/2020 PCP: [name], MD   Brief Narrative:  This 55 years old male with PMH significant for chronic hypoxic respiratory failure with tracheostomy and vent since 2021 after C-spine surgery, GERD, DM, anemia of chronic disease, quadriplegia, malnutrition, neurogenic orthostatic hypotension, untreated HCV, previous IV drug use, cirrhosis, chronic pain syndrome was sent from [facility] with septic shock.  Patient admitted in the ICU with septic shock secondary to ESBL/ [name] bacteremia.  Patient had Hickman's catheter which was removed,  presumed to be the source of infection.  Patient is getting meropenem.  Patient is also having acute kidney injury which is recovering,  nephrology is following.  Assessment & Plan:   Principal Problem:   Gram-negative bacteremia Active Problems:   Septic shock (HCC)   Pressure injury of skin   Malnutrition of moderate degree   Encounter for feeding tube placement   Hypotension   Quadriplegia (HCC)   Infection due to ESBL-producing [name] pneumoniae   Chronic hepatitis C without hepatic coma (HCC)   Anemia   Jejunostomy tube leak (HCC)   Pleural effusion   Sinus arrest   Pacemaker   HFrEF (heart failure with reduced ejection fraction) (HCC)   Severe malnutrition (HCC)   Anasarca   Acute renal failure superimposed on stage 3b chronic kidney disease (HCC)   Septic shock sec to [name] bacteremia from UTI vs line infection: Patient presented with septic shock at admission. Hickman catheter removed, presumed to be the source of infection. Blood cultures grew [name] bacteremia. UA + Completed meropenem for 10 days from when lines were DC'd, repeat blood cultures negative. Patient remains afebrile, without leukocytosis. Plan was to receive PICC line once antibiotics completed. PICC line inserted 3/8 / Left arm iv line removed. Duplex : No  DVT. Sepsis physiology is improving.  AKI most likely ATN in setting of septic shock / Anasarca: Nephrology is following.  There is no urgent indication for dialysis at this time. Patient has received trials of albumin plus Lasix which was not effective. Nephrology increased Lasix to120 mg every 8 hours.  Added metolazone 5 mg daily Monitor BMP and urine output. Patient has good urine output 2.3 L/ 24 hr.  CHF Exacerbation (Combined systolic and diastolic): Patient remains with generalized body swelling, scrotal edema Echocardiogram LVEF 40 to 45%.  Global hypokinesis. Continue Lasix 120 mg every 8 hours as per nephro. Cardiology recommended continue aggressive diuresis.  Jejunostomy tube drainage> improved. Continue feeding through J tube. Appears to be at goal.  Hypotension likely related to orthostatic hypotension. Florinef was discontinued in the ICU, probably contributing to water retention. Midodrine has been stopped. BP been improving , now elevated.  Chronic vent dependent hypoxic respiratory failure: Due to quadriplegia. Continue current ventilatory support with trach.  PCCM helping with managing vent setting.   Multiple pressure ulcers/wounds present on admission: Continue Wound care  Chronic pain syndrome: Continue fentanyl patch. Oxycodone to prn  Depression, insomnia Continue Sertraline, trazodone. Continue clonazepam  GERD  Continue Pantroprazole  Anemia of chronic disease HGB drop to 6.8 from 7.1 Transfuse for HGB < 7, 2 U given Post transfusion Hb , 7.2> 7.2 No evidence of bleeding. Stool occult blood negative.  Left Arm swelling: Removed peripheral line from the left arm. Venous duplex negative for DVT.   DVT prophylaxis: Heparin sq Code Status: Full code. Family Communication:  No family at bed side. Disposition Plan:    Status is: Inpatient  Remains inpatient appropriate because:Inpatient level of care appropriate due to  severity of illness   Dispo: The patient is from: SNF              Anticipated d/c is to: LTAC              Patient currently is not medically stable to d/c.   Difficult to place patient No.  Awaiting transfer back to [facility] or select pending bed availability.  Consultants:   PCCM.  Procedures: Hickman removed.  Antimicrobials:  Anti-infectives (From admission, onward)   Start     Dose/Rate Route Frequency Ordered Stop   05/11/20 0800  meropenem (MERREM) 1 g in sodium chloride 0.9 % 100 mL IVPB        1 g 200 mL/hr over 30 Minutes Intravenous Every 8 hours 05/11/20 0707 05/12/20 1555   05/11/20 0715  meropenem (MERREM) 1 g in sodium chloride 0.9 % 100 mL IVPB  Status:  Discontinued        1 g 200 mL/hr over 30 Minutes Intravenous Every 8 hours 05/11/20 0706 05/11/20 0707   05/03/20 2200  meropenem (MERREM) 1 g in sodium chloride 0.9 % 100 mL IVPB  Status:  Discontinued        1 g 200 mL/hr over 30 Minutes Intravenous Every 12 hours 05/03/20 1041 05/11/20 0706   04/30/20 1145  meropenem (MERREM) 2 g in sodium chloride 0.9 % 100 mL IVPB  Status:  Discontinued        2 g 200 mL/hr over 30 Minutes Intravenous Every 12 hours 04/30/20 1052 05/03/20 1041   04/30/20 0530  ceFEPIme (MAXIPIME) 2 g in sodium chloride 0.9 % 100 mL IVPB  Status:  Discontinued        2 g 200 mL/hr over 30 Minutes Intravenous Every 12 hours 04/11/2020 1852 04/30/20 1051   04/30/2020 1852  vancomycin variable dose per unstable renal function (pharmacist dosing)  Status:  Discontinued         Does not apply See admin instructions 04/20/2020 1852 04/30/20 1051   04/06/2020 1630  vancomycin (VANCOREADY) IVPB 2000 mg/400 mL        2,000 mg 200 mL/hr over 120 Minutes Intravenous  Once 04/22/2020 1621 04/18/2020 2008   04/14/2020 1630  ceFEPIme (MAXIPIME) 2 g in sodium chloride 0.9 % 100 mL IVPB        2 g 200 mL/hr over 30 Minutes Intravenous  Once 04/10/2020 1621 04/13/2020 1749      Subjective: Patient was seen and  examined at bedside.  Overnight events noted. Patient still appeared swollen ,with scrotal swelling.  Patient has  Tracheostomy tube, on the vent with full mechanical support.   He has been on aggressive diuresis, with good urine output. Objective: Vitals:   05/13/20 0800 05/13/20 0830 05/13/20 0900 05/13/20 1000  BP: (!) 163/99  (!) 159/92 (!) 160/92  Pulse: 76  72 74  Resp: 14  (!) 6 (!) 0  Temp:      TempSrc:      SpO2: 94% 93% 94% 95%  Weight:      Height:        Intake/Output Summary (Last 24 hours) at 05/13/2020 1214 Last data filed at 05/13/2020 1000 Gross per 24 hour  Intake 1273.31 ml  Output 2000 ml  Net -726.69 ml   Filed Weights   05/11/20 0500 05/12/20 0437 05/13/20 0432  Weight: 114.5 kg 121 kg 118.8 kg    Examination:  General exam: Appears  calm and comfortable, chronically ill-appearing.  Not in any distress.  Patient has tracheostomy tube. Significantly swollen Respiratory system: Clear to auscultation. Respiratory effort normal. Cardiovascular system: S1 & S2 heard, RRR. No JVD, murmurs, rubs, gallops or clicks. No pedal edema. Gastrointestinal system: Abdomen is nondistended, soft and nontender. No organomegaly or masses felt.  Normal bowel sounds heard. Scrotal swelling ++ Central nervous system: Alert and oriented x 2.  Extremities: 2+ pitting edema.  No cyanosis no clubbing. Skin: No rashes, lesions or ulcers Psychiatry: Not assessed.     Data Reviewed: I have personally reviewed following labs and imaging studies  CBC: Recent Labs  Lab 05/07/20 0730 05/08/20 0611 05/10/20 0100 05/10/20 1241 05/11/20 0425 05/11/20 1230 05/11/20 2048 05/12/20 0432 05/13/20 0425  WBC 7.2 6.6 6.4  --  7.7  --   --  7.2  --   NEUTROABS 4.1  --   --   --   --   --   --   --   --   HGB 7.0* 7.0* 6.8*   < > 6.7* 10.0* 7.1* 7.2* 7.2*  HCT 22.8* 22.5* 22.3*   < > 22.0* 32.7* 21.0* 23.3* 22.6*  MCV 89.4 89.6 93.3  --  92.1  --   --  92.1  --   PLT 148* 160 147*  --   139*  --   --  156  --    < > = values in this interval not displayed.   Basic Metabolic Panel: Recent Labs  Lab 05/08/20 0611 05/09/20 0151 05/10/20 0100 05/11/20 0425 05/11/20 2048 05/12/20 0432 05/13/20 0425  NA 137 137 137 137 136 136 140  K 3.9 4.1 4.4 4.4 4.4 4.4 4.4  CL 100 101 101 101  --  101 101  CO2 '22 24 22 24  ' --  25 27  GLUCOSE 120* 124* 122* 125*  --  119* 128*  BUN 130* 128* 141* 152*  --  158* 164*  CREATININE 2.42* 2.39* 2.39* 2.30*  --  2.20* 2.17*  CALCIUM 8.7* 8.6* 8.6* 9.0  --  8.7* 8.8*  MG 1.8  --  1.8 1.7  --  1.8  --   PHOS 6.0* 6.4* 7.4*  7.3* 7.5*  --  7.7* 8.0*   GFR: Estimated Creatinine Clearance: 54.5 mL/min (A) (by C-G formula based on SCr of 2.17 mg/dL (H)). Liver Function Tests: Recent Labs  Lab 05/07/20 0730 05/08/20 0611 05/09/20 0151 05/10/20 0100 05/11/20 0425 05/12/20 0432 05/13/20 0425  AST 22 36  --   --  36  --   --   ALT 12 16  --   --  20  --   --   ALKPHOS 158* 191*  --   --  217*  --   --   BILITOT 0.8 1.0  --   --  0.6  --   --   PROT 6.3* 6.1*  --   --  6.3*  --   --   ALBUMIN 2.4*  2.4* 2.5* 2.1* 2.1* 2.0* 1.9* 1.8*   No results for input(s): LIPASE, AMYLASE in the last 168 hours. No results for input(s): AMMONIA in the last 168 hours. Coagulation Profile: Recent Labs  Lab 05/11/20 0637  INR 1.2   Cardiac Enzymes: No results for input(s): CKTOTAL, CKMB, CKMBINDEX, TROPONINI in the last 168 hours. BNP (last 3 results) No results for input(s): PROBNP in the last 8760 hours. HbA1C: No results for input(s): HGBA1C in the last 72 hours.  CBG: Recent Labs  Lab 05/12/20 1939 05/12/20 2329 05/13/20 0330 05/13/20 0805 05/13/20 1127  GLUCAP 120* 122* 105* 124* 113*   Lipid Profile: No results for input(s): CHOL, HDL, LDLCALC, TRIG, CHOLHDL, LDLDIRECT in the last 72 hours. Thyroid Function Tests: No results for input(s): TSH, T4TOTAL, FREET4, T3FREE, THYROIDAB in the last 72 hours. Anemia Panel: Recent  Labs    05/11/20 0425  TIBC 118*  IRON 33*   Sepsis Labs: No results for input(s): PROCALCITON, LATICACIDVEN in the last 168 hours.  Recent Results (from the past 240 hour(s))  Culture, blood (Routine X 2) w Reflex to ID Panel     Status: None (Preliminary result)   Collection Time: 05/08/20 12:44 PM   Specimen: BLOOD RIGHT HAND  Result Value Ref Range Status   Specimen Description BLOOD RIGHT HAND  Final   Special Requests   Final    BOTTLES DRAWN AEROBIC ONLY Blood Culture results may not be optimal due to an excessive volume of blood received in culture bottles   Culture   Final    NO GROWTH 4 DAYS Performed at [facility], [place] [address] [place], [place] [address]    Report Status PENDING  Incomplete  Culture, blood (Routine X 2) w Reflex to ID Panel     Status: None (Preliminary result)   Collection Time: 05/08/20 12:45 PM   Specimen: BLOOD LEFT HAND  Result Value Ref Range Status   Specimen Description BLOOD LEFT HAND  Final   Special Requests   Final    BOTTLES DRAWN AEROBIC ONLY Blood Culture results may not be optimal due to an excessive volume of blood received in culture bottles   Culture   Final    NO GROWTH 4 DAYS Performed at [facility], [place] [address] [place], [place] [address]    Report Status PENDING  Incomplete    Radiology Studies: DG CHEST PORT 1 VIEW  Result Date: 05/12/2020 CLINICAL DATA:  Shortness of breath EXAM: PORTABLE CHEST 1 VIEW COMPARISON:  None. FINDINGS: Tracheostomy device is present.  Right PICC line tip overlies SVC. Persistent bilateral pleural effusions with bibasilar atelectasis. Left lung aeration is improved. Persistent mild perihilar edema. Similar cardiomediastinal contours. IMPRESSION: Persistent bilateral pleural effusions and bibasilar atelectasis. Persistent but improved probable pulmonary edema. Electronically Signed   By: Macy Mis M.D.   On: 05/12/2020 16:53   DG CHEST PORT 1 VIEW  Result Date:  05/11/2020 CLINICAL DATA:  Shortness of breath, unable to elevate chin EXAM: PORTABLE CHEST 1 VIEW COMPARISON:  Radiograph 05/08/2020 FINDINGS: Tracheostomy tube tip terminates in the mid trachea. Right upper extremity PICC tip terminates near the superior cavoatrial junction. Telemetry leads overlie the chest. Lung volumes are diminished from comparison exam. Increasing hazy interstitial opacities with a patchy perihilar and basilar predominance and gradient density obscuring the hemidiaphragms likely reflecting worsening pulmonary edema with increasing layering bilateral effusions accentuated by low volumes. Prominence of the cardiomediastinal silhouette, poorly visualized due to overlying opacity. Chin overlies the lung apices. No acute osseous or soft tissue abnormality. IMPRESSION: Worsening pulmonary edema and bilateral effusions. Support devices as above. Electronically Signed   By: Lovena Le M.D.   On: 05/11/2020 20:36   DG Shoulder Left  Result Date: 05/12/2020 CLINICAL DATA:  Left shoulder pain and bruising. EXAM: LEFT SHOULDER - 2+ VIEW COMPARISON:  None. FINDINGS: There is no evidence of fracture or dislocation. There is no evidence of arthropathy or other focal bone abnormality. Soft tissues  are unremarkable. IMPRESSION: Negative exam. Electronically Signed   By: Inge Rise M.D.   On: 05/12/2020 15:57   Scheduled Meds: . amLODipine  5 mg Per Tube Daily  . chlorhexidine gluconate (MEDLINE KIT)  15 mL Mouth Rinse BID  . Chlorhexidine Gluconate Cloth  6 each Topical Daily  . clonazePAM  1 mg Per Tube TID  . darbepoetin (ARANESP) injection - NON-DIALYSIS  60 mcg Subcutaneous Q Mon-1800  . feeding supplement (PROSource TF)  90 mL Per Tube TID  . fentaNYL  1 patch Transdermal Q72H   And  . fentaNYL  1 patch Transdermal Q72H  . heparin injection (subcutaneous)  5,000 Units Subcutaneous Q8H  . insulin aspart  0-9 Units Subcutaneous Q4H  . levothyroxine  125 mcg Per Tube Q0600  . mouth  rinse  15 mL Mouth Rinse 10 times per day  . metolazone  5 mg Per Tube Daily  . multivitamin with minerals  1 tablet Per Tube Daily  . nutrition supplement (JUVEN)  1 packet Per Tube BID BM  . pantoprazole sodium  40 mg Per Tube Daily  . sertraline  100 mg Per Tube Daily  . sodium chloride flush  10-40 mL Intracatheter Q12H  . sodium chloride flush  10-40 mL Intracatheter Q12H  . traZODone  100 mg Per Tube QHS   Continuous Infusions: . feeding supplement (OSMOLITE 1.5 CAL) 60 mL/hr at 05/13/20 0600  . furosemide 62 mL/hr at 05/13/20 1000     LOS: 14 days    Time spent: 35 mins.   Shawna Clamp, MD [facility]   If 7PM-7AM, please contact night-coverage

## 2020-05-13 NOTE — Progress Notes (Signed)
NAME:  Phillip Klein, MRN:  [id number], DOB:  1965/07/15, LOS: 14 ADMISSION DATE:  04/25/2020, CONSULTATION DATE:  2/27 REFERRING MD:  Silverio Lay, CHIEF COMPLAINT:  Sepsis   Brief History:  55 yo male who presented from [facility]  Admitted 2/24 with septic shock, ESBL [name]  Hickman catheter removed  Oliguric AKI , baseline creatinine 1.8   Past Medical History:  Chronic hypoxic respiratory failure/ tstomy/vent since 2021 after c spine surgery Hx of MRSA GERD Type 2 diabetes Anemia of chronic illness Osteomyelitis of cervical spine  Quadriplegia Spinal epidural abscess C2-C7 and T9-L4 Malnutrition Neurogenic astrostatic hypotension  HFrEF Untreated HCV Previous IVDU Cirrhosis  Chronic pain  Significant Hospital Events:  Admitted 2/24 3/2 albumin 3/3 lasix 3/5 trial of SIMV/PS   Consults:  CCM ID renal 3/2  Procedures:  Trach present prior to admission Hickman catheter prior to admission > removed 2/26  Significant Diagnostic Tests:    Micro Data:  2/24 blood > [name] ESBL 1/4 bottle 2/26 urine > multiple species   Antimicrobials:  2/24 cefepime> 2/25 2/24 vanc > 2/25  2/25 mero >  3/9  Interim History / Subjective:  No sig change.  PCCM following 1-2x/week Renal following - attempting diuresis   Objective   Blood pressure (!) 163/95, pulse 71, temperature (!) 94 F (34.4 C), temperature source Axillary, resp. rate (!) 0, height 6\' 2"  (1.88 m), weight 118.8 kg, SpO2 93 %.    Vent Mode: PRVC FiO2 (%):  [60 %-75 %] 70 % Set Rate:  [18 bmp-20 bmp] 20 bmp Vt Set:  [630 mL-650 mL] 630 mL PEEP:  [8 cmH20] 8 cmH20 Plateau Pressure:  [27 cmH20-32 cmH20] 32 cmH20   Intake/Output Summary (Last 24 hours) at 05/13/2020 07/13/2020 Last data filed at 05/13/2020 0600 Gross per 24 hour  Intake 1114.93 ml  Output 2175 ml  Net -1060.07 ml   Filed Weights   05/11/20 0500 05/12/20 0437 05/13/20 0432  Weight: 114.5 kg 121 kg 118.8 kg     Examination:  General: Chronically ill-appearing, in bed on vent, full mechanical support, NAD HENT: NCAT tracheostomy in place and secure, minimal secretions PULM: resps even non labored  CV: RRR GI: Soft, nontender MSK: no bulk or tone, foot drop, foot braces are on Neuro: Able to mouth words, awake and interactive Anasarca ++   Resolved Hospital Problem list   Septic shock  Assessment & Plan:  [name] bacteremia from UTI vs line infection Hickman catheter removed Has pacemaker S/p 10 day course meropenem  repeat blood cultures negative   AKI likely ATN in setting of septic shock Anasarca Renal following - attempting diuresis  SCr trending down  Monitor BMET and UOP  Jejunostomy tube drainage> improved Continue feeding through J tube At goal  Hypotension likely related to dysautonomia Midodrine has been stopped  Chronic respiratory failure with hypoxemia Attempting SIMV/pressure support while in the hospital under observation, if not tolerated can go back to assist control VAP prevention  Chronic pain Continue fentanyl patch Reduced oxycodone to prn  Depression, insomnia Sertraline, trazodone per home doses Continue clonazepam   Anemia of chronic disease HGB drop to 6.8 from 7.1 Plan Transfuse for HGB < 7, 1 U given No evidence of bleeding   [facility] - PCCM will continue to f/u 1-2x/week while in hospital   Best practice (evaluated daily)  Diet: tube feeding Pain/Anxiety/Delirium protocol (if indicated): Fentanyl patch VAP protocol (if indicated): yes DVT prophylaxis:  sub q heparin GI prophylaxis: n/a Glucose  control: SSI Mobility: bed rest Disposition: Plan is for back to vent SNF when bed available  Goals of Care:  Last date of multidisciplinary goals of care discussion:3/3  Family and staff present: wife, bedside nurse, patient, [name] Summary of discussion: "keep me alive at all costs" Follow up goals of care discussion  due: 3/10   Code Status: full  Labs   CBC: Recent Labs  Lab 05/07/20 0730 05/08/20 0611 05/10/20 0100 05/10/20 1241 05/11/20 0425 05/11/20 1230 05/11/20 2048 05/12/20 0432 05/13/20 0425  WBC 7.2 6.6 6.4  --  7.7  --   --  7.2  --   NEUTROABS 4.1  --   --   --   --   --   --   --   --   HGB 7.0* 7.0* 6.8*   < > 6.7* 10.0* 7.1* 7.2* 7.2*  HCT 22.8* 22.5* 22.3*   < > 22.0* 32.7* 21.0* 23.3* 22.6*  MCV 89.4 89.6 93.3  --  92.1  --   --  92.1  --   PLT 148* 160 147*  --  139*  --   --  156  --    < > = values in this interval not displayed.    Basic Metabolic Panel: Recent Labs  Lab 05/08/20 0611 05/09/20 0151 05/10/20 0100 05/11/20 0425 05/11/20 2048 05/12/20 0432 05/13/20 0425  NA 137 137 137 137 136 136 140  K 3.9 4.1 4.4 4.4 4.4 4.4 4.4  CL 100 101 101 101  --  101 101  CO2 22 24 22 24   --  25 27  GLUCOSE 120* 124* 122* 125*  --  119* 128*  BUN 130* 128* 141* 152*  --  158* 164*  CREATININE 2.42* 2.39* 2.39* 2.30*  --  2.20* 2.17*  CALCIUM 8.7* 8.6* 8.6* 9.0  --  8.7* 8.8*  MG 1.8  --  1.8 1.7  --  1.8  --   PHOS 6.0* 6.4* 7.4*  7.3* 7.5*  --  7.7* 8.0*   GFR: Estimated Creatinine Clearance: 54.5 mL/min (A) (by C-G formula based on SCr of 2.17 mg/dL (H)). Recent Labs  Lab 05/08/20 0611 05/10/20 0100 05/11/20 0425 05/12/20 0432  WBC 6.6 6.4 7.7 7.2    Liver Function Tests: Recent Labs  Lab 05/07/20 0730 05/08/20 0611 05/09/20 0151 05/10/20 0100 05/11/20 0425 05/12/20 0432 05/13/20 0425  AST 22 36  --   --  36  --   --   ALT 12 16  --   --  20  --   --   ALKPHOS 158* 191*  --   --  217*  --   --   BILITOT 0.8 1.0  --   --  0.6  --   --   PROT 6.3* 6.1*  --   --  6.3*  --   --   ALBUMIN 2.4*  2.4* 2.5* 2.1* 2.1* 2.0* 1.9* 1.8*   No results for input(s): LIPASE, AMYLASE in the last 168 hours. No results for input(s): AMMONIA in the last 168 hours.  ABG    Component Value Date/Time   PHART 7.493 (H) 05/11/2020 2048   PCO2ART 32.3 05/11/2020  2048   PO2ART 100 05/11/2020 2048   HCO3 24.9 05/11/2020 2048   TCO2 26 05/11/2020 2048   ACIDBASEDEF 7.0 (H) 05/03/20 1639   O2SAT 98.0 05/11/2020 2048     Coagulation Profile: Recent Labs  Lab 05/11/20 0637  INR 1.2    Cardiac  Enzymes: No results for input(s): CKTOTAL, CKMB, CKMBINDEX, TROPONINI in the last 168 hours.  HbA1C: Hgb A1c MFr Bld  Date/Time Value Ref Range Status  29-May-2020 10:00 PM 5.1 4.8 - 5.6 % Final    Comment:    (NOTE) Pre diabetes:          5.7%-6.4%  Diabetes:              >6.4%  Glycemic control for   <7.0% adults with diabetes     CBG: Recent Labs  Lab 05/12/20 1605 05/12/20 1939 05/12/20 2329 05/13/20 0330 05/13/20 0805  GLUCAP 124* 120* 122* 105* 124*      Dirk Dress, NP Pulmonary/Critical Care Medicine  05/13/2020  9:06 AM
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
## 2020-05-13 NOTE — Progress Notes (Signed)
Patient's temperature is 04.3 axillary. Patient still refuses any direct intervention on his behalf. Temperature in room was increased to 75.

## 2020-05-13 NOTE — Progress Notes (Signed)
Unable to obtain temp, axillary or oral. Patient refuse to roll again, therefor initiated warming blanket.

## 2020-05-13 NOTE — Progress Notes (Signed)
Progress Note  Patient Name: Phillip Klein Date of Encounter: 05/13/2020  [facility] Cardiologist: Sanda Klein, MD   Subjective   Alert, conversant, but prefers to be left alone. Refusing to be cleaned up. Net diuresis approx 1 liter/shift. NSR 70s. Remains mildy hypertensive.  Inpatient Medications    Scheduled Meds: . amLODipine  5 mg Per Tube Daily  . chlorhexidine gluconate (MEDLINE KIT)  15 mL Mouth Rinse BID  . Chlorhexidine Gluconate Cloth  6 each Topical Daily  . clonazePAM  1 mg Per Tube TID  . darbepoetin (ARANESP) injection - NON-DIALYSIS  60 mcg Subcutaneous Q Mon-1800  . feeding supplement (PROSource TF)  90 mL Per Tube TID  . fentaNYL  1 patch Transdermal Q72H   And  . fentaNYL  1 patch Transdermal Q72H  . heparin injection (subcutaneous)  5,000 Units Subcutaneous Q8H  . insulin aspart  0-9 Units Subcutaneous Q4H  . levothyroxine  125 mcg Per Tube Q0600  . mouth rinse  15 mL Mouth Rinse 10 times per day  . metolazone  5 mg Per Tube Daily  . multivitamin with minerals  1 tablet Per Tube Daily  . nutrition supplement (JUVEN)  1 packet Per Tube BID BM  . pantoprazole sodium  40 mg Per Tube Daily  . sertraline  100 mg Per Tube Daily  . sodium chloride flush  10-40 mL Intracatheter Q12H  . sodium chloride flush  10-40 mL Intracatheter Q12H  . traZODone  100 mg Per Tube QHS   Continuous Infusions: . feeding supplement (OSMOLITE 1.5 CAL) 60 mL/hr at 05/13/20 0600  . furosemide 120 mg (05/13/20 0856)   PRN Meds: albuterol, docusate, fentaNYL (SUBLIMAZE) injection, oxyCODONE, polyethylene glycol, prochlorperazine, sodium chloride flush, sodium chloride flush   Vital Signs    Vitals:   05/13/20 0500 05/13/20 0600 05/13/20 0700 05/13/20 0830  BP: (!) 153/83 (!) 162/88 (!) 163/95   Pulse: 92 76 71   Resp: (!) 0 (!) 0 (!) 0   Temp:   (!) 94 F (34.4 C)   TempSrc:   Axillary   SpO2: 95% 95% 94% 93%  Weight:      Height:        Intake/Output  Summary (Last 24 hours) at 05/13/2020 1021 Last data filed at 05/13/2020 0910 Gross per 24 hour  Intake 1054.93 ml  Output 2450 ml  Net -1395.07 ml   Last 3 Weights 05/13/2020 05/12/2020 05/11/2020  Weight (lbs) 261 lb 14.5 oz 266 lb 12.1 oz 252 lb 6.8 oz  Weight (kg) 118.8 kg 121 kg 114.5 kg      Telemetry    NSR - Personally Reviewed  ECG    No new tracing - Personally Reviewed  Physical Exam  Hypothermic, refusing blankets GEN: No acute distress.   Neck: cannot see JVD Cardiac: RRR, no murmurs, rubs, or gallops.  Respiratory: Clear to auscultation bilaterally. GI: Soft, nontender, non-distended  MS: generalized edema; No deformity. Neuro:  quadriplegic  Psych: Normal affect   Labs    High Sensitivity Troponin:   Recent Labs  Lab 04/16/2020 1606 04/23/2020 2200  TROPONINIHS 15 31*      Chemistry Recent Labs  Lab 05/07/20 0730 05/08/20 0611 05/09/20 0151 05/11/20 0425 05/11/20 2048 05/12/20 0432 05/13/20 0425  NA 139  136 137   < > 137 136 136 140  K 3.9  3.8 3.9   < > 4.4 4.4 4.4 4.4  CL 103  101 100   < > 101  --  101 101  CO2 22  21* 22   < > 24  --  25 27  GLUCOSE 117*  114* 120*   < > 125*  --  119* 128*  BUN 136*  136* 130*   < > 152*  --  158* 164*  CREATININE 2.62*  2.61* 2.42*   < > 2.30*  --  2.20* 2.17*  CALCIUM 8.7*  8.6* 8.7*   < > 9.0  --  8.7* 8.8*  PROT 6.3* 6.1*  --  6.3*  --   --   --   ALBUMIN 2.4*  2.4* 2.5*   < > 2.0*  --  1.9* 1.8*  AST 22 36  --  36  --   --   --   ALT 12 16  --  20  --   --   --   ALKPHOS 158* 191*  --  217*  --   --   --   BILITOT 0.8 1.0  --  0.6  --   --   --   GFRNONAA 29*  29* 31*   < > 33*  --  35* 36*  ANIONGAP '14  14 15   ' < > 12  --  10 12   < > = values in this interval not displayed.     Hematology Recent Labs  Lab 05/10/20 0100 05/10/20 1241 05/11/20 0425 05/11/20 1230 05/11/20 2048 05/12/20 0432 05/13/20 0425  WBC 6.4  --  7.7  --   --  7.2  --   RBC 2.39*  --  2.39*  --   --  2.53*   --   HGB 6.8*   < > 6.7*   < > 7.1* 7.2* 7.2*  HCT 22.3*   < > 22.0*   < > 21.0* 23.3* 22.6*  MCV 93.3  --  92.1  --   --  92.1  --   MCH 28.5  --  28.0  --   --  28.5  --   MCHC 30.5  --  30.5  --   --  30.9  --   RDW 16.6*  --  16.7*  --   --  16.8*  --   PLT 147*  --  139*  --   --  156  --    < > = values in this interval not displayed.    BNPNo results for input(s): BNP, PROBNP in the last 168 hours.   DDimer No results for input(s): DDIMER in the last 168 hours.   Radiology    DG CHEST PORT 1 VIEW  Result Date: 05/12/2020 CLINICAL DATA:  Shortness of breath EXAM: PORTABLE CHEST 1 VIEW COMPARISON:  None. FINDINGS: Tracheostomy device is present.  Right PICC line tip overlies SVC. Persistent bilateral pleural effusions with bibasilar atelectasis. Left lung aeration is improved. Persistent mild perihilar edema. Similar cardiomediastinal contours. IMPRESSION: Persistent bilateral pleural effusions and bibasilar atelectasis. Persistent but improved probable pulmonary edema. Electronically Signed   By: Macy Mis M.D.   On: 05/12/2020 16:53   DG CHEST PORT 1 VIEW  Result Date: 05/11/2020 CLINICAL DATA:  Shortness of breath, unable to elevate chin EXAM: PORTABLE CHEST 1 VIEW COMPARISON:  Radiograph 05/08/2020 FINDINGS: Tracheostomy tube tip terminates in the mid trachea. Right upper extremity PICC tip terminates near the superior cavoatrial junction. Telemetry leads overlie the chest. Lung volumes are diminished from comparison exam. Increasing hazy interstitial opacities with a patchy perihilar and basilar predominance and gradient  density obscuring the hemidiaphragms likely reflecting worsening pulmonary edema with increasing layering bilateral effusions accentuated by low volumes. Prominence of the cardiomediastinal silhouette, poorly visualized due to overlying opacity. Chin overlies the lung apices. No acute osseous or soft tissue abnormality. IMPRESSION: Worsening pulmonary edema and  bilateral effusions. Support devices as above. Electronically Signed   By: Lovena Le M.D.   On: 05/11/2020 20:36   DG Shoulder Left  Result Date: 05/12/2020 CLINICAL DATA:  Left shoulder pain and bruising. EXAM: LEFT SHOULDER - 2+ VIEW COMPARISON:  None. FINDINGS: There is no evidence of fracture or dislocation. There is no evidence of arthropathy or other focal bone abnormality. Soft tissues are unremarkable. IMPRESSION: Negative exam. Electronically Signed   By: Inge Rise M.D.   On: 05/12/2020 15:57    Cardiac Studies   ECHO:04/30/2020 1. Left ventricular ejection fraction, by estimation, is 45 to 50%. The  left ventricle has mildly decreased function. The left ventricle  demonstrates global hypokinesis. Left ventricular diastolic parameters are  consistent with Grade II diastolic  dysfunction (pseudonormalization).  2. Right ventricular systolic function is moderately reduced.  3. The pericardial effusion is anterior to the right ventricle and  circumferential.   ECHO:06/08/2019 INTERPRETATION ---------------------------------------------------------------  NORMAL LEFT VENTRICULAR SYSTOLIC FUNCTION WITH MILD LVH  MILD RV SYSTOLIC DYSFUNCTION (See above)  VALVULAR REGURGITATION: TRIVIAL MR, TRIVIAL TR  NO VALVULAR STENOSIS  INSUFFICIENT TR TO ESTIMATED RVSP    Patient Profile     55 y.o. 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  Assessment & Plan    1. Anasarca: due to aggressive fluid resuscitation for sepsis on background of severe malnutrition/hypoalbuminemia and fludrocortisone therapy, further complicated by acute on chronic renal insufficiency and mildly reduced LVEF. Now hypertensive, 25 lb above admission weight, starting to make some progress w diuresis. Fludrocortisone effect will gradually dissipate over as much as 2 weeks. Continue diuresis w loop/thiazide combo. 2. HFrEF: may be mostly due to RV apical pacing related dyssynchrony and loss of normal AV association.  Echo in 08/21 with normal LVEF. Awaiting a repeat limited echo. 3. Ac on chr renal insuff: diuretic Rx per Nephrology. BUN remains severely elevated (autocatabolism?), but creat w slight improving trend. 4. 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 Will only need backup pacing infrequently. Lower rate limit reduced to 50. 5. Sepsis: resolved. 6. Dysautonomia: may need to resume fludrocortisone and/or midodrine once diuresed to "dry weight". 7. HTN: would not treat this any more aggressively - should improve gradually w diuresis and resolution of fludrocortisone effect. 8. Quadriplegia  CRITICAL CARE Performed by: Dani Gobble Bianca Raneri   Total critical care time: 40 minutes  Critical care time was exclusive of separately billable procedures and treating other patients.  Critical care was necessary to treat or prevent imminent or life-threatening deterioration.  Critical care was time spent personally by me on the following activities: development of treatment plan with patient and/or surrogate as well as nursing, discussions with consultants, evaluation of patient's response to treatment, examination of patient,  obtaining history from patient or surrogate, ordering and performing treatments and interventions, ordering and review of  laboratory studies, ordering and review of radiographic studies, pulse oximetry and re-evaluation of patient's condition.      For questions or updates, please contact [place] Please consult [email or web address] for contact info under        Signed, Sanda Klein, MD  05/13/2020, 10:21 AM

## 2020-05-13 NOTE — TOC Progression Note (Addendum)
Transition of Care [facility]) - Progression Note    Patient Details  Name: Phillip Klein MRN: [id number] Date of Birth: March 31, 1965  [facility] CM/SW Contact  Epifanio Lesches, RN Phone Number: 05/13/2020, 2:59 PM  Clinical Narrative:    NCM received call from [facility]/ [facility]( [phone]). [facility] shared unable to accept pt for LTAC 2/2 pt without enough Medicare days. Stated she did speak with [facility] director Cassie regarding case. Cassie informed [facility] pt [facility] once medically stable. NCM awaiting call back from [facility] to determine what they deem as medical readiness to accept pt ....  Irving Burton shared pt's Medicaid application pending...  On 3/2 DMA 1000 showed per portal  MID # [id number] N, PA # [id number]. Pt with  [place] Medicaid...pays part B Medicare premium.  TOC team will continue to monitor and assist with TOC needs.   Expected Discharge Plan: Skilled Nursing Facility Barriers to Discharge: No SNF bed  Expected Discharge Plan and Services Expected Discharge Plan: Skilled Nursing Facility                                               Social Determinants of Health (SDOH) Interventions    Readmission Risk Interventions No flowsheet data found.

## 2020-05-13 NOTE — Progress Notes (Signed)
Patient ID: Phillip Klein, male   DOB: 1966-01-30, 55 y.o.   MRN: [id number] S: Urine output fairly good over the past 24 hours finally net negative.  No other changes  O:BP (!) 163/95 (BP Location: Left Leg)   Pulse 71   Temp (!) 94 F (34.4 C) (Axillary)   Resp (!) 0   Ht '6\' 2"'  (1.88 m)   Wt 118.8 kg   SpO2 93%   BMI 33.63 kg/m   Intake/Output Summary (Last 24 hours) at 05/13/2020 1036 Last data filed at 05/13/2020 0910 Gross per 24 hour  Intake 1054.93 ml  Output 2100 ml  Net -1045.07 ml   Intake/Output: I/O last 3 completed shifts: In: 1412.1 [I.V.:130; Other:30; NG/GT:800; IV Piggyback:452.1] Out: 3550 [Urine:2750; Stool:800]  Intake/Output this shift:  Total I/O In: 10 [I.V.:10] Out: 275 [Urine:275] Weight change: -2.2 kg Gen: On vent via trach in NAD CVS: Normal rate Resp: decreased BS at bases Abd: +BS, soft, NT, +PEG Ext: 2+ anasarca  Recent Labs  Lab 05/07/20 0730 05/08/20 0611 05/09/20 0151 05/10/20 0100 05/11/20 0425 05/11/20 2048 05/12/20 0432 05/13/20 0425  NA 139  136 137 137 137 137 136 136 140  K 3.9  3.8 3.9 4.1 4.4 4.4 4.4 4.4 4.4  CL 103  101 100 101 101 101  --  101 101  CO2 22  21* '22 24 22 24  ' --  25 27  GLUCOSE 117*  114* 120* 124* 122* 125*  --  119* 128*  BUN 136*  136* 130* 128* 141* 152*  --  158* 164*  CREATININE 2.62*  2.61* 2.42* 2.39* 2.39* 2.30*  --  2.20* 2.17*  ALBUMIN 2.4*  2.4* 2.5* 2.1* 2.1* 2.0*  --  1.9* 1.8*  CALCIUM 8.7*  8.6* 8.7* 8.6* 8.6* 9.0  --  8.7* 8.8*  PHOS 6.0* 6.0* 6.4* 7.4*  7.3* 7.5*  --  7.7* 8.0*  AST 22 36  --   --  36  --   --   --   ALT 12 16  --   --  20  --   --   --    Liver Function Tests: Recent Labs  Lab 05/07/20 0730 05/08/20 0611 05/09/20 0151 05/11/20 0425 05/12/20 0432 05/13/20 0425  AST 22 36  --  36  --   --   ALT 12 16  --  20  --   --   ALKPHOS 158* 191*  --  217*  --   --   BILITOT 0.8 1.0  --  0.6  --   --   PROT 6.3* 6.1*  --  6.3*  --   --   ALBUMIN 2.4*   2.4* 2.5*   < > 2.0* 1.9* 1.8*   < > = values in this interval not displayed.   No results for input(s): LIPASE, AMYLASE in the last 168 hours. No results for input(s): AMMONIA in the last 168 hours. CBC: Recent Labs  Lab 05/07/20 0730 05/08/20 0611 05/10/20 0100 05/10/20 1241 05/11/20 0425 05/11/20 1230 05/11/20 2048 05/12/20 0432 05/13/20 0425  WBC 7.2 6.6 6.4  --  7.7  --   --  7.2  --   NEUTROABS 4.1  --   --   --   --   --   --   --   --   HGB 7.0* 7.0* 6.8*   < > 6.7*   < > 7.1* 7.2* 7.2*  HCT 22.8* 22.5* 22.3*   < >  22.0*   < > 21.0* 23.3* 22.6*  MCV 89.4 89.6 93.3  --  92.1  --   --  92.1  --   PLT 148* 160 147*  --  139*  --   --  156  --    < > = values in this interval not displayed.   Cardiac Enzymes: No results for input(s): CKTOTAL, CKMB, CKMBINDEX, TROPONINI in the last 168 hours. CBG: Recent Labs  Lab 05/12/20 1605 05/12/20 1939 05/12/20 2329 05/13/20 0330 05/13/20 0805  GLUCAP 124* 120* 122* 105* 124*    Iron Studies:  Recent Labs    05/11/20 0425  IRON 33*  TIBC 118*   Studies/Results: DG CHEST PORT 1 VIEW  Result Date: 05/12/2020 CLINICAL DATA:  Shortness of breath EXAM: PORTABLE CHEST 1 VIEW COMPARISON:  None. FINDINGS: Tracheostomy device is present.  Right PICC line tip overlies SVC. Persistent bilateral pleural effusions with bibasilar atelectasis. Left lung aeration is improved. Persistent mild perihilar edema. Similar cardiomediastinal contours. IMPRESSION: Persistent bilateral pleural effusions and bibasilar atelectasis. Persistent but improved probable pulmonary edema. Electronically Signed   By: Macy Mis M.D.   On: 05/12/2020 16:53   DG CHEST PORT 1 VIEW  Result Date: 05/11/2020 CLINICAL DATA:  Shortness of breath, unable to elevate chin EXAM: PORTABLE CHEST 1 VIEW COMPARISON:  Radiograph 05/08/2020 FINDINGS: Tracheostomy tube tip terminates in the mid trachea. Right upper extremity PICC tip terminates near the superior cavoatrial  junction. Telemetry leads overlie the chest. Lung volumes are diminished from comparison exam. Increasing hazy interstitial opacities with a patchy perihilar and basilar predominance and gradient density obscuring the hemidiaphragms likely reflecting worsening pulmonary edema with increasing layering bilateral effusions accentuated by low volumes. Prominence of the cardiomediastinal silhouette, poorly visualized due to overlying opacity. Chin overlies the lung apices. No acute osseous or soft tissue abnormality. IMPRESSION: Worsening pulmonary edema and bilateral effusions. Support devices as above. Electronically Signed   By: Lovena Le M.D.   On: 05/11/2020 20:36   DG Shoulder Left  Result Date: 05/12/2020 CLINICAL DATA:  Left shoulder pain and bruising. EXAM: LEFT SHOULDER - 2+ VIEW COMPARISON:  None. FINDINGS: There is no evidence of fracture or dislocation. There is no evidence of arthropathy or other focal bone abnormality. Soft tissues are unremarkable. IMPRESSION: Negative exam. Electronically Signed   By: Inge Rise M.D.   On: 05/12/2020 15:57   . amLODipine  5 mg Per Tube Daily  . chlorhexidine gluconate (MEDLINE KIT)  15 mL Mouth Rinse BID  . Chlorhexidine Gluconate Cloth  6 each Topical Daily  . clonazePAM  1 mg Per Tube TID  . darbepoetin (ARANESP) injection - NON-DIALYSIS  60 mcg Subcutaneous Q Mon-1800  . feeding supplement (PROSource TF)  90 mL Per Tube TID  . fentaNYL  1 patch Transdermal Q72H   And  . fentaNYL  1 patch Transdermal Q72H  . heparin injection (subcutaneous)  5,000 Units Subcutaneous Q8H  . insulin aspart  0-9 Units Subcutaneous Q4H  . levothyroxine  125 mcg Per Tube Q0600  . mouth rinse  15 mL Mouth Rinse 10 times per day  . metolazone  5 mg Per Tube Daily  . multivitamin with minerals  1 tablet Per Tube Daily  . nutrition supplement (JUVEN)  1 packet Per Tube BID BM  . pantoprazole sodium  40 mg Per Tube Daily  . sertraline  100 mg Per Tube Daily  .  sodium chloride flush  10-40 mL Intracatheter Q12H  . sodium  chloride flush  10-40 mL Intracatheter Q12H  . traZODone  100 mg Per Tube QHS    BMET    Component Value Date/Time   NA 140 05/13/2020 0425   K 4.4 05/13/2020 0425   CL 101 05/13/2020 0425   CO2 27 05/13/2020 0425   GLUCOSE 128 (H) 05/13/2020 0425   BUN 164 (H) 05/13/2020 0425   CREATININE 2.17 (H) 05/13/2020 0425   CALCIUM 8.8 (L) 05/13/2020 0425   GFRNONAA 36 (L) 05/13/2020 0425   CBC    Component Value Date/Time   WBC 7.2 05/12/2020 0432   RBC 2.53 (L) 05/12/2020 0432   HGB 7.2 (L) 05/13/2020 0425   HCT 22.6 (L) 05/13/2020 0425   PLT 156 05/12/2020 0432   MCV 92.1 05/12/2020 0432   MCH 28.5 05/12/2020 0432   MCHC 30.9 05/12/2020 0432   RDW 16.8 (H) 05/12/2020 0432   LYMPHSABS 1.6 05/07/2020 0730   MONOABS 0.4 05/07/2020 0730   EOSABS 1.0 (H) 05/07/2020 0730   BASOSABS 0.0 05/07/2020 0730    Assessment/Plan: 1. AKI - likely ischemic ATN in setting of septic shock requiring pressors. Has chronic hypotension but blood pressure higher recently.  Creatinine is downtrending.  BUN is higher which may be a combination of an accurate creatinine, protein loading, catabolic state.  No signs of uremia at this time 1. Furosemide 120 mg 3 times daily 2. Continue metolazone 5 mg daily for now 3. Cardiology following, appreciate assistance 4. IV albumin as needed; 25 g today 5. Will need Nephrology follow up at [facility] or [facility] once bed available for transfer. 2. Sepsis due to klebsiella bacteremia -significantly improved.  Management per primary 3. Chronic hypotension: Multifactorial with dysautonomia contributing.  Pressure significantly improved despite holding Florinef and midodrine.  Blood pressure now on the high side.  Continue to monitor 4. Severe protein malnutrition - tube feeds, nutrition assisting.  Rising BUN likely indicates improved protein nutrition 5. Acute on chronic diastolic CHF (LVEF 43-73% with  grade II DD). Volume overload more likely related to nutrition, decreased mobility, Florinef, and acute illness 6. 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 On vent via trach 7. Multiple pressure ulcers/wounds - wound care.  Patient is refusing at this time 8. Chronic pain - on fentanyl patch and oxycodone. 9. Anemia: Hemoglobin now greater than 7. Will give Aranesp given repeated need for transfusions.  May check iron stores in 1 to 2 weeks after transfusion 10. Disposition - awaiting transfer back to [facility] or [facility] pending bed availability.

## 2020-05-14 ENCOUNTER — Inpatient Hospital Stay (HOSPITAL_COMMUNITY): Payer: Medicare Other

## 2020-05-14 DIAGNOSIS — I5031 Acute diastolic (congestive) heart failure: Secondary | ICD-10-CM

## 2020-05-14 DIAGNOSIS — A498 Other bacterial infections of unspecified site: Secondary | ICD-10-CM | POA: Diagnosis not present

## 2020-05-14 DIAGNOSIS — R7881 Bacteremia: Secondary | ICD-10-CM | POA: Diagnosis not present

## 2020-05-14 DIAGNOSIS — G901 Familial dysautonomia [Riley-Day]: Secondary | ICD-10-CM

## 2020-05-14 DIAGNOSIS — N179 Acute kidney failure, unspecified: Secondary | ICD-10-CM | POA: Diagnosis not present

## 2020-05-14 DIAGNOSIS — A4159 Other Gram-negative sepsis: Secondary | ICD-10-CM | POA: Diagnosis not present

## 2020-05-14 DIAGNOSIS — I5020 Unspecified systolic (congestive) heart failure: Secondary | ICD-10-CM | POA: Diagnosis not present

## 2020-05-14 DIAGNOSIS — I959 Hypotension, unspecified: Secondary | ICD-10-CM | POA: Diagnosis not present

## 2020-05-14 LAB — RENAL FUNCTION PANEL
Albumin: 2 g/dL — ABNORMAL LOW (ref 3.5–5.0)
Anion gap: 9 (ref 5–15)
BUN: 174 mg/dL — ABNORMAL HIGH (ref 6–20)
CO2: 28 mmol/L (ref 22–32)
Calcium: 8.8 mg/dL — ABNORMAL LOW (ref 8.9–10.3)
Chloride: 103 mmol/L (ref 98–111)
Creatinine, Ser: 2.1 mg/dL — ABNORMAL HIGH (ref 0.61–1.24)
GFR, Estimated: 37 mL/min — ABNORMAL LOW (ref >60)
Glucose, Bld: 110 mg/dL — ABNORMAL HIGH (ref 70–99)
Phosphorus: 7.9 mg/dL — ABNORMAL HIGH (ref 2.5–4.6)
Potassium: 4.8 mmol/L (ref 3.5–5.1)
Sodium: 140 mmol/L (ref 135–145)

## 2020-05-14 LAB — HEMOGLOBIN AND HEMATOCRIT, BLOOD
HCT: 22.2 % — ABNORMAL LOW (ref 39.0–52.0)
HCT: 25.5 % — ABNORMAL LOW (ref 39.0–52.0)
Hemoglobin: 6.9 g/dL — CL (ref 13.0–17.0)
Hemoglobin: 7.8 g/dL — ABNORMAL LOW (ref 13.0–17.0)

## 2020-05-14 LAB — ECHOCARDIOGRAM LIMITED
Area-P 1/2: 3.77 cm2
Height: 74 in
Weight: 4017.66 oz

## 2020-05-14 LAB — GLUCOSE, CAPILLARY
Glucose-Capillary: 99 mg/dL (ref 70–99)
Glucose-Capillary: 96 mg/dL (ref 70–99)
Glucose-Capillary: 113 mg/dL — ABNORMAL HIGH (ref 70–99)
Glucose-Capillary: 94 mg/dL (ref 70–99)
Glucose-Capillary: 102 mg/dL — ABNORMAL HIGH (ref 70–99)
Glucose-Capillary: 100 mg/dL — ABNORMAL HIGH (ref 70–99)

## 2020-05-14 LAB — PREPARE RBC (CROSSMATCH)

## 2020-05-14 LAB — MAGNESIUM: Magnesium: 1.8 mg/dL (ref 1.7–2.4)

## 2020-05-14 MED ORDER — METOLAZONE 2.5 MG PO TABS
5.0000 mg | ORAL_TABLET | Freq: Every day | ORAL | Status: AC
  Administered 2020-05-15: 5 mg
  Filled 2020-05-14: qty 2

## 2020-05-14 MED ORDER — SODIUM CHLORIDE 0.9% IV SOLUTION: Freq: Once | INTRAVENOUS | Status: DC

## 2020-05-14 MED ORDER — FUROSEMIDE 10 MG/ML IJ SOLN
120.0000 mg | Freq: Three times a day (TID) | INTRAVENOUS | Status: DC
  Administered 2020-05-15 (×3): 120 mg via INTRAVENOUS
  Filled 2020-05-14: qty 10
  Filled 2020-05-14 (×5): qty 12

## 2020-05-14 NOTE — Progress Notes (Signed)
SLP Note  Patient Details Name: Phillip Klein MRN: [id number] DOB: 02-09-1966   Orders received for swallow assessment "to see if patient is able to have sips and bites of small food per report of him and his family he is able to do this without aspirating." Per Palliative note 3/10, "([name]) shares that at the facility he is able to sip thin liquids through a straw and eat soft foods even without the Passy-Muir valve in and this brings him a great amount of joy."   This clinician reached out to SLP at [facility], who responded:    "We did minimal ice chips after extensive oral care. I saw him for a short while with PMV, and then   he became too sick with Fi02>60% so I had to stop with his PMV. His family was noncompliant despite extensive  education around the high risks of silent aspiration...wife would try and sneak in thins and foods and we had educated  multiple times."  It is unlikely that pt can eat without risks described above.  We could consider a FEES next week if results would be valuable and offer further help with developing GOC.  Will await direction from medical team prior to pursuing a swallowing evaluation. D/W RN and will reach out to Ocie Bob, Palliative Medicine.  Thank you,  Sharaya Boruff L. Samson Frederic, MA CCC/SLP Acute Rehabilitation Services Office number [phone] Pager [phone]   Blenda Mounts Laurice 05/14/2020, 3:50 PM

## 2020-05-14 NOTE — Progress Notes (Signed)
PROGRESS NOTE    Phillip Klein  ZGY:[id number] DOB: October 27, 1965 DOA: 04/30/2020 PCP: [name], MD   Brief Narrative:  This 55 years old male with PMH significant for chronic hypoxic respiratory failure with tracheostomy and vent since 2021 after C-spine surgery, GERD, DM, anemia of chronic disease, quadriplegia, malnutrition, neurogenic orthostatic hypotension, untreated HCV, previous IV drug use, cirrhosis, chronic pain syndrome was sent from [facility] with septic shock.  Patient admitted in the ICU with septic shock secondary to ESBL/ [name] bacteremia.  Patient had Hickman's catheter which was removed,  presumed to be the source of infection.  Patient is getting meropenem.  Patient is also having acute kidney injury which is recovering,  nephrology is following.  Assessment & Plan:   Principal Problem:   Gram-negative bacteremia Active Problems:   Septic shock (HCC)   Pressure injury of skin   Malnutrition of moderate degree   Encounter for feeding tube placement   Hypotension   Quadriplegia (HCC)   Infection due to ESBL-producing [name] pneumoniae   Chronic hepatitis C without hepatic coma (HCC)   Anemia   Jejunostomy tube leak (HCC)   Pleural effusion   Sinus arrest   Pacemaker   HFrEF (heart failure with reduced ejection fraction) (HCC)   Severe malnutrition (HCC)   Anasarca   Acute renal failure superimposed on stage 3b chronic kidney disease (HCC)   Septic shock sec to [name] bacteremia from UTI vs line infection: Patient presented with septic shock at admission. Hickman catheter removed, presumed to be the source of infection. Blood cultures grew [name] bacteremia. UA + Completed meropenem for 10 days from when lines were DC'd, repeat blood cultures negative. Patient remains afebrile, without leukocytosis. Plan was to receive PICC line once antibiotics completed. PICC line inserted 3/8 / Left arm iv line removed. Duplex : No  DVT. Sepsis physiology has resolved. Sepsis resolved.  AKI most likely ATN in setting of septic shock / Anasarca: Nephrology is following. There is no urgent indication for dialysis at this time. Patient has received trials of albumin plus Lasix which was not effective. Nephrology increased Lasix to 120 mg every 8 hours.  Added metolazone 5 mg daily Monitor BMP and urine output. Patient has good urine output 2.3 L/ 24 hr. Serum creatinine and BUN going in opposite direction. Nephrology recommending pause diuretics for today.  CHF Exacerbation (Combined systolic and diastolic): Patient remains with generalized body swelling, scrotal edema Echocardiogram LVEF 40 to 45%.  Global hypokinesis. Pause Lasix 120 mg every 8 hours as per nephro for today Cardiology recommended continue aggressive diuresis as per nephro.  Jejunostomy tube drainage> improved. Continue feeding through J tube. Appears to be at goal.  Hypotension likely related to orthostatic hypotension. Florinef was discontinued in the ICU, probably contributing to water retention. Midodrine has been stopped. BP been improving , now elevated. Started norvasc 5 mg daily  Chronic vent dependent hypoxic respiratory failure: Due to quadriplegia. Continue current ventilatory support with trach.  PCCM helping with managing vent setting.  Multiple pressure ulcers/wounds present on admission: Continue Wound care  Chronic pain syndrome: Continue fentanyl patch. Oxycodone to prn  Depression, insomnia Continue Sertraline, trazodone. Continue clonazepam  GERD  Continue Pantroprazole  Anemia of chronic disease HGB drop to 6.8 from 7.1 Transfuse for HGB < 7, 2 U given Post transfusion Hb , 7.2> 7.2 No evidence of bleeding. Stool occult blood negative. Hb dropped to 6.9 , getting 1 unit PRBC.  Left Arm swelling: Removed peripheral line from the  left arm. Venous duplex negative for DVT.    DVT prophylaxis:  Heparin sq Code Status: Full code. Family Communication:  No family at bed side. Disposition Plan:    Status is: Inpatient  Remains inpatient appropriate because:Inpatient level of care appropriate due to severity of illness   Dispo: The patient is from: SNF              Anticipated d/c is to: LTAC              Patient currently is not medically stable to d/c.   Difficult to place patient No.  Awaiting transfer back to [facility] or [facility] pending bed availability.  Consultants:   PCCM.  Procedures: Hickman removed.  Antimicrobials:  Anti-infectives (From admission, onward)   Start     Dose/Rate Route Frequency Ordered Stop   05/11/20 0800  meropenem (MERREM) 1 g in sodium chloride 0.9 % 100 mL IVPB        1 g 200 mL/hr over 30 Minutes Intravenous Every 8 hours 05/11/20 0707 05/12/20 1555   05/11/20 0715  meropenem (MERREM) 1 g in sodium chloride 0.9 % 100 mL IVPB  Status:  Discontinued        1 g 200 mL/hr over 30 Minutes Intravenous Every 8 hours 05/11/20 0706 05/11/20 0707   05/03/20 2200  meropenem (MERREM) 1 g in sodium chloride 0.9 % 100 mL IVPB  Status:  Discontinued        1 g 200 mL/hr over 30 Minutes Intravenous Every 12 hours 05/03/20 1041 05/11/20 0706   04/30/20 1145  meropenem (MERREM) 2 g in sodium chloride 0.9 % 100 mL IVPB  Status:  Discontinued        2 g 200 mL/hr over 30 Minutes Intravenous Every 12 hours 04/30/20 1052 05/03/20 1041   04/30/20 0530  ceFEPIme (MAXIPIME) 2 g in sodium chloride 0.9 % 100 mL IVPB  Status:  Discontinued        2 g 200 mL/hr over 30 Minutes Intravenous Every 12 hours 04/19/2020 1852 04/30/20 1051   04/15/2020 1852  vancomycin variable dose per unstable renal function (pharmacist dosing)  Status:  Discontinued         Does not apply See admin instructions 04/19/2020 1852 04/30/20 1051   05/03/2020 1630  vancomycin (VANCOREADY) IVPB 2000 mg/400 mL        2,000 mg 200 mL/hr over 120 Minutes Intravenous  Once 04/21/2020 1621 04/13/2020 2008    04/26/2020 1630  ceFEPIme (MAXIPIME) 2 g in sodium chloride 0.9 % 100 mL IVPB        2 g 200 mL/hr over 30 Minutes Intravenous  Once 04/13/2020 1621 04/14/2020 1749      Subjective: Patient was seen and examined at bedside.  Overnight events noted.  Patient still appeared swollen, with significant scrotal swelling.  Patient has tracheostomy tube, on the vent with full mechanical support.   He has been on aggressive diuresis, with good urine output.  Patient reports feeling improved.  Objective: Vitals:   05/14/20 0900 05/14/20 0906 05/14/20 1111 05/14/20 1138  BP: (!) 155/89 (!) 153/88  140/90  Pulse: 82 82  83  Resp: _0 Temp:  98.7 F (37.1 C) 97.9 F (36.6 C) 97.9 F (36.6 C)  TempSrc:  Oral Oral Oral  SpO2: 99%     Weight:      Height:        Intake/Output Summary (Last 24 hours) at 05/14/2020 1150 Last data  filed at 05/14/2020 1130 Gross per 24 hour  Intake 1712 ml  Output 2540 ml  Net -828 ml   Filed Weights   05/12/20 0437 05/13/20 0432 05/14/20 0437  Weight: 121 kg 118.8 kg 113.9 kg    Examination:  General exam: Appears calm and comfortable, chronically ill-appearing.  Not in any distress.   Patient has tracheostomy tube. Significantly swollen, colostomy bag on left side. Respiratory system: Clear to auscultation. Respiratory effort normal. Cardiovascular system: S1 & S2 heard, RRR. No JVD, murmurs, rubs, gallops or clicks. No pedal edema. Gastrointestinal system: Abdomen is nondistended, soft and nontender. No organomegaly or masses felt.   Normal bowel sounds heard. Scrotal swelling ++ Central nervous system: Alert and oriented x 2.  Extremities: 2+ pitting edema.  No cyanosis no clubbing. Skin: No rashes, lesions or ulcers Psychiatry: Not assessed.     Data Reviewed: I have personally reviewed following labs and imaging studies  CBC: Recent Labs  Lab 05/08/20 0611 05/10/20 0100 05/10/20 1241 05/11/20 0425 05/11/20 1230 05/11/20 2048  05/12/20 0432 05/13/20 0425 05/14/20 0429  WBC 6.6 6.4  --  7.7  --   --  7.2  --   --   HGB 7.0* 6.8*   < > 6.7* 10.0* 7.1* 7.2* 7.2* 6.9*  HCT 22.5* 22.3*   < > 22.0* 32.7* 21.0* 23.3* 22.6* 22.2*  MCV 89.6 93.3  --  92.1  --   --  92.1  --   --   PLT 160 147*  --  139*  --   --  156  --   --    < > = values in this interval not displayed.   Basic Metabolic Panel: Recent Labs  Lab 05/08/20 0611 05/09/20 0151 05/10/20 0100 05/11/20 0425 05/11/20 2048 05/12/20 0432 05/13/20 0425 05/14/20 0429  NA 137   < > 137 137 136 136 140 140  K 3.9   < > 4.4 4.4 4.4 4.4 4.4 4.8  CL 100   < > 101 101  --  101 101 103  CO2 22   < > 22 24  --  _0 GLUCOSE 120*   < > 122* 125*  --  119* 128* 110*  BUN 130*   < > 141* 152*  --  158* 164* 174*  CREATININE 2.42*   < > 2.39* 2.30*  --  2.20* 2.17* 2.10*  CALCIUM 8.7*   < > 8.6* 9.0  --  8.7* 8.8* 8.8*  MG 1.8  --  1.8 1.7  --  1.8  --  1.8  PHOS 6.0*   < > 7.4*  7.3* 7.5*  --  7.7* 8.0* 7.9*   < > = values in this interval not displayed.   GFR: Estimated Creatinine Clearance: 55.2 mL/min (A) (by C-G formula based on SCr of 2.1 mg/dL (H)). Liver Function Tests: Recent Labs  Lab 05/08/20 0611 05/09/20 0151 05/10/20 0100 05/11/20 0425 05/12/20 0432 05/13/20 0425 05/14/20 0429  AST 36  --   --  36  --   --   --   ALT 16  --   --  20  --   --   --   ALKPHOS 191*  --   --  217*  --   --   --   BILITOT 1.0  --   --  0.6  --   --   --   PROT 6.1*  --   --  6.3*  --   --   --  ALBUMIN 2.5*   < > 2.1* 2.0* 1.9* 1.8* 2.0*   < > = values in this interval not displayed.   No results for input(s): LIPASE, AMYLASE in the last 168 hours. No results for input(s): AMMONIA in the last 168 hours. Coagulation Profile: Recent Labs  Lab 05/11/20 0637  INR 1.2   Cardiac Enzymes: No results for input(s): CKTOTAL, CKMB, CKMBINDEX, TROPONINI in the last 168 hours. BNP (last 3 results) No results for input(s): PROBNP in the last 8760  hours. HbA1C: No results for input(s): HGBA1C in the last 72 hours. CBG: Recent Labs  Lab 05/13/20 1933 05/13/20 2314 05/14/20 0325 05/14/20 0745 05/14/20 1107  GLUCAP 99 100* 99 96 113*   Lipid Profile: No results for input(s): CHOL, HDL, LDLCALC, TRIG, CHOLHDL, LDLDIRECT in the last 72 hours. Thyroid Function Tests: No results for input(s): TSH, T4TOTAL, FREET4, T3FREE, THYROIDAB in the last 72 hours. Anemia Panel: No results for input(s): VITAMINB12, FOLATE, FERRITIN, TIBC, IRON, RETICCTPCT in the last 72 hours. Sepsis Labs: No results for input(s): PROCALCITON, LATICACIDVEN in the last 168 hours.  Recent Results (from the past 240 hour(s))  Culture, blood (Routine X 2) w Reflex to ID Panel     Status: None   Collection Time: 05/08/20 12:44 PM   Specimen: BLOOD RIGHT HAND  Result Value Ref Range Status   Specimen Description BLOOD RIGHT HAND  Final   Special Requests   Final    BOTTLES DRAWN AEROBIC ONLY Blood Culture results may not be optimal due to an excessive volume of blood received in culture bottles   Culture   Final    NO GROWTH 5 DAYS Performed at [facility], [place] [address] [place], [place] [address]    Report Status 05/13/2020 FINAL  Final  Culture, blood (Routine X 2) w Reflex to ID Panel     Status: None   Collection Time: 05/08/20 12:45 PM   Specimen: BLOOD LEFT HAND  Result Value Ref Range Status   Specimen Description BLOOD LEFT HAND  Final   Special Requests   Final    BOTTLES DRAWN AEROBIC ONLY Blood Culture results may not be optimal due to an excessive volume of blood received in culture bottles   Culture   Final    NO GROWTH 5 DAYS Performed at [facility], [place] [address] [place], [place] [address]    Report Status 05/13/2020 FINAL  Final    Radiology Studies: DG CHEST PORT 1 VIEW  Result Date: 05/12/2020 CLINICAL DATA:  Shortness of breath EXAM: PORTABLE CHEST 1 VIEW COMPARISON:  None. FINDINGS: Tracheostomy device is  present.  Right PICC line tip overlies SVC. Persistent bilateral pleural effusions with bibasilar atelectasis. Left lung aeration is improved. Persistent mild perihilar edema. Similar cardiomediastinal contours. IMPRESSION: Persistent bilateral pleural effusions and bibasilar atelectasis. Persistent but improved probable pulmonary edema. Electronically Signed   By: Macy Mis M.D.   On: 05/12/2020 16:53   DG Shoulder Left  Result Date: 05/12/2020 CLINICAL DATA:  Left shoulder pain and bruising. EXAM: LEFT SHOULDER - 2+ VIEW COMPARISON:  None. FINDINGS: There is no evidence of fracture or dislocation. There is no evidence of arthropathy or other focal bone abnormality. Soft tissues are unremarkable. IMPRESSION: Negative exam. Electronically Signed   By: Inge Rise M.D.   On: 05/12/2020 15:57   Scheduled Meds: . sodium chloride   Intravenous Once  . amLODipine  5 mg Per Tube Daily  . chlorhexidine gluconate (MEDLINE KIT)  15 mL Mouth Rinse BID  . Chlorhexidine Gluconate Cloth  6 each Topical Daily  . clonazePAM  1 mg Per Tube TID  . darbepoetin (ARANESP) injection - NON-DIALYSIS  60 mcg Subcutaneous Q Mon-1800  . feeding supplement (PROSource TF)  90 mL Per Tube TID  . fentaNYL  1 patch Transdermal Q72H   And  . fentaNYL  1 patch Transdermal Q72H  . insulin aspart  0-9 Units Subcutaneous Q4H  . levothyroxine  125 mcg Per Tube Q0600  . mouth rinse  15 mL Mouth Rinse 10 times per day  . [START ON 05/15/2020] metolazone  5 mg Per Tube Daily  . multivitamin with minerals  1 tablet Per Tube Daily  . nutrition supplement (JUVEN)  1 packet Per Tube BID BM  . pantoprazole sodium  40 mg Per Tube Daily  . sertraline  100 mg Per Tube Daily  . sodium chloride flush  10-40 mL Intracatheter Q12H  . traZODone  100 mg Per Tube QHS   Continuous Infusions: . feeding supplement (OSMOLITE 1.5 CAL) 1,000 mL (05/13/20 2017)  . [START ON 05/15/2020] furosemide       LOS: 15 days    Time spent: 35  mins.   Shawna Clamp, MD [facility]   If 7PM-7AM, please contact night-coverage

## 2020-05-14 NOTE — Progress Notes (Signed)
Progress Note  Patient Name: Phillip Klein Date of Encounter: 05/15/2020  [facility] Cardiologist: Sanda Klein, MD   Subjective   Maintains good UOP off diuretics Remains grossly overloaded Family at bedside; patient states he is comfortable   Inpatient Medications    Scheduled Meds: . sodium chloride   Intravenous Once  . carvedilol  3.125 mg Oral BID WC  . chlorhexidine gluconate (MEDLINE KIT)  15 mL Mouth Rinse BID  . Chlorhexidine Gluconate Cloth  6 each Topical Daily  . clonazePAM  1 mg Per Tube TID  . darbepoetin (ARANESP) injection - NON-DIALYSIS  60 mcg Subcutaneous Q Mon-1800  . feeding supplement (PROSource TF)  90 mL Per Tube TID  . fentaNYL  1 patch Transdermal Q72H   And  . fentaNYL  1 patch Transdermal Q72H  . insulin aspart  0-9 Units Subcutaneous Q4H  . levothyroxine  125 mcg Per Tube Q0600  . mouth rinse  15 mL Mouth Rinse 10 times per day  . multivitamin with minerals  1 tablet Per Tube Daily  . nutrition supplement (JUVEN)  1 packet Per Tube BID BM  . pantoprazole sodium  40 mg Per Tube Daily  . sertraline  100 mg Per Tube Daily  . sodium chloride flush  10-40 mL Intracatheter Q12H  . traZODone  100 mg Per Tube QHS   Continuous Infusions: . feeding supplement (OSMOLITE 1.5 CAL) 1,000 mL (05/15/20 0533)  . furosemide Stopped (05/15/20 0957)   PRN Meds: albuterol, docusate, fentaNYL (SUBLIMAZE) injection, hydrALAZINE, oxyCODONE, polyethylene glycol, prochlorperazine, sodium chloride flush   Vital Signs    Vitals:   05/15/20 1100 05/15/20 1121 05/15/20 1200 05/15/20 1227  BP: (!) 163/94  (!) 163/95   Pulse: 91  83   Resp: 20  20   Temp:  98.4 F (36.9 C)    TempSrc:  Oral    SpO2: 99%  100% 100%  Weight:      Height:        Intake/Output Summary (Last 24 hours) at 05/15/2020 1317 Last data filed at 05/15/2020 1200 Gross per 24 hour  Intake 1702.01 ml  Output 2415 ml  Net -712.99 ml   Last 3 Weights 05/15/2020 05/14/2020  05/13/2020  Weight (lbs) 248 lb 7.3 oz 251 lb 1.7 oz 261 lb 14.5 oz  Weight (kg) 112.7 kg 113.9 kg 118.8 kg      Telemetry    NSR - Personally Reviewed  ECG    No new tracing - Personally Reviewed  Physical Exam   GEN: Chronically ill appearing, trach in place  Neck: Difficult to assess JVD Cardiac: RRR, no murmurs, rubs, or gallops.  Respiratory: Rhonchorous throughout GI: Soft, nontender, non-distended  MS: Generalized anasarca Neuro:  Nonfocal  Psych: Normal affect   Labs    High Sensitivity Troponin:   Recent Labs  Lab 04/11/2020 1606 04/12/2020 2200  TROPONINIHS 15 31*      Chemistry Recent Labs  Lab 05/11/20 0425 05/11/20 2048 05/13/20 0425 05/14/20 0429 05/15/20 0327  NA 137   < > 140 140 139  K 4.4   < > 4.4 4.8 4.9  CL 101   < > 101 103 103  CO2 24   < > '27 28 26  ' GLUCOSE 125*   < > 128* 110* 120*  BUN 152*   < > 164* 174* 182*  CREATININE 2.30*   < > 2.17* 2.10* 2.08*  CALCIUM 9.0   < > 8.8* 8.8* 8.9  PROT 6.3*  --   --   --   --  ALBUMIN 2.0*   < > 1.8* 2.0* 2.0*  AST 36  --   --   --   --   ALT 20  --   --   --   --   ALKPHOS 217*  --   --   --   --   BILITOT 0.6  --   --   --   --   GFRNONAA 33*   < > 36* 37* 38*  ANIONGAP 12   < > '12 9 10   ' < > = values in this interval not displayed.     Hematology Recent Labs  Lab 05/10/20 0100 05/10/20 1241 05/11/20 0425 05/11/20 1230 05/12/20 0432 05/13/20 0425 05/14/20 0429 05/14/20 1407 05/15/20 0327  WBC 6.4  --  7.7  --  7.2  --   --   --   --   RBC 2.39*  --  2.39*  --  2.53*  --   --   --   --   HGB 6.8*   < > 6.7*   < > 7.2*   < > 6.9* 7.8* 7.8*  HCT 22.3*   < > 22.0*   < > 23.3*   < > 22.2* 25.5* 24.4*  MCV 93.3  --  92.1  --  92.1  --   --   --   --   MCH 28.5  --  28.0  --  28.5  --   --   --   --   MCHC 30.5  --  30.5  --  30.9  --   --   --   --   RDW 16.6*  --  16.7*  --  16.8*  --   --   --   --   PLT 147*  --  139*  --  156  --   --   --   --    < > = values in this  interval not displayed.    BNPNo results for input(s): BNP, PROBNP in the last 168 hours.   DDimer No results for input(s): DDIMER in the last 168 hours.   Radiology    ECHOCARDIOGRAM LIMITED  Result Date: 05/14/2020    ECHOCARDIOGRAM LIMITED REPORT   Patient Name:   Phillip Klein Date of Exam: 05/14/2020 Medical Rec #:  [id number]        Height:       74.0 in Accession #:    [id number]       Weight:       251.1 lb Date of Birth:  1965-05-13        BSA:          2.394 m Patient Age:    55 years         BP:           153/88 mmHg Patient Gender: M                HR:           83 bpm. Exam Location:  Inpatient Procedure: Limited Echo, Limited Color Doppler and Cardiac Doppler Indications:    CHF-Acute Diastolic X52.17  History:        Patient has prior history of Echocardiogram examinations, most                 recent 04/30/2020. Pacemaker; Risk Factors:Diabetes.  Sonographer:    Mikki Santee RDCS (AE) Referring Phys: [place]  1. Left ventricular ejection  fraction, by estimation, is 55%. The left ventricle has normal function. Left ventricular diastolic parameters were normal.  2. Right ventricular systolic function is normal. The right ventricular size is moderately enlarged. A leadless pacemaker is visualized.  3. Trivial mitral valve regurgitation. FINDINGS  Left Ventricle: Left ventricular ejection fraction, by estimation, is 55%. The left ventricle has normal function. Left ventricular diastolic parameters were normal. Right Ventricle: The right ventricular size is moderately enlarged. Right ventricular systolic function is normal. Mitral Valve: Trivial mitral valve regurgitation. Additional Comments: A leadless pacemaker is visualized.  Diastology LV e' medial:    9.57 cm/s LV E/e' medial:  10.0 LV e' lateral:   14.90 cm/s LV E/e' lateral: 6.4  MITRAL VALVE MV Area (PHT): 3.77 cm MV Decel Time: 201 msec MV E velocity: 95.70 cm/s MV A velocity: 65.60 cm/s MV E/A ratio:   1.46 Cherlynn Kaiser MD Electronically signed by Cherlynn Kaiser MD Signature Date/Time: 05/14/2020/12:56:19 PM    Final     Cardiac Studies   ECHO:04/30/2020 1. Left ventricular ejection fraction, by estimation, is 45 to 50%. The  left ventricle has mildly decreased function. The left ventricle  demonstrates global hypokinesis. Left ventricular diastolic parameters are  consistent with Grade II diastolic  dysfunction (pseudonormalization).  2. Right ventricular systolic function is moderately reduced.  3. The pericardial effusion is anterior to the right ventricle and  circumferential.   ECHO:06/08/2019 INTERPRETATION ---------------------------------------------------------------  NORMAL LEFT VENTRICULAR SYSTOLIC FUNCTION WITH MILD LVH  MILD RV SYSTOLIC DYSFUNCTION (See above)  VALVULAR REGURGITATION: TRIVIAL MR, TRIVIAL TR  NO VALVULAR STENOSIS  INSUFFICIENT TR TO ESTIMATED RVSP  Patient Profile     55 y.o. 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  Assessment & Plan    #Anasarca: Developed in the setting ofaggressive fluid resuscitationfor sepsison background of severe malnutrition and hypoalbuminemia and fludrocortisone therapy, further complicated by acute on chronic renal insufficiency and mildly reduced LVEF. Now slowly improving -Resumed on  diuretics per renal at 19m IV TID with metolazone  #HFrEF: May be mostly due to RV apical pacing related dyssynchrony and loss of normal AV association. Echo in 08/21 with normal LVEF. Repeat TTE on 03/11 with LVEF 55%. -Diuresis per renal as above -No BB/afterload reduction for now given history of dysautonomia -Monitor I/Os and daily weights  #Ac on chr renal insuff: Likely ATN in the setting of septic shock. Creatinine significantly improved but BUN rising (likely creatinine underestimates GFR, protein loading, catabolic state).  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  #Dysautonomia: May need to resume fludrocortisone and/or midodrine once diuresed to "dry weight" and BP drops  #HTN: Would not treat this any more aggressively - should improve gradually w diuresis and resolution of fludrocortisone effect.  #  Quadriplegia      For questions or updates, please contact [place] Please consult [email or web address] for contact info under        Signed, Freada Bergeron, MD  05/15/2020, 1:17 PM

## 2020-05-14 NOTE — Progress Notes (Signed)
Patient ID: Phillip Klein, male   DOB: Jun 10, 1965, 55 y.o.   MRN: [id number] S: Patient states he feels okay. Denies any significant complaints today. UOP good and net negative.  O:BP (!) 153/88   Pulse 82   Temp 98.7 F (37.1 C) (Oral)   Resp 20   Ht '6\' 2"'  (1.88 m)   Wt 113.9 kg   SpO2 99%   BMI 32.24 kg/m   Intake/Output Summary (Last 24 hours) at 05/14/2020 1028 Last data filed at 05/14/2020 0900 Gross per 24 hour  Intake 1486.1 ml  Output 2215 ml  Net -728.9 ml   Intake/Output: I/O last 3 completed shifts: In: 1754.5 [I.V.:30; NG/GT:1440; IV Piggyback:284.5] Out: 3365 [Urine:2865; Stool:500]  Intake/Output this shift:  Total I/O In: 120 [NG/GT:120] Out: 150 [Urine:150] Weight change: -4.9 kg Gen: On vent via trach in NAD CVS: Normal rate Resp: bilateral chest rise Abd: +BS, soft, NT, +PEG Ext: 2+ anasarca  Recent Labs  Lab 05/08/20 0611 05/09/20 0151 05/10/20 0100 05/11/20 0425 05/11/20 2048 05/12/20 0432 05/13/20 0425 05/14/20 0429  NA 137 137 137 137 136 136 140 140  K 3.9 4.1 4.4 4.4 4.4 4.4 4.4 4.8  CL 100 101 101 101  --  101 101 103  CO2 '22 24 22 24  ' --  '25 27 28  ' GLUCOSE 120* 124* 122* 125*  --  119* 128* 110*  BUN 130* 128* 141* 152*  --  158* 164* 174*  CREATININE 2.42* 2.39* 2.39* 2.30*  --  2.20* 2.17* 2.10*  ALBUMIN 2.5* 2.1* 2.1* 2.0*  --  1.9* 1.8* 2.0*  CALCIUM 8.7* 8.6* 8.6* 9.0  --  8.7* 8.8* 8.8*  PHOS 6.0* 6.4* 7.4*  7.3* 7.5*  --  7.7* 8.0* 7.9*  AST 36  --   --  36  --   --   --   --   ALT 16  --   --  20  --   --   --   --    Liver Function Tests: Recent Labs  Lab 05/08/20 0611 05/09/20 0151 05/11/20 0425 05/12/20 0432 05/13/20 0425 05/14/20 0429  AST 36  --  36  --   --   --   ALT 16  --  20  --   --   --   ALKPHOS 191*  --  217*  --   --   --   BILITOT 1.0  --  0.6  --   --   --   PROT 6.1*  --  6.3*  --   --   --   ALBUMIN 2.5*   < > 2.0* 1.9* 1.8* 2.0*   < > = values in this interval not displayed.   No results  for input(s): LIPASE, AMYLASE in the last 168 hours. No results for input(s): AMMONIA in the last 168 hours. CBC: Recent Labs  Lab 05/08/20 0611 05/10/20 0100 05/10/20 1241 05/11/20 0425 05/11/20 1230 05/12/20 0432 05/13/20 0425 05/14/20 0429  WBC 6.6 6.4  --  7.7  --  7.2  --   --   HGB 7.0* 6.8*   < > 6.7*   < > 7.2* 7.2* 6.9*  HCT 22.5* 22.3*   < > 22.0*   < > 23.3* 22.6* 22.2*  MCV 89.6 93.3  --  92.1  --  92.1  --   --   PLT 160 147*  --  139*  --  156  --   --    < > =  values in this interval not displayed.   Cardiac Enzymes: No results for input(s): CKTOTAL, CKMB, CKMBINDEX, TROPONINI in the last 168 hours. CBG: Recent Labs  Lab 05/13/20 1540 05/13/20 1933 05/13/20 2314 05/14/20 0325 05/14/20 0745  GLUCAP 119* 99 100* 99 96    Iron Studies:  No results for input(s): IRON, TIBC, TRANSFERRIN, FERRITIN in the last 72 hours. Studies/Results: DG CHEST PORT 1 VIEW  Result Date: 05/12/2020 CLINICAL DATA:  Shortness of breath EXAM: PORTABLE CHEST 1 VIEW COMPARISON:  None. FINDINGS: Tracheostomy device is present.  Right PICC line tip overlies SVC. Persistent bilateral pleural effusions with bibasilar atelectasis. Left lung aeration is improved. Persistent mild perihilar edema. Similar cardiomediastinal contours. IMPRESSION: Persistent bilateral pleural effusions and bibasilar atelectasis. Persistent but improved probable pulmonary edema. Electronically Signed   By: Macy Mis M.D.   On: 05/12/2020 16:53   DG Shoulder Left  Result Date: 05/12/2020 CLINICAL DATA:  Left shoulder pain and bruising. EXAM: LEFT SHOULDER - 2+ VIEW COMPARISON:  None. FINDINGS: There is no evidence of fracture or dislocation. There is no evidence of arthropathy or other focal bone abnormality. Soft tissues are unremarkable. IMPRESSION: Negative exam. Electronically Signed   By: Inge Rise M.D.   On: 05/12/2020 15:57   . sodium chloride   Intravenous Once  . amLODipine  5 mg Per Tube Daily   . chlorhexidine gluconate (MEDLINE KIT)  15 mL Mouth Rinse BID  . Chlorhexidine Gluconate Cloth  6 each Topical Daily  . clonazePAM  1 mg Per Tube TID  . darbepoetin (ARANESP) injection - NON-DIALYSIS  60 mcg Subcutaneous Q Mon-1800  . feeding supplement (PROSource TF)  90 mL Per Tube TID  . fentaNYL  1 patch Transdermal Q72H   And  . fentaNYL  1 patch Transdermal Q72H  . heparin injection (subcutaneous)  5,000 Units Subcutaneous Q8H  . insulin aspart  0-9 Units Subcutaneous Q4H  . levothyroxine  125 mcg Per Tube Q0600  . mouth rinse  15 mL Mouth Rinse 10 times per day  . [START ON 05/15/2020] metolazone  5 mg Per Tube Daily  . multivitamin with minerals  1 tablet Per Tube Daily  . nutrition supplement (JUVEN)  1 packet Per Tube BID BM  . pantoprazole sodium  40 mg Per Tube Daily  . sertraline  100 mg Per Tube Daily  . sodium chloride flush  10-40 mL Intracatheter Q12H  . traZODone  100 mg Per Tube QHS    BMET    Component Value Date/Time   NA 140 05/14/2020 0429   K 4.8 05/14/2020 0429   CL 103 05/14/2020 0429   CO2 28 05/14/2020 0429   GLUCOSE 110 (H) 05/14/2020 0429   BUN 174 (H) 05/14/2020 0429   CREATININE 2.10 (H) 05/14/2020 0429   CALCIUM 8.8 (L) 05/14/2020 0429   GFRNONAA 37 (L) 05/14/2020 0429   CBC    Component Value Date/Time   WBC 7.2 05/12/2020 0432   RBC 2.53 (L) 05/12/2020 0432   HGB 6.9 (LL) 05/14/2020 0429   HCT 22.2 (L) 05/14/2020 0429   PLT 156 05/12/2020 0432   MCV 92.1 05/12/2020 0432   MCH 28.5 05/12/2020 0432   MCHC 30.9 05/12/2020 0432   RDW 16.8 (H) 05/12/2020 0432   LYMPHSABS 1.6 05/07/2020 0730   MONOABS 0.4 05/07/2020 0730   EOSABS 1.0 (H) 05/07/2020 0730   BASOSABS 0.0 05/07/2020 0730    Assessment/Plan: 1. AKI - likely ischemic ATN in setting of septic shock requiring pressors.  Has chronic hypotension but blood pressure higher recently.  Creatinine is downtrending but BUN remains (likely creatinine underestimates GFR, protein  loading, catabolic state).  No clinical signs of uremia at this time 1. Pause diuretics today then resume Furosemide 120 mg 3 times daily 2. Metolazone 32m again tomorrow 3. Cardiology following, appreciate assistance 4. Will need Nephrology follow up at [facility] once bed available for transfer. 2. Sepsis due to klebsiella bacteremia -significantly improved. No longer on antibiotics 3. Chronic hypotension/HTN: Multifactorial with dysautonomia contributing.  Pressure significantly improved despite holding Florinef and midodrine.  Blood pressure now on the high side started on norvasc 553mdaily. 4. Severe protein malnutrition - tube feeds, nutrition assisting.  Rising BUN likely indicates improved protein nutrition 5. Acute on chronic diastolic CHF (LVEF 4587-57%ith grade II DD). Volume overload more likely related to nutrition, decreased mobility, Florinef, and acute illness 6. 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 On vent via trach 7. Multiple pressure ulcers/wounds - wound care.  Patient is refusing at this time 8. Chronic pain - on fentanyl patch and oxycodone. 9. Anemia: Hemoglobin now greater than 7. S/p Aranesp given repeated need for transfusions.  May check iron stores in 1 to 2 weeks after transfusion 10. Disposition - awaiting transfer back to [facility] pending bed availability.

## 2020-05-14 NOTE — Progress Notes (Signed)
Progress Note  Patient Name: Phillip Klein Date of Encounter: 05/14/2020  [facility] Cardiologist: Sanda Klein, MD   Subjective   No CV complaints. Good UO. -ve fluid balance. Weight declining, although the 10 lb drop documented today is hard to believe. Less HTNive. Anemia, Hgb 6.9, getting 1 u PRBC. Creatinine and BUN continue to move in opposite direction   Inpatient Medications    Scheduled Meds: . sodium chloride   Intravenous Once  . amLODipine  5 mg Per Tube Daily  . chlorhexidine gluconate (MEDLINE KIT)  15 mL Mouth Rinse BID  . Chlorhexidine Gluconate Cloth  6 each Topical Daily  . clonazePAM  1 mg Per Tube TID  . darbepoetin (ARANESP) injection - NON-DIALYSIS  60 mcg Subcutaneous Q Mon-1800  . feeding supplement (PROSource TF)  90 mL Per Tube TID  . fentaNYL  1 patch Transdermal Q72H   And  . fentaNYL  1 patch Transdermal Q72H  . heparin injection (subcutaneous)  5,000 Units Subcutaneous Q8H  . insulin aspart  0-9 Units Subcutaneous Q4H  . levothyroxine  125 mcg Per Tube Q0600  . mouth rinse  15 mL Mouth Rinse 10 times per day  . [START ON 05/15/2020] metolazone  5 mg Per Tube Daily  . multivitamin with minerals  1 tablet Per Tube Daily  . nutrition supplement (JUVEN)  1 packet Per Tube BID BM  . pantoprazole sodium  40 mg Per Tube Daily  . sertraline  100 mg Per Tube Daily  . sodium chloride flush  10-40 mL Intracatheter Q12H  . traZODone  100 mg Per Tube QHS   Continuous Infusions: . feeding supplement (OSMOLITE 1.5 CAL) 1,000 mL (05/13/20 2017)  . [START ON 05/15/2020] furosemide     PRN Meds: albuterol, docusate, fentaNYL (SUBLIMAZE) injection, oxyCODONE, polyethylene glycol, prochlorperazine, sodium chloride flush   Vital Signs    Vitals:   05/14/20 0600 05/14/20 0700 05/14/20 0754 05/14/20 0800  BP: (!) 142/83 139/82  135/80  Pulse: 84 89  82  Resp: _0 Temp:   99.3 F (37.4 C)   TempSrc:   Axillary   SpO2: 100% 99%  94%   Weight:      Height:        Intake/Output Summary (Last 24 hours) at 05/14/2020 0848 Last data filed at 05/14/2020 0631 Gross per 24 hour  Intake 1624.48 ml  Output 2340 ml  Net -715.52 ml   Last 3 Weights 05/14/2020 05/13/2020 05/12/2020  Weight (lbs) 251 lb 1.7 oz 261 lb 14.5 oz 266 lb 12.1 oz  Weight (kg) 113.9 kg 118.8 kg 121 kg      Telemetry    NSR - Personally Reviewed  ECG    No new tracing - Personally Reviewed  Physical Exam  quiet GEN: No acute distress.   Neck: hard to see JVD Cardiac: RRR, no murmurs, rubs, or gallops.  Respiratory: Clear to auscultation bilaterally. GI: Soft, nontender, non-distended  MS: improved generalized edema; No deformity. Neuro:  Nonfocal  Psych: depressed affect   Labs    High Sensitivity Troponin:   Recent Labs  Lab 04/30/2020 1606 04/24/2020 2200  TROPONINIHS 15 31*      Chemistry Recent Labs  Lab 05/08/20 0611 05/09/20 0151 05/11/20 0425 05/11/20 2048 05/12/20 0432 05/13/20 0425 05/14/20 0429  NA 137   < > 137   < > 136 140 140  K 3.9   < > 4.4   < > 4.4 4.4 4.8  CL 100   < > 101  --  101 101 103  CO2 22   < > 24  --  _0 GLUCOSE 120*   < > 125*  --  119* 128* 110*  BUN 130*   < > 152*  --  158* 164* 174*  CREATININE 2.42*   < > 2.30*  --  2.20* 2.17* 2.10*  CALCIUM 8.7*   < > 9.0  --  8.7* 8.8* 8.8*  PROT 6.1*  --  6.3*  --   --   --   --   ALBUMIN 2.5*   < > 2.0*  --  1.9* 1.8* 2.0*  AST 36  --  36  --   --   --   --   ALT 16  --  20  --   --   --   --   ALKPHOS 191*  --  217*  --   --   --   --   BILITOT 1.0  --  0.6  --   --   --   --   GFRNONAA 31*   < > 33*  --  35* 36* 37*  ANIONGAP 15   < > 12  --  _1 < > = values in this interval not displayed.     Hematology Recent Labs  Lab 05/10/20 0100 05/10/20 1241 05/11/20 0425 05/11/20 1230 05/12/20 0432 05/13/20 0425 05/14/20 0429  WBC 6.4  --  7.7  --  7.2  --   --   RBC 2.39*  --  2.39*  --  2.53*  --   --   HGB 6.8*   < > 6.7*   <  > 7.2* 7.2* 6.9*  HCT 22.3*   < > 22.0*   < > 23.3* 22.6* 22.2*  MCV 93.3  --  92.1  --  92.1  --   --   MCH 28.5  --  28.0  --  28.5  --   --   MCHC 30.5  --  30.5  --  30.9  --   --   RDW 16.6*  --  16.7*  --  16.8*  --   --   PLT 147*  --  139*  --  156  --   --    < > = values in this interval not displayed.    BNPNo results for input(s): BNP, PROBNP in the last 168 hours.   DDimer No results for input(s): DDIMER in the last 168 hours.   Radiology    DG CHEST PORT 1 VIEW  Result Date: 05/12/2020 CLINICAL DATA:  Shortness of breath EXAM: PORTABLE CHEST 1 VIEW COMPARISON:  None. FINDINGS: Tracheostomy device is present.  Right PICC line tip overlies SVC. Persistent bilateral pleural effusions with bibasilar atelectasis. Left lung aeration is improved. Persistent mild perihilar edema. Similar cardiomediastinal contours. IMPRESSION: Persistent bilateral pleural effusions and bibasilar atelectasis. Persistent but improved probable pulmonary edema. Electronically Signed   By: Macy Mis M.D.   On: 05/12/2020 16:53   DG Shoulder Left  Result Date: 05/12/2020 CLINICAL DATA:  Left shoulder pain and bruising. EXAM: LEFT SHOULDER - 2+ VIEW COMPARISON:  None. FINDINGS: There is no evidence of fracture or dislocation. There is no evidence of arthropathy or other focal bone abnormality. Soft tissues are unremarkable. IMPRESSION: Negative exam. Electronically Signed   By: Inge Rise M.D.   On: 05/12/2020 15:57  Cardiac Studies   ECHO:04/30/2020 1. Left ventricular ejection fraction, by estimation, is 45 to 50%. The  left ventricle has mildly decreased function. The left ventricle  demonstrates global hypokinesis. Left ventricular diastolic parameters are  consistent with Grade II diastolic  dysfunction (pseudonormalization).  2. Right ventricular systolic function is moderately reduced.  3. The pericardial effusion is anterior to the right ventricle and  circumferential.    ECHO:06/08/2019 INTERPRETATION ---------------------------------------------------------------  NORMAL LEFT VENTRICULAR SYSTOLIC FUNCTION WITH MILD LVH  MILD RV SYSTOLIC DYSFUNCTION (See above)  VALVULAR REGURGITATION: TRIVIAL MR, TRIVIAL TR  NO VALVULAR STENOSIS  INSUFFICIENT TR TO ESTIMATED RVSP   Patient Profile     55 y.o. 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  Assessment & Plan     1. Anasarca:due to aggressive fluid resuscitation for sepsis on background of severe malnutrition and hypoalbuminemia and fludrocortisone therapy, further complicated by acute on chronic renal insufficiency and mildly reduced LVEF. Now hypertensive, diuresing but still above admission weight, starting to make some progress w diuresis. Fludrocortisone effect will gradually dissipate over as much as 2 weeks. 2. HFrEF:may be mostly due to RV apical pacing related dyssynchrony and loss of normal AV association.  Echo in 08/21 with normal LVEF. Awaiting a repeat limited echo. 3. Ac on chr renal insuff:diuretic Rx per Nephrology. BUN remains severely elevated and increasing despite slowly improving creatinine (infused albumin catabolism? blood/excess protein tube feeds in GI tract?), but creat w slight improving trend. 4. Pacemaker: only had intermittent need for pacing due to  severe dysautonomia from cord injury, but his device was programmed at LRL 80.Lower rate limit reduced to 50 and he has not required V pacing since 5. Sepsis:resolved. 6.Dysautonomia:probably will need to stop the amlodipine and he may need to resume fludrocortisone and/or midodrine once diuresed to "dry weight" and BP drops 7. HTN: would not treat this any more aggressively - should improve gradually w diuresis and resolution of fludrocortisone effect. 8. Quadriplegia     For questions or updates, please contact Crystal Please consult [email or web address] for contact info under        Signed, Sanda Klein, MD  05/14/2020, 8:48 AM

## 2020-05-14 NOTE — Progress Notes (Signed)
eLink Physician-Brief Progress Note Patient Name: Phillip Klein DOB: 28-Apr-1965 MRN: [id number]   Date of Service  05/14/2020  HPI/Events of Note  Anemia - Hgb = 6.9.   eICU Interventions  Will transfuse 1 unit PRBC.     Intervention Category Major Interventions: Other:  [name] 05/14/2020, 5:35 AM

## 2020-05-14 NOTE — Progress Notes (Signed)
Date and time results received: 05/14/20 05:26 (use smartphrase ".now" to insert current time)  Test: Hgb Critical Value: 6.9  Name of Provider Notified: Elink (Dr Arsenio Loader)  Orders Received? Or Actions Taken?: Orders Received - See Orders for details

## 2020-05-15 DIAGNOSIS — R7881 Bacteremia: Secondary | ICD-10-CM | POA: Diagnosis not present

## 2020-05-15 DIAGNOSIS — R601 Generalized edema: Secondary | ICD-10-CM | POA: Diagnosis not present

## 2020-05-15 DIAGNOSIS — A4159 Other Gram-negative sepsis: Secondary | ICD-10-CM | POA: Diagnosis not present

## 2020-05-15 DIAGNOSIS — I455 Other specified heart block: Secondary | ICD-10-CM | POA: Diagnosis not present

## 2020-05-15 DIAGNOSIS — I959 Hypotension, unspecified: Secondary | ICD-10-CM | POA: Diagnosis not present

## 2020-05-15 LAB — BPAM RBC
Blood Product Expiration Date: 202204082359
ISSUE DATE / TIME: 202203110847
Unit Type and Rh: 7300

## 2020-05-15 LAB — GLUCOSE, CAPILLARY
Glucose-Capillary: 111 mg/dL — ABNORMAL HIGH (ref 70–99)
Glucose-Capillary: 87 mg/dL (ref 70–99)
Glucose-Capillary: 132 mg/dL — ABNORMAL HIGH (ref 70–99)
Glucose-Capillary: 117 mg/dL — ABNORMAL HIGH (ref 70–99)
Glucose-Capillary: 148 mg/dL — ABNORMAL HIGH (ref 70–99)
Glucose-Capillary: 128 mg/dL — ABNORMAL HIGH (ref 70–99)

## 2020-05-15 LAB — RENAL FUNCTION PANEL
Albumin: 2 g/dL — ABNORMAL LOW (ref 3.5–5.0)
Anion gap: 10 (ref 5–15)
BUN: 182 mg/dL — ABNORMAL HIGH (ref 6–20)
CO2: 26 mmol/L (ref 22–32)
Calcium: 8.9 mg/dL (ref 8.9–10.3)
Chloride: 103 mmol/L (ref 98–111)
Creatinine, Ser: 2.08 mg/dL — ABNORMAL HIGH (ref 0.61–1.24)
GFR, Estimated: 38 mL/min — ABNORMAL LOW (ref >60)
Glucose, Bld: 120 mg/dL — ABNORMAL HIGH (ref 70–99)
Phosphorus: 7.8 mg/dL — ABNORMAL HIGH (ref 2.5–4.6)
Potassium: 4.9 mmol/L (ref 3.5–5.1)
Sodium: 139 mmol/L (ref 135–145)

## 2020-05-15 LAB — TYPE AND SCREEN
ABO/RH(D): B POS
Antibody Screen: NEGATIVE
Unit division: 0

## 2020-05-15 LAB — HEMOGLOBIN AND HEMATOCRIT, BLOOD
HCT: 24.4 % — ABNORMAL LOW (ref 39.0–52.0)
Hemoglobin: 7.8 g/dL — ABNORMAL LOW (ref 13.0–17.0)

## 2020-05-15 MED ORDER — HYDRALAZINE HCL 20 MG/ML IJ SOLN
5.0000 mg | Freq: Four times a day (QID) | INTRAMUSCULAR | Status: DC | PRN
  Administered 2020-05-17: 5 mg via INTRAVENOUS
  Filled 2020-05-15: qty 1

## 2020-05-15 MED ORDER — CARVEDILOL 3.125 MG PO TABS
3.1250 mg | ORAL_TABLET | Freq: Two times a day (BID) | ORAL | Status: DC
  Administered 2020-05-15 – 2020-05-16 (×4): 3.125 mg via ORAL
  Filled 2020-05-15 (×4): qty 1

## 2020-05-15 NOTE — Progress Notes (Signed)
Patient ID: Phillip Klein, male   DOB: 06-29-1965, 55 y.o.   MRN: [id number] S: Urine output fairly good despite holding diuretics.  Creatinine stable.  No significant changes otherwise  O:BP (!) 163/93   Pulse 70   Temp 98 F (36.7 C) (Oral)   Resp 20   Ht '6\' 2"'  (1.88 m)   Wt 112.7 kg   SpO2 99%   BMI 31.90 kg/m   Intake/Output Summary (Last 24 hours) at 05/15/2020 0937 Last data filed at 05/15/2020 0700 Gross per 24 hour  Intake 1848 ml  Output 2040 ml  Net -192 ml   Intake/Output: I/O last 3 completed shifts: In: 2760 [I.V.:140; Blood:318; Other:140; NG/GT:2100; IV Piggyback:62] Out: 7340 [Urine:2925; Stool:500]  Intake/Output this shift:  No intake/output data recorded. Weight change: -1.2 kg Gen: On vent via trach in NAD CVS: Normal rate Resp: bilateral chest rise Abd: +BS, soft, NT, +PEG Ext: 2+ anasarca  Recent Labs  Lab 05/09/20 0151 05/10/20 0100 05/11/20 0425 05/11/20 2048 05/12/20 0432 05/13/20 0425 05/14/20 0429 05/15/20 0327  NA 137 137 137 136 136 140 140 139  K 4.1 4.4 4.4 4.4 4.4 4.4 4.8 4.9  CL 101 101 101  --  101 101 103 103  CO2 '24 22 24  ' --  '25 27 28 26  ' GLUCOSE 124* 122* 125*  --  119* 128* 110* 120*  BUN 128* 141* 152*  --  158* 164* 174* 182*  CREATININE 2.39* 2.39* 2.30*  --  2.20* 2.17* 2.10* 2.08*  ALBUMIN 2.1* 2.1* 2.0*  --  1.9* 1.8* 2.0* 2.0*  CALCIUM 8.6* 8.6* 9.0  --  8.7* 8.8* 8.8* 8.9  PHOS 6.4* 7.4*  7.3* 7.5*  --  7.7* 8.0* 7.9* 7.8*  AST  --   --  36  --   --   --   --   --   ALT  --   --  20  --   --   --   --   --    Liver Function Tests: Recent Labs  Lab 05/11/20 0425 05/12/20 0432 05/13/20 0425 05/14/20 0429 05/15/20 0327  AST 36  --   --   --   --   ALT 20  --   --   --   --   ALKPHOS 217*  --   --   --   --   BILITOT 0.6  --   --   --   --   PROT 6.3*  --   --   --   --   ALBUMIN 2.0*   < > 1.8* 2.0* 2.0*   < > = values in this interval not displayed.   No results for input(s): LIPASE, AMYLASE in the  last 168 hours. No results for input(s): AMMONIA in the last 168 hours. CBC: Recent Labs  Lab 05/10/20 0100 05/10/20 1241 05/11/20 0425 05/11/20 1230 05/12/20 0432 05/13/20 0425 05/14/20 0429 05/14/20 1407 05/15/20 0327  WBC 6.4  --  7.7  --  7.2  --   --   --   --   HGB 6.8*   < > 6.7*   < > 7.2*   < > 6.9* 7.8* 7.8*  HCT 22.3*   < > 22.0*   < > 23.3*   < > 22.2* 25.5* 24.4*  MCV 93.3  --  92.1  --  92.1  --   --   --   --   PLT 147*  --  139*  --  156  --   --   --   --    < > = values in this interval not displayed.   Cardiac Enzymes: No results for input(s): CKTOTAL, CKMB, CKMBINDEX, TROPONINI in the last 168 hours. CBG: Recent Labs  Lab 05/14/20 1515 05/14/20 1922 05/14/20 2315 05/15/20 0315 05/15/20 0726  GLUCAP 94 102* 100* 111* 87    Iron Studies:  No results for input(s): IRON, TIBC, TRANSFERRIN, FERRITIN in the last 72 hours. Studies/Results: ECHOCARDIOGRAM LIMITED  Result Date: 05/14/2020    ECHOCARDIOGRAM LIMITED REPORT   Patient Name:   Phillip Klein Date of Exam: 05/14/2020 Medical Rec #:  [id number]        Height:       74.0 in Accession #:    [id number]       Weight:       251.1 lb Date of Birth:  November 15, 1965        BSA:          2.394 m Patient Age:    34 years         BP:           153/88 mmHg Patient Gender: M                HR:           83 bpm. Exam Location:  Inpatient Procedure: Limited Echo, Limited Color Doppler and Cardiac Doppler Indications:    CHF-Acute Diastolic K18.28  History:        Patient has prior history of Echocardiogram examinations, most                 recent 04/30/2020. Pacemaker; Risk Factors:Diabetes.  Sonographer:    Mikki Santee RDCS (AE) Referring Phys: [place]  1. Left ventricular ejection fraction, by estimation, is 55%. The left ventricle has normal function. Left ventricular diastolic parameters were normal.  2. Right ventricular systolic function is normal. The right ventricular size is moderately  enlarged. A leadless pacemaker is visualized.  3. Trivial mitral valve regurgitation. FINDINGS  Left Ventricle: Left ventricular ejection fraction, by estimation, is 55%. The left ventricle has normal function. Left ventricular diastolic parameters were normal. Right Ventricle: The right ventricular size is moderately enlarged. Right ventricular systolic function is normal. Mitral Valve: Trivial mitral valve regurgitation. Additional Comments: A leadless pacemaker is visualized.  Diastology LV e' medial:    9.57 cm/s LV E/e' medial:  10.0 LV e' lateral:   14.90 cm/s LV E/e' lateral: 6.4  MITRAL VALVE MV Area (PHT): 3.77 cm MV Decel Time: 201 msec MV E velocity: 95.70 cm/s MV A velocity: 65.60 cm/s MV E/A ratio:  1.46 Cherlynn Kaiser MD Electronically signed by Cherlynn Kaiser MD Signature Date/Time: 05/14/2020/12:56:19 PM    Final    . sodium chloride   Intravenous Once  . amLODipine  5 mg Per Tube Daily  . chlorhexidine gluconate (MEDLINE KIT)  15 mL Mouth Rinse BID  . Chlorhexidine Gluconate Cloth  6 each Topical Daily  . clonazePAM  1 mg Per Tube TID  . darbepoetin (ARANESP) injection - NON-DIALYSIS  60 mcg Subcutaneous Q Mon-1800  . feeding supplement (PROSource TF)  90 mL Per Tube TID  . fentaNYL  1 patch Transdermal Q72H   And  . fentaNYL  1 patch Transdermal Q72H  . insulin aspart  0-9 Units Subcutaneous Q4H  . levothyroxine  125 mcg Per Tube Q0600  . mouth rinse  15 mL  Mouth Rinse 10 times per day  . metolazone  5 mg Per Tube Daily  . multivitamin with minerals  1 tablet Per Tube Daily  . nutrition supplement (JUVEN)  1 packet Per Tube BID BM  . pantoprazole sodium  40 mg Per Tube Daily  . sertraline  100 mg Per Tube Daily  . sodium chloride flush  10-40 mL Intracatheter Q12H  . traZODone  100 mg Per Tube QHS    BMET    Component Value Date/Time   NA 139 05/15/2020 0327   K 4.9 05/15/2020 0327   CL 103 05/15/2020 0327   CO2 26 05/15/2020 0327   GLUCOSE 120 (H) 05/15/2020 0327    BUN 182 (H) 05/15/2020 0327   CREATININE 2.08 (H) 05/15/2020 0327   CALCIUM 8.9 05/15/2020 0327   GFRNONAA 38 (L) 05/15/2020 0327   CBC    Component Value Date/Time   WBC 7.2 05/12/2020 0432   RBC 2.53 (L) 05/12/2020 0432   HGB 7.8 (L) 05/15/2020 0327   HCT 24.4 (L) 05/15/2020 0327   PLT 156 05/12/2020 0432   MCV 92.1 05/12/2020 0432   MCH 28.5 05/12/2020 0432   MCHC 30.9 05/12/2020 0432   RDW 16.8 (H) 05/12/2020 0432   LYMPHSABS 1.6 05/07/2020 0730   MONOABS 0.4 05/07/2020 0730   EOSABS 1.0 (H) 05/07/2020 0730   BASOSABS 0.0 05/07/2020 0730    Assessment/Plan: 1. AKI - likely ischemic ATN in setting of septic shock requiring pressors. History of chronic hypotension.  Creatinine significantly improved but BUN rising (likely creatinine underestimates GFR, protein loading, catabolic state).  No clinical signs of uremia at this time despite markedly elevated BUN on labs 1. Resume diuretics today with Lasix 120 mg 3 times daily and metolazone 5 mg 2. Cardiology following, appreciate assistance 3. Will need Nephrology follow up at [facility] or [facility] once bed available for transfer. 4. Continue to monitor for signs of uremia.  I do not feel he is a dialysis candidate due to his comorbidities 2. Sepsis due to klebsiella bacteremia -significantly improved. No longer on antibiotics 3. Chronic hypotension/HTN: Multifactorial with dysautonomia contributing.  Blood pressure now significantly elevated.  Likely will improve with ongoing diuresis.  On Norvasc which may contribute to edema 4. Severe protein malnutrition - tube feeds, nutrition assisting.  Rising BUN likely indicates improved protein nutrition 5. Acute on chronic diastolic CHF (LVEF 29-02% with grade II DD). Volume overload more likely related to nutrition, decreased mobility, Florinef, and acute illness 6. 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 On vent via trach 7. Multiple pressure  ulcers/wounds - wound care.  Patient is refusing at this time 8. Chronic pain - on fentanyl patch and oxycodone. 9. Anemia: Hemoglobin now greater than 7. S/p Aranesp given repeated need for transfusions.  May check iron stores in 1 to 2 weeks after transfusion 10. Disposition - awaiting transfer back to [facility] or [facility] pending bed availability.

## 2020-05-15 NOTE — Progress Notes (Signed)
PROGRESS NOTE    Phillip Klein  YTK:[id number] DOB: [birth year] DOA: 04/11/2020 PCP: [name], MD   Brief Narrative:  This 55 years old male with PMH significant for chronic hypoxic respiratory failure with tracheostomy and vent since 2021 after C-spine surgery, GERD, DM, anemia of chronic disease, quadriplegia, malnutrition, neurogenic orthostatic hypotension, untreated HCV, previous IV drug use, cirrhosis, chronic pain syndrome was sent from [facility] with septic shock.  Patient admitted in the ICU with septic shock secondary to ESBL/ [name] bacteremia.  Patient had Hickman's catheter which was removed,  presumed to be the source of infection.  Patient is getting meropenem.  Patient is also having acute kidney injury which is recovering,  nephrology is following.  Assessment & Plan:   Principal Problem:   Gram-negative bacteremia Active Problems:   Septic shock (HCC)   Pressure injury of skin   Malnutrition of moderate degree   Encounter for feeding tube placement   Hypotension   Quadriplegia (HCC)   Infection due to ESBL-producing [name] pneumoniae   Chronic hepatitis C without hepatic coma (HCC)   Anemia   Jejunostomy tube leak (HCC)   Pleural effusion   Sinus arrest   Pacemaker   HFrEF (heart failure with reduced ejection fraction) (HCC)   Severe malnutrition (HCC)   Anasarca   Acute renal failure superimposed on stage 3b chronic kidney disease (HCC)   Septic shock sec to [name] bacteremia from UTI vs line infection: Patient presented with septic shock at admission. Hickman catheter removed, presumed to be the source of infection. Blood cultures grew [name] bacteremia. UA + Completed meropenem for 10 days from when lines were DC'd, repeat blood cultures negative. Patient remains afebrile, without leukocytosis. Plan was to receive PICC line once antibiotics completed. PICC line inserted 3/8 / Left arm iv line removed. Duplex : No  DVT. Sepsis physiology has resolved. Sepsis resolved.  AKI most likely ATN in setting of septic shock / Anasarca: Nephrology is following. There is no urgent indication for dialysis at this time. Patient has received trials of albumin plus Lasix which was not effective. Nephrology increased Lasix to 120 mg every 8 hours.  Added metolazone 5 mg daily Monitor BMP and urine output. Patient has good urine output 2.3 L/ 24 hr. Serum creatinine and BUN going in opposite direction. Nephrology recommending resume diuretics today  CHF Exacerbation (Combined systolic and diastolic): Patient remains with generalized body swelling, scrotal edema Echocardiogram LVEF 40 to 45%.  Global hypokinesis. Resume Lasix 120 mg every 8 hours as per nephro today Cardiology recommended continue aggressive diuresis as per nephro.  Jejunostomy tube drainage> improved. Continue feeding through J tube. Appears to be at goal.  Hypotension likely related to orthostatic hypotension. Florinef was discontinued in the ICU, probably contributing to water retention. Midodrine has been stopped. BP been improving , now elevated. Continue norvasc 5 mg daily  Chronic vent dependent hypoxic respiratory failure: Due to quadriplegia. Continue current ventilatory support with trach.  PCCM helping with managing vent setting.  Multiple pressure ulcers/wounds present on admission: Continue Wound care  Chronic pain syndrome: Continue fentanyl patch. Oxycodone to prn  Depression, insomnia Continue Sertraline, trazodone. Continue clonazepam  GERD  Continue Pantroprazole  Anemia of chronic disease HGB drop to 6.8 from 7.1 Transfuse for HGB < 7, 2 U given Post transfusion Hb , 7.2> 7.2 No evidence of bleeding. Stool occult blood negative. Hb dropped to 6.9 , post transfusion Hb 7.8  Left Arm swelling: Removed peripheral line from the left arm.  Venous duplex negative for DVT.    DVT prophylaxis: Heparin  sq Code Status: Full code. Family Communication:  No family at bed side. Disposition Plan:    Status is: Inpatient  Remains inpatient appropriate because:Inpatient level of care appropriate due to severity of illness   Dispo: The patient is from: SNF              Anticipated d/c is to: LTAC              Patient currently is not medically stable to d/c.   Difficult to place patient No.  Awaiting transfer back to [facility] or [facility] pending bed availability.  Consultants:   PCCM.  Procedures: Hickman removed.  Antimicrobials:  Anti-infectives (From admission, onward)   Start     Dose/Rate Route Frequency Ordered Stop   05/11/20 0800  meropenem (MERREM) 1 g in sodium chloride 0.9 % 100 mL IVPB        1 g 200 mL/hr over 30 Minutes Intravenous Every 8 hours 05/11/20 0707 05/12/20 1555   05/11/20 0715  meropenem (MERREM) 1 g in sodium chloride 0.9 % 100 mL IVPB  Status:  Discontinued        1 g 200 mL/hr over 30 Minutes Intravenous Every 8 hours 05/11/20 0706 05/11/20 0707   05/03/20 2200  meropenem (MERREM) 1 g in sodium chloride 0.9 % 100 mL IVPB  Status:  Discontinued        1 g 200 mL/hr over 30 Minutes Intravenous Every 12 hours 05/03/20 1041 05/11/20 0706   04/30/20 1145  meropenem (MERREM) 2 g in sodium chloride 0.9 % 100 mL IVPB  Status:  Discontinued        2 g 200 mL/hr over 30 Minutes Intravenous Every 12 hours 04/30/20 1052 05/03/20 1041   04/30/20 0530  ceFEPIme (MAXIPIME) 2 g in sodium chloride 0.9 % 100 mL IVPB  Status:  Discontinued        2 g 200 mL/hr over 30 Minutes Intravenous Every 12 hours 04/28/2020 1852 04/30/20 1051   04/18/2020 1852  vancomycin variable dose per unstable renal function (pharmacist dosing)  Status:  Discontinued         Does not apply See admin instructions 04/24/2020 1852 04/30/20 1051   04/12/2020 1630  vancomycin (VANCOREADY) IVPB 2000 mg/400 mL        2,000 mg 200 mL/hr over 120 Minutes Intravenous  Once 04/10/2020 1621 04/27/2020 2008    04/06/2020 1630  ceFEPIme (MAXIPIME) 2 g in sodium chloride 0.9 % 100 mL IVPB        2 g 200 mL/hr over 30 Minutes Intravenous  Once 04/15/2020 1621 04/10/2020 1749      Subjective: Patient was seen and examined at bedside.  Overnight events noted. Patient reports feeling improved. Patient still appeared swollen, with significant scrotal swelling.   Patient has tracheostomy tube, on the vent with full mechanical support.   He has been on aggressive diuresis, with good urine output.    Objective: Vitals:   05/15/20 1100 05/15/20 1121 05/15/20 1200 05/15/20 1227  BP: (!) 163/94  (!) 163/95   Pulse: 91  83   Resp: 20  20   Temp:  98.4 F (36.9 C)    TempSrc:  Oral    SpO2: 99%  100% 100%  Weight:      Height:        Intake/Output Summary (Last 24 hours) at 05/15/2020 1316 Last data filed at 05/15/2020 1200 Gross per 24  hour  Intake 1702.01 ml  Output 2415 ml  Net -712.99 ml   Filed Weights   05/13/20 0432 05/14/20 0437 05/15/20 0500  Weight: 118.8 kg 113.9 kg 112.7 kg    Examination:  General exam: Appears calm and comfortable, chronically ill-appearing.  Not in any distress.   Patient has tracheostomy tube. Significantly swollen, colostomy bag on left side. Respiratory system: Clear to auscultation. Respiratory effort normal. Cardiovascular system: S1 & S2 heard, RRR. No JVD, murmurs, rubs, gallops or clicks. No pedal edema. Gastrointestinal system: Abdomen is nondistended, soft and nontender. No organomegaly or masses felt.   Normal bowel sounds heard. Scrotal swelling ++ Central nervous system: Alert and oriented x 2.  Extremities: 2+ pitting edema.  No cyanosis no clubbing. Skin: No rashes, lesions or ulcers Psychiatry: Not assessed.     Data Reviewed: I have personally reviewed following labs and imaging studies  CBC: Recent Labs  Lab 05/10/20 0100 05/10/20 1241 05/11/20 0425 05/11/20 1230 05/12/20 0432 05/13/20 0425 05/14/20 0429 05/14/20 1407 05/15/20 0327   WBC 6.4  --  7.7  --  7.2  --   --   --   --   HGB 6.8*   < > 6.7*   < > 7.2* 7.2* 6.9* 7.8* 7.8*  HCT 22.3*   < > 22.0*   < > 23.3* 22.6* 22.2* 25.5* 24.4*  MCV 93.3  --  92.1  --  92.1  --   --   --   --   PLT 147*  --  139*  --  156  --   --   --   --    < > = values in this interval not displayed.   Basic Metabolic Panel: Recent Labs  Lab 05/10/20 0100 05/11/20 0425 05/11/20 2048 05/12/20 0432 05/13/20 0425 05/14/20 0429 05/15/20 0327  NA 137 137 136 136 140 140 139  K 4.4 4.4 4.4 4.4 4.4 4.8 4.9  CL 101 101  --  101 101 103 103  CO2 22 24  --  '25 27 28 26  ' GLUCOSE 122* 125*  --  119* 128* 110* 120*  BUN 141* 152*  --  158* 164* 174* 182*  CREATININE 2.39* 2.30*  --  2.20* 2.17* 2.10* 2.08*  CALCIUM 8.6* 9.0  --  8.7* 8.8* 8.8* 8.9  MG 1.8 1.7  --  1.8  --  1.8  --   PHOS 7.4*  7.3* 7.5*  --  7.7* 8.0* 7.9* 7.8*   GFR: Estimated Creatinine Clearance: 55.5 mL/min (A) (by C-G formula based on SCr of 2.08 mg/dL (H)). Liver Function Tests: Recent Labs  Lab 05/11/20 0425 05/12/20 0432 05/13/20 0425 05/14/20 0429 05/15/20 0327  AST 36  --   --   --   --   ALT 20  --   --   --   --   ALKPHOS 217*  --   --   --   --   BILITOT 0.6  --   --   --   --   PROT 6.3*  --   --   --   --   ALBUMIN 2.0* 1.9* 1.8* 2.0* 2.0*   No results for input(s): LIPASE, AMYLASE in the last 168 hours. No results for input(s): AMMONIA in the last 168 hours. Coagulation Profile: Recent Labs  Lab 05/11/20 0637  INR 1.2   Cardiac Enzymes: No results for input(s): CKTOTAL, CKMB, CKMBINDEX, TROPONINI in the last 168 hours. BNP (last 3 results)  No results for input(s): PROBNP in the last 8760 hours. HbA1C: No results for input(s): HGBA1C in the last 72 hours. CBG: Recent Labs  Lab 05/14/20 1922 05/14/20 2315 05/15/20 0315 05/15/20 0726 05/15/20 1110  GLUCAP 102* 100* 111* 87 132*   Lipid Profile: No results for input(s): CHOL, HDL, LDLCALC, TRIG, CHOLHDL, LDLDIRECT in the last  72 hours. Thyroid Function Tests: No results for input(s): TSH, T4TOTAL, FREET4, T3FREE, THYROIDAB in the last 72 hours. Anemia Panel: No results for input(s): VITAMINB12, FOLATE, FERRITIN, TIBC, IRON, RETICCTPCT in the last 72 hours. Sepsis Labs: No results for input(s): PROCALCITON, LATICACIDVEN in the last 168 hours.  Recent Results (from the past 240 hour(s))  Culture, blood (Routine X 2) w Reflex to ID Panel     Status: None   Collection Time: 05/08/20 12:44 PM   Specimen: BLOOD RIGHT HAND  Result Value Ref Range Status   Specimen Description BLOOD RIGHT HAND  Final   Special Requests   Final    BOTTLES DRAWN AEROBIC ONLY Blood Culture results may not be optimal due to an excessive volume of blood received in culture bottles   Culture   Final    NO GROWTH 5 DAYS Performed at [facility], [place] [address], [place], [place] [address]    Report Status 05/13/2020 FINAL  Final  Culture, blood (Routine X 2) w Reflex to ID Panel     Status: None   Collection Time: 05/08/20 12:45 PM   Specimen: BLOOD LEFT HAND  Result Value Ref Range Status   Specimen Description BLOOD LEFT HAND  Final   Special Requests   Final    BOTTLES DRAWN AEROBIC ONLY Blood Culture results may not be optimal due to an excessive volume of blood received in culture bottles   Culture   Final    NO GROWTH 5 DAYS Performed at [facility], [place] [address], [place], [place] [address]    Report Status 05/13/2020 FINAL  Final    Radiology Studies: ECHOCARDIOGRAM LIMITED  Result Date: 05/14/2020    ECHOCARDIOGRAM LIMITED REPORT   Patient Name:   Phillip Klein Date of Exam: 05/14/2020 Medical Rec #:  [id number]        Height:       74.0 in Accession #:    [id number]       Weight:       251.1 lb Date of Birth:  May 15, 1965        BSA:          2.394 m Patient Age:    69 years         BP:           153/88 mmHg Patient Gender: M                HR:           83 bpm. Exam Location:  Inpatient Procedure: Limited  Echo, Limited Color Doppler and Cardiac Doppler Indications:    CHF-Acute Diastolic O16.07  History:        Patient has prior history of Echocardiogram examinations, most                 recent 04/30/2020. Pacemaker; Risk Factors:Diabetes.  Sonographer:    Mikki Santee RDCS (AE) Referring Phys: [place]  1. Left ventricular ejection fraction, by estimation, is 55%. The left ventricle has normal function. Left ventricular diastolic parameters were normal.  2. Right ventricular systolic function is normal.  The right ventricular size is moderately enlarged. A leadless pacemaker is visualized.  3. Trivial mitral valve regurgitation. FINDINGS  Left Ventricle: Left ventricular ejection fraction, by estimation, is 55%. The left ventricle has normal function. Left ventricular diastolic parameters were normal. Right Ventricle: The right ventricular size is moderately enlarged. Right ventricular systolic function is normal. Mitral Valve: Trivial mitral valve regurgitation. Additional Comments: A leadless pacemaker is visualized.  Diastology LV e' medial:    9.57 cm/s LV E/e' medial:  10.0 LV e' lateral:   14.90 cm/s LV E/e' lateral: 6.4  MITRAL VALVE MV Area (PHT): 3.77 cm MV Decel Time: 201 msec MV E velocity: 95.70 cm/s MV A velocity: 65.60 cm/s MV E/A ratio:  1.46 Cherlynn Kaiser MD Electronically signed by Cherlynn Kaiser MD Signature Date/Time: 05/14/2020/12:56:19 PM    Final    Scheduled Meds: . sodium chloride   Intravenous Once  . carvedilol  3.125 mg Oral BID WC  . chlorhexidine gluconate (MEDLINE KIT)  15 mL Mouth Rinse BID  . Chlorhexidine Gluconate Cloth  6 each Topical Daily  . clonazePAM  1 mg Per Tube TID  . darbepoetin (ARANESP) injection - NON-DIALYSIS  60 mcg Subcutaneous Q Mon-1800  . feeding supplement (PROSource TF)  90 mL Per Tube TID  . fentaNYL  1 patch Transdermal Q72H   And  . fentaNYL  1 patch Transdermal Q72H  . insulin aspart  0-9 Units Subcutaneous Q4H  .  levothyroxine  125 mcg Per Tube Q0600  . mouth rinse  15 mL Mouth Rinse 10 times per day  . multivitamin with minerals  1 tablet Per Tube Daily  . nutrition supplement (JUVEN)  1 packet Per Tube BID BM  . pantoprazole sodium  40 mg Per Tube Daily  . sertraline  100 mg Per Tube Daily  . sodium chloride flush  10-40 mL Intracatheter Q12H  . traZODone  100 mg Per Tube QHS   Continuous Infusions: . feeding supplement (OSMOLITE 1.5 CAL) 1,000 mL (05/15/20 0533)  . furosemide Stopped (05/15/20 0957)     LOS: 16 days    Time spent: 35 mins.   Shawna Clamp, MD [facility]   If 7PM-7AM, please contact night-coverage

## 2020-05-16 DIAGNOSIS — I5020 Unspecified systolic (congestive) heart failure: Secondary | ICD-10-CM | POA: Diagnosis not present

## 2020-05-16 DIAGNOSIS — I959 Hypotension, unspecified: Secondary | ICD-10-CM | POA: Diagnosis not present

## 2020-05-16 DIAGNOSIS — Z7189 Other specified counseling: Secondary | ICD-10-CM

## 2020-05-16 DIAGNOSIS — L89023 Pressure ulcer of left elbow, stage 3: Secondary | ICD-10-CM | POA: Diagnosis not present

## 2020-05-16 DIAGNOSIS — R7881 Bacteremia: Secondary | ICD-10-CM | POA: Diagnosis not present

## 2020-05-16 DIAGNOSIS — R601 Generalized edema: Secondary | ICD-10-CM | POA: Diagnosis not present

## 2020-05-16 DIAGNOSIS — Z515 Encounter for palliative care: Secondary | ICD-10-CM

## 2020-05-16 DIAGNOSIS — L89894 Pressure ulcer of other site, stage 4: Secondary | ICD-10-CM | POA: Diagnosis not present

## 2020-05-16 DIAGNOSIS — L89214 Pressure ulcer of right hip, stage 4: Secondary | ICD-10-CM | POA: Diagnosis not present

## 2020-05-16 DIAGNOSIS — A4159 Other Gram-negative sepsis: Secondary | ICD-10-CM | POA: Diagnosis not present

## 2020-05-16 LAB — RENAL FUNCTION PANEL
Albumin: 1.8 g/dL — ABNORMAL LOW (ref 3.5–5.0)
Anion gap: 10 (ref 5–15)
BUN: 191 mg/dL — ABNORMAL HIGH (ref 6–20)
CO2: 27 mmol/L (ref 22–32)
Calcium: 8.7 mg/dL — ABNORMAL LOW (ref 8.9–10.3)
Chloride: 102 mmol/L (ref 98–111)
Creatinine, Ser: 2.11 mg/dL — ABNORMAL HIGH (ref 0.61–1.24)
GFR, Estimated: 37 mL/min — ABNORMAL LOW (ref >60)
Glucose, Bld: 108 mg/dL — ABNORMAL HIGH (ref 70–99)
Phosphorus: 8.2 mg/dL — ABNORMAL HIGH (ref 2.5–4.6)
Potassium: 5.2 mmol/L — ABNORMAL HIGH (ref 3.5–5.1)
Sodium: 139 mmol/L (ref 135–145)

## 2020-05-16 LAB — HEMOGLOBIN AND HEMATOCRIT, BLOOD
HCT: 23.2 % — ABNORMAL LOW (ref 39.0–52.0)
HCT: 23.4 % — ABNORMAL LOW (ref 39.0–52.0)
Hemoglobin: 7.1 g/dL — ABNORMAL LOW (ref 13.0–17.0)
Hemoglobin: 7.2 g/dL — ABNORMAL LOW (ref 13.0–17.0)

## 2020-05-16 LAB — GLUCOSE, CAPILLARY
Glucose-Capillary: 111 mg/dL — ABNORMAL HIGH (ref 70–99)
Glucose-Capillary: 108 mg/dL — ABNORMAL HIGH (ref 70–99)
Glucose-Capillary: 109 mg/dL — ABNORMAL HIGH (ref 70–99)
Glucose-Capillary: 102 mg/dL — ABNORMAL HIGH (ref 70–99)
Glucose-Capillary: 118 mg/dL — ABNORMAL HIGH (ref 70–99)
Glucose-Capillary: 106 mg/dL — ABNORMAL HIGH (ref 70–99)

## 2020-05-16 LAB — MAGNESIUM: Magnesium: 1.7 mg/dL (ref 1.7–2.4)

## 2020-05-16 LAB — POTASSIUM: Potassium: 5.6 mmol/L — ABNORMAL HIGH (ref 3.5–5.1)

## 2020-05-16 MED ORDER — ACETAMINOPHEN 325 MG PO TABS
650.0000 mg | ORAL_TABLET | Freq: Four times a day (QID) | ORAL | Status: DC | PRN
  Administered 2020-05-20 – 2020-06-04 (×5): 650 mg
  Filled 2020-05-16 (×5): qty 2

## 2020-05-16 MED ORDER — SODIUM ZIRCONIUM CYCLOSILICATE 5 G PO PACK
10.0000 g | PACK | Freq: Once | ORAL | Status: AC
  Administered 2020-05-16: 10 g via ORAL
  Filled 2020-05-16: qty 2

## 2020-05-16 MED ORDER — CARVEDILOL 3.125 MG PO TABS
3.1250 mg | ORAL_TABLET | Freq: Two times a day (BID) | ORAL | Status: DC
  Administered 2020-05-17 – 2020-05-19 (×6): 3.125 mg
  Filled 2020-05-16 (×7): qty 1

## 2020-05-16 MED ORDER — ACETAMINOPHEN 325 MG PO TABS
650.0000 mg | ORAL_TABLET | Freq: Four times a day (QID) | ORAL | Status: DC | PRN
  Administered 2020-05-16: 650 mg via ORAL
  Filled 2020-05-16: qty 2

## 2020-05-16 NOTE — Progress Notes (Signed)
Progress Note  Patient Name: Phillip Klein Date of Encounter: 05/16/2020  [facility] Cardiologist: Sanda Klein, MD   Subjective   Comfortable. Breathing improved. Cr stable at 2.11; BUN still markedly elevated 191 UOP 2750 in 24h; net negative 1074cc  Inpatient Medications    Scheduled Meds: . sodium chloride   Intravenous Once  . carvedilol  3.125 mg Oral BID WC  . chlorhexidine gluconate (MEDLINE KIT)  15 mL Mouth Rinse BID  . Chlorhexidine Gluconate Cloth  6 each Topical Daily  . clonazePAM  1 mg Per Tube TID  . darbepoetin (ARANESP) injection - NON-DIALYSIS  60 mcg Subcutaneous Q Mon-1800  . feeding supplement (PROSource TF)  90 mL Per Tube TID  . fentaNYL  1 patch Transdermal Q72H   And  . fentaNYL  1 patch Transdermal Q72H  . insulin aspart  0-9 Units Subcutaneous Q4H  . levothyroxine  125 mcg Per Tube Q0600  . mouth rinse  15 mL Mouth Rinse 10 times per day  . multivitamin with minerals  1 tablet Per Tube Daily  . nutrition supplement (JUVEN)  1 packet Per Tube BID BM  . pantoprazole sodium  40 mg Per Tube Daily  . sertraline  100 mg Per Tube Daily  . sodium chloride flush  10-40 mL Intracatheter Q12H  . traZODone  100 mg Per Tube QHS   Continuous Infusions: . feeding supplement (OSMOLITE 1.5 CAL) 1,000 mL (05/15/20 2331)   PRN Meds: albuterol, docusate, fentaNYL (SUBLIMAZE) injection, hydrALAZINE, oxyCODONE, polyethylene glycol, prochlorperazine, sodium chloride flush   Vital Signs    Vitals:   05/16/20 0400 05/16/20 0500 05/16/20 0600 05/16/20 0700  BP: 123/70 (!) 148/75 136/78 131/79  Pulse: 74 78 79 78  Resp: '18 18 20 20  ' Temp:      TempSrc:      SpO2: 98% 98% 98% 99%  Weight:  115.7 kg    Height:        Intake/Output Summary (Last 24 hours) at 05/16/2020 0807 Last data filed at 05/16/2020 0700 Gross per 24 hour  Intake 1606.03 ml  Output 2750 ml  Net -1143.97 ml   Last 3 Weights 05/16/2020 05/15/2020 05/14/2020  Weight (lbs) 255 lb  1.2 oz 248 lb 7.3 oz 251 lb 1.7 oz  Weight (kg) 115.7 kg 112.7 kg 113.9 kg      Telemetry    V-paced - Personally Reviewed  ECG    No new tracing- Personally Reviewed  Physical Exam   GEN: Chronically ill appearing, quadriplegic, anasarcic  Neck: JVD difficult to assess Cardiac: RRR, 2/6 systolic murmur  Respiratory: Rhonchorous GI: Soft, nontender, non-distended  MS: Gross anasarca Neuro:  Quadriplegic Psych: Normal affect   Labs    High Sensitivity Troponin:   Recent Labs  Lab 04/21/2020 1606 04/28/2020 2200  TROPONINIHS 15 31*      Chemistry Recent Labs  Lab 05/11/20 0425 05/11/20 2048 05/14/20 0429 05/15/20 0327 05/16/20 0523  NA 137   < > 140 139 139  K 4.4   < > 4.8 4.9 5.2*  CL 101   < > 103 103 102  CO2 24   < > '28 26 27  ' GLUCOSE 125*   < > 110* 120* 108*  BUN 152*   < > 174* 182* 191*  CREATININE 2.30*   < > 2.10* 2.08* 2.11*  CALCIUM 9.0   < > 8.8* 8.9 8.7*  PROT 6.3*  --   --   --   --  ALBUMIN 2.0*   < > 2.0* 2.0* 1.8*  AST 36  --   --   --   --   ALT 20  --   --   --   --   ALKPHOS 217*  --   --   --   --   BILITOT 0.6  --   --   --   --   GFRNONAA 33*   < > 37* 38* 37*  ANIONGAP 12   < > '9 10 10   ' < > = values in this interval not displayed.     Hematology Recent Labs  Lab 05/10/20 0100 05/10/20 1241 05/11/20 0425 05/11/20 1230 05/12/20 0432 05/13/20 0425 05/14/20 1407 05/15/20 0327 05/16/20 0523  WBC 6.4  --  7.7  --  7.2  --   --   --   --   RBC 2.39*  --  2.39*  --  2.53*  --   --   --   --   HGB 6.8*   < > 6.7*   < > 7.2*   < > 7.8* 7.8* 7.1*  HCT 22.3*   < > 22.0*   < > 23.3*   < > 25.5* 24.4* 23.2*  MCV 93.3  --  92.1  --  92.1  --   --   --   --   MCH 28.5  --  28.0  --  28.5  --   --   --   --   MCHC 30.5  --  30.5  --  30.9  --   --   --   --   RDW 16.6*  --  16.7*  --  16.8*  --   --   --   --   PLT 147*  --  139*  --  156  --   --   --   --    < > = values in this interval not displayed.    BNPNo results for  input(s): BNP, PROBNP in the last 168 hours.   DDimer No results for input(s): DDIMER in the last 168 hours.   Radiology    ECHOCARDIOGRAM LIMITED  Result Date: 05/14/2020    ECHOCARDIOGRAM LIMITED REPORT   Patient Name:   Phillip Klein Date of Exam: 05/14/2020 Medical Rec #:  [id number]        Height:       74.0 in Accession #:    [id number]       Weight:       251.1 lb Date of Birth:  [birth year]        BSA:          2.394 m Patient Age:    55 years         BP:           153/88 mmHg Patient Gender: M                HR:           83 bpm. Exam Location:  Inpatient Procedure: Limited Echo, Limited Color Doppler and Cardiac Doppler Indications:    CHF-Acute Diastolic Y61.68  History:        Patient has prior history of Echocardiogram examinations, most                 recent 04/30/2020. Pacemaker; Risk Factors:Diabetes.  Sonographer:    Mikki Santee RDCS (AE) Referring Phys: [place]  1. Left ventricular ejection  fraction, by estimation, is 55%. The left ventricle has normal function. Left ventricular diastolic parameters were normal.  2. Right ventricular systolic function is normal. The right ventricular size is moderately enlarged. A leadless pacemaker is visualized.  3. Trivial mitral valve regurgitation. FINDINGS  Left Ventricle: Left ventricular ejection fraction, by estimation, is 55%. The left ventricle has normal function. Left ventricular diastolic parameters were normal. Right Ventricle: The right ventricular size is moderately enlarged. Right ventricular systolic function is normal. Mitral Valve: Trivial mitral valve regurgitation. Additional Comments: A leadless pacemaker is visualized.  Diastology LV e' medial:    9.57 cm/s LV E/e' medial:  10.0 LV e' lateral:   14.90 cm/s LV E/e' lateral: 6.4  MITRAL VALVE MV Area (PHT): 3.77 cm MV Decel Time: 201 msec MV E velocity: 95.70 cm/s MV A velocity: 65.60 cm/s MV E/A ratio:  1.46 Cherlynn Kaiser MD Electronically signed by  Cherlynn Kaiser MD Signature Date/Time: 05/14/2020/12:56:19 PM    Final     Cardiac Studies   ECHO:04/30/2020 1. Left ventricular ejection fraction, by estimation, is 45 to 50%. The  left ventricle has mildly decreased function. The left ventricle  demonstrates global hypokinesis. Left ventricular diastolic parameters are  consistent with Grade II diastolic  dysfunction (pseudonormalization).  2. Right ventricular systolic function is moderately reduced.  3. The pericardial effusion is anterior to the right ventricle and  circumferential.   ECHO:06/08/2019 INTERPRETATION ---------------------------------------------------------------  NORMAL LEFT VENTRICULAR SYSTOLIC FUNCTION WITH MILD LVH  MILD RV SYSTOLIC DYSFUNCTION (See above)  VALVULAR REGURGITATION: TRIVIAL MR, TRIVIAL TR  NO VALVULAR STENOSIS  INSUFFICIENT TR TO ESTIMATED RVSP   Patient Profile     55 y.o. 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  Assessment & Plan    #Anasarca: Developed in the setting ofaggressive fluid resuscitationfor sepsison background of severe malnutritionandhypoalbuminemia and fludrocortisone therapy, further complicated by acute on chronic renal insufficiency and mildly reduced LVEF. Now slowly improving. -Holding diuretics today per renal -Not HD candidate due  to comorbidities  #HFrEF: May be mostly due to RV apical pacing related dyssynchrony and loss of normal AV association. Echo in 08/21 with normal LVEF. Repeat TTE on 03/11 with LVEF 55%. -Diuresis per renal as above--holding today -No BB/afterload reduction for now given history of dysautonomia -Monitor I/Os and daily weights  #Ac on chr renal insuff: Likely ATN in the setting of septic shock. Creatinine significantly improved but BUN rising(likely creatinine underestimates GFR, protein loading, catabolic state). No clinical signs of uremia at this timedespite markedly elevated BUN on labs. 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  #Dysautonomia: May need to resume fludrocortisone and/or midodrine once diuresed to "dry weight"and BP drops  #HTN: Would not treat this any more aggressively - should improve gradually w diuresis and resolution of fludrocortisone effect.  #Quadriplegia      For questions or updates, please  contact [facility] Please consult [email or web address] for contact info under        Signed, Freada Bergeron, MD  05/16/2020, 8:07 AM

## 2020-05-16 NOTE — Progress Notes (Signed)
Palliative Medicine Inpatient Follow Up Note  HPI: 55 y.o. 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  Admission has been complicated by acute kidney injury which has recovered.  Palliative medicine consulted for goals of care as patient was refusing dressing changes and wound care and other care interventions.  Palliative care was asked to get involved to aid in establishing goals of care.  Today's Discussion (05/16/2020):  *Please note that this is a verbal dictation therefore any spelling or grammatical errors are due to the "[place] [employer]" system interpretation.  Patients bedside RN, Mahala Menghini requested I speak to patients wife. She shares that the patient has incrementally been refusing care, most notably his BID wound changes. Mahala Menghini states that she had a conversation with the patients ex-wife, [name] and introduced the topic of DNR code status and comfort care. Mahala Menghini also shares that [name] is intermittently providing Damonta with food and hydration which she worries is causing him to recurrently aspirate.   I met with [name] at bedside. She summarized Phillip Klein's complicated clinical course over the past seven months inclusive of surgery for abscess of the spine leaving him trach/vent/ J-tube dependent. We reviewed that Phillip Klein has been chronically ill for seven months now. I shared my concerns is the fact that his integumentary system is breaking down. We reviewed together that this is a very poor prognosticator in the long run in addition to the reality that he is completely immobile, anasarcic, and vent dependent. I shared with her that no matter  what is done for him he unfortunately has a very high risk of mortality and clinical decompensation.   [name] seems to place great blame on [facility] and it appears is having a hard time accepting how ill Phillip Klein is. She shares with me that if he could be moved closer to her un [place] it would be better for the both of them.   We reviewed that if he would like to be full code then that means "do everything" and the medical staff need to be able to keep him NPO and change his dressings as ordered. I stated that I am very concerned if Sharad has a cardiac arrest that he will not improve his condition by any measure through performing CPR and may actually hurt him more. [name] heard this though she shares with me that at [facility] they performed chest compressions on Phillip Klein weekly as he was "constantly" going into cardiac arrest.  I told her that this was many months ago and he is in an every worse physical state now then he was then. I strongly recommended against CPR. [name] was not comfortable making this decision without Axel's input though when we tried to have this conversation with him directly he kept drifting off to sleep. I shared that the decision may come down to her.   We reviewed what comfort care is and what that would entail inclusive of medications to control pain, dyspnea, agitation, nausea, itching, and hiccups.  We discussed stopping all uneccessary measures such as ventilatory support, blood draws, cardiac monitoring, needle sticks, and frequent vital signs. [name] said that she did not know transitioning to comfort oriented care would mean stopping the ventilator. I shared with her  that ventilation can be uncomfortable and when we make the decision to purely focus on comfort our goals is to decrease the burdens of any and all uncomfortable measures. [name] again shares that this is [place]'s decision though again, he was unable to participate in this conversation.  We reviewed Tyrice's physical  condition. We discussed what he has been through. I provided her with a MOST form and asked her to review the  "Hard Choices for [employer]" booklet.  I shared with her that it is important that we really consider the whole person when we thing about changing any care measures. She understood and shared that she does not feel in a position to make that decision right now. I told her that my colleague Leonard Downing would be on throughout the week and follow up with her.  Discussed the importance of continued conversation with family and their  medical providers regarding overall plan of care and treatment options, ensuring decisions are within the context of the patients values and GOCs.  Questions and concerns addressed   Objective Assessment: Vital Signs Vitals:   05/16/20 1612 05/16/20 1631  BP: 116/69   Pulse: 82   Resp:    Temp:    SpO2:  100%    Intake/Output Summary (Last 24 hours) at 05/16/2020 1726 Last data filed at 05/16/2020 1600 Gross per 24 hour  Intake 1491.96 ml  Output 2050 ml  Net -558.04 ml   Last Weight  Most recent update: 05/16/2020  6:14 AM   Weight  115.7 kg (255 lb 1.2 oz)           Gen: Caucasian chronically ill appearing M HEENT: moist mucous membranes CV: Regular rate and rhythm  PULM: On ventilation ABD: (+) g-tube EXT: Anasarca Neuro: Opens eyes and quickly drifts to sleep  Prognostication: Provent score of 43%, with patients quadraplegia, decubitus ulcers, and impaired nutrition state he is very high risk of mortality in the oncoming months  SUMMARY OF RECOMMENDATIONS   Full Code/ Full Scope of Treatment  Provided [name] a MOST form and Hard Choices for [employer]  Had a comprehensive discussion of aggressive versus comfort focused care  TOC - OP Palliative support  A copy of advance directives has not yet been obtained  Patients ex-wife asked if Aristidis could be transferred somewhere closer to [facility] on discharge  Ongoing incremental PMT  support  Time Spent: 50 Greater than 50% of the time was spent in counseling and coordination of care ______________________________________________________________________________________ [place] Team Team Cell Phone: [phone] Please utilize secure chat with additional questions, if there is no response within 30 minutes please call the above phone number  Palliative Medicine Team providers are available by phone from 7am to 7pm daily and can be reached through the team cell phone.  Should this patient require assistance outside of these hours, please call the patient's attending physician.

## 2020-05-16 NOTE — Progress Notes (Signed)
Patient ID: Phillip Klein, male   DOB: 09-18-1965, 55 y.o.   MRN: [id number] S: Creatinine stable and BUN continues to rise.  Some hematuria noted.  No significant changes otherwise  O:BP 130/78   Pulse 79   Temp 99.5 F (37.5 C) (Oral)   Resp 18   Ht '6\' 2"'  (1.88 m)   Wt 115.7 kg   SpO2 99%   BMI 32.75 kg/m   Intake/Output Summary (Last 24 hours) at 05/16/2020 0932 Last data filed at 05/16/2020 0900 Gross per 24 hour  Intake 1683.55 ml  Output 2750 ml  Net -1066.45 ml   Intake/Output: I/O last 3 completed shifts: In: 2606 [I.V.:240; Other:140; NG/GT:2040; IV Piggyback:186] Out: 7035 [Urine:3465; Stool:500]  Intake/Output this shift:  Total I/O In: 140 [I.V.:20; NG/GT:120] Out: -  Weight change: 3 kg Gen: On vent via trach in NAD CVS: Normal rate Resp: bilateral chest rise Abd: +BS, soft, NT, +PEG Ext: 2+ anasarca  Recent Labs  Lab 05/10/20 0100 05/11/20 0425 05/11/20 2048 05/12/20 0432 05/13/20 0425 05/14/20 0429 05/15/20 0327 05/16/20 0523  NA 137 137 136 136 140 140 139 139  K 4.4 4.4 4.4 4.4 4.4 4.8 4.9 5.2*  CL 101 101  --  101 101 103 103 102  CO2 22 24  --  '25 27 28 26 27  ' GLUCOSE 122* 125*  --  119* 128* 110* 120* 108*  BUN 141* 152*  --  158* 164* 174* 182* 191*  CREATININE 2.39* 2.30*  --  2.20* 2.17* 2.10* 2.08* 2.11*  ALBUMIN 2.1* 2.0*  --  1.9* 1.8* 2.0* 2.0* 1.8*  CALCIUM 8.6* 9.0  --  8.7* 8.8* 8.8* 8.9 8.7*  PHOS 7.4*  7.3* 7.5*  --  7.7* 8.0* 7.9* 7.8* 8.2*  AST  --  36  --   --   --   --   --   --   ALT  --  20  --   --   --   --   --   --    Liver Function Tests: Recent Labs  Lab 05/11/20 0425 05/12/20 0432 05/14/20 0429 05/15/20 0327 05/16/20 0523  AST 36  --   --   --   --   ALT 20  --   --   --   --   ALKPHOS 217*  --   --   --   --   BILITOT 0.6  --   --   --   --   PROT 6.3*  --   --   --   --   ALBUMIN 2.0*   < > 2.0* 2.0* 1.8*   < > = values in this interval not displayed.   No results for input(s): LIPASE, AMYLASE in  the last 168 hours. No results for input(s): AMMONIA in the last 168 hours. CBC: Recent Labs  Lab 05/10/20 0100 05/10/20 1241 05/11/20 0425 05/11/20 1230 05/12/20 0432 05/13/20 0425 05/14/20 1407 05/15/20 0327 05/16/20 0523  WBC 6.4  --  7.7  --  7.2  --   --   --   --   HGB 6.8*   < > 6.7*   < > 7.2*   < > 7.8* 7.8* 7.1*  HCT 22.3*   < > 22.0*   < > 23.3*   < > 25.5* 24.4* 23.2*  MCV 93.3  --  92.1  --  92.1  --   --   --   --   PLT  147*  --  139*  --  156  --   --   --   --    < > = values in this interval not displayed.   Cardiac Enzymes: No results for input(s): CKTOTAL, CKMB, CKMBINDEX, TROPONINI in the last 168 hours. CBG: Recent Labs  Lab 05/15/20 1505 05/15/20 1929 05/15/20 2322 05/16/20 0324 05/16/20 0808  GLUCAP 117* 148* 128* 111* 108*    Iron Studies:  No results for input(s): IRON, TIBC, TRANSFERRIN, FERRITIN in the last 72 hours. Studies/Results: ECHOCARDIOGRAM LIMITED  Result Date: 05/14/2020    ECHOCARDIOGRAM LIMITED REPORT   Patient Name:   Phillip Klein Date of Exam: 05/14/2020 Medical Rec #:  [id number]        Height:       74.0 in Accession #:    [id number]       Weight:       251.1 lb Date of Birth:  [birth year]        BSA:          2.394 m Patient Age:    6 years         BP:           153/88 mmHg Patient Gender: M                HR:           83 bpm. Exam Location:  Inpatient Procedure: Limited Echo, Limited Color Doppler and Cardiac Doppler Indications:    CHF-Acute Diastolic X79.39  History:        Patient has prior history of Echocardiogram examinations, most                 recent 04/30/2020. Pacemaker; Risk Factors:Diabetes.  Sonographer:    Mikki Santee RDCS (AE) Referring Phys: [place]  1. Left ventricular ejection fraction, by estimation, is 55%. The left ventricle has normal function. Left ventricular diastolic parameters were normal.  2. Right ventricular systolic function is normal. The right ventricular size is  moderately enlarged. A leadless pacemaker is visualized.  3. Trivial mitral valve regurgitation. FINDINGS  Left Ventricle: Left ventricular ejection fraction, by estimation, is 55%. The left ventricle has normal function. Left ventricular diastolic parameters were normal. Right Ventricle: The right ventricular size is moderately enlarged. Right ventricular systolic function is normal. Mitral Valve: Trivial mitral valve regurgitation. Additional Comments: A leadless pacemaker is visualized.  Diastology LV e' medial:    9.57 cm/s LV E/e' medial:  10.0 LV e' lateral:   14.90 cm/s LV E/e' lateral: 6.4  MITRAL VALVE MV Area (PHT): 3.77 cm MV Decel Time: 201 msec MV E velocity: 95.70 cm/s MV A velocity: 65.60 cm/s MV E/A ratio:  1.46 Cherlynn Kaiser MD Electronically signed by Cherlynn Kaiser MD Signature Date/Time: 05/14/2020/12:56:19 PM    Final    . sodium chloride   Intravenous Once  . carvedilol  3.125 mg Oral BID WC  . chlorhexidine gluconate (MEDLINE KIT)  15 mL Mouth Rinse BID  . Chlorhexidine Gluconate Cloth  6 each Topical Daily  . clonazePAM  1 mg Per Tube TID  . darbepoetin (ARANESP) injection - NON-DIALYSIS  60 mcg Subcutaneous Q Mon-1800  . feeding supplement (PROSource TF)  90 mL Per Tube TID  . fentaNYL  1 patch Transdermal Q72H   And  . fentaNYL  1 patch Transdermal Q72H  . insulin aspart  0-9 Units Subcutaneous Q4H  . levothyroxine  125 mcg Per Tube Q0600  . mouth  rinse  15 mL Mouth Rinse 10 times per day  . multivitamin with minerals  1 tablet Per Tube Daily  . nutrition supplement (JUVEN)  1 packet Per Tube BID BM  . pantoprazole sodium  40 mg Per Tube Daily  . sertraline  100 mg Per Tube Daily  . sodium chloride flush  10-40 mL Intracatheter Q12H  . traZODone  100 mg Per Tube QHS    BMET    Component Value Date/Time   NA 139 05/16/2020 0523   K 5.2 (H) 05/16/2020 0523   CL 102 05/16/2020 0523   CO2 27 05/16/2020 0523   GLUCOSE 108 (H) 05/16/2020 0523   BUN 191 (H)  05/16/2020 0523   CREATININE 2.11 (H) 05/16/2020 0523   CALCIUM 8.7 (L) 05/16/2020 0523   GFRNONAA 37 (L) 05/16/2020 0523   CBC    Component Value Date/Time   WBC 7.2 05/12/2020 0432   RBC 2.53 (L) 05/12/2020 0432   HGB 7.1 (L) 05/16/2020 0523   HCT 23.2 (L) 05/16/2020 0523   PLT 156 05/12/2020 0432   MCV 92.1 05/12/2020 0432   MCH 28.5 05/12/2020 0432   MCHC 30.9 05/12/2020 0432   RDW 16.8 (H) 05/12/2020 0432   LYMPHSABS 1.6 05/07/2020 0730   MONOABS 0.4 05/07/2020 0730   EOSABS 1.0 (H) 05/07/2020 0730   BASOSABS 0.0 05/07/2020 0730    Assessment/Plan: 1. AKI - likely ischemic ATN in setting of septic shock requiring pressors. History of chronic hypotension.  Creatinine overall significantly improved but BUN rising (likely creatinine underestimates GFR, protein loading, catabolic state).  No clinical signs of uremia at this time despite markedly elevated BUN on labs.  1. Hold diuretics again today; was on lasix 162m IV TID 2. Will need Nephrology follow up at [facility] once bed available for transfer. 3. Continue to monitor for signs of uremia.  I do not feel he is a dialysis candidate due to his comorbidities 2. Sepsis due to klebsiella bacteremia -significantly improved. No longer on antibiotics 3. Chronic hypotension/HTN: Multifactorial with dysautonomia contributing.  Blood pressure now significantly elevated but improved today.  Likely will improve with ongoing diuresis.  On Norvasc which may contribute to edema 4. Severe protein malnutrition - tube feeds, nutrition assisting.  Rising BUN likely indicates improved protein nutrition 5. Acute on chronic diastolic CHF (LVEF 424-19%with grade II DD). Volume overload more likely related to nutrition, decreased mobility, Florinef, and acute/chronic illness 6. 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 On vent via trach 7. Multiple pressure ulcers/wounds - wound care.  Patient is refusing  at this time 8. Chronic pain - on fentanyl patch and oxycodone. 9. Anemia: Hemoglobin ~7. S/p Aranesp given repeated need for transfusions.  May check iron stores in 1 to 2 weeks after transfusion 10. Hematuria: mild but CTM. May need urology eval 11. Disposition - awaiting transfer back to [facility] pending bed availability.

## 2020-05-16 NOTE — Progress Notes (Signed)
Patient refused wound care. This RN explained the importance of changing his dressings twice a day. Patient acknowledged understanding but continued to refuse. Christene Lye, RN was at bedside and witnessed the refusal.   Also, patient refused most mouth care and turns throughout the night. Again, this RN explained the reasoning. Patient acknowledged understanding and continued to refuse consistently.  Will continue to monitor.

## 2020-05-16 NOTE — Progress Notes (Addendum)
HOSPITAL MEDICINE OVERNIGHT EVENT NOTE    Notified by nursing that patient was hyperkalemic this morning at 5.2 with repeat potassium this afternoon being 5.6.  We will obtain stat EKG to ensure there are no EKG changes and if not we will treat with Lokelma monotherapy.  Marinda Elk  MD [facility]   ADDENDUM (3/13 10PM)   ECG shows no hyperkalemia related change.  10 grams of Lokelma ordered.   Deno Lunger Delford Wingert

## 2020-05-16 NOTE — Progress Notes (Signed)
PROGRESS NOTE    Phillip Klein  RPR:[id number] DOB: 09/12/1965 DOA: 05/02/2020 PCP: [name], MD   Brief Narrative:  This 55 years old male with PMH significant for chronic hypoxic respiratory failure with tracheostomy and vent since 2021 after C-spine surgery, GERD, DM, anemia of chronic disease, quadriplegia, malnutrition, neurogenic orthostatic hypotension, untreated HCV, previous IV drug use, cirrhosis, chronic pain syndrome was sent from [facility] with septic shock.  Patient admitted in the ICU with septic shock secondary to ESBL/ [name] bacteremia.  Patient had Hickman's catheter which was removed,  presumed to be the source of infection.  Patient is getting meropenem.  Patient is also having acute kidney injury which is recovering,  nephrology is following.  Assessment & Plan:   Principal Problem:   Gram-negative bacteremia Active Problems:   Septic shock (HCC)   Pressure injury of skin   Malnutrition of moderate degree   Encounter for feeding tube placement   Hypotension   Quadriplegia (HCC)   Infection due to ESBL-producing [name] pneumoniae   Chronic hepatitis C without hepatic coma (HCC)   Anemia   Jejunostomy tube leak (HCC)   Pleural effusion   Sinus arrest   Pacemaker   HFrEF (heart failure with reduced ejection fraction) (HCC)   Severe malnutrition (HCC)   Anasarca   Acute renal failure superimposed on stage 3b chronic kidney disease (HCC)   Septic shock sec to [name] bacteremia from UTI vs line infection: Patient presented with septic shock at admission. Hickman catheter removed, presumed to be the source of infection. Blood cultures grew [name] bacteremia. UA + Completed meropenem for 10 days from when lines were DC'd, repeat blood cultures negative. Patient remains afebrile, without leukocytosis. Plan was to receive PICC line once antibiotics completed. PICC line inserted 3/8 / Left arm iv line removed. Duplex : No  DVT. Sepsis physiology has resolved. Sepsis resolved.  AKI most likely ATN in setting of septic shock / Anasarca: Nephrology is following. There is no urgent indication for dialysis at this time. Patient has received trials of albumin plus Lasix which was not effective. Nephrology increased Lasix to 120 mg every 8 hours.  Added metolazone 5 mg daily Monitor BMP and urine output. Patient has good urine output 2.75 L/ 24 hr. Serum creatinine and BUN going in opposite direction. Nephrology recommending hold diuretics again today.  CHF Exacerbation (Combined systolic and diastolic): Patient remains with generalized body swelling, scrotal edema Echocardiogram LVEF 40 to 45%.  Global hypokinesis. Hold Lasix 120 mg every 8 hours as per nephro today Cardiology recommended continue aggressive diuresis as per nephro.  Jejunostomy tube drainage> improved. Continue feeding through J tube. Appears to be at goal.  Hypotension likely related to orthostatic hypotension. Florinef was discontinued in the ICU, probably contributing to water retention. Midodrine has been stopped. BP been improving , now elevated. Started norvasc 5 mg daily yesterday  Chronic vent dependent hypoxic respiratory failure: Due to quadriplegia. Continue current ventilatory support with trach.  PCCM helping with managing vent setting.  Multiple pressure ulcers/wounds present on admission: Continue Wound care  Chronic pain syndrome: Continue fentanyl patch. Oxycodone to prn  Depression, insomnia Continue Sertraline, trazodone. Continue clonazepam  GERD  Continue Pantroprazole  Anemia of chronic disease HGB drop to 6.8 from 7.1 Transfuse for HGB < 7, 2 U given Post transfusion Hb , 7.2> 7.2 No evidence of bleeding. Stool occult blood negative. Hb dropped to 6.9 , post transfusion Hb 7.8  Left Arm swelling: Removed peripheral line from the  left arm. Venous duplex negative for DVT.  Hematuria:   Patient has pinkish urine in the Foley catheter. Urology consulted, no need for any acute intervention. Recheck UA and renal ultrasound if continues to have hematuria.  DVT prophylaxis: Heparin sq Code Status: Full code. Family Communication:  No family at bed side. Disposition Plan:    Status is: Inpatient  Remains inpatient appropriate because:Inpatient level of care appropriate due to severity of illness   Dispo: The patient is from: SNF              Anticipated d/c is to: LTAC              Patient currently is not medically stable to d/c.   Difficult to place patient No.  Awaiting transfer back to [facility] or [facility] pending bed availability.  Consultants:   PCCM.  Procedures: Hickman removed.  Antimicrobials:  Anti-infectives (From admission, onward)   Start     Dose/Rate Route Frequency Ordered Stop   05/11/20 0800  meropenem (MERREM) 1 g in sodium chloride 0.9 % 100 mL IVPB        1 g 200 mL/hr over 30 Minutes Intravenous Every 8 hours 05/11/20 0707 05/12/20 1555   05/11/20 0715  meropenem (MERREM) 1 g in sodium chloride 0.9 % 100 mL IVPB  Status:  Discontinued        1 g 200 mL/hr over 30 Minutes Intravenous Every 8 hours 05/11/20 0706 05/11/20 0707   05/03/20 2200  meropenem (MERREM) 1 g in sodium chloride 0.9 % 100 mL IVPB  Status:  Discontinued        1 g 200 mL/hr over 30 Minutes Intravenous Every 12 hours 05/03/20 1041 05/11/20 0706   04/30/20 1145  meropenem (MERREM) 2 g in sodium chloride 0.9 % 100 mL IVPB  Status:  Discontinued        2 g 200 mL/hr over 30 Minutes Intravenous Every 12 hours 04/30/20 1052 05/03/20 1041   04/30/20 0530  ceFEPIme (MAXIPIME) 2 g in sodium chloride 0.9 % 100 mL IVPB  Status:  Discontinued        2 g 200 mL/hr over 30 Minutes Intravenous Every 12 hours 04/25/2020 1852 04/30/20 1051   04/16/2020 1852  vancomycin variable dose per unstable renal function (pharmacist dosing)  Status:  Discontinued         Does not apply See admin  instructions 04/13/2020 1852 04/30/20 1051   05/02/2020 1630  vancomycin (VANCOREADY) IVPB 2000 mg/400 mL        2,000 mg 200 mL/hr over 120 Minutes Intravenous  Once 04/11/2020 1621 04/13/2020 2008   04/22/2020 1630  ceFEPIme (MAXIPIME) 2 g in sodium chloride 0.9 % 100 mL IVPB        2 g 200 mL/hr over 30 Minutes Intravenous  Once 04/18/2020 1621 04/09/2020 1749      Subjective: Patient was seen and examined at bedside.  Overnight events noted. Patient reports feeling better Patient still appeared swollen, with significant scrotal swelling.   Patient has tracheostomy tube, on the vent with full mechanical support.   He has been on aggressive diuresis, with good urine output.   He denies any burning urination.  Objective: Vitals:   05/16/20 0800 05/16/20 0838 05/16/20 0900 05/16/20 1227  BP: 128/75 128/75 130/78   Pulse: 78 87 79   Resp: 20  18   Temp:  99.5 F (37.5 C)    TempSrc:  Oral    SpO2: 99% 99% 99% 98%  Weight:      Height:        Intake/Output Summary (Last 24 hours) at 05/16/2020 1253 Last data filed at 05/16/2020 0900 Gross per 24 hour  Intake 1414.02 ml  Output 2050 ml  Net -635.98 ml   Filed Weights   05/14/20 0437 05/15/20 0500 05/16/20 0500  Weight: 113.9 kg 112.7 kg 115.7 kg    Examination:  General exam: Appears calm and comfortable, chronically ill-appearing.  Not in any distress.   Patient has tracheostomy tube. Significantly swollen, colostomy bag on left side. Respiratory system: Clear to auscultation. Respiratory effort normal. Cardiovascular system: S1 & S2 heard, RRR. No JVD, murmurs, rubs, gallops or clicks. No pedal edema. Gastrointestinal system: Abdomen is nondistended, soft and nontender. No organomegaly or masses felt.   Normal bowel sounds heard. Scrotal swelling ++ Central nervous system: Alert and oriented x 2.  Extremities: 2+ pitting edema.  No cyanosis no clubbing. Skin: No rashes, lesions or ulcers Psychiatry: Not assessed.     Data  Reviewed: I have personally reviewed following labs and imaging studies  CBC: Recent Labs  Lab 05/10/20 0100 05/10/20 1241 05/11/20 0425 05/11/20 1230 05/12/20 0432 05/13/20 0425 05/14/20 0429 05/14/20 1407 05/15/20 0327 05/16/20 0523  WBC 6.4  --  7.7  --  7.2  --   --   --   --   --   HGB 6.8*   < > 6.7*   < > 7.2* 7.2* 6.9* 7.8* 7.8* 7.1*  HCT 22.3*   < > 22.0*   < > 23.3* 22.6* 22.2* 25.5* 24.4* 23.2*  MCV 93.3  --  92.1  --  92.1  --   --   --   --   --   PLT 147*  --  139*  --  156  --   --   --   --   --    < > = values in this interval not displayed.   Basic Metabolic Panel: Recent Labs  Lab 05/10/20 0100 05/11/20 0425 05/11/20 2048 05/12/20 0432 05/13/20 0425 05/14/20 0429 05/15/20 0327 05/16/20 0523  NA 137 137   < > 136 140 140 139 139  K 4.4 4.4   < > 4.4 4.4 4.8 4.9 5.2*  CL 101 101  --  101 101 103 103 102  CO2 22 24  --  _0 GLUCOSE 122* 125*  --  119* 128* 110* 120* 108*  BUN 141* 152*  --  158* 164* 174* 182* 191*  CREATININE 2.39* 2.30*  --  2.20* 2.17* 2.10* 2.08* 2.11*  CALCIUM 8.6* 9.0  --  8.7* 8.8* 8.8* 8.9 8.7*  MG 1.8 1.7  --  1.8  --  1.8  --  1.7  PHOS 7.4*  7.3* 7.5*  --  7.7* 8.0* 7.9* 7.8* 8.2*   < > = values in this interval not displayed.   GFR: Estimated Creatinine Clearance: 55.4 mL/min (A) (by C-G formula based on SCr of 2.11 mg/dL (H)). Liver Function Tests: Recent Labs  Lab 05/11/20 0425 05/12/20 0432 05/13/20 0425 05/14/20 0429 05/15/20 0327 05/16/20 0523  AST 36  --   --   --   --   --   ALT 20  --   --   --   --   --   ALKPHOS 217*  --   --   --   --   --   BILITOT 0.6  --   --   --   --   --  PROT 6.3*  --   --   --   --   --   ALBUMIN 2.0* 1.9* 1.8* 2.0* 2.0* 1.8*   No results for input(s): LIPASE, AMYLASE in the last 168 hours. No results for input(s): AMMONIA in the last 168 hours. Coagulation Profile: Recent Labs  Lab 05/11/20 0637  INR 1.2   Cardiac Enzymes: No results for input(s):  CKTOTAL, CKMB, CKMBINDEX, TROPONINI in the last 168 hours. BNP (last 3 results) No results for input(s): PROBNP in the last 8760 hours. HbA1C: No results for input(s): HGBA1C in the last 72 hours. CBG: Recent Labs  Lab 05/15/20 1929 05/15/20 2322 05/16/20 0324 05/16/20 0808 05/16/20 1243  GLUCAP 148* 128* 111* 108* 109*   Lipid Profile: No results for input(s): CHOL, HDL, LDLCALC, TRIG, CHOLHDL, LDLDIRECT in the last 72 hours. Thyroid Function Tests: No results for input(s): TSH, T4TOTAL, FREET4, T3FREE, THYROIDAB in the last 72 hours. Anemia Panel: No results for input(s): VITAMINB12, FOLATE, FERRITIN, TIBC, IRON, RETICCTPCT in the last 72 hours. Sepsis Labs: No results for input(s): PROCALCITON, LATICACIDVEN in the last 168 hours.  Recent Results (from the past 240 hour(s))  Culture, blood (Routine X 2) w Reflex to ID Panel     Status: None   Collection Time: 05/08/20 12:44 PM   Specimen: BLOOD RIGHT HAND  Result Value Ref Range Status   Specimen Description BLOOD RIGHT HAND  Final   Special Requests   Final    BOTTLES DRAWN AEROBIC ONLY Blood Culture results may not be optimal due to an excessive volume of blood received in culture bottles   Culture   Final    NO GROWTH 5 DAYS Performed at [facility], [place] [address] [place], [place] [address]    Report Status 05/13/2020 FINAL  Final  Culture, blood (Routine X 2) w Reflex to ID Panel     Status: None   Collection Time: 05/08/20 12:45 PM   Specimen: BLOOD LEFT HAND  Result Value Ref Range Status   Specimen Description BLOOD LEFT HAND  Final   Special Requests   Final    BOTTLES DRAWN AEROBIC ONLY Blood Culture results may not be optimal due to an excessive volume of blood received in culture bottles   Culture   Final    NO GROWTH 5 DAYS Performed at [facility], [place] [address] [place], [place] [address]    Report Status 05/13/2020 FINAL  Final    Radiology Studies: No results found. Scheduled  Meds: . sodium chloride   Intravenous Once  . carvedilol  3.125 mg Oral BID WC  . chlorhexidine gluconate (MEDLINE KIT)  15 mL Mouth Rinse BID  . Chlorhexidine Gluconate Cloth  6 each Topical Daily  . clonazePAM  1 mg Per Tube TID  . darbepoetin (ARANESP) injection - NON-DIALYSIS  60 mcg Subcutaneous Q Mon-1800  . feeding supplement (PROSource TF)  90 mL Per Tube TID  . fentaNYL  1 patch Transdermal Q72H   And  . fentaNYL  1 patch Transdermal Q72H  . insulin aspart  0-9 Units Subcutaneous Q4H  . levothyroxine  125 mcg Per Tube Q0600  . mouth rinse  15 mL Mouth Rinse 10 times per day  . multivitamin with minerals  1 tablet Per Tube Daily  . nutrition supplement (JUVEN)  1 packet Per Tube BID BM  . pantoprazole sodium  40 mg Per Tube Daily  . sertraline  100 mg Per Tube Daily  . sodium  chloride flush  10-40 mL Intracatheter Q12H  . traZODone  100 mg Per Tube QHS   Continuous Infusions: . feeding supplement (OSMOLITE 1.5 CAL) 1,000 mL (05/15/20 2331)     LOS: 17 days    Time spent: 35 mins.   Shawna Clamp, MD [facility]   If 7PM-7AM, please contact night-coverage

## 2020-05-17 DIAGNOSIS — R7881 Bacteremia: Secondary | ICD-10-CM | POA: Diagnosis not present

## 2020-05-17 DIAGNOSIS — Z9911 Dependence on respirator [ventilator] status: Secondary | ICD-10-CM | POA: Diagnosis not present

## 2020-05-17 DIAGNOSIS — R601 Generalized edema: Secondary | ICD-10-CM | POA: Diagnosis not present

## 2020-05-17 DIAGNOSIS — I5020 Unspecified systolic (congestive) heart failure: Secondary | ICD-10-CM | POA: Diagnosis not present

## 2020-05-17 DIAGNOSIS — J9622 Acute and chronic respiratory failure with hypercapnia: Secondary | ICD-10-CM | POA: Diagnosis not present

## 2020-05-17 DIAGNOSIS — N179 Acute kidney failure, unspecified: Secondary | ICD-10-CM | POA: Diagnosis not present

## 2020-05-17 DIAGNOSIS — J9621 Acute and chronic respiratory failure with hypoxia: Secondary | ICD-10-CM | POA: Diagnosis not present

## 2020-05-17 DIAGNOSIS — I959 Hypotension, unspecified: Secondary | ICD-10-CM | POA: Diagnosis not present

## 2020-05-17 DIAGNOSIS — A4159 Other Gram-negative sepsis: Secondary | ICD-10-CM | POA: Diagnosis not present

## 2020-05-17 LAB — CBC
HCT: 21.8 % — ABNORMAL LOW (ref 39.0–52.0)
HCT: 24.6 % — ABNORMAL LOW (ref 39.0–52.0)
Hemoglobin: 6.9 g/dL — CL (ref 13.0–17.0)
Hemoglobin: 7.7 g/dL — ABNORMAL LOW (ref 13.0–17.0)
MCH: 28.9 pg (ref 26.0–34.0)
MCH: 28.7 pg (ref 26.0–34.0)
MCHC: 31.7 g/dL (ref 30.0–36.0)
MCHC: 31.3 g/dL (ref 30.0–36.0)
MCV: 91.2 fL (ref 80.0–100.0)
MCV: 91.8 fL (ref 80.0–100.0)
Platelets: 132 10*3/uL — ABNORMAL LOW (ref 150–400)
Platelets: 137 10*3/uL — ABNORMAL LOW (ref 150–400)
RBC: 2.39 MIL/uL — ABNORMAL LOW (ref 4.22–5.81)
RBC: 2.68 MIL/uL — ABNORMAL LOW (ref 4.22–5.81)
RDW: 16.8 % — ABNORMAL HIGH (ref 11.5–15.5)
RDW: 16.8 % — ABNORMAL HIGH (ref 11.5–15.5)
WBC: 6.7 10*3/uL (ref 4.0–10.5)
WBC: 8 10*3/uL (ref 4.0–10.5)
nRBC: 0 % (ref 0.0–0.2)
nRBC: 0 % (ref 0.0–0.2)

## 2020-05-17 LAB — GLUCOSE, CAPILLARY
Glucose-Capillary: 101 mg/dL — ABNORMAL HIGH (ref 70–99)
Glucose-Capillary: 107 mg/dL — ABNORMAL HIGH (ref 70–99)
Glucose-Capillary: 101 mg/dL — ABNORMAL HIGH (ref 70–99)
Glucose-Capillary: 102 mg/dL — ABNORMAL HIGH (ref 70–99)
Glucose-Capillary: 132 mg/dL — ABNORMAL HIGH (ref 70–99)
Glucose-Capillary: 132 mg/dL — ABNORMAL HIGH (ref 70–99)

## 2020-05-17 LAB — RENAL FUNCTION PANEL
Albumin: 1.8 g/dL — ABNORMAL LOW (ref 3.5–5.0)
Anion gap: 11 (ref 5–15)
BUN: 209 mg/dL — ABNORMAL HIGH (ref 6–20)
CO2: 26 mmol/L (ref 22–32)
Calcium: 8.7 mg/dL — ABNORMAL LOW (ref 8.9–10.3)
Chloride: 103 mmol/L (ref 98–111)
Creatinine, Ser: 2.26 mg/dL — ABNORMAL HIGH (ref 0.61–1.24)
GFR, Estimated: 34 mL/min — ABNORMAL LOW (ref >60)
Glucose, Bld: 115 mg/dL — ABNORMAL HIGH (ref 70–99)
Phosphorus: 8.7 mg/dL — ABNORMAL HIGH (ref 2.5–4.6)
Potassium: 5.5 mmol/L — ABNORMAL HIGH (ref 3.5–5.1)
Sodium: 140 mmol/L (ref 135–145)

## 2020-05-17 LAB — HEMOGLOBIN AND HEMATOCRIT, BLOOD
HCT: 21.9 % — ABNORMAL LOW (ref 39.0–52.0)
Hemoglobin: 7 g/dL — ABNORMAL LOW (ref 13.0–17.0)

## 2020-05-17 MED ORDER — FUROSEMIDE 10 MG/ML IJ SOLN
80.0000 mg | Freq: Once | INTRAMUSCULAR | Status: AC
  Administered 2020-05-17: 80 mg via INTRAVENOUS
  Filled 2020-05-17: qty 8

## 2020-05-17 MED ORDER — CLONAZEPAM 0.5 MG PO TBDP
0.5000 mg | ORAL_TABLET | Freq: Three times a day (TID) | ORAL | Status: DC
  Administered 2020-05-17 – 2020-05-21 (×12): 0.5 mg
  Filled 2020-05-17 (×12): qty 1

## 2020-05-17 MED ORDER — ALBUMIN HUMAN 25 % IV SOLN
12.5000 g | Freq: Once | INTRAVENOUS | Status: AC
  Administered 2020-05-17: 12.5 g via INTRAVENOUS
  Filled 2020-05-17: qty 50

## 2020-05-17 MED ORDER — FENTANYL 100 MCG/HR TD PT72
1.0000 | MEDICATED_PATCH | TRANSDERMAL | Status: DC
  Administered 2020-05-17 – 2020-06-04 (×7): 1 via TRANSDERMAL
  Filled 2020-05-17 (×8): qty 1

## 2020-05-17 MED ORDER — FENTANYL 75 MCG/HR TD PT72
1.0000 | MEDICATED_PATCH | TRANSDERMAL | Status: DC
  Administered 2020-05-17 – 2020-06-04 (×7): 1 via TRANSDERMAL
  Filled 2020-05-17 (×8): qty 1

## 2020-05-17 MED ORDER — SODIUM ZIRCONIUM CYCLOSILICATE 5 G PO PACK: 10.0000 g | PACK | Freq: Two times a day (BID) | ORAL | Status: DC

## 2020-05-17 MED ORDER — SODIUM ZIRCONIUM CYCLOSILICATE 5 G PO PACK
10.0000 g | PACK | Freq: Two times a day (BID) | ORAL | Status: AC
  Administered 2020-05-17 (×2): 10 g
  Filled 2020-05-17 (×2): qty 2

## 2020-05-17 MED ORDER — PANTOPRAZOLE SODIUM 40 MG PO PACK
40.0000 mg | PACK | Freq: Two times a day (BID) | ORAL | Status: DC
  Administered 2020-05-17 – 2020-05-27 (×20): 40 mg
  Filled 2020-05-17 (×22): qty 20

## 2020-05-17 NOTE — Progress Notes (Signed)
Patient ID: Phillip Klein, male   DOB: March 11, 1965, 55 y.o.   MRN: [id number] [facility] Progress Note   Assessment/ Plan:   1. Acute kidney Injury: Secondary to ischemic ATN from pressor requiring septic shock (with background history of chronic hypotension).  Creatinine essentially unchanged overnight with continued rise of BUN/severe azotemia.  Nonoliguric overnight and without any uremic signs on exam.  He does not have any acute indications for dialysis and would likely suffer a significant worsening of his quality of life if he is on chronic dialysis. 2.  Hyperkalemia: Secondary to renal insufficiency with ongoing supplemental nutrition.  Will give Lokelma and furosemide. 3.  Sepsis secondary to [name] bacteremia: Status post completion of antibiotic course with meropenem and removal of Hickman catheter thought to be source of infection.  Sepsis markers have improved. 4.  Severe protein calorie malnutrition: Ongoing supplemental nutrition/tube feeds. 5.  History of diastolic heart failure: He has significant anasarca in part from his protein calorie malnutrition/low albumin and associated third spacing with preceding use of Florinef.  I will give him a single dose of intravenous albumin/furosemide today to help improve volume status/potassium. 6.  Chronic ventilator dependent respiratory failure, quadriplegia and multiple pressure wounds/ulcers: With ongoing management for chronic pain and awaiting transfer to LTAC.  Subjective:   Without acute events overnight-treated for hyperkalemia yesterday evening with Lokelma.   Objective:   BP 135/80   Pulse 66   Temp 98.1 F (36.7 C) (Oral)   Resp 20   Ht _0  (1.88 m)   Wt 114.4 kg   SpO2 97%   BMI 32.38 kg/m   Intake/Output Summary (Last 24 hours) at 05/17/2020 0646 Last data filed at 05/17/2020 0600 Gross per 24 hour  Intake 1560 ml  Output 2150 ml  Net -590 ml   Weight change: -1.3 kg  Physical Exam: Gen:  Appears comfortable on ventilator via tracheostomy CVS: Pulse regular rhythm, normal rate, S1 and S2 normal Resp: Anteriorly clear to auscultation without any rales or rhonchi Abd: Soft, obese, PEG tube in situ Ext: 2-3+ anasarca with significant scrotal edema  Imaging: No results found.  Labs: BMET Recent Labs  Lab 05/11/20 0425 05/11/20 2048 05/12/20 6503 05/13/20 0425 05/14/20 0429 05/15/20 0327 05/16/20 0523 05/16/20 1300 05/17/20 0415  NA 137 136 136 140 140 139 139  --  140  K 4.4 4.4 4.4 4.4 4.8 4.9 5.2* 5.6* 5.5*  CL 101  --  101 101 103 103 102  --  103  CO2 24  --  _1 --  26  GLUCOSE 125*  --  119* 128* 110* 120* 108*  --  115*  BUN 152*  --  158* 164* 174* 182* 191*  --  209*  CREATININE 2.30*  --  2.20* 2.17* 2.10* 2.08* 2.11*  --  2.26*  CALCIUM 9.0  --  8.7* 8.8* 8.8* 8.9 8.7*  --  8.7*  PHOS 7.5*  --  7.7* 8.0* 7.9* 7.8* 8.2*  --  8.7*   CBC Recent Labs  Lab 05/11/20 0425 05/11/20 1230 05/12/20 0432 05/13/20 0425 05/15/20 0327 05/16/20 0523 05/16/20 2000 05/17/20 0415  WBC 7.7  --  7.2  --   --   --   --   --   HGB 6.7*   < > 7.2*   < > 7.8* 7.1* 7.2* 7.0*  HCT 22.0*   < > 23.3*   < > 24.4* 23.2* 23.4* 21.9*  MCV 92.1  --  92.1  --   --   --   --   --   PLT 139*  --  156  --   --   --   --   --    < > = values in this interval not displayed.    Medications:    . sodium chloride   Intravenous Once  . carvedilol  3.125 mg Per Tube BID WC  . chlorhexidine gluconate (MEDLINE KIT)  15 mL Mouth Rinse BID  . Chlorhexidine Gluconate Cloth  6 each Topical Daily  . clonazePAM  1 mg Per Tube TID  . darbepoetin (ARANESP) injection - NON-DIALYSIS  60 mcg Subcutaneous Q Mon-1800  . feeding supplement (PROSource TF)  90 mL Per Tube TID  . fentaNYL  1 patch Transdermal Q72H   And  . fentaNYL  1 patch Transdermal Q72H  . insulin aspart  0-9 Units Subcutaneous Q4H  . levothyroxine  125 mcg Per Tube Q0600  . mouth rinse  15 mL Mouth Rinse 10  times per day  . multivitamin with minerals  1 tablet Per Tube Daily  . nutrition supplement (JUVEN)  1 packet Per Tube BID BM  . pantoprazole sodium  40 mg Per Tube Daily  . sertraline  100 mg Per Tube Daily  . sodium chloride flush  10-40 mL Intracatheter Q12H  . traZODone  100 mg Per Tube QHS      Elmarie Shiley, MD 05/17/2020, 6:46 AM

## 2020-05-17 NOTE — Progress Notes (Signed)
PROGRESS NOTE    Phillip Klein  KKX:[id number] DOB: 12-May-1965 DOA: 04/08/2020 PCP: [name], MD   Brief Narrative:  This 55 years old male with PMH significant for chronic hypoxic respiratory failure with tracheostomy and vent since 2021 after C-spine surgery, GERD, DM, anemia of chronic disease, quadriplegia, malnutrition, neurogenic orthostatic hypotension, untreated HCV, previous IV drug use, cirrhosis, chronic pain syndrome was sent from [facility] with septic shock.  Patient admitted in the ICU with septic shock secondary to ESBL/ [name] bacteremia.  Patient had Hickman's catheter which was removed,  presumed to be the source of infection.  Patient is getting meropenem.  Patient is also having acute kidney injury which is recovering,  nephrology is following.  Assessment & Plan:   Principal Problem:   Gram-negative bacteremia Active Problems:   Septic shock (HCC)   Pressure injury of skin   Malnutrition of moderate degree   Encounter for feeding tube placement   Hypotension   Quadriplegia (HCC)   Infection due to ESBL-producing [name] pneumoniae   Chronic hepatitis C without hepatic coma (HCC)   Anemia   Jejunostomy tube leak (HCC)   Pleural effusion   Sinus arrest   Pacemaker   HFrEF (heart failure with reduced ejection fraction) (HCC)   Severe malnutrition (HCC)   Anasarca   Acute renal failure superimposed on stage 3b chronic kidney disease (HCC)   Septic shock sec to [name] bacteremia from UTI vs line infection: Patient presented with septic shock at admission. Hickman catheter removed, presumed to be the source of infection. Blood cultures grew [name] bacteremia. UA + Completed meropenem for 10 days from when lines were DC'd, repeat blood cultures negative. Patient remains afebrile, without leukocytosis. Plan was to receive PICC line once antibiotics completed. PICC line inserted 3/8 / Left arm iv line removed. Duplex : No  DVT. Sepsis physiology has resolved. Sepsis resolved.  AKI most likely ATN in setting of septic shock / Anasarca: Nephrology is following. There is no urgent indication for dialysis at this time. Patient has received trials of albumin plus Lasix which was not effective. Nephrology increased Lasix to 120 mg every 8 hours.  Added metolazone 5 mg daily Monitor BMP and urine output. Patient has good urine output 2.75 L/ 24 hr. Serum creatinine and BUN going in opposite direction. Nephrology recommending hold diuretics . Lasix 80 mg iv once given 3/14. Not a hemodialysis candidate due to multiple comorbidities.  CHF Exacerbation (Combined systolic and diastolic): Patient remains with generalized body swelling, scrotal edema Echocardiogram LVEF 40 to 45%.  Global hypokinesis. Hold Lasix 120 mg every 8 hours as per nephro today Cardiology recommended continue diuresis as per nephro.  Jejunostomy tube drainage> improved. Continue feeding through J tube. Appears to be at goal.  Hypotension likely related to orthostatic hypotension. Florinef was discontinued in the ICU, probably contributing to water retention. Midodrine has been stopped. BP been improving , now elevated. Started norvasc 5 mg daily yesterday  Chronic vent dependent hypoxic respiratory failure: Due to quadriplegia. Continue current ventilatory support with trach.  PCCM helping with managing vent setting.  Multiple pressure ulcers/wounds present on admission: Continue Wound care  Chronic pain syndrome: Continue fentanyl patch. Oxycodone to prn  Depression, insomnia Continue Sertraline, trazodone. Decrease clonazepam as slightly more confused today  GERD  Continue Pantroprazole  Anemia of chronic disease HGB drop to 6.8 from 7.1 Transfuse for HGB < 7, 2 U given Post transfusion Hb , 7.2> 7.2 No evidence of bleeding. Stool occult blood negative.  Hb dropped to 6.9 , consider 1 PRBC.   Left Arm  swelling: Removed peripheral line from the left arm. Venous duplex negative for DVT.  Hematuria:  Patient has pinkish urine in the Foley catheter. Urology consulted, no need for any acute intervention. Recheck UA and renal ultrasound if continues to have hematuria. Hematuria resolving.  DVT prophylaxis: Heparin sq Code Status: Full code. Family Communication:  No family at bed side. Disposition Plan:    Status is: Inpatient  Remains inpatient appropriate because:Inpatient level of care appropriate due to severity of illness   Dispo: The patient is from: SNF              Anticipated d/c is to: LTAC              Patient currently is not medically stable to d/c.   Difficult to place patient No.  Awaiting transfer back to [facility] or [facility] pending bed availability.  Consultants:   PCCM.  Procedures: Hickman removed.  Antimicrobials:  Anti-infectives (From admission, onward)   Start     Dose/Rate Route Frequency Ordered Stop   05/11/20 0800  meropenem (MERREM) 1 g in sodium chloride 0.9 % 100 mL IVPB        1 g 200 mL/hr over 30 Minutes Intravenous Every 8 hours 05/11/20 0707 05/12/20 1555   05/11/20 0715  meropenem (MERREM) 1 g in sodium chloride 0.9 % 100 mL IVPB  Status:  Discontinued        1 g 200 mL/hr over 30 Minutes Intravenous Every 8 hours 05/11/20 0706 05/11/20 0707   05/03/20 2200  meropenem (MERREM) 1 g in sodium chloride 0.9 % 100 mL IVPB  Status:  Discontinued        1 g 200 mL/hr over 30 Minutes Intravenous Every 12 hours 05/03/20 1041 05/11/20 0706   04/30/20 1145  meropenem (MERREM) 2 g in sodium chloride 0.9 % 100 mL IVPB  Status:  Discontinued        2 g 200 mL/hr over 30 Minutes Intravenous Every 12 hours 04/30/20 1052 05/03/20 1041   04/30/20 0530  ceFEPIme (MAXIPIME) 2 g in sodium chloride 0.9 % 100 mL IVPB  Status:  Discontinued        2 g 200 mL/hr over 30 Minutes Intravenous Every 12 hours 04/11/2020 1852 04/30/20 1051   04/23/2020 1852  vancomycin  variable dose per unstable renal function (pharmacist dosing)  Status:  Discontinued         Does not apply See admin instructions 05/03/2020 1852 04/30/20 1051   04/24/2020 1630  vancomycin (VANCOREADY) IVPB 2000 mg/400 mL        2,000 mg 200 mL/hr over 120 Minutes Intravenous  Once 05/02/2020 1621 04/10/2020 2008   04/28/2020 1630  ceFEPIme (MAXIPIME) 2 g in sodium chloride 0.9 % 100 mL IVPB        2 g 200 mL/hr over 30 Minutes Intravenous  Once 04/28/2020 1621 04/25/2020 1749      Subjective: Patient was seen and examined at bedside. Overnight events noted. Patient reports feeling better. Patient still appeared swollen, with significant scrotal swelling.   Patient has tracheostomy tube, on the ventilator with full mechanical support.   He has been on aggressive diuresis, with good urine output.   He denies any burning urination. Urine is still pinkish , getting clearer.  Objective: Vitals:   05/17/20 1100 05/17/20 1115 05/17/20 1117 05/17/20 1200  BP: (!) 141/77   (!) 143/82  Pulse: 71 67  78  Resp: _0 Temp:   97.6 F (36.4 C)   TempSrc:   Oral   SpO2: 91% 95%  93%  Weight:      Height:        Intake/Output Summary (Last 24 hours) at 05/17/2020 1323 Last data filed at 05/17/2020 1200 Gross per 24 hour  Intake 1944.54 ml  Output 2900 ml  Net -955.46 ml   Filed Weights   05/15/20 0500 05/16/20 0500 05/17/20 0414  Weight: 112.7 kg 115.7 kg 114.4 kg    Examination:  General exam: Appears calm and comfortable, chronically ill-appearing.  Not in any distress.   Patient has tracheostomy tube. Significantly swollen, colostomy bag on left side. Respiratory system: Clear to auscultation. Respiratory effort normal. Cardiovascular system: S1 & S2 heard, RRR. No JVD, murmurs, rubs, gallops or clicks. No pedal edema. Gastrointestinal system: Abdomen is nondistended, soft and nontender. No organomegaly or masses felt.   Normal bowel sounds heard. Scrotal swelling ++ Central nervous  system: Alert and oriented x 2.  Extremities: 2+ pitting edema.  No cyanosis no clubbing. Skin: multiple pressure lesions on multiple locations. Psychiatry: Not assessed.     Data Reviewed: I have personally reviewed following labs and imaging studies  CBC: Recent Labs  Lab 05/11/20 0425 05/11/20 1230 05/12/20 0432 05/13/20 0425 05/15/20 0327 05/16/20 0523 05/16/20 2000 05/17/20 0415 05/17/20 1003  WBC 7.7  --  7.2  --   --   --   --   --  6.7  HGB 6.7*   < > 7.2*   < > 7.8* 7.1* 7.2* 7.0* 6.9*  HCT 22.0*   < > 23.3*   < > 24.4* 23.2* 23.4* 21.9* 21.8*  MCV 92.1  --  92.1  --   --   --   --   --  91.2  PLT 139*  --  156  --   --   --   --   --  132*   < > = values in this interval not displayed.   Basic Metabolic Panel: Recent Labs  Lab 05/11/20 0425 05/11/20 2048 05/12/20 0432 05/13/20 0425 05/14/20 0429 05/15/20 0327 05/16/20 0523 05/16/20 1300 05/17/20 0415  NA 137   < > 136 140 140 139 139  --  140  K 4.4   < > 4.4 4.4 4.8 4.9 5.2* 5.6* 5.5*  CL 101  --  101 101 103 103 102  --  103  CO2 24  --  _1 --  26  GLUCOSE 125*  --  119* 128* 110* 120* 108*  --  115*  BUN 152*  --  158* 164* 174* 182* 191*  --  209*  CREATININE 2.30*  --  2.20* 2.17* 2.10* 2.08* 2.11*  --  2.26*  CALCIUM 9.0  --  8.7* 8.8* 8.8* 8.9 8.7*  --  8.7*  MG 1.7  --  1.8  --  1.8  --  1.7  --   --   PHOS 7.5*  --  7.7* 8.0* 7.9* 7.8* 8.2*  --  8.7*   < > = values in this interval not displayed.   GFR: Estimated Creatinine Clearance: 51.4 mL/min (A) (by C-G formula based on SCr of 2.26 mg/dL (H)). Liver Function Tests: Recent Labs  Lab 05/11/20 0425 05/12/20 0076 05/13/20 0425 05/14/20 0429 05/15/20 0327 05/16/20 0523 05/17/20 0415  AST 36  --   --   --   --   --   --  ALT 20  --   --   --   --   --   --   ALKPHOS 217*  --   --   --   --   --   --   BILITOT 0.6  --   --   --   --   --   --   PROT 6.3*  --   --   --   --   --   --   ALBUMIN 2.0*   < > 1.8* 2.0* 2.0*  1.8* 1.8*   < > = values in this interval not displayed.   No results for input(s): LIPASE, AMYLASE in the last 168 hours. No results for input(s): AMMONIA in the last 168 hours. Coagulation Profile: Recent Labs  Lab 05/11/20 0637  INR 1.2   Cardiac Enzymes: No results for input(s): CKTOTAL, CKMB, CKMBINDEX, TROPONINI in the last 168 hours. BNP (last 3 results) No results for input(s): PROBNP in the last 8760 hours. HbA1C: No results for input(s): HGBA1C in the last 72 hours. CBG: Recent Labs  Lab 05/16/20 1922 05/16/20 2316 05/17/20 0320 05/17/20 0729 05/17/20 1116  GLUCAP 118* 106* 101* 107* 101*   Lipid Profile: No results for input(s): CHOL, HDL, LDLCALC, TRIG, CHOLHDL, LDLDIRECT in the last 72 hours. Thyroid Function Tests: No results for input(s): TSH, T4TOTAL, FREET4, T3FREE, THYROIDAB in the last 72 hours. Anemia Panel: No results for input(s): VITAMINB12, FOLATE, FERRITIN, TIBC, IRON, RETICCTPCT in the last 72 hours. Sepsis Labs: No results for input(s): PROCALCITON, LATICACIDVEN in the last 168 hours.  Recent Results (from the past 240 hour(s))  Culture, blood (Routine X 2) w Reflex to ID Panel     Status: None   Collection Time: 05/08/20 12:44 PM   Specimen: BLOOD RIGHT HAND  Result Value Ref Range Status   Specimen Description BLOOD RIGHT HAND  Final   Special Requests   Final    BOTTLES DRAWN AEROBIC ONLY Blood Culture results may not be optimal due to an excessive volume of blood received in culture bottles   Culture   Final    NO GROWTH 5 DAYS Performed at [facility], [place] [address] [place], [place] [address]    Report Status 05/13/2020 FINAL  Final  Culture, blood (Routine X 2) w Reflex to ID Panel     Status: None   Collection Time: 05/08/20 12:45 PM   Specimen: BLOOD LEFT HAND  Result Value Ref Range Status   Specimen Description BLOOD LEFT HAND  Final   Special Requests   Final    BOTTLES DRAWN AEROBIC ONLY Blood Culture results may  not be optimal due to an excessive volume of blood received in culture bottles   Culture   Final    NO GROWTH 5 DAYS Performed at [facility], [place] [address] [place], [place] [address]    Report Status 05/13/2020 FINAL  Final    Radiology Studies: No results found. Scheduled Meds: . carvedilol  3.125 mg Per Tube BID WC  . chlorhexidine gluconate (MEDLINE KIT)  15 mL Mouth Rinse BID  . Chlorhexidine Gluconate Cloth  6 each Topical Daily  . clonazePAM  0.5 mg Per Tube TID  . darbepoetin (ARANESP) injection - NON-DIALYSIS  60 mcg Subcutaneous Q Mon-1800  . feeding supplement (PROSource TF)  90 mL Per Tube TID  . fentaNYL  1 patch Transdermal Q72H   And  . fentaNYL  1 patch Transdermal Q72H  .  levothyroxine  125 mcg Per Tube Q0600  . mouth rinse  15 mL Mouth Rinse 10 times per day  . multivitamin with minerals  1 tablet Per Tube Daily  . nutrition supplement (JUVEN)  1 packet Per Tube BID BM  . pantoprazole sodium  40 mg Per Tube BID  . sertraline  100 mg Per Tube Daily  . sodium chloride flush  10-40 mL Intracatheter Q12H  . sodium zirconium cyclosilicate  10 g Per Tube BID  . traZODone  100 mg Per Tube QHS   Continuous Infusions: . feeding supplement (OSMOLITE 1.5 CAL) 1,000 mL (05/16/20 2011)     LOS: 18 days    Time spent: 35 mins.   Shawna Clamp, MD [facility]   If 7PM-7AM, please contact night-coverage

## 2020-05-17 NOTE — Progress Notes (Signed)
Patient has multiple wounds including: vertebral column, middle sacrum, left ischial tuberosity, right ischial tuberosity, right hip, abdominal incision, left lateral lower leg, right lower leg, right elbow, left elbow, and right arm tear are were changed at this time. Vertebral column, middle sacrum, left ischial tuberosity, and right ischial tuberosity were all pink with slough in the wound bed and oozing serosanguinous drainage with wet to dry dressings changed. Abdominal incision and G/J tube dressing were changed at 1400. G/J tube was clean, dry, intact with no drainage and redness around site with split gauze (drawtex) placed. Abdominal wound was pink with green/yellow drainage and slough, Aquacel and ABD pad changed. Right lower leg and left lower leg wounds both with purulent drainage, Aquacel and ABD's replaced. Sites assessed below the foams but did not need changing.  Jayme Cloud 05/17/20 1635

## 2020-05-17 NOTE — Progress Notes (Signed)
Progress Note  Patient Name: Phillip Klein Date of Encounter: 05/17/2020  [facility] Cardiologist: Sanda Klein, MD; [facility]  Subjective   Patient notes chronic pain. Improved with fentanyl and oxcodone but not resolved.  Chronic Trach.  Inpatient Medications    Scheduled Meds:  sodium chloride   Intravenous Once   carvedilol  3.125 mg Per Tube BID WC   chlorhexidine gluconate (MEDLINE KIT)  15 mL Mouth Rinse BID   Chlorhexidine Gluconate Cloth  6 each Topical Daily   clonazePAM  1 mg Per Tube TID   darbepoetin (ARANESP) injection - NON-DIALYSIS  60 mcg Subcutaneous Q Mon-1800   feeding supplement (PROSource TF)  90 mL Per Tube TID   fentaNYL  1 patch Transdermal Q72H   And   fentaNYL  1 patch Transdermal Q72H   insulin aspart  0-9 Units Subcutaneous Q4H   levothyroxine  125 mcg Per Tube Q0600   mouth rinse  15 mL Mouth Rinse 10 times per day   multivitamin with minerals  1 tablet Per Tube Daily   nutrition supplement (JUVEN)  1 packet Per Tube BID BM   pantoprazole sodium  40 mg Per Tube Daily   sertraline  100 mg Per Tube Daily   sodium chloride flush  10-40 mL Intracatheter Q12H   sodium zirconium cyclosilicate  10 g Per Tube BID   traZODone  100 mg Per Tube QHS   Continuous Infusions:  feeding supplement (OSMOLITE 1.5 CAL) 1,000 mL (05/16/20 2011)   PRN Meds: acetaminophen, albuterol, docusate, fentaNYL (SUBLIMAZE) injection, hydrALAZINE, oxyCODONE, polyethylene glycol, prochlorperazine, sodium chloride flush   Vital Signs    Vitals:   05/17/20 0700 05/17/20 0733 05/17/20 0800 05/17/20 0805  BP: 136/73  137/74 137/74  Pulse: 72  72 72  Resp: '20  18 20  ' Temp:  97.7 F (36.5 C)    TempSrc:  Oral    SpO2: 96%  97% 98%  Weight:      Height:        Intake/Output Summary (Last 24 hours) at 05/17/2020 0913 Last data filed at 05/17/2020 0800 Gross per 24 hour  Intake 1350 ml  Output 2575 ml  Net -1225 ml   Last 3 Weights 05/17/2020  05/16/2020 05/15/2020  Weight (lbs) 252 lb 3.3 oz 255 lb 1.2 oz 248 lb 7.3 oz  Weight (kg) 114.4 kg 115.7 kg 112.7 kg      Telemetry    SR without pacing - Personally Reviewed  ECG    SR 77 low voltage ECG from 05/17/19- Personally Reviewed  Physical Exam   GEN: Chronically ill appearing, quadriplegic, anasarcic  Neck: JVD difficult to assess; chronic trach Cardiac: RRR, 2/6 systolic murmur  Respiratory: Poor effort, rhonchi throughout GI: Soft, nontender, non-distended  MS: Gross anasarca 3+ pitting edema Neuro:  Quadriplegic Psych: Depressed affect   Labs    High Sensitivity Troponin:   Recent Labs  Lab 04/25/2020 1606 04/19/2020 2200  TROPONINIHS 15 31*      Chemistry Recent Labs  Lab 05/11/20 0425 05/11/20 2048 05/15/20 0327 05/16/20 0523 05/16/20 1300 05/17/20 0415  NA 137   < > 139 139  --  140  K 4.4   < > 4.9 5.2* 5.6* 5.5*  CL 101   < > 103 102  --  103  CO2 24   < > 26 27  --  26  GLUCOSE 125*   < > 120* 108*  --  115*  BUN 152*   < >  182* 191*  --  209*  CREATININE 2.30*   < > 2.08* 2.11*  --  2.26*  CALCIUM 9.0   < > 8.9 8.7*  --  8.7*  PROT 6.3*  --   --   --   --   --   ALBUMIN 2.0*   < > 2.0* 1.8*  --  1.8*  AST 36  --   --   --   --   --   ALT 20  --   --   --   --   --   ALKPHOS 217*  --   --   --   --   --   BILITOT 0.6  --   --   --   --   --   GFRNONAA 33*   < > 38* 37*  --  34*  ANIONGAP 12   < > 10 10  --  11   < > = values in this interval not displayed.     Hematology Recent Labs  Lab 05/11/20 0425 05/11/20 1230 05/12/20 0432 05/13/20 0425 05/16/20 0523 05/16/20 2000 05/17/20 0415  WBC 7.7  --  7.2  --   --   --   --   RBC 2.39*  --  2.53*  --   --   --   --   HGB 6.7*   < > 7.2*   < > 7.1* 7.2* 7.0*  HCT 22.0*   < > 23.3*   < > 23.2* 23.4* 21.9*  MCV 92.1  --  92.1  --   --   --   --   MCH 28.0  --  28.5  --   --   --   --   MCHC 30.5  --  30.9  --   --   --   --   RDW 16.7*  --  16.8*  --   --   --   --   PLT 139*  --   156  --   --   --   --    < > = values in this interval not displayed.    BNPNo results for input(s): BNP, PROBNP in the last 168 hours.   DDimer No results for input(s): DDIMER in the last 168 hours.   Radiology    No results found.  Cardiac Studies   ECHO:04/30/2020 1. Left ventricular ejection fraction, by estimation, is 45 to 50%. The  left ventricle has mildly decreased function. The left ventricle  demonstrates global hypokinesis. Left ventricular diastolic parameters are  consistent with Grade II diastolic  dysfunction (pseudonormalization).  2. Right ventricular systolic function is moderately reduced.  3. The pericardial effusion is anterior to the right ventricle and  circumferential.   ECHO:06/08/2019 INTERPRETATION ---------------------------------------------------------------  NORMAL LEFT VENTRICULAR SYSTOLIC FUNCTION WITH MILD LVH  MILD RV SYSTOLIC DYSFUNCTION (See above)  VALVULAR REGURGITATION: TRIVIAL MR, TRIVIAL TR  NO VALVULAR STENOSIS  INSUFFICIENT TR TO ESTIMATED RVSP   Patient Profile     55 y.o. 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  Assessment & Plan    #Anasarca: # HFrEF # AKI on CKD -albumin and lasix challenge per renale -Not HD candidate due to comorbidities - trial of Coreg 3.125 mg PO BID tried today;  reasonable, but if frequent pacing re-occurs would hold - post septic shock; BUN still elevated - no compelling reason for 5 mg PO daily amiodarone at this time, agree with hold; presently no plans to restart  # Quadraplegia #Dysautonomia: # HTN - when BP drop re-occurs, which may reasonably happen through course, would attempt wean of BB and may need to resume midodrine first prior to restart of florinef - SBP goal < 170 and can add hydralazine if needed, would not use long acting agents  #Pacemaker:  -Continue to monitor  #Septic Shock from [name] bacteremia:resolved.  [facility] will sign off.   Medication Recommendations:  DC amiodarone (done) and midodrine for hypotension if needed (described above) Other recommendations (labs, testing, etc):  NA Follow up as an outpatient:  Peri-discharge, if no plans to follow up at [facility]; we are happy to arrange follow up  Discussed with critical card physician and nursing team     For questions or updates, please contact [facility] Please consult [email or web address] for contact info under        Signed, Werner Lean, MD  05/17/2020, 9:13 AM

## 2020-05-17 NOTE — Progress Notes (Signed)
NAME:  Phillip Klein, MRN:  [id number], DOB:  September 03, 1965, LOS: 18 ADMISSION DATE:  04/12/2020, CONSULTATION DATE:  2/27 REFERRING MD:  Silverio Lay, CHIEF COMPLAINT:  Sepsis   Brief History:  55 yo male who presented from [facility]. Admitted 2/24 with septic shock, ESBL [name]. Hickman catheter removed with concern for source of infection. Oliguric AKI , with baseline creatinine 1.8.   Past Medical History:  Chronic hypoxic respiratory failure/ tstomy/vent since 2021 after c spine surgery Hx of MRSA GERD Type 2 diabetes Anemia of chronic illness Osteomyelitis of cervical spine  Quadriplegia Spinal epidural abscess C2-C7 and T9-L4 Malnutrition Neurogenic astrostatic hypotension  HFrEF Untreated HCV Previous IVDU Cirrhosis  Chronic pain  Significant Hospital Events:  2/24 Admit with shock in the setting of klebsiella bacteremia  3/2 albumin 3/3 lasix 3/5 trial of SIMV/PS 3/14 Full support PEEP 10, 40% HGB 6.9 (TRH Mgmt)   Consults:  CCM ID renal 3/2  Procedures:  Trach present prior to admission Hickman catheter prior to admission > removed 2/26  Significant Diagnostic Tests:    Micro Data:  Blood 2/24  >> [name] ESBL 1/4 bottle urine  2/26 >> multiple species   Antimicrobials:  cefepime 2/24 >> 2/25 vanc 2/24  >> 2/25  mero 2/25  >>  3/9  Interim History / Subjective:  Remains on vent 10 Peep 40% FIO2 Peak pressures remain above 30, not weaning vent  Objective   Blood pressure 140/73, pulse 67, temperature 97.6 F (36.4 C), temperature source Oral, resp. rate 20, height 6\' 2"  (1.88 m), weight 114.4 kg, SpO2 95 %.    Vent Mode: PRVC FiO2 (%):  [40 %-50 %] 40 % Set Rate:  [20 bmp] 20 bmp Vt Set:  [630 mL] 630 mL PEEP:  [10 cmH20] 10 cmH20 Plateau Pressure:  [24 cmH20-31 cmH20] 30 cmH20   Intake/Output Summary (Last 24 hours) at 05/17/2020 1140 Last data filed at 05/17/2020 1000 Gross per 24 hour  Intake 1905 ml  Output 2620 ml  Net -715 ml    Filed Weights   05/15/20 0500 05/16/20 0500 05/17/20 0414  Weight: 112.7 kg 115.7 kg 114.4 kg    Examination: General: Awake, in bed, unlabored HEENT: MM pink/moist, trach midline c/d/i 7.0 XLT D Neuro: RASS 0, GCS 11T, quadriplegic  CV: S1S2, no m/r/g, NSR 70's PULM:  Chronic trach on ventilator, PRVC 40% 10 peep, thin white secretions, vent assisted breath sounds Extremities: anasarca, extremities warm/dry, thin skin,  2/3+ BLLE pretibial edema, L shoulder yellow/purple aged ecchymosis    Resolved Hospital Problem list   Septic shock  Assessment & Plan:   Chronic Hypoxemic Respiratory Failure in the Setting of Quadriplegia Chronic respiratory failure unlikely unable to be weaned from mechanical ventilation. - Continue current ventilator settings PRVC 630TV 40% PEEP 10 Rate 20. - Wean PEEP and FiO2 for O2 sats greater than 90% - Will continue to follow intermittent chest xray - Trach care per protocol - VAP bundle - Strict NPO - Ensure bowel regimen - consider Hgb goal >7 with saturation goal above   All Other Issues Below per TRH  [name] bacteremia from UTI vs line infection Hickman catheter removed  Pacemaker in Place AKI likely ATN in setting of septic shock Anasarca Jejunostomy tube drainage> improved Hypotension likely related to dysautonomia - off midodrine Chronic pain Depression, insomnia Anemia of chronic disease + ABLA with Oozing from Wounds   [facility] primary - PCCM will continue to f/u 1-2x/week while in hospital   Best practice (  evaluated daily)  Diet: tube feeding Pain/Anxiety/Delirium protocol (if indicated): Fentanyl patch VAP protocol (if indicated): yes DVT prophylaxis: sub q heparin GI prophylaxis: n/a Glucose control: SSI Mobility: bed rest Disposition: Per TRH, to vent SNF when bed available  Goals of Care:  Last date of multidisciplinary goals of care discussion:3/3  Family and staff present: wife, bedside nurse, patient,  [name] Summary of discussion: "keep me alive at all costs" Follow up goals of care discussion due: 3/10   Code Status: full code, further discussion per Palliative Care / TRH  Labs   CBC: Recent Labs  Lab 05/11/20 0425 05/11/20 1230 05/12/20 0432 05/13/20 0425 05/15/20 0327 05/16/20 0523 05/16/20 2000 05/17/20 0415 05/17/20 1003  WBC 7.7  --  7.2  --   --   --   --   --  6.7  HGB 6.7*   < > 7.2*   < > 7.8* 7.1* 7.2* 7.0* 6.9*  HCT 22.0*   < > 23.3*   < > 24.4* 23.2* 23.4* 21.9* 21.8*  MCV 92.1  --  92.1  --   --   --   --   --  91.2  PLT 139*  --  156  --   --   --   --   --  132*   < > = values in this interval not displayed.    Basic Metabolic Panel: Recent Labs  Lab 05/11/20 0425 05/11/20 2048 05/12/20 0432 05/13/20 0425 05/14/20 0429 05/15/20 0327 05/16/20 0523 05/16/20 1300 05/17/20 0415  NA 137   < > 136 140 140 139 139  --  140  K 4.4   < > 4.4 4.4 4.8 4.9 5.2* 5.6* 5.5*  CL 101  --  101 101 103 103 102  --  103  CO2 24  --  25 27 28 26 27   --  26  GLUCOSE 125*  --  119* 128* 110* 120* 108*  --  115*  BUN 152*  --  158* 164* 174* 182* 191*  --  209*  CREATININE 2.30*  --  2.20* 2.17* 2.10* 2.08* 2.11*  --  2.26*  CALCIUM 9.0  --  8.7* 8.8* 8.8* 8.9 8.7*  --  8.7*  MG 1.7  --  1.8  --  1.8  --  1.7  --   --   PHOS 7.5*  --  7.7* 8.0* 7.9* 7.8* 8.2*  --  8.7*   < > = values in this interval not displayed.   GFR: Estimated Creatinine Clearance: 51.4 mL/min (A) (by C-G formula based on SCr of 2.26 mg/dL (H)). Recent Labs  Lab 05/11/20 0425 05/12/20 0432 05/17/20 1003  WBC 7.7 7.2 6.7    Liver Function Tests: Recent Labs  Lab 05/11/20 0425 05/12/20 0432 05/13/20 0425 05/14/20 0429 05/15/20 0327 05/16/20 0523 05/17/20 0415  AST 36  --   --   --   --   --   --   ALT 20  --   --   --   --   --   --   ALKPHOS 217*  --   --   --   --   --   --   BILITOT 0.6  --   --   --   --   --   --   PROT 6.3*  --   --   --   --   --   --   ALBUMIN 2.0*   <  >  1.8* 2.0* 2.0* 1.8* 1.8*   < > = values in this interval not displayed.   No results for input(s): LIPASE, AMYLASE in the last 168 hours. No results for input(s): AMMONIA in the last 168 hours.  ABG    Component Value Date/Time   PHART 7.493 (H) 05/11/2020 2048   PCO2ART 32.3 05/11/2020 2048   PO2ART 100 05/11/2020 2048   HCO3 24.9 05/11/2020 2048   TCO2 26 05/11/2020 2048   ACIDBASEDEF 7.0 (H) 2020-05-20 1639   O2SAT 98.0 05/11/2020 2048     Coagulation Profile: Recent Labs  Lab 05/11/20 0637  INR 1.2    Cardiac Enzymes: No results for input(s): CKTOTAL, CKMB, CKMBINDEX, TROPONINI in the last 168 hours.  HbA1C: Hgb A1c MFr Bld  Date/Time Value Ref Range Status  05/20/20 10:00 PM 5.1 4.8 - 5.6 % Final    Comment:    (NOTE) Pre diabetes:          5.7%-6.4%  Diabetes:              >6.4%  Glycemic control for   <7.0% adults with diabetes     CBG: Recent Labs  Lab 05/16/20 1922 05/16/20 2316 05/17/20 0320 05/17/20 0729 05/17/20 1116  GLUCAP 118* 106* 101* 107* 101*     Frank Pilger Haywood Lasso., MSN, APRN, AGACNP-BC [facility]  05/17/2020 , 11:40 AM   Please see [email or web address] for pager details  From 7a-7p if no response, please call [phone] After hours, please call Elink at [phone]

## 2020-05-17 NOTE — Progress Notes (Signed)
Patient has multiple wounds including: vertebral column, middle sacrum, left ischial tuberosity, right ischial tuberosity, right hip, abdominal incision, left lateral lower leg, right lower leg, right elbow, left elbow, and right arm tear are all clean/dry/intact. Will peel back dressings and change with bath around 1430 with nurse tech.05/17/2020 0800 Phillip Klein, Walter Grima E

## 2020-05-18 DIAGNOSIS — Z515 Encounter for palliative care: Secondary | ICD-10-CM | POA: Diagnosis not present

## 2020-05-18 DIAGNOSIS — R7881 Bacteremia: Secondary | ICD-10-CM | POA: Diagnosis not present

## 2020-05-18 DIAGNOSIS — B370 Candidal stomatitis: Secondary | ICD-10-CM | POA: Diagnosis not present

## 2020-05-18 DIAGNOSIS — I959 Hypotension, unspecified: Secondary | ICD-10-CM | POA: Diagnosis not present

## 2020-05-18 DIAGNOSIS — Z7189 Other specified counseling: Secondary | ICD-10-CM | POA: Diagnosis not present

## 2020-05-18 DIAGNOSIS — A4159 Other Gram-negative sepsis: Secondary | ICD-10-CM | POA: Diagnosis not present

## 2020-05-18 LAB — BASIC METABOLIC PANEL
Anion gap: 10 (ref 5–15)
BUN: 217 mg/dL — ABNORMAL HIGH (ref 6–20)
CO2: 27 mmol/L (ref 22–32)
Calcium: 9 mg/dL (ref 8.9–10.3)
Chloride: 104 mmol/L (ref 98–111)
Creatinine, Ser: 2.28 mg/dL — ABNORMAL HIGH (ref 0.61–1.24)
GFR, Estimated: 34 mL/min — ABNORMAL LOW (ref >60)
Glucose, Bld: 128 mg/dL — ABNORMAL HIGH (ref 70–99)
Potassium: 5.2 mmol/L — ABNORMAL HIGH (ref 3.5–5.1)
Sodium: 141 mmol/L (ref 135–145)

## 2020-05-18 LAB — RENAL FUNCTION PANEL
Albumin: 1.9 g/dL — ABNORMAL LOW (ref 3.5–5.0)
Anion gap: 11 (ref 5–15)
BUN: 219 mg/dL — ABNORMAL HIGH (ref 6–20)
CO2: 25 mmol/L (ref 22–32)
Calcium: 8.9 mg/dL (ref 8.9–10.3)
Chloride: 103 mmol/L (ref 98–111)
Creatinine, Ser: 2.37 mg/dL — ABNORMAL HIGH (ref 0.61–1.24)
GFR, Estimated: 32 mL/min — ABNORMAL LOW (ref >60)
Glucose, Bld: 112 mg/dL — ABNORMAL HIGH (ref 70–99)
Phosphorus: 9.4 mg/dL — ABNORMAL HIGH (ref 2.5–4.6)
Potassium: 5.2 mmol/L — ABNORMAL HIGH (ref 3.5–5.1)
Sodium: 139 mmol/L (ref 135–145)

## 2020-05-18 LAB — HEMOGLOBIN AND HEMATOCRIT, BLOOD
HCT: 23.2 % — ABNORMAL LOW (ref 39.0–52.0)
Hemoglobin: 7.2 g/dL — ABNORMAL LOW (ref 13.0–17.0)

## 2020-05-18 LAB — GLUCOSE, CAPILLARY
Glucose-Capillary: 106 mg/dL — ABNORMAL HIGH (ref 70–99)
Glucose-Capillary: 77 mg/dL (ref 70–99)
Glucose-Capillary: 100 mg/dL — ABNORMAL HIGH (ref 70–99)
Glucose-Capillary: 99 mg/dL (ref 70–99)
Glucose-Capillary: 114 mg/dL — ABNORMAL HIGH (ref 70–99)
Glucose-Capillary: 104 mg/dL — ABNORMAL HIGH (ref 70–99)

## 2020-05-18 MED ORDER — SODIUM ZIRCONIUM CYCLOSILICATE 5 G PO PACK
5.0000 g | PACK | Freq: Once | ORAL | Status: AC
  Administered 2020-05-18: 5 g
  Filled 2020-05-18: qty 1

## 2020-05-18 MED ORDER — ALBUMIN HUMAN 25 % IV SOLN
12.5000 g | Freq: Once | INTRAVENOUS | Status: AC
  Administered 2020-05-18: 12.5 g via INTRAVENOUS
  Filled 2020-05-18: qty 50

## 2020-05-18 MED ORDER — FLUCONAZOLE 50 MG PO TABS
50.0000 mg | ORAL_TABLET | Freq: Every day | ORAL | Status: AC
  Administered 2020-05-19 – 2020-05-27 (×9): 50 mg via ORAL
  Filled 2020-05-18 (×9): qty 1

## 2020-05-18 MED ORDER — SODIUM CHLORIDE 0.9 % IV SOLN
INTRAVENOUS | Status: DC | PRN
  Administered 2020-05-18: 500 mL via INTRAVENOUS
  Administered 2020-05-25: 250 mL via INTRAVENOUS

## 2020-05-18 MED ORDER — FREE WATER
200.0000 mL | Freq: Three times a day (TID) | Status: DC
  Administered 2020-05-18 – 2020-05-19 (×4): 200 mL

## 2020-05-18 MED ORDER — SODIUM CHLORIDE 0.9 % IV SOLN
32.0000 ug | Freq: Once | INTRAVENOUS | Status: AC
  Administered 2020-05-18: 32 ug via INTRAVENOUS
  Filled 2020-05-18: qty 8

## 2020-05-18 MED ORDER — FUROSEMIDE 10 MG/ML IJ SOLN
80.0000 mg | Freq: Once | INTRAMUSCULAR | Status: AC
  Administered 2020-05-18: 80 mg via INTRAVENOUS
  Filled 2020-05-18: qty 8

## 2020-05-18 MED ORDER — NEPRO/CARBSTEADY PO LIQD
1000.0000 mL | ORAL | Status: DC
  Administered 2020-05-18: 1000 mL
  Filled 2020-05-18: qty 1000

## 2020-05-18 MED ORDER — FLUCONAZOLE 100 MG PO TABS
100.0000 mg | ORAL_TABLET | Freq: Once | ORAL | Status: AC
  Administered 2020-05-18: 100 mg via ORAL
  Filled 2020-05-18: qty 1

## 2020-05-18 MED ORDER — AMIODARONE IV BOLUS ONLY 150 MG/100ML: 150.0000 mg | Freq: Once | INTRAVENOUS | Status: DC

## 2020-05-18 MED ORDER — PROSOURCE TF PO LIQD
45.0000 mL | Freq: Every day | ORAL | Status: DC
  Administered 2020-05-18 – 2020-06-03 (×71): 45 mL
  Filled 2020-05-18 (×71): qty 45

## 2020-05-18 MED ORDER — NEPRO/CARBSTEADY PO LIQD
1000.0000 mL | ORAL | Status: DC
  Administered 2020-05-19 – 2020-05-22 (×4): 1000 mL
  Filled 2020-05-18 (×9): qty 1000

## 2020-05-18 NOTE — Progress Notes (Signed)
Nutrition Follow-up  DOCUMENTATION CODES:   Non-severe (moderate) malnutrition in context of chronic illness  INTERVENTION:   Continue tube feeds via J-port:   - Nepro at 55 ml/hr (1320 ml/day)  - ProSource TF 45 ml 5 times per day   Provides 2576 kcal, 162 grams of protein, and 960 ml of free water daily.   Monitor TF tolerance.  -Continue Juven BID, each packet provides 80 calories, 8 grams of carbohydrate, 2.5  grams of protein (collagen), 7 grams of L-arginine and 7 grams of L-glutamine; supplement contains CaHMB, Vitamins C, E, B12 and Zinc to promote wound healing.  -Continue MVI with minerals via tube daily.  NUTRITION DIAGNOSIS:   Moderate Malnutrition related to chronic illness (CHF, cirrhosis) as evidenced by moderate fat depletion,severe muscle depletion.  Ongoing  GOAL:   Patient will meet greater than or equal to 90% of their needs  Met   MONITOR:   Vent status,Labs,Weight trends,TF tolerance,Skin,I & O's  REASON FOR ASSESSMENT:   Ventilator,Consult Enteral/tube feeding initiation and management  ASSESSMENT:   55 year old male who presented to the ED on 2/24 from [facility] with hypotension, fever. Pt was scheduled to have revision of J-tube on 2/24 and was found to be hypotensive. PMH of vent dependence via trach, quadriplegia after cervical spine surgery in summer of 2021, s/p ex-lap for G-tube malfunction, multiple pressure injuries, GERD, T2DM, anemia, CHF, previous IVDA, cirrhosis. Pt admitted with septic shock.  Patient has a PEG-J tube. Patient has been tolerating Osmolite 1.5 TF via J port at goal rate. Nephrology changed formula to Nepro today. Will adjust goal rate to meet nutrition needs.  Receiving free water 200 ml q 8 hours. Will need to monitor for TF tolerance with TF change. Will be receiving more fiber and fat, and less free water.   Patient is on chronic ventilatory support via trach.  MV: 11.6 L/min Temp (24hrs), Avg:97.3 F (36.3  C), Min:94.3 F (34.6 C), Max:98.4 F (36.9 C)   Labs reviewed. K 5.2, BUN 217, creatinine 2.28 CBG: 106-77-100  Medications reviewed and include Aranesp, Lasix, MVI, Juven.  Admit weight: 107.5 kg  Current weight: 110 kg    UOP: 2,777.5 mL x 24 hrs Stool output: 350 mL x 24 hours  I&O's: +5.5 L since admit  Diet Order:   Diet Order            Diet NPO time specified  Diet effective now                 EDUCATION NEEDS:   No education needs have been identified at this time  Skin:  Skin Assessment: Skin Integrity Issues: Skin Integrity Issues:: Incisions DTI: R leg Stage I: R leg Stage III: L elbow Stage IV: vertebral column, sacrum, bilateral ischial tuberosity, R hip, R lower leg Unstageable: L leg x 2, R leg x 3, R elbow, L arm Incisions: closed abdomen Other: skin tear right arm  Last BM:  3/15 type 7 (colostomy)  Height:   Ht Readings from Last 1 Encounters:  05/10/20 6' 2" (1.88 m)    Weight:   Wt Readings from Last 1 Encounters:  05/18/20 110 kg    Ideal Body Weight:  86.4 kg  BMI:  Body mass index is 31.14 kg/m.  Estimated Nutritional Needs:   Kcal:  2400-2600  Protein:  150-170 grams  Fluid:  >/= 2.0 L   Lucas Mallow, RD, LDN, CNSC Please refer to Amion for contact information.

## 2020-05-18 NOTE — Progress Notes (Signed)
SLP Cancellation Note  Patient Details Name: Phillip Klein MRN: [id number] DOB: Aug 18, 1965   Cancelled treatment:   D/W K. Mahan, Palliative medicine, with decision that pursuing further swallow assessments would not offer benefit or change plan of care for Mr. Kniss.  SLP service will sign off. (See note from 05/14/20).  Mads Borgmeyer L. Samson Frederic, MA CCC/SLP Acute Rehabilitation Services Office number [phone] Pager [phone]         Carolan Shiver 05/18/2020, 12:59 PM

## 2020-05-18 NOTE — Progress Notes (Signed)
eLink Physician-Brief Progress Note Patient Name: Phillip Klein DOB: 07-15-1965 MRN: [id number]   Date of Service  05/18/2020  HPI/Events of Note  K+ 5.2  eICU Interventions  Lokelma 5 gm via gastric tube x 1.        Phillip Klein 05/18/2020, 5:14 AM

## 2020-05-18 NOTE — Progress Notes (Addendum)
Patient ID: Phillip Klein, male   DOB: [birth year], 55 y.o.   MRN: [id number] [facility] Progress Note   Assessment/ Plan:   1. Acute kidney Injury: Secondary to ischemic ATN from pressor requiring septic shock (with background history of chronic hypotension).  BUN/creatinine slightly higher this morning without any uremic signs or symptoms.  Nonoliguric overnight with response to diuretic/albumin combination; will order this as scheduled today.  Would not recommend long-term dialysis as this is likely to worsen his quality of life given burden of comorbidities/mobility challenges.  2.  Hyperkalemia: Secondary to renal insufficiency with ongoing supplemental nutrition.  Status post Lokelma and will get furosemide again today.  Switch from Osmolite to Nepro. 3.  Sepsis secondary to [name] bacteremia: Status post completion of antibiotic course with meropenem and removal of Hickman catheter thought to be source of infection.  Sepsis markers have improved. 4.  Severe protein calorie malnutrition: Ongoing supplemental nutrition/tube feeds. 5.  History of diastolic heart failure: He has significant anasarca in part from his protein calorie malnutrition/low albumin and associated third spacing with preceding use of Florinef.  Now on scheduled albumin/furosemide. 6.  Chronic ventilator dependent respiratory failure, quadriplegia and multiple pressure wounds/ulcers: With ongoing management for chronic pain and awaiting transfer to LTAC.  Subjective:   Retreated for mild hyperkalemia overnight.  He inquires about "getting out of here".  Objective:   BP (!) 148/88   Pulse 67   Temp 97.7 F (36.5 C) (Oral)   Resp 20   Ht '6\' 2"'  (1.88 m)   Wt 110 kg   SpO2 99%   BMI 31.14 kg/m   Intake/Output Summary (Last 24 hours) at 05/18/2020 0703 Last data filed at 05/18/2020 0600 Gross per 24 hour  Intake 2147.78 ml  Output 3127.5 ml  Net -979.72 ml   Weight change: -4.4 kg  Physical  Exam: Gen: Comfortably resting on ventilator via tracheostomy CVS: Pulse regular rhythm, normal rate, S1 and S2 normal Resp: Anteriorly clear to auscultation without any rales or rhonchi Abd: Soft, obese, PEG tube in situ Ext: 2-3+ anasarca with significant scrotal edema  Imaging: No results found.  Labs: BMET Recent Labs  Lab 05/12/20 0432 05/13/20 0425 05/14/20 0429 05/15/20 0327 05/16/20 0523 05/16/20 1300 05/17/20 0415 05/18/20 0218 05/18/20 0541  NA 136 140 140 139 139  --  140 139 141  K 4.4 4.4 4.8 4.9 5.2* 5.6* 5.5* 5.2* 5.2*  CL 101 101 103 103 102  --  103 103 104  CO2 '25 27 28 26 27  ' --  '26 25 27  ' GLUCOSE 119* 128* 110* 120* 108*  --  115* 112* 128*  BUN 158* 164* 174* 182* 191*  --  209* 219* 217*  CREATININE 2.20* 2.17* 2.10* 2.08* 2.11*  --  2.26* 2.37* 2.28*  CALCIUM 8.7* 8.8* 8.8* 8.9 8.7*  --  8.7* 8.9 9.0  PHOS 7.7* 8.0* 7.9* 7.8* 8.2*  --  8.7* 9.4*  --    CBC Recent Labs  Lab 05/12/20 0432 05/13/20 0425 05/17/20 0415 05/17/20 1003 05/17/20 1619 05/18/20 0218  WBC 7.2  --   --  6.7 8.0  --   HGB 7.2*   < > 7.0* 6.9* 7.7* 7.2*  HCT 23.3*   < > 21.9* 21.8* 24.6* 23.2*  MCV 92.1  --   --  91.2 91.8  --   PLT 156  --   --  132* 137*  --    < > = values in this interval  not displayed.    Medications:    . carvedilol  3.125 mg Per Tube BID WC  . chlorhexidine gluconate (MEDLINE KIT)  15 mL Mouth Rinse BID  . Chlorhexidine Gluconate Cloth  6 each Topical Daily  . clonazePAM  0.5 mg Per Tube TID  . darbepoetin (ARANESP) injection - NON-DIALYSIS  60 mcg Subcutaneous Q Mon-1800  . feeding supplement (PROSource TF)  90 mL Per Tube TID  . fentaNYL  1 patch Transdermal Q72H   And  . fentaNYL  1 patch Transdermal Q72H  . furosemide  80 mg Intravenous Once  . furosemide  80 mg Intravenous Once  . furosemide  80 mg Intravenous Once  . levothyroxine  125 mcg Per Tube Q0600  . mouth rinse  15 mL Mouth Rinse 10 times per day  . multivitamin with  minerals  1 tablet Per Tube Daily  . nutrition supplement (JUVEN)  1 packet Per Tube BID BM  . pantoprazole sodium  40 mg Per Tube BID  . sertraline  100 mg Per Tube Daily  . sodium chloride flush  10-40 mL Intracatheter Q12H  . traZODone  100 mg Per Tube QHS   Elmarie Shiley, MD 05/18/2020, 7:03 AM

## 2020-05-18 NOTE — Progress Notes (Signed)
Daily Progress Note   Patient Name: Phillip Klein       Date: 05/18/2020 DOB: 20-Dec-1965  Age: 55 y.o. MRN#: [id number] Attending Physician: Phillip Clamp, MD Primary Care Physician: Phillip Liberty, MD Admit Date: 04/17/2020  Reason for Consultation/Follow-up: Establishing goals of care  Subjective: Patient awake. Able to tell me his is at [facility]. Not able to elicit his medical issues.  He c/o mouth and throat hurting.  Had discussion with patient regarding his wishes re: code status. I asked him if despite all the interventions we were currently doing to keep him alive- if his heart failed- would he want CPR (chest compressions, shocking, etc) in efforts to bring him back. He stated "no".  I called his HCPOA- Phillip Klein to discuss the above conversation- she would like to wait on changing code status until she is able to discuss with him.   Review of Systems  Unable to perform ROS: Critical illness    Length of Stay: 19  Current Medications: Scheduled Meds:  . carvedilol  3.125 mg Per Tube BID WC  . chlorhexidine gluconate (MEDLINE KIT)  15 mL Mouth Rinse BID  . Chlorhexidine Gluconate Cloth  6 each Topical Daily  . clonazePAM  0.5 mg Per Tube TID  . darbepoetin (ARANESP) injection - NON-DIALYSIS  60 mcg Subcutaneous Q Mon-1800  . feeding supplement (PROSource TF)  90 mL Per Tube TID  . fentaNYL  1 patch Transdermal Q72H   And  . fentaNYL  1 patch Transdermal Q72H  . free water  200 mL Per Tube Q8H  . furosemide  80 mg Intravenous Once  . furosemide  80 mg Intravenous Once  . levothyroxine  125 mcg Per Tube Q0600  . mouth rinse  15 mL Mouth Rinse 10 times per day  . multivitamin with minerals  1 tablet Per Tube Daily  . nutrition supplement (JUVEN)  1 packet Per  Tube BID BM  . pantoprazole sodium  40 mg Per Tube BID  . sertraline  100 mg Per Tube Daily  . sodium chloride flush  10-40 mL Intracatheter Q12H  . traZODone  100 mg Per Tube QHS    Continuous Infusions: . sodium chloride 10 mL/hr at 05/18/20 0800  . albumin human    . albumin human    . feeding supplement (  NEPRO CARB STEADY) 1,000 mL (05/18/20 0953)    PRN Meds: sodium chloride, acetaminophen, albuterol, docusate, fentaNYL (SUBLIMAZE) injection, hydrALAZINE, oxyCODONE, polyethylene glycol, prochlorperazine, sodium chloride flush  Physical Exam Vitals and nursing note reviewed.  HENT:     Mouth/Throat:     Pharynx: Posterior oropharyngeal erythema present.     Comments: White patches on tongue Cardiovascular:     Comments: Diffuse anasarca Neurological:     Mental Status: Mental status is at baseline.             Vital Signs: BP (!) 175/95   Pulse 71   Temp 98.1 F (36.7 C) (Oral)   Resp 20   Ht $R'6\' 2"'hT$  (1.88 m)   Wt 110 kg   SpO2 99%   BMI 31.14 kg/m  SpO2: SpO2: 99 % O2 Device: O2 Device: Ventilator O2 Flow Rate:    Intake/output summary:   Intake/Output Summary (Last 24 hours) at 05/18/2020 1032 Last data filed at 05/18/2020 1013 Gross per 24 hour  Intake 2027.76 ml  Output 3295 ml  Net -1267.24 ml   LBM: Last BM Date: 05/18/20 Baseline Weight: Weight: 107.5 kg Most recent weight: Weight: 110 kg       Palliative Assessment/Data: PPS: 10%      Patient Active Problem List   Diagnosis Date Noted  . Severe malnutrition ([place])   . Anasarca   . Acute renal failure superimposed on stage 3b chronic kidney disease ([place])   . Sinus arrest   . Pacemaker   . HFrEF (heart failure with reduced ejection fraction) ([place])   . Anemia   . Jejunostomy tube leak ([place])   . Pleural effusion   . Quadriplegia ([place]) 05/01/2020  . Gram-negative bacteremia 05/01/2020  . Infection due to ESBL-producing [name] pneumoniae 05/01/2020  . Chronic hepatitis C without  hepatic coma ([place]) 05/01/2020  . Encounter for feeding tube placement   . Hypotension   . Pressure injury of skin 04/30/2020  . Malnutrition of moderate degree 04/30/2020  . Septic shock ([place]) 04/08/2020    Palliative Care Assessment & Plan   Patient Profile: 55 y.o. 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  Admission has been complicated by acute kidney injury which has recovered.  Palliative medicine consulted for goals of care as patient was refusing dressing changes and wound care and other care interventions.  Assessment/Recommendations/Plan   Phillip Klein plans to come to visit patient tomorrow evening after 5- she wishes to confirm code status change with him prior to order being entered  Diflucan per pharmacy dosing for oropharyngeal candidiasis  TOC referral for outpatient Palliative to follow at next venue   Goals of Care and Additional Recommendations:  Limitations on Scope of Treatment: Full Scope Treatment  Code Status:  Full code  Prognosis:   Unable to determine  Discharge Planning:  To Be Determined  Care plan was discussed with patient and [place]  Thank you for allowing the Palliative Medicine Team to assist in the care of this patient.   Total time: 42 minutes  Greater than 50%  of this time was spent counseling and coordinating care related to the above assessment and plan.  Phillip Klein, AGNP-C Palliative Medicine   Please  contact Palliative Medicine Team phone at [phone] for questions and concerns.

## 2020-05-18 NOTE — Progress Notes (Signed)
Dr. Chestine Spore and Dr. Allena Katz discussed trial dose of DDAVP for uremic platelets today to attempt to minimize oozing from wounds. Dr. Allena Katz was amenable to this plan. Will follow exam/labs.  Gershon Mussel., MSN, APRN, AGACNP-BC [facility]  05/18/2020 , 2:39 PM   Please see [email or web address] for pager details  From 7a-7p if no response, please call [phone] After hours, please call Elink at [phone]

## 2020-05-18 NOTE — Progress Notes (Addendum)
eLink Physician-Brief Progress Note Patient Name: Phillip Klein DOB: [birth year] MRN: [id number]   Date of Service  05/18/2020  HPI/Events of Note  K+ 5.2 on specimen drawn at 2:18 a.m., patient reportedly treated for hyperkalemia already.  eICU Interventions  Will order follow up BMP for 7:00 a.m.        Thomasene Lot Miana Politte 05/18/2020, 4:13 AM

## 2020-05-18 NOTE — Progress Notes (Signed)
PROGRESS NOTE    Phillip Klein  PYP:[id number] DOB: October 07, 1965 DOA: 04/23/2020 PCP: [name], MD   Brief Narrative:  This 55 years old male with PMH significant for chronic hypoxic respiratory failure with tracheostomy and vent since 2021 after C-spine surgery, GERD, DM, anemia of chronic disease, quadriplegia, malnutrition, neurogenic orthostatic hypotension, untreated HCV, previous IV drug use, cirrhosis, chronic pain syndrome was sent from [facility] with septic shock.  Patient admitted in the ICU with septic shock secondary to ESBL/ [name] bacteremia.  Patient had Hickman's catheter which was removed,  presumed to be the source of infection.  Patient has completed meropenem for 10 days after removal of the catheter.  Patient is having acute kidney injury which is recovering, Cardiology, Nephrology is following.  PCCM is helping with the vent management.  Patient had multiple chronic bedsores, wound care is following.  Palliative care consulted to discuss goals of care.   Assessment & Plan:   Principal Problem:   Gram-negative bacteremia Active Problems:   Septic shock (HCC)   Pressure injury of skin   Malnutrition of moderate degree   Encounter for feeding tube placement   Hypotension   Quadriplegia (HCC)   Infection due to ESBL-producing [name] pneumoniae   Chronic hepatitis C without hepatic coma (HCC)   Anemia   Jejunostomy tube leak (HCC)   Pleural effusion   Sinus arrest   Pacemaker   HFrEF (heart failure with reduced ejection fraction) (HCC)   Severe malnutrition (HCC)   Anasarca   Acute renal failure superimposed on stage 3b chronic kidney disease (HCC)   Septic shock sec to [name] bacteremia from UTI vs line infection: Patient presented with septic shock at admission. Hickman catheter removed, presumed to be the source of infection. Blood cultures grew [name] bacteremia. UA + Completed meropenem for 10 days from when lines were  DC'd, repeat blood cultures negative. Patient remains afebrile, without leukocytosis. Plan was to receive PICC line once antibiotics completed. PICC line inserted 3/8 / Left arm iv line removed. Duplex : No DVT. Sepsis physiology has resolved. Sepsis resolved.  AKI most likely ATN in setting of septic shock / Anasarca: Nephrology is following. There is no urgent indication for dialysis at this time. Patient has received trials of albumin plus Lasix which was not effective. Nephrology increased Lasix to 120 mg every 8 hours.  Added metolazone 5 mg daily Monitor BMP and urine output. Patient has good urine output 2.75 L/ 24 hr. Serum creatinine and BUN going in opposite direction. Nephrology held diuretics for one day . Lasix 80 mg iv once given 3/14. Not a hemodialysis candidate due to multiple comorbidities. Nephrology started scheduled albumin and Lasix 80 mg daily.  Hyperkalemia could be secondary to AKI: Lokelma as per nephro.  CHF Exacerbation (Combined systolic and diastolic): Patient remains with generalized body swelling, scrotal edema Echocardiogram LVEF 40 to 45%.  Global hypokinesis. Cardiology recommended continue diuresis as per nephro. Continue Lasix as per nephrology.  Jejunostomy tube drainage> improved. Continue feeding through J tube. Appears to be at goal.  Hypotension likely related to orthostatic hypotension. Florinef was discontinued in the ICU, probably contributing to water retention. Midodrine has been stopped. BP been improving , now elevated.  Chronic vent dependent hypoxic respiratory failure: Due to quadriplegia. Continue current ventilatory support with trach.  PCCM helping with managing vent setting.  Multiple pressure ulcers/wounds present on admission: Continue Wound care  Chronic pain syndrome: Continue fentanyl patch. Oxycodone to prn  Depression, insomnia Continue Sertraline, trazodone.  Decrease clonazepam as slightly more  confused today  GERD  Continue Pantroprazole  Anemia of chronic disease HGB drop to 6.8 from 7.1 Transfuse for HGB < 7, 3 U given Post transfusion Hb , 7.2> 7.2 Stool occult blood negative.  Left Arm swelling: Removed peripheral line from the left arm. Venous duplex negative for DVT.  Hematuria:  Patient has pinkish urine in the Foley catheter. Urology consulted, no need for any acute intervention. Recheck UA and renal ultrasound if continues to have hematuria. Hematuria resolving.  DVT prophylaxis: Heparin sq Code Status: Full code. Family Communication:  No family at bed side. Disposition Plan:    Status is: Inpatient  Remains inpatient appropriate because:Inpatient level of care appropriate due to severity of illness   Dispo: The patient is from: SNF              Anticipated d/c is to: LTAC              Patient currently is not medically stable to d/c.   Difficult to place patient No.  Awaiting transfer back to [facility] or [facility] pending bed availability.  Consultants:   PCCM.  Procedures: Hickman removed.  Antimicrobials:  Anti-infectives (From admission, onward)   Start     Dose/Rate Route Frequency Ordered Stop   05/19/20 1000  fluconazole (DIFLUCAN) tablet 50 mg       "Followed by" Linked Group Details   50 mg Oral Daily 05/18/20 1048 05/28/20 0959   05/18/20 1145  fluconazole (DIFLUCAN) tablet 100 mg       "Followed by" Linked Group Details   100 mg Oral  Once 05/18/20 1048 05/18/20 1145   05/11/20 0800  meropenem (MERREM) 1 g in sodium chloride 0.9 % 100 mL IVPB        1 g 200 mL/hr over 30 Minutes Intravenous Every 8 hours 05/11/20 0707 05/12/20 1555   05/11/20 0715  meropenem (MERREM) 1 g in sodium chloride 0.9 % 100 mL IVPB  Status:  Discontinued        1 g 200 mL/hr over 30 Minutes Intravenous Every 8 hours 05/11/20 0706 05/11/20 0707   05/03/20 2200  meropenem (MERREM) 1 g in sodium chloride 0.9 % 100 mL IVPB  Status:  Discontinued        1  g 200 mL/hr over 30 Minutes Intravenous Every 12 hours 05/03/20 1041 05/11/20 0706   04/30/20 1145  meropenem (MERREM) 2 g in sodium chloride 0.9 % 100 mL IVPB  Status:  Discontinued        2 g 200 mL/hr over 30 Minutes Intravenous Every 12 hours 04/30/20 1052 05/03/20 1041   04/30/20 0530  ceFEPIme (MAXIPIME) 2 g in sodium chloride 0.9 % 100 mL IVPB  Status:  Discontinued        2 g 200 mL/hr over 30 Minutes Intravenous Every 12 hours 04/07/2020 1852 04/30/20 1051   04/15/2020 1852  vancomycin variable dose per unstable renal function (pharmacist dosing)  Status:  Discontinued         Does not apply See admin instructions 04/23/2020 1852 04/30/20 1051   05/02/2020 1630  vancomycin (VANCOREADY) IVPB 2000 mg/400 mL        2,000 mg 200 mL/hr over 120 Minutes Intravenous  Once 04/27/2020 1621 04/18/2020 2008   04/28/2020 1630  ceFEPIme (MAXIPIME) 2 g in sodium chloride 0.9 % 100 mL IVPB        2 g 200 mL/hr over 30 Minutes Intravenous  Once 04/13/2020 1621  04/26/2020 1749      Subjective: Patient was seen and examined at bedside. Overnight events noted. Patient reports feeling better. Patient still appeared swollen, with significant scrotal swelling.   Patient has tracheostomy tube, on the ventilator with full mechanical support.   He has been on aggressive diuresis, with good urine output.   He denies any burning urination. Urine is still pinkish , getting clearer.  Objective: Vitals:   05/18/20 0900 05/18/20 1000 05/18/20 1100 05/18/20 1140  BP: (!) 151/87 (!) 146/79 126/76 126/76  Pulse: 79 71 77 83  Resp: _0 Temp:    98.4 F (36.9 C)  TempSrc:    Oral  SpO2: 97% 97% 97% 98%  Weight:      Height:        Intake/Output Summary (Last 24 hours) at 05/18/2020 1432 Last data filed at 05/18/2020 1200 Gross per 24 hour  Intake 2112.73 ml  Output 2990 ml  Net -877.27 ml   Filed Weights   05/16/20 0500 05/17/20 0414 05/18/20 0331  Weight: 115.7 kg 114.4 kg 110 kg     Examination:  General exam: Appears calm and comfortable, chronically ill-appearing.  Not in any distress.   Patient has tracheostomy tube. Significantly swollen, colostomy bag on left side. Respiratory system: Clear to auscultation. Respiratory effort normal. Cardiovascular system: S1 & S2 heard, RRR. No JVD, murmurs, rubs, gallops or clicks. No pedal edema. Gastrointestinal system: Abdomen is nondistended, soft and nontender. No organomegaly or masses felt.   Normal bowel sounds heard. Scrotal swelling ++ Central nervous system: Alert and oriented x 2.  Extremities: 2+ pitting edema.  No cyanosis no clubbing. Skin: multiple pressure lesions on multiple locations. Psychiatry: Not assessed.     Data Reviewed: I have personally reviewed following labs and imaging studies  CBC: Recent Labs  Lab 05/12/20 0432 05/13/20 0425 05/16/20 2000 05/17/20 0415 05/17/20 1003 05/17/20 1619 05/18/20 0218  WBC 7.2  --   --   --  6.7 8.0  --   HGB 7.2*   < > 7.2* 7.0* 6.9* 7.7* 7.2*  HCT 23.3*   < > 23.4* 21.9* 21.8* 24.6* 23.2*  MCV 92.1  --   --   --  91.2 91.8  --   PLT 156  --   --   --  132* 137*  --    < > = values in this interval not displayed.   Basic Metabolic Panel: Recent Labs  Lab 05/12/20 0432 05/13/20 0425 05/14/20 0429 05/15/20 0327 05/16/20 0523 05/16/20 1300 05/17/20 0415 05/18/20 0218 05/18/20 0541  NA 136   < > 140 139 139  --  140 139 141  K 4.4   < > 4.8 4.9 5.2* 5.6* 5.5* 5.2* 5.2*  CL 101   < > 103 103 102  --  103 103 104  CO2 25   < > _1 --  _2 GLUCOSE 119*   < > 110* 120* 108*  --  115* 112* 128*  BUN 158*   < > 174* 182* 191*  --  209* 219* 217*  CREATININE 2.20*   < > 2.10* 2.08* 2.11*  --  2.26* 2.37* 2.28*  CALCIUM 8.7*   < > 8.8* 8.9 8.7*  --  8.7* 8.9 9.0  MG 1.8  --  1.8  --  1.7  --   --   --   --   PHOS 7.7*   < > 7.9*  7.8* 8.2*  --  8.7* 9.4*  --    < > = values in this interval not displayed.   GFR: Estimated Creatinine  Clearance: 50 mL/min (A) (by C-G formula based on SCr of 2.28 mg/dL (H)). Liver Function Tests: Recent Labs  Lab 05/14/20 0429 05/15/20 0327 05/16/20 0523 05/17/20 0415 05/18/20 0218  ALBUMIN 2.0* 2.0* 1.8* 1.8* 1.9*   No results for input(s): LIPASE, AMYLASE in the last 168 hours. No results for input(s): AMMONIA in the last 168 hours. Coagulation Profile: No results for input(s): INR, PROTIME in the last 168 hours. Cardiac Enzymes: No results for input(s): CKTOTAL, CKMB, CKMBINDEX, TROPONINI in the last 168 hours. BNP (last 3 results) No results for input(s): PROBNP in the last 8760 hours. HbA1C: No results for input(s): HGBA1C in the last 72 hours. CBG: Recent Labs  Lab 05/17/20 1916 05/17/20 2315 05/18/20 0316 05/18/20 0837 05/18/20 1145  GLUCAP 132* 132* 106* 77 100*   Lipid Profile: No results for input(s): CHOL, HDL, LDLCALC, TRIG, CHOLHDL, LDLDIRECT in the last 72 hours. Thyroid Function Tests: No results for input(s): TSH, T4TOTAL, FREET4, T3FREE, THYROIDAB in the last 72 hours. Anemia Panel: No results for input(s): VITAMINB12, FOLATE, FERRITIN, TIBC, IRON, RETICCTPCT in the last 72 hours. Sepsis Labs: No results for input(s): PROCALCITON, LATICACIDVEN in the last 168 hours.  No results found for this or any previous visit (from the past 240 hour(s)).  Radiology Studies: No results found. Scheduled Meds: . carvedilol  3.125 mg Per Tube BID WC  . chlorhexidine gluconate (MEDLINE KIT)  15 mL Mouth Rinse BID  . Chlorhexidine Gluconate Cloth  6 each Topical Daily  . clonazePAM  0.5 mg Per Tube TID  . darbepoetin (ARANESP) injection - NON-DIALYSIS  60 mcg Subcutaneous Q Mon-1800  . feeding supplement (PROSource TF)  45 mL Per Tube 5 X Daily  . fentaNYL  1 patch Transdermal Q72H   And  . fentaNYL  1 patch Transdermal Q72H  . [START ON 05/19/2020] fluconazole  50 mg Oral Daily  . free water  200 mL Per Tube Q8H  . furosemide  80 mg Intravenous Once  .  furosemide  80 mg Intravenous Once  . levothyroxine  125 mcg Per Tube Q0600  . mouth rinse  15 mL Mouth Rinse 10 times per day  . multivitamin with minerals  1 tablet Per Tube Daily  . nutrition supplement (JUVEN)  1 packet Per Tube BID BM  . pantoprazole sodium  40 mg Per Tube BID  . sertraline  100 mg Per Tube Daily  . sodium chloride flush  10-40 mL Intracatheter Q12H  . traZODone  100 mg Per Tube QHS   Continuous Infusions: . sodium chloride 10 mL/hr at 05/18/20 1200  . albumin human    . albumin human    . feeding supplement (NEPRO CARB STEADY)       LOS: 19 days    Time spent: 35 mins.   Shawna Clamp, MD [facility]   If 7PM-7AM, please contact night-coverage

## 2020-05-18 NOTE — Progress Notes (Signed)
Pharmacy Antibiotic Note  Phillip Klein is a 55 y.o. male admitted on 05/03/2020 with oropharyngeal candidiasis.  Pharmacy has been consulted for Fluconazole dosing.  CrCl < 50 ml/min based on adjusted body weight.  Plan: Fluconazole 100 mg po x1, then 50 mg po daily for 9 more days. Monitor for resolution of symptoms.  Height: 6\' 2"  (188 cm) Weight: 110 kg (242 lb 8.1 oz) IBW/kg (Calculated) : 82.2  Temp (24hrs), Avg:97.1 F (36.2 C), Min:94.3 F (34.6 C), Max:98.1 F (36.7 C)  Recent Labs  Lab 05/12/20 0432 05/13/20 0425 05/15/20 0327 05/16/20 0523 05/17/20 0415 05/17/20 1003 05/17/20 1619 05/18/20 0218 05/18/20 0541  WBC 7.2  --   --   --   --  6.7 8.0  --   --   CREATININE 2.20*   < > 2.08* 2.11* 2.26*  --   --  2.37* 2.28*   < > = values in this interval not displayed.    Estimated Creatinine Clearance: 50 mL/min (A) (by C-G formula based on SCr of 2.28 mg/dL (H)).    Allergies  Allergen Reactions  . Acetaminophen Other (See Comments)    "SHUTS DOWN MY ORGANS"  . Gabapentin Other (See Comments)    SUICIDAL IDEATION- Suicidal thoughts and actions      Thank you for allowing pharmacy to be a part of this patient's care.  05/20/20, PharmD, [facility] Clinical Pharmacist Please see AMION for all Pharmacists' Contact Phone Numbers 05/18/2020, 10:51 AM

## 2020-05-19 DIAGNOSIS — R601 Generalized edema: Secondary | ICD-10-CM | POA: Diagnosis not present

## 2020-05-19 DIAGNOSIS — R7881 Bacteremia: Secondary | ICD-10-CM | POA: Diagnosis not present

## 2020-05-19 DIAGNOSIS — N170 Acute kidney failure with tubular necrosis: Secondary | ICD-10-CM | POA: Diagnosis not present

## 2020-05-19 DIAGNOSIS — I959 Hypotension, unspecified: Secondary | ICD-10-CM | POA: Diagnosis not present

## 2020-05-19 DIAGNOSIS — J9600 Acute respiratory failure, unspecified whether with hypoxia or hypercapnia: Secondary | ICD-10-CM

## 2020-05-19 DIAGNOSIS — A4159 Other Gram-negative sepsis: Secondary | ICD-10-CM | POA: Diagnosis not present

## 2020-05-19 LAB — RENAL FUNCTION PANEL
Albumin: 2.2 g/dL — ABNORMAL LOW (ref 3.5–5.0)
Anion gap: 12 (ref 5–15)
BUN: 224 mg/dL — ABNORMAL HIGH (ref 6–20)
CO2: 25 mmol/L (ref 22–32)
Calcium: 9 mg/dL (ref 8.9–10.3)
Chloride: 102 mmol/L (ref 98–111)
Creatinine, Ser: 2.33 mg/dL — ABNORMAL HIGH (ref 0.61–1.24)
GFR, Estimated: 33 mL/min — ABNORMAL LOW (ref >60)
Glucose, Bld: 116 mg/dL — ABNORMAL HIGH (ref 70–99)
Phosphorus: 9.4 mg/dL — ABNORMAL HIGH (ref 2.5–4.6)
Potassium: 4.9 mmol/L (ref 3.5–5.1)
Sodium: 139 mmol/L (ref 135–145)

## 2020-05-19 LAB — GLUCOSE, CAPILLARY
Glucose-Capillary: 104 mg/dL — ABNORMAL HIGH (ref 70–99)
Glucose-Capillary: 105 mg/dL — ABNORMAL HIGH (ref 70–99)
Glucose-Capillary: 108 mg/dL — ABNORMAL HIGH (ref 70–99)
Glucose-Capillary: 114 mg/dL — ABNORMAL HIGH (ref 70–99)
Glucose-Capillary: 115 mg/dL — ABNORMAL HIGH (ref 70–99)
Glucose-Capillary: 116 mg/dL — ABNORMAL HIGH (ref 70–99)

## 2020-05-19 LAB — HEMOGLOBIN AND HEMATOCRIT, BLOOD
HCT: 22.3 % — ABNORMAL LOW (ref 39.0–52.0)
Hemoglobin: 6.9 g/dL — CL (ref 13.0–17.0)

## 2020-05-19 LAB — PREPARE RBC (CROSSMATCH)

## 2020-05-19 LAB — MAGNESIUM: Magnesium: 2 mg/dL (ref 1.7–2.4)

## 2020-05-19 MED ORDER — ALBUMIN HUMAN 25 % IV SOLN
12.5000 g | Freq: Once | INTRAVENOUS | Status: AC
  Administered 2020-05-19: 12.5 g via INTRAVENOUS
  Filled 2020-05-19: qty 50

## 2020-05-19 MED ORDER — FUROSEMIDE 10 MG/ML IJ SOLN
120.0000 mg | Freq: Once | INTRAVENOUS | Status: AC
  Administered 2020-05-19: 120 mg via INTRAVENOUS
  Filled 2020-05-19: qty 10

## 2020-05-19 MED ORDER — ESTROGENS CONJUGATED 0.625 MG PO TABS
2.5000 mg | ORAL_TABLET | Freq: Every day | ORAL | Status: DC
  Administered 2020-05-19: 2.5 mg via ORAL
  Filled 2020-05-19 (×2): qty 4

## 2020-05-19 MED ORDER — SODIUM CHLORIDE 0.9% IV SOLUTION: Freq: Once | INTRAVENOUS | Status: AC

## 2020-05-19 NOTE — Progress Notes (Addendum)
Patient ID: Phillip Klein, male   DOB: 03-31-1965, 55 y.o.   MRN: [id number] [facility] Progress Note   Assessment/ Plan:   1. Acute kidney Injury: Secondary to ischemic ATN from pressor requiring septic shock (with background history of chronic hypotension).  Creatinine essentially unchanged with slightly higher BUN-yesterday, I switched his tube feeds to Nepro to try and reduce protein/potassium load.  He is nonoliguric overnight with unimpressive response to albumin/Lasix which I will repeat today for volume unloading based on his anasarca.  He is not a candidate for long-term dialysis and time-limited short-term dialysis may be an option for management of severe azotemia.  2.  Anemia with intermittent bleeding from wounds: Secondary to platelet dysfunction in the setting of severe azotemia- will order conjugated estrogens 2.5 mg daily via tube x 5 days. 3.  Sepsis secondary to [name] bacteremia: Status post completion of antibiotic course with meropenem and removal of Hickman catheter thought to be source of infection.  Sepsis markers have improved. 4.  Severe protein calorie malnutrition: Ongoing supplemental nutrition/tube feeds. 5.  History of diastolic heart failure: He has significant anasarca in part from his protein calorie malnutrition/low albumin and associated third spacing with preceding use of Florinef.  Continue albumin/furosemide for volume unloading. 6.  Chronic ventilator dependent respiratory failure, quadriplegia and multiple pressure wounds/ulcers: With ongoing management for chronic pain/blood loss anemia and awaiting transfer to LTAC.  Subjective:   With significant bleeding from wounds over multiple sites and anemic this morning-getting PRBC transfusion  Objective:   BP (!) 150/89   Pulse 76   Temp 98.2 F (36.8 C) (Oral)   Resp 20   Ht '6\' 2"'  (1.88 m)   Wt 109.2 kg   SpO2 97%   BMI 30.91 kg/m   Intake/Output Summary (Last 24 hours) at  05/19/2020 0651 Last data filed at 05/19/2020 0600 Gross per 24 hour  Intake 3094.45 ml  Output 2180 ml  Net 914.45 ml   Weight change: -0.8 kg  Physical Exam: Gen: Comfortably resting on ventilator via tracheostomy CVS: Pulse regular rhythm, normal rate, S1 and S2 normal Resp: Anteriorly clear to auscultation without any rales or rhonchi Abd: Soft, obese, PEG tube in situ Ext: 2-3+ anasarca with scrotal edema  Imaging: No results found.  Labs: BMET Recent Labs  Lab 05/13/20 0425 05/14/20 0429 05/15/20 0327 05/16/20 0523 05/16/20 1300 05/17/20 0415 05/18/20 0218 05/18/20 0541 05/19/20 0224  NA 140 140 139 139  --  140 139 141 139  K 4.4 4.8 4.9 5.2* 5.6* 5.5* 5.2* 5.2* 4.9  CL 101 103 103 102  --  103 103 104 102  CO2 '27 28 26 27  ' --  '26 25 27 25  ' GLUCOSE 128* 110* 120* 108*  --  115* 112* 128* 116*  BUN 164* 174* 182* 191*  --  209* 219* 217* 224*  CREATININE 2.17* 2.10* 2.08* 2.11*  --  2.26* 2.37* 2.28* 2.33*  CALCIUM 8.8* 8.8* 8.9 8.7*  --  8.7* 8.9 9.0 9.0  PHOS 8.0* 7.9* 7.8* 8.2*  --  8.7* 9.4*  --  9.4*   CBC Recent Labs  Lab 05/17/20 1003 05/17/20 1619 05/18/20 0218 05/19/20 0224  WBC 6.7 8.0  --   --   HGB 6.9* 7.7* 7.2* 6.9*  HCT 21.8* 24.6* 23.2* 22.3*  MCV 91.2 91.8  --   --   PLT 132* 137*  --   --     Medications:    . carvedilol  3.125  mg Per Tube BID WC  . chlorhexidine gluconate (MEDLINE KIT)  15 mL Mouth Rinse BID  . Chlorhexidine Gluconate Cloth  6 each Topical Daily  . clonazePAM  0.5 mg Per Tube TID  . darbepoetin (ARANESP) injection - NON-DIALYSIS  60 mcg Subcutaneous Q Mon-1800  . feeding supplement (PROSource TF)  45 mL Per Tube 5 X Daily  . fentaNYL  1 patch Transdermal Q72H   And  . fentaNYL  1 patch Transdermal Q72H  . fluconazole  50 mg Oral Daily  . free water  200 mL Per Tube Q8H  . levothyroxine  125 mcg Per Tube Q0600  . mouth rinse  15 mL Mouth Rinse 10 times per day  . multivitamin with minerals  1 tablet Per Tube  Daily  . nutrition supplement (JUVEN)  1 packet Per Tube BID BM  . pantoprazole sodium  40 mg Per Tube BID  . sertraline  100 mg Per Tube Daily  . sodium chloride flush  10-40 mL Intracatheter Q12H  . traZODone  100 mg Per Tube QHS   Elmarie Shiley, MD 05/19/2020, 6:51 AM

## 2020-05-19 NOTE — Progress Notes (Signed)
[facility]                                                                              Patient Demographics  Phillip Klein, is a 55 y.o. male, DOB - August 09, 1965, NLG:[id number]  Admit date - 04/07/2020   Admitting Physician Lanier Clam, MD  Outpatient Primary MD for the patient is [name], MD  Outpatient specialists:   LOS - 20  days   Medical records reviewed and are as summarized below:    Chief Complaint  Patient presents with  . Hypotension       Brief summary   55 years old male with PMH significant for chronic hypoxic respiratory failure with tracheostomy and vent since 2021 after C-spine surgery, GERD, DM, anemia of chronic disease, quadriplegia, malnutrition, neurogenic orthostatic hypotension, untreated HCV, previous IV drug use, cirrhosis, chronic pain syndrome was sent from [facility] with septic shock.  Patient admitted in the ICU with septic shock secondary to ESBL/ [name] bacteremia.  Patient had Hickman's catheter which was removed,  presumed to be the source of infection.  Patient has completed meropenem for 10 days after removal of the catheter.  Patient is having acute kidney injury which is recovering, Cardiology, Nephrology is following.  PCCM is assisting with vent management.  Patient had multiple chronic bedsores, wound care is following.  Palliative care consulted to discuss goals of care.  Assessment & Plan    Principal Problem: Septic shock, present on admission secondary to [name] bacteremia, UTI versus line infection  -Hickman catheter presumed to be the source of infection, removed. -Blood cultures + [name] bacteremia, UA positive, culture showed multiple species -Completed meropenem for 10 days on 05/12/2020, repeat blood cultures negative -Sepsis physiology has resolved -PICC line inserted on 3/8, venous Doppler showed no DVT.  Active problems Acute kidney injury secondary to ischemic ATN,  septic shock requiring pressors, chronic hypotension, anasarca -Nephrology following, not a candidate for long-term dialysis, short-term dialysis may be an option -Currently on albumin and Lasix for anasarca, per nephrology has not been effective  Acute blood loss anemia with intermittent bleeding from wounds -Hemoglobin 6.9, receiving 1 unit packed RBC transfusion -Platelet dysfunction in the setting of severe azotemia, nephrology recommending the conjugated estrogen (Premarin) via tube for 5 days  Hypokalemia secondary to AKI -Resolved, received Lokelma  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  BP improved and now elevated  Chronic pain syndrome -Currently stable, continue fentanyl patch, oxycodone  Depression, insomnia Continue sertraline, trazodone, clonazepam  GERD Continue PPI  Left arm swelling -Venous Dopplers negative for DVT  Hematuria -resolving, urology was consulted, no need for any acute intervention  Multiple pressure wounds, present on admission -  Full-thickness vertebral column stage IV -Sacrum stage IV, -Right and left ischial tuberosity, stage IV -Right lateral hip, stage IV -Right and left lower leg lateral, unstageable - Right (unstageable now) and left posterior elbow, stage III - left posterior lower arm, unstageable    Obesity Estimated body mass index is 30.91 kg/m as  calculated from the following:   Height as of this encounter: _0  (1.88 m).   Weight as of this encounter: 109.2 kg.  Code Status:  Full  DVT Prophylaxis:  SCDs Start: 04/27/2020 1811   Level of Care: Level of care: ICU Family Communication: Discussed all imaging results, lab results, explained to the patient  Disposition Plan:     Status is: Inpatient  Remains inpatient appropriate because:Inpatient level of care appropriate due to severity of illness   Dispo: The patient is from: SNF              Anticipated d/c is to: LTAC              Patient currently is not medically stable to d/c. Transfer back to [facility] or select pending bed availability   Difficult to place patient No   Time Spent in minutes   79mns   Procedures:    Consultants:   Critical care Nephrology Palliative medicine Cardiology  Antimicrobials:   Anti-infectives (From admission, onward)   Start     Dose/Rate Route Frequency Ordered Stop   05/19/20 1000  fluconazole (DIFLUCAN) tablet 50 mg       "Followed by" Linked Group Details   50 mg Oral Daily 05/18/20 1048 05/28/20 0959   05/18/20 1145  fluconazole (DIFLUCAN) tablet 100 mg       "Followed by" Linked Group Details   100 mg Oral  Once 05/18/20 1048 05/18/20 1145   05/11/20 0800  meropenem (MERREM) 1 g in sodium chloride 0.9 % 100 mL IVPB        1 g 200 mL/hr over 30 Minutes Intravenous Every 8 hours 05/11/20 0707 05/12/20 1555   05/11/20 0715  meropenem (MERREM) 1 g in sodium chloride 0.9 % 100 mL IVPB  Status:  Discontinued        1 g 200 mL/hr over 30 Minutes Intravenous Every 8 hours 05/11/20 0706 05/11/20 0707   05/03/20 2200  meropenem (MERREM) 1 g in sodium chloride 0.9 % 100 mL IVPB  Status:  Discontinued        1 g 200 mL/hr over 30 Minutes Intravenous Every 12 hours 05/03/20 1041 05/11/20 0706   04/30/20 1145  meropenem (MERREM) 2 g in sodium chloride 0.9 % 100 mL IVPB  Status:  Discontinued        2 g 200 mL/hr over 30 Minutes  Intravenous Every 12 hours 04/30/20 1052 05/03/20 1041   04/30/20 0530  ceFEPIme (MAXIPIME) 2 g in sodium chloride 0.9 % 100 mL IVPB  Status:  Discontinued        2 g 200 mL/hr over 30 Minutes Intravenous Every 12 hours 04/26/2020 1852 04/30/20 1051   04/22/2020 1852  vancomycin variable dose per unstable renal function (pharmacist dosing)  Status:  Discontinued         Does not apply See admin instructions 04/19/2020 1852 04/30/20 1051   04/13/2020 1630  vancomycin (VANCOREADY) IVPB 2000 mg/400 mL        2,000 mg 200 mL/hr over 120 Minutes Intravenous  Once 04/16/2020 1621 04/16/2020 2008   04/24/2020 1630  ceFEPIme (MAXIPIME) 2 g in sodium chloride  0.9 % 100 mL IVPB        2 g 200 mL/hr over 30 Minutes Intravenous  Once 04/12/2020 1621 04/13/2020 1749          Medications  Scheduled Meds: . carvedilol  3.125 mg Per Tube BID WC  . chlorhexidine gluconate (MEDLINE KIT)  15 mL Mouth Rinse BID  . Chlorhexidine Gluconate Cloth  6 each Topical Daily  . clonazePAM  0.5 mg Per Tube TID  . darbepoetin (ARANESP) injection - NON-DIALYSIS  60 mcg Subcutaneous Q Mon-1800  . estrogens (conjugated)  2.5 mg Oral Daily  . feeding supplement (PROSource TF)  45 mL Per Tube 5 X Daily  . fentaNYL  1 patch Transdermal Q72H   And  . fentaNYL  1 patch Transdermal Q72H  . fluconazole  50 mg Oral Daily  . levothyroxine  125 mcg Per Tube Q0600  . mouth rinse  15 mL Mouth Rinse 10 times per day  . multivitamin with minerals  1 tablet Per Tube Daily  . nutrition supplement (JUVEN)  1 packet Per Tube BID BM  . pantoprazole sodium  40 mg Per Tube BID  . sertraline  100 mg Per Tube Daily  . sodium chloride flush  10-40 mL Intracatheter Q12H  . traZODone  100 mg Per Tube QHS   Continuous Infusions: . sodium chloride 10 mL/hr at 05/19/20 1009  . albumin human     Followed by  . furosemide    . albumin human     Followed by  . furosemide    . feeding supplement (NEPRO CARB STEADY) 1,000 mL (05/19/20 0623)   PRN  Meds:.sodium chloride, acetaminophen, albuterol, docusate, fentaNYL (SUBLIMAZE) injection, hydrALAZINE, oxyCODONE, polyethylene glycol, prochlorperazine, sodium chloride flush      Subjective:   Phillip Klein was seen and examined today.  No acute events overnight except hemoglobin down, bleeding intermittently from the wounds.  No acute complaints per the patient, diffuse anasarca.  Good urine output.  No fevers.  Objective:   Vitals:   05/19/20 0806 05/19/20 0853 05/19/20 0900 05/19/20 1000  BP:  (!) 161/97 (!) 166/95 (!) 162/93  Pulse: 73 82 78 76  Resp: _0 Temp:      TempSrc:      SpO2: 96%  97% 97%  Weight:      Height:        Intake/Output Summary (Last 24 hours) at 05/19/2020 1020 Last data filed at 05/19/2020 1009 Gross per 24 hour  Intake 3122.8 ml  Output 2080 ml  Net 1042.8 ml     Wt Readings from Last 3 Encounters:  05/19/20 109.2 kg     Exam  General: Alert and awake, chronically ill-appearing, NAD. + Trach  Cardiovascular: S1 S2 auscultated, no murmurs, RRR  Respiratory: CTA B  Gastrointestinal: Soft, colostomy bag on the left  Ext: anasarca, scrotal swelling, 2+ pitting edema  Neuro: quadriplegia  Musculoskeletal: No digital cyanosis, clubbing  Skin: Multiple pressure sores (as above)  Psych: Normal affect and demeanor   Data Reviewed:  I have personally reviewed following labs and imaging studies  Micro Results No results found for this or any previous visit (from the past 240 hour(s)).  Radiology Reports US RENAL  Result Date: 05/04/2020 CLINICAL DATA:  Renal failure. EXAM: RENAL / URINARY TRACT ULTRASOUND COMPLETE COMPARISON:  None. FINDINGS: Right Kidney: Renal measurements: 11.2 cm x 5.9 cm x 6.2 cm = volume: 216.67 mL. Echogenicity within normal limits. No mass or hydronephrosis  visualized. Left Kidney: Renal measurements: 11.3 cm x 6.3 cm x 6.2 cm = volume: 231.19 mL. Echogenicity within normal limits. No mass or  hydronephrosis visualized. Bladder: Not visualized. Other: None. IMPRESSION: Normal renal ultrasound. Electronically Signed   By: Virgina Norfolk M.D.   On: 05/04/2020 21:35   DG ABDOMEN PEG TUBE LOCATION  Result Date: 04/30/2020 CLINICAL DATA:  Jejunostomy tube leak EXAM: ABDOMEN - 1 VIEW COMPARISON:  Portable exam 1103 hours without priors for comparison FINDINGS: Contrast was injected through the indwelling tube and an image was obtained. Contrast opacifies what is likely the gastric lumen. No extravasation of contrast seen. Radiopaque material within transverse and descending colon question additional recent contrast administration. Lead less pacemaker projects over cardiac silhouette. Surgical clips in LEFT pelvis. IMPRESSION: Injected contrast material opacifies what appears to be stomach rather than jejunum, question gastrostomy tube not jejunostomy tube. No extravasated contrast identified. Additional apparent contrast material within transverse colon and descending colon, question additional recent contrast administration. Electronically Signed   By: Lavonia Dana M.D.   On: 04/30/2020 12:44   DG CHEST PORT 1 VIEW  Result Date: 05/12/2020 CLINICAL DATA:  Shortness of breath EXAM: PORTABLE CHEST 1 VIEW COMPARISON:  None. FINDINGS: Tracheostomy device is present.  Right PICC line tip overlies SVC. Persistent bilateral pleural effusions with bibasilar atelectasis. Left lung aeration is improved. Persistent mild perihilar edema. Similar cardiomediastinal contours. IMPRESSION: Persistent bilateral pleural effusions and bibasilar atelectasis. Persistent but improved probable pulmonary edema. Electronically Signed   By: Macy Mis M.D.   On: 05/12/2020 16:53   DG CHEST PORT 1 VIEW  Result Date: 05/11/2020 CLINICAL DATA:  Shortness of breath, unable to elevate chin EXAM: PORTABLE CHEST 1 VIEW COMPARISON:  Radiograph 05/08/2020 FINDINGS: Tracheostomy tube tip terminates in the mid trachea. Right upper  extremity PICC tip terminates near the superior cavoatrial junction. Telemetry leads overlie the chest. Lung volumes are diminished from comparison exam. Increasing hazy interstitial opacities with a patchy perihilar and basilar predominance and gradient density obscuring the hemidiaphragms likely reflecting worsening pulmonary edema with increasing layering bilateral effusions accentuated by low volumes. Prominence of the cardiomediastinal silhouette, poorly visualized due to overlying opacity. Chin overlies the lung apices. No acute osseous or soft tissue abnormality. IMPRESSION: Worsening pulmonary edema and bilateral effusions. Support devices as above. Electronically Signed   By: Lovena Le M.D.   On: 05/11/2020 20:36   DG Chest Port 1 View  Result Date: 05/08/2020 CLINICAL DATA:  Respiratory failure EXAM: PORTABLE CHEST 1 VIEW COMPARISON:  05/01/2020 FINDINGS: Tracheostomy is unchanged. Right internal jugular central venous catheter has been removed. Pulmonary insufflation is stable. Small bilateral pleural effusions persist with associated bibasilar compressive atelectasis. Mild cardiomegaly is stable. Trace interstitial pulmonary edema has developed. No pneumothorax. Implanted loop recorder and cervical fusion hardware again noted. IMPRESSION: Interval removal of right internal jugular central venous catheter. Stable small bilateral pleural effusions. Interval development of mild pulmonary edema, likely cardiogenic in nature. Electronically Signed   By: Fidela Salisbury MD   On: 05/08/2020 07:12   DG CHEST PORT 1 VIEW  Result Date: 05/01/2020 CLINICAL DATA:  55 year old male status post central line placement. EXAM: PORTABLE CHEST 1 VIEW COMPARISON:  04/30/2020 portable chest. FINDINGS: Portable AP upright view at 0549 hours. Right subclavian versus IJ approach central line catheter configuration is unchanged from yesterday, the catheter tip continues across midline and terminates in the left  innominate vein as before. Stable tracheostomy. Partially visible posterior cervicothoracic junction spine fusion hardware. Small  cardiac event recorder or superficial ICD. Stable cardiac size and mediastinal contours. Continued veiling and confluent bibasilar opacity. No pneumothorax. Pulmonary vascularity appears mildly improved. No overt edema in the upper lungs. IMPRESSION: 1. Unchanged right side central line with tip at the left innominate vein level. Recommend repositioning to avoid innominate vein sclerosis. 2. Decreased pulmonary vascular congestion. Otherwise stable ventilation with evidence of bilateral pleural effusions with lower lobe collapse or consolidation. Electronically Signed   By: Genevie Ann M.D.   On: 05/01/2020 06:27   DG CHEST PORT 1 VIEW  Result Date: 04/30/2020 CLINICAL DATA:  Pleural effusion EXAM: PORTABLE CHEST 1 VIEW COMPARISON:  04/18/2020 FINDINGS: Tracheostomy tube terminates in the mid trachea, 4.7 cm from the carina. Right IJ approach central venous catheter courses to the level of the SVC, redirected superiorly and likely terminating within the left brachiocephalic vein. Consider repositioning. Telemetry leads overlie the chest. Stable appearance of the chest with Veiling opacity in the mid to lower lungs with obscuration of the hemidiaphragms likely reflecting a combination of atelectasis and layering pleural effusion though underlying airspace disease is difficult to fully exclude. Stable cardiomediastinal contours. No acute osseous or soft tissue abnormality. Prior cervical fusion incompletely assessed on this exam. IMPRESSION: 1. Veiling opacity in the mid to lower lungs likely reflecting a combination of atelectasis and layering pleural effusion. Underlying airspace disease is difficult to fully exclude. 2. Right IJ approach central venous catheter courses to the level of the SVC, redirected superiorly and likely terminating within the left brachiocephalic vein.  Electronically Signed   By: Lovena Le M.D.   On: 04/30/2020 05:02   DG Chest Portable 1 View  Result Date: 05/01/2020 CLINICAL DATA:  Hypotension. EXAM: PORTABLE CHEST 1 VIEW COMPARISON:  None. FINDINGS: Dense retrocardiac opacity. Right basilar atelectasis. Possible small left pleural effusion. No visible pneumothorax. Central venous catheter tip projects left of midline, most likely in the left brachiocephalic vein. Tracheostomy tube tip projects 3.7 cm above the carina. Partially imaged cervical ACDF. Mild enlargement the cardiac silhouette. Suspected loop recorder projects over the cardiac silhouette. IMPRESSION: 1. Dense left retrocardiac opacity, concerning for left lower lobe collapse. Aspiration or pneumonia is not excluded. Recommend follow-up to resolution. 2. Possible small left pleural effusion. 3. Central venous catheter tip projects left of midline, most likely in the left brachiocephalic vein. Findings discussed with Perry via telephone at 4:35 PM. Electronically Signed   By: Margaretha Sheffield MD   On: 04/07/2020 16:38   DG Shoulder Left  Result Date: 05/12/2020 CLINICAL DATA:  Left shoulder pain and bruising. EXAM: LEFT SHOULDER - 2+ VIEW COMPARISON:  None. FINDINGS: There is no evidence of fracture or dislocation. There is no evidence of arthropathy or other focal bone abnormality. Soft tissues are unremarkable. IMPRESSION: Negative exam. Electronically Signed   By: Inge Rise M.D.   On: 05/12/2020 15:57   DG Abd Portable 1V  Addendum Date: 04/30/2020   ADDENDUM REPORT: 04/30/2020 15:30 ADDENDUM: This examination was subsequently discussed with Dr. Caron Presume. He reports that the patient has a GJ tube with a single skin entrance in the epigastric area. In light of this information, the tip of the tube is likely in the descending duodenum as correlated with the earlier study. Contrast in the stomach may relate to the previous contrast injection or reflux. No tubing is seen  within the jejunum. This could be further clarified under fluoroscopy or by CT as clinically warranted. Electronically Signed   By: Caryl Comes.D.  On: 04/30/2020 15:30   Result Date: 04/30/2020 CLINICAL DATA:  Injection of contrast into J-tube to confirm location EXAM: PORTABLE ABDOMEN - 1 VIEW COMPARISON:  Previous radiograph earlier the same date. FINDINGS: 1428 hours. The tube is unchanged from the earlier examination with the tip looped in the proximal stomach. Contrast has partially emptied from the stomach into the duodenum. Additional contrast is present in the transverse colon as noted previously. No contrast leak identified. Surgical clips are noted in the left pelvis. IMPRESSION: No significant change. The tip of the percutaneous tube is looped in the proximal stomach. Electronically Signed: By: Richardean Sale M.D. On: 04/30/2020 15:02   VAS [place] UPPER EXTREMITY VENOUS DUPLEX  Result Date: 05/11/2020 UPPER VENOUS STUDY  Indications: Swelling Comparison Study: no prior Performing Technologist: Abram Sander RVS  Examination Guidelines: A complete evaluation includes B-mode imaging, spectral Doppler, color Doppler, and power Doppler as needed of all accessible portions of each vessel. Bilateral testing is considered an integral part of a complete examination. Limited examinations for reoccurring indications may be performed as noted.  Left Findings: +----------+------------+---------+-----------+----------+--------------+ LEFT      CompressiblePhasicitySpontaneousProperties   Summary     +----------+------------+---------+-----------+----------+--------------+ IJV                                                 Not visualized +----------+------------+---------+-----------+----------+--------------+ Subclavian    Full       Yes       Yes                             +----------+------------+---------+-----------+----------+--------------+ Axillary      Full       Yes        Yes                             +----------+------------+---------+-----------+----------+--------------+ Brachial      Full       Yes       Yes                             +----------+------------+---------+-----------+----------+--------------+ Radial        Full                                                 +----------+------------+---------+-----------+----------+--------------+ Ulnar         Full                                                 +----------+------------+---------+-----------+----------+--------------+ Cephalic      None                                                 +----------+------------+---------+-----------+----------+--------------+ Basilic       Full                                                 +----------+------------+---------+-----------+----------+--------------+  Summary:  Left: No evidence of deep vein thrombosis in the upper extremity. Findings consistent with age indeterminate superficial vein thrombosis involving the left cephalic vein.  *See table(s) above for measurements and observations.  Diagnosing physician: Deitra Mayo MD Electronically signed by Deitra Mayo MD on 05/11/2020 at 5:02:39 PM.    Final    ECHOCARDIOGRAM LIMITED  Result Date: 05/14/2020    ECHOCARDIOGRAM LIMITED REPORT   Patient Name:   Phillip Klein Date of Exam: 05/14/2020 Medical Rec #:  [id number]        Height:       74.0 in Accession #:    [id number]       Weight:       251.1 lb Date of Birth:  03-16-1965        BSA:          2.394 m Patient Age:    62 years         BP:           153/88 mmHg Patient Gender: M                HR:           83 bpm. Exam Location:  Inpatient Procedure: Limited Echo, Limited Color Doppler and Cardiac Doppler Indications:    CHF-Acute Diastolic M25.00  History:        Patient has prior history of Echocardiogram examinations, most                 recent 04/30/2020. Pacemaker; Risk Factors:Diabetes.  Sonographer:     Mikki Santee RDCS (AE) Referring Phys: [place]  1. Left ventricular ejection fraction, by estimation, is 55%. The left ventricle has normal function. Left ventricular diastolic parameters were normal.  2. Right ventricular systolic function is normal. The right ventricular size is moderately enlarged. A leadless pacemaker is visualized.  3. Trivial mitral valve regurgitation. FINDINGS  Left Ventricle: Left ventricular ejection fraction, by estimation, is 55%. The left ventricle has normal function. Left ventricular diastolic parameters were normal. Right Ventricle: The right ventricular size is moderately enlarged. Right ventricular systolic function is normal. Mitral Valve: Trivial mitral valve regurgitation. Additional Comments: A leadless pacemaker is visualized.  Diastology LV e' medial:    9.57 cm/s LV E/e' medial:  10.0 LV e' lateral:   14.90 cm/s LV E/e' lateral: 6.4  MITRAL VALVE MV Area (PHT): 3.77 cm MV Decel Time: 201 msec MV E velocity: 95.70 cm/s MV A velocity: 65.60 cm/s MV E/A ratio:  1.46 Cherlynn Kaiser MD Electronically signed by Cherlynn Kaiser MD Signature Date/Time: 05/14/2020/12:56:19 PM    Final    ECHOCARDIOGRAM LIMITED  Result Date: 04/30/2020    ECHOCARDIOGRAM LIMITED REPORT   Patient Name:   Phillip Klein Date of Exam: 04/30/2020 Medical Rec #:  [id number]        Height:       74.0 in Accession #:    [id number]       Weight:       229.9 lb Date of Birth:  June 28, 1965        BSA:          2.306 m Patient Age:    29 years         BP:           100/62 mmHg Patient Gender: M                HR:  80 bpm. Exam Location:  Inpatient Procedure: Limited Echo, Cardiac Doppler and Color Doppler Indications:    CHF  History:        Patient has no prior history of Echocardiogram examinations.                 CHF. Septic shock, Resp, failure, IVDU, cirrhosis, quadriplegia.  Sonographer:    Dustin Flock Referring Phys: [phone] [place]  1.  Left ventricular ejection fraction, by estimation, is 45 to 50%. The left ventricle has mildly decreased function. The left ventricle demonstrates global hypokinesis. Left ventricular diastolic parameters are consistent with Grade II diastolic dysfunction (pseudonormalization).  2. Right ventricular systolic function is moderately reduced.  3. The pericardial effusion is anterior to the right ventricle and circumferential. FINDINGS  Left Ventricle: Left ventricular ejection fraction, by estimation, is 45 to 50%. The left ventricle has mildly decreased function. The left ventricle demonstrates global hypokinesis. Left ventricular diastolic parameters are consistent with Grade II diastolic dysfunction (pseudonormalization). Right Ventricle: Limited echo for LV function. Right ventricular systolic function is moderately reduced. Pericardium: Trivial pericardial effusion is present. The pericardial effusion is anterior to the right ventricle and circumferential. Additional Comments: A pacer wire is visualized. LEFT VENTRICLE PLAX 2D LVIDd:         4.40 cm  Diastology LVIDs:         3.80 cm  LV e' medial:    4.57 cm/s LV PW:         1.10 cm  LV E/e' medial:  15.8 LV IVS:        1.10 cm  LV e' lateral:   4.35 cm/s LVOT diam:     2.80 cm  LV E/e' lateral: 16.6 LVOT Area:     6.16 cm  RIGHT VENTRICLE RV S prime:     7.40 cm/s LEFT ATRIUM         Index LA diam:    3.60 cm 1.56 cm/m   AORTA Ao Root diam: 3.60 cm MITRAL VALVE MV Area (PHT): 3.48 cm    SHUNTS MV Decel Time: 218 msec    Systemic Diam: 2.80 cm MV E velocity: 72.40 cm/s MV A velocity: 44.60 cm/s MV E/A ratio:  1.62 Candee Furbish MD Electronically signed by Candee Furbish MD Signature Date/Time: 04/30/2020/1:53:17 PM    Final    [place] EKG SITE RITE  Result Date: 05/11/2020 If Site Rite image not attached, placement could not be confirmed due to current cardiac rhythm.  [place] EKG SITE RITE  Result Date: 05/01/2020 If [facility] image not attached, placement could not  be confirmed due to current cardiac rhythm.   Lab Data:  CBC: Recent Labs  Lab 05/17/20 0415 05/17/20 1003 05/17/20 1619 05/18/20 0218 05/19/20 0224  WBC  --  6.7 8.0  --   --   HGB 7.0* 6.9* 7.7* 7.2* 6.9*  HCT 21.9* 21.8* 24.6* 23.2* 22.3*  MCV  --  91.2 91.8  --   --   PLT  --  132* 137*  --   --    Basic Metabolic Panel: Recent Labs  Lab 05/14/20 0429 05/15/20 0327 05/16/20 0523 05/16/20 1300 05/17/20 0415 05/18/20 0218 05/18/20 0541 05/19/20 0224  NA 140 139 139  --  140 139 141 139  K 4.8 4.9 5.2* 5.6* 5.5* 5.2* 5.2* 4.9  CL 103 103 102  --  103 103 104 102  CO2 _0 --  _1 GLUCOSE  110* 120* 108*  --  115* 112* 128* 116*  BUN 174* 182* 191*  --  209* 219* 217* 224*  CREATININE 2.10* 2.08* 2.11*  --  2.26* 2.37* 2.28* 2.33*  CALCIUM 8.8* 8.9 8.7*  --  8.7* 8.9 9.0 9.0  MG 1.8  --  1.7  --   --   --   --  2.0  PHOS 7.9* 7.8* 8.2*  --  8.7* 9.4*  --  9.4*   GFR: Estimated Creatinine Clearance: 48.8 mL/min (A) (by C-G formula based on SCr of 2.33 mg/dL (H)). Liver Function Tests: Recent Labs  Lab 05/15/20 0327 05/16/20 0523 05/17/20 0415 05/18/20 0218 05/19/20 0224  ALBUMIN 2.0* 1.8* 1.8* 1.9* 2.2*   No results for input(s): LIPASE, AMYLASE in the last 168 hours. No results for input(s): AMMONIA in the last 168 hours. Coagulation Profile: No results for input(s): INR, PROTIME in the last 168 hours. Cardiac Enzymes: No results for input(s): CKTOTAL, CKMB, CKMBINDEX, TROPONINI in the last 168 hours. BNP (last 3 results) No results for input(s): PROBNP in the last 8760 hours. HbA1C: No results for input(s): HGBA1C in the last 72 hours. CBG: Recent Labs  Lab 05/18/20 1638 05/18/20 1924 05/18/20 2326 05/19/20 0322 05/19/20 0751  GLUCAP 99 114* 104* 104* 115*   Lipid Profile: No results for input(s): CHOL, HDL, LDLCALC, TRIG, CHOLHDL, LDLDIRECT in the last 72 hours. Thyroid Function Tests: No results for input(s): TSH, T4TOTAL,  FREET4, T3FREE, THYROIDAB in the last 72 hours. Anemia Panel: No results for input(s): VITAMINB12, FOLATE, FERRITIN, TIBC, IRON, RETICCTPCT in the last 72 hours. Urine analysis:    Component Value Date/Time   COLORURINE AMBER (A) 05/04/2020 1100   APPEARANCEUR TURBID (A) 05/04/2020 1100   LABSPEC 1.018 05/04/2020 1100   PHURINE 5.0 05/04/2020 1100   GLUCOSEU NEGATIVE 05/04/2020 1100   HGBUR LARGE (A) 05/04/2020 1100   BILIRUBINUR NEGATIVE 05/04/2020 1100   KETONESUR NEGATIVE 05/04/2020 1100   PROTEINUR 100 (A) 05/04/2020 1100   NITRITE NEGATIVE 05/04/2020 1100   LEUKOCYTESUR LARGE (A) 05/04/2020 1100     Ripudeep Rai M.D. [facility] 05/19/2020, 10:20 AM  Available via Epic secure chat 7am-7pm After 7 pm, please refer to night coverage provider listed on amion.

## 2020-05-19 NOTE — Progress Notes (Signed)
eLink Physician-Brief Progress Note Patient Name: Phillip Klein DOB: 11-06-1965 MRN: [id number]   Date of Service  05/19/2020  HPI/Events of Note  Hemoglobin 6.9 gm / dl.  eICU Interventions  Transfuse 1 unit PRBC.        Thomasene Lot Adaline Trejos 05/19/2020, 3:15 AM

## 2020-05-20 DIAGNOSIS — A4159 Other Gram-negative sepsis: Secondary | ICD-10-CM | POA: Diagnosis not present

## 2020-05-20 DIAGNOSIS — J9611 Chronic respiratory failure with hypoxia: Secondary | ICD-10-CM | POA: Diagnosis not present

## 2020-05-20 DIAGNOSIS — I959 Hypotension, unspecified: Secondary | ICD-10-CM | POA: Diagnosis not present

## 2020-05-20 DIAGNOSIS — N170 Acute kidney failure with tubular necrosis: Secondary | ICD-10-CM

## 2020-05-20 DIAGNOSIS — N1832 Chronic kidney disease, stage 3b: Secondary | ICD-10-CM

## 2020-05-20 DIAGNOSIS — Z7189 Other specified counseling: Secondary | ICD-10-CM | POA: Diagnosis not present

## 2020-05-20 DIAGNOSIS — Z930 Tracheostomy status: Secondary | ICD-10-CM | POA: Diagnosis not present

## 2020-05-20 DIAGNOSIS — B370 Candidal stomatitis: Secondary | ICD-10-CM | POA: Diagnosis not present

## 2020-05-20 DIAGNOSIS — R7881 Bacteremia: Secondary | ICD-10-CM | POA: Diagnosis not present

## 2020-05-20 DIAGNOSIS — Z9911 Dependence on respirator [ventilator] status: Secondary | ICD-10-CM | POA: Diagnosis not present

## 2020-05-20 DIAGNOSIS — R601 Generalized edema: Secondary | ICD-10-CM | POA: Diagnosis not present

## 2020-05-20 DIAGNOSIS — Z515 Encounter for palliative care: Secondary | ICD-10-CM | POA: Diagnosis not present

## 2020-05-20 DIAGNOSIS — J9600 Acute respiratory failure, unspecified whether with hypoxia or hypercapnia: Secondary | ICD-10-CM | POA: Diagnosis not present

## 2020-05-20 LAB — RENAL FUNCTION PANEL
Albumin: 2.3 g/dL — ABNORMAL LOW (ref 3.5–5.0)
Anion gap: 15 (ref 5–15)
BUN: 233 mg/dL — ABNORMAL HIGH (ref 6–20)
CO2: 25 mmol/L (ref 22–32)
Calcium: 9.2 mg/dL (ref 8.9–10.3)
Chloride: 102 mmol/L (ref 98–111)
Creatinine, Ser: 2.38 mg/dL — ABNORMAL HIGH (ref 0.61–1.24)
GFR, Estimated: 32 mL/min — ABNORMAL LOW (ref >60)
Glucose, Bld: 117 mg/dL — ABNORMAL HIGH (ref 70–99)
Phosphorus: 9.6 mg/dL — ABNORMAL HIGH (ref 2.5–4.6)
Potassium: 4.6 mmol/L (ref 3.5–5.1)
Sodium: 142 mmol/L (ref 135–145)

## 2020-05-20 LAB — CBC
HCT: 23.4 % — ABNORMAL LOW (ref 39.0–52.0)
Hemoglobin: 7.2 g/dL — ABNORMAL LOW (ref 13.0–17.0)
MCH: 28.2 pg (ref 26.0–34.0)
MCHC: 30.8 g/dL (ref 30.0–36.0)
MCV: 91.8 fL (ref 80.0–100.0)
Platelets: 121 10*3/uL — ABNORMAL LOW (ref 150–400)
RBC: 2.55 MIL/uL — ABNORMAL LOW (ref 4.22–5.81)
RDW: 16.4 % — ABNORMAL HIGH (ref 11.5–15.5)
WBC: 5.1 10*3/uL (ref 4.0–10.5)
nRBC: 0 % (ref 0.0–0.2)

## 2020-05-20 LAB — GLUCOSE, CAPILLARY
Glucose-Capillary: 100 mg/dL — ABNORMAL HIGH (ref 70–99)
Glucose-Capillary: 105 mg/dL — ABNORMAL HIGH (ref 70–99)
Glucose-Capillary: 107 mg/dL — ABNORMAL HIGH (ref 70–99)
Glucose-Capillary: 109 mg/dL — ABNORMAL HIGH (ref 70–99)
Glucose-Capillary: 114 mg/dL — ABNORMAL HIGH (ref 70–99)
Glucose-Capillary: 115 mg/dL — ABNORMAL HIGH (ref 70–99)

## 2020-05-20 MED ORDER — FUROSEMIDE 10 MG/ML IJ SOLN
120.0000 mg | Freq: Three times a day (TID) | INTRAVENOUS | Status: DC
  Administered 2020-05-20 – 2020-05-24 (×13): 120 mg via INTRAVENOUS
  Filled 2020-05-20 (×2): qty 10
  Filled 2020-05-20: qty 2
  Filled 2020-05-20 (×4): qty 10
  Filled 2020-05-20 (×2): qty 12
  Filled 2020-05-20: qty 10
  Filled 2020-05-20: qty 12
  Filled 2020-05-20 (×2): qty 10
  Filled 2020-05-20: qty 12
  Filled 2020-05-20 (×2): qty 10

## 2020-05-20 MED ORDER — ALBUMIN HUMAN 25 % IV SOLN
12.5000 g | Freq: Four times a day (QID) | INTRAVENOUS | Status: AC
  Administered 2020-05-20 – 2020-05-21 (×4): 12.5 g via INTRAVENOUS
  Filled 2020-05-20 (×4): qty 50

## 2020-05-20 MED ORDER — CARVEDILOL 12.5 MG PO TABS
6.2500 mg | ORAL_TABLET | Freq: Two times a day (BID) | ORAL | Status: DC
  Administered 2020-05-20 – 2020-05-28 (×14): 6.25 mg
  Filled 2020-05-20 (×16): qty 1

## 2020-05-20 MED ORDER — ESTROGENS CONJUGATED 0.625 MG PO TABS
5.0000 mg | ORAL_TABLET | Freq: Every day | ORAL | Status: AC
  Administered 2020-05-20 – 2020-05-23 (×4): 5 mg via ORAL
  Filled 2020-05-20 (×4): qty 8

## 2020-05-20 MED ORDER — HYDRALAZINE HCL 20 MG/ML IJ SOLN
10.0000 mg | Freq: Four times a day (QID) | INTRAMUSCULAR | Status: DC | PRN
  Administered 2020-05-22 – 2020-05-27 (×2): 10 mg via INTRAVENOUS
  Filled 2020-05-20 (×2): qty 1

## 2020-05-20 NOTE — Progress Notes (Signed)
[facility]                                                                              Patient Demographics  Phillip Klein, is a 55 y.o. male, DOB - 1966/01/13, CEY:[id number]  Admit date - 04/14/2020   Admitting Physician Lanier Clam, MD  Outpatient Primary MD for the patient is Phillip Liberty, MD  Outpatient specialists:   LOS - 21  days   Medical records reviewed and are as summarized below:    Chief Complaint  Patient presents with  . Hypotension       Brief summary   55 years old male with PMH significant for chronic hypoxic respiratory failure with tracheostomy and vent since 2021 after C-spine surgery, GERD, DM, anemia of chronic disease, quadriplegia, malnutrition, neurogenic orthostatic hypotension, untreated HCV, previous IV drug use, cirrhosis, chronic pain syndrome was sent from [facility] with septic shock.  Patient admitted in the ICU with septic shock secondary to ESBL/ [name] bacteremia.  Patient had Hickman's catheter which was removed,  presumed to be the source of infection.  Patient has completed meropenem for 10 days after removal of the catheter.  Patient is having acute kidney injury which is recovering, Cardiology, Nephrology is following.  PCCM is assisting with vent management.  Patient had multiple chronic bedsores, wound care is following.  Palliative care consulted to discuss goals of care.  Assessment & Plan    Principal Problem: Septic shock, present on admission secondary to [name] bacteremia, UTI versus line infection  -Hickman catheter presumed to be the source of infection, removed. -Blood cultures + [name] bacteremia, UA positive, culture showed multiple species -Completed meropenem for 10 days on 05/12/2020, repeat blood cultures negative -Sepsis physiology has resolved -PICC line inserted on 3/8, venous Doppler showed no DVT.  Active problems Acute kidney injury secondary to ischemic ATN,  septic shock requiring pressors, chronic hypotension, anasarca -Nephrology following, not a candidate for long-term dialysis, short-term dialysis may be an option -Patient was placed on albumin and Lasix for anasarca, per nephrology has not been effective, continue Lasix -Per nephrology, considering trial of dialysis   Acute blood loss anemia with intermittent bleeding from wounds -Platelet dysfunction in the setting of severe azotemia, nephrology recommending the conjugated estrogen (Premarin) via tube for 5 days, started on 3/16 -Hemoglobin 7.2, received 1 unit packed RBCs on 3/16  Essential hypertension -BP has been running high, increase Coreg to 6.25 mg p.o. 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  Nephrology considering trial of dialysis  Chronic ventilator dependent respiratory failure, quadriplegia -Management per CCM, continue ventilatory support with trach -Continue feeding through G-tube, at goal  Hypotension, likely orthostatic hypotension Florinef was discontinued in ICU due to concern for fluid retention, midodrine has been stopped.  BP improved and now elevated  Chronic pain syndrome -Currently stable, continue fentanyl patch, oxycodone  Depression, insomnia Continue sertraline, trazodone, clonazepam  GERD Continue PPI  Left arm swelling -Venous Dopplers negative for DVT  Hematuria -resolving, urology was consulted, no need for any acute intervention  Multiple pressure wounds, present on admission -Full-thickness vertebral column stage IV -Sacrum stage IV, -Right and left ischial tuberosity, stage IV -Right lateral hip,  stage IV -Right and left lower leg lateral, unstageable - Right (unstageable now) and left posterior elbow, stage III - left posterior lower arm, unstageable    Obesity Estimated body mass index is 30.91 kg/m as calculated from the following:   Height as of this encounter: _0  (1.88 m).   Weight as of this encounter: 109.2 kg.  Code Status:  Full  DVT Prophylaxis:  SCDs Start: 04/24/2020 1811   Level of Care: Level of care: ICU Family Communication: Discussed all imaging results, lab results, explained to the patient  Disposition Plan:     Status is: Inpatient  Remains inpatient appropriate because:Inpatient level of care appropriate due to severity of illness   Dispo: The patient is from: SNF              Anticipated d/c is to: LTAC              Patient currently is not medically stable to d/c. Transfer back to [facility] or select pending bed availability and cleared by nephrology   Difficult to place patient No   Time Spent in minutes   73mns   Procedures:    Consultants:   Critical care Nephrology Palliative medicine Cardiology  Antimicrobials:   Anti-infectives (From admission, onward)   Start     Dose/Rate Route Frequency Ordered Stop   05/19/20 1000  fluconazole (DIFLUCAN) tablet 50 mg       "Followed by" Linked Group Details   50 mg Oral Daily 05/18/20 1048 05/28/20 0959   05/18/20 1145  fluconazole (DIFLUCAN) tablet 100 mg       "Followed by" Linked Group Details   100 mg Oral  Once 05/18/20 1048 05/18/20 1145   05/11/20 0800  meropenem (MERREM) 1 g in sodium chloride 0.9 % 100 mL IVPB        1 g 200 mL/hr over 30 Minutes Intravenous Every 8 hours 05/11/20 0707 05/12/20 1555   05/11/20 0715  meropenem (MERREM) 1 g in sodium chloride 0.9 % 100 mL IVPB  Status:  Discontinued        1 g 200 mL/hr over 30 Minutes Intravenous Every 8 hours 05/11/20 0706 05/11/20 0707   05/03/20 2200  meropenem (MERREM) 1 g in sodium chloride 0.9 % 100 mL IVPB  Status:   Discontinued        1 g 200 mL/hr over 30 Minutes Intravenous Every 12 hours 05/03/20 1041 05/11/20 0706   04/30/20 1145  meropenem (MERREM) 2 g in sodium chloride 0.9 % 100 mL IVPB  Status:  Discontinued        2 g 200 mL/hr over 30 Minutes Intravenous Every 12 hours 04/30/20 1052 05/03/20 1041   04/30/20 0530  ceFEPIme (MAXIPIME) 2 g in sodium chloride 0.9 % 100 mL IVPB  Status:  Discontinued        2 g 200 mL/hr over 30 Minutes Intravenous Every 12 hours 04/28/2020 1852 04/30/20 1051   04/18/2020 1852  vancomycin variable dose per unstable renal function (pharmacist dosing)  Status:  Discontinued         Does not apply See admin instructions 04/10/2020 1852 04/30/20 1051  04/10/2020 1630  vancomycin (VANCOREADY) IVPB 2000 mg/400 mL        2,000 mg 200 mL/hr over 120 Minutes Intravenous  Once 04/12/2020 1621 04/28/2020 2008   04/07/2020 1630  ceFEPIme (MAXIPIME) 2 g in sodium chloride 0.9 % 100 mL IVPB        2 g 200 mL/hr over 30 Minutes Intravenous  Once 04/25/2020 1621 04/10/2020 1749         Medications  Scheduled Meds: . carvedilol  6.25 mg Per Tube BID WC  . chlorhexidine gluconate (MEDLINE KIT)  15 mL Mouth Rinse BID  . Chlorhexidine Gluconate Cloth  6 each Topical Daily  . clonazePAM  0.5 mg Per Tube TID  . darbepoetin (ARANESP) injection - NON-DIALYSIS  60 mcg Subcutaneous Q Mon-1800  . estrogens (conjugated)  5 mg Oral Daily  . feeding supplement (PROSource TF)  45 mL Per Tube 5 X Daily  . fentaNYL  1 patch Transdermal Q72H   And  . fentaNYL  1 patch Transdermal Q72H  . fluconazole  50 mg Oral Daily  . levothyroxine  125 mcg Per Tube Q0600  . mouth rinse  15 mL Mouth Rinse 10 times per day  . multivitamin with minerals  1 tablet Per Tube Daily  . nutrition supplement (JUVEN)  1 packet Per Tube BID BM  . pantoprazole sodium  40 mg Per Tube BID  . sertraline  100 mg Per Tube Daily  . sodium chloride flush  10-40 mL Intracatheter Q12H  . traZODone  100 mg Per Tube QHS    Continuous Infusions: . sodium chloride 10 mL/hr at 05/20/20 0700  . feeding supplement (NEPRO CARB STEADY) 1,000 mL (05/20/20 0512)  . furosemide 120 mg (05/20/20 0901)   PRN Meds:.sodium chloride, acetaminophen, albuterol, docusate, fentaNYL (SUBLIMAZE) injection, hydrALAZINE, oxyCODONE, polyethylene glycol, prochlorperazine, sodium chloride flush      Subjective:   Denver Bentson was seen and examined today.  No acute events overnight however patient reports pain and feeling bad today.  Anasarca, no fevers or chills.   Objective:   Vitals:   05/20/20 0700 05/20/20 0743 05/20/20 0800 05/20/20 0833  BP: (!) 152/84  (!) 154/87   Pulse: 70  69   Resp: 18  18   Temp:  97.8 F (36.6 C)    TempSrc:  Oral    SpO2: 98%  98% 98%  Weight:      Height:        Intake/Output Summary (Last 24 hours) at 05/20/2020 1111 Last data filed at 05/20/2020 0700 Gross per 24 hour  Intake 1472.76 ml  Output 1750 ml  Net -277.24 ml     Wt Readings from Last 3 Encounters:  05/19/20 109.2 kg   Physical Exam  General: Alert and oriented, ill-appearing, + trach, on vent  Cardiovascular: S1 S2 clear, RRR. No pedal edema b/l  Respiratory: CTAB, no wheezing, rales or rhonchi  Gastrointestinal: Soft, colostomy bag on the left, PEG  Ext: anasarca with 2+ pitting edema, scrotal swelling  Neuro: no new deficits  Musculoskeletal: No cyanosis, clubbing  Skin: Multiple pressure sores  Psych: Normal affect and demeanor    Data Reviewed:  I have personally reviewed following labs and imaging studies  Micro Results No results found for this or any previous visit (from the past 240 hour(s)).  Radiology Reports US RENAL  Result Date: 05/04/2020 CLINICAL DATA:  Renal failure. EXAM: RENAL / URINARY TRACT ULTRASOUND COMPLETE COMPARISON:  None. FINDINGS: Right Kidney: Renal measurements: 11.2 cm  x 5.9 cm x 6.2 cm = volume: 216.67 mL. Echogenicity within normal limits. No mass or  hydronephrosis visualized. Left Kidney: Renal measurements: 11.3 cm x 6.3 cm x 6.2 cm = volume: 231.19 mL. Echogenicity within normal limits. No mass or hydronephrosis visualized. Bladder: Not visualized. Other: None. IMPRESSION: Normal renal ultrasound. Electronically Signed   By: Virgina Norfolk M.D.   On: 05/04/2020 21:35   DG ABDOMEN PEG TUBE LOCATION  Result Date: 04/30/2020 CLINICAL DATA:  Jejunostomy tube leak EXAM: ABDOMEN - 1 VIEW COMPARISON:  Portable exam 1103 hours without priors for comparison FINDINGS: Contrast was injected through the indwelling tube and an image was obtained. Contrast opacifies what is likely the gastric lumen. No extravasation of contrast seen. Radiopaque material within transverse and descending colon question additional recent contrast administration. Lead less pacemaker projects over cardiac silhouette. Surgical clips in LEFT pelvis. IMPRESSION: Injected contrast material opacifies what appears to be stomach rather than jejunum, question gastrostomy tube not jejunostomy tube. No extravasated contrast identified. Additional apparent contrast material within transverse colon and descending colon, question additional recent contrast administration. Electronically Signed   By: Lavonia Dana M.D.   On: 04/30/2020 12:44   DG CHEST PORT 1 VIEW  Result Date: 05/12/2020 CLINICAL DATA:  Shortness of breath EXAM: PORTABLE CHEST 1 VIEW COMPARISON:  None. FINDINGS: Tracheostomy device is present.  Right PICC line tip overlies SVC. Persistent bilateral pleural effusions with bibasilar atelectasis. Left lung aeration is improved. Persistent mild perihilar edema. Similar cardiomediastinal contours. IMPRESSION: Persistent bilateral pleural effusions and bibasilar atelectasis. Persistent but improved probable pulmonary edema. Electronically Signed   By: Macy Mis M.D.   On: 05/12/2020 16:53   DG CHEST PORT 1 VIEW  Result Date: 05/11/2020 CLINICAL DATA:  Shortness of breath, unable  to elevate chin EXAM: PORTABLE CHEST 1 VIEW COMPARISON:  Radiograph 05/08/2020 FINDINGS: Tracheostomy tube tip terminates in the mid trachea. Right upper extremity PICC tip terminates near the superior cavoatrial junction. Telemetry leads overlie the chest. Lung volumes are diminished from comparison exam. Increasing hazy interstitial opacities with a patchy perihilar and basilar predominance and gradient density obscuring the hemidiaphragms likely reflecting worsening pulmonary edema with increasing layering bilateral effusions accentuated by low volumes. Prominence of the cardiomediastinal silhouette, poorly visualized due to overlying opacity. Chin overlies the lung apices. No acute osseous or soft tissue abnormality. IMPRESSION: Worsening pulmonary edema and bilateral effusions. Support devices as above. Electronically Signed   By: Lovena Le M.D.   On: 05/11/2020 20:36   DG Chest Port 1 View  Result Date: 05/08/2020 CLINICAL DATA:  Respiratory failure EXAM: PORTABLE CHEST 1 VIEW COMPARISON:  05/01/2020 FINDINGS: Tracheostomy is unchanged. Right internal jugular central venous catheter has been removed. Pulmonary insufflation is stable. Small bilateral pleural effusions persist with associated bibasilar compressive atelectasis. Mild cardiomegaly is stable. Trace interstitial pulmonary edema has developed. No pneumothorax. Implanted loop recorder and cervical fusion hardware again noted. IMPRESSION: Interval removal of right internal jugular central venous catheter. Stable small bilateral pleural effusions. Interval development of mild pulmonary edema, likely cardiogenic in nature. Electronically Signed   By: Fidela Salisbury MD   On: 05/08/2020 07:12   DG CHEST PORT 1 VIEW  Result Date: 05/01/2020 CLINICAL DATA:  55 year old male status post central line placement. EXAM: PORTABLE CHEST 1 VIEW COMPARISON:  04/30/2020 portable chest. FINDINGS: Portable AP upright view at 0549 hours. Right subclavian versus  IJ approach central line catheter configuration is unchanged from yesterday, the catheter tip continues across midline and terminates  in the left innominate vein as before. Stable tracheostomy. Partially visible posterior cervicothoracic junction spine fusion hardware. Small cardiac event recorder or superficial ICD. Stable cardiac size and mediastinal contours. Continued veiling and confluent bibasilar opacity. No pneumothorax. Pulmonary vascularity appears mildly improved. No overt edema in the upper lungs. IMPRESSION: 1. Unchanged right side central line with tip at the left innominate vein level. Recommend repositioning to avoid innominate vein sclerosis. 2. Decreased pulmonary vascular congestion. Otherwise stable ventilation with evidence of bilateral pleural effusions with lower lobe collapse or consolidation. Electronically Signed   By: Genevie Ann M.D.   On: 05/01/2020 06:27   DG CHEST PORT 1 VIEW  Result Date: 04/30/2020 CLINICAL DATA:  Pleural effusion EXAM: PORTABLE CHEST 1 VIEW COMPARISON:  05/03/2020 FINDINGS: Tracheostomy tube terminates in the mid trachea, 4.7 cm from the carina. Right IJ approach central venous catheter courses to the level of the SVC, redirected superiorly and likely terminating within the left brachiocephalic vein. Consider repositioning. Telemetry leads overlie the chest. Stable appearance of the chest with Veiling opacity in the mid to lower lungs with obscuration of the hemidiaphragms likely reflecting a combination of atelectasis and layering pleural effusion though underlying airspace disease is difficult to fully exclude. Stable cardiomediastinal contours. No acute osseous or soft tissue abnormality. Prior cervical fusion incompletely assessed on this exam. IMPRESSION: 1. Veiling opacity in the mid to lower lungs likely reflecting a combination of atelectasis and layering pleural effusion. Underlying airspace disease is difficult to fully exclude. 2. Right IJ approach  central venous catheter courses to the level of the SVC, redirected superiorly and likely terminating within the left brachiocephalic vein. Electronically Signed   By: Lovena Le M.D.   On: 04/30/2020 05:02   DG Chest Portable 1 View  Result Date: 04/19/2020 CLINICAL DATA:  Hypotension. EXAM: PORTABLE CHEST 1 VIEW COMPARISON:  None. FINDINGS: Dense retrocardiac opacity. Right basilar atelectasis. Possible small left pleural effusion. No visible pneumothorax. Central venous catheter tip projects left of midline, most likely in the left brachiocephalic vein. Tracheostomy tube tip projects 3.7 cm above the carina. Partially imaged cervical ACDF. Mild enlargement the cardiac silhouette. Suspected loop recorder projects over the cardiac silhouette. IMPRESSION: 1. Dense left retrocardiac opacity, concerning for left lower lobe collapse. Aspiration or pneumonia is not excluded. Recommend follow-up to resolution. 2. Possible small left pleural effusion. 3. Central venous catheter tip projects left of midline, most likely in the left brachiocephalic vein. Findings discussed with [place] via telephone at 4:35 PM. Electronically Signed   By: Margaretha Sheffield MD   On: 04/17/2020 16:38   DG Shoulder Left  Result Date: 05/12/2020 CLINICAL DATA:  Left shoulder pain and bruising. EXAM: LEFT SHOULDER - 2+ VIEW COMPARISON:  None. FINDINGS: There is no evidence of fracture or dislocation. There is no evidence of arthropathy or other focal bone abnormality. Soft tissues are unremarkable. IMPRESSION: Negative exam. Electronically Signed   By: Inge Rise M.D.   On: 05/12/2020 15:57   DG Abd Portable 1V  Addendum Date: 04/30/2020   ADDENDUM REPORT: 04/30/2020 15:30 ADDENDUM: This examination was subsequently discussed with Dr. Caron Presume. He reports that the patient has a GJ tube with a single skin entrance in the epigastric area. In light of this information, the tip of the tube is likely in the descending duodenum  as correlated with the earlier study. Contrast in the stomach may relate to the previous contrast injection or reflux. No tubing is seen within the jejunum. This could be further clarified  under fluoroscopy or by CT as clinically warranted. Electronically Signed   By: Richardean Sale M.D.   On: 04/30/2020 15:30   Result Date: 04/30/2020 CLINICAL DATA:  Injection of contrast into J-tube to confirm location EXAM: PORTABLE ABDOMEN - 1 VIEW COMPARISON:  Previous radiograph earlier the same date. FINDINGS: 1428 hours. The tube is unchanged from the earlier examination with the tip looped in the proximal stomach. Contrast has partially emptied from the stomach into the duodenum. Additional contrast is present in the transverse colon as noted previously. No contrast leak identified. Surgical clips are noted in the left pelvis. IMPRESSION: No significant change. The tip of the percutaneous tube is looped in the proximal stomach. Electronically Signed: By: Richardean Sale M.D. On: 04/30/2020 15:02   VAS [place] UPPER EXTREMITY VENOUS DUPLEX  Result Date: 05/11/2020 UPPER VENOUS STUDY  Indications: Swelling Comparison Study: no prior Performing Technologist: Abram Sander RVS  Examination Guidelines: A complete evaluation includes B-mode imaging, spectral Doppler, color Doppler, and power Doppler as needed of all accessible portions of each vessel. Bilateral testing is considered an integral part of a complete examination. Limited examinations for reoccurring indications may be performed as noted.  Left Findings: +----------+------------+---------+-----------+----------+--------------+ LEFT      CompressiblePhasicitySpontaneousProperties   Summary     +----------+------------+---------+-----------+----------+--------------+ IJV                                                 Not visualized +----------+------------+---------+-----------+----------+--------------+ Subclavian    Full       Yes       Yes                              +----------+------------+---------+-----------+----------+--------------+ Axillary      Full       Yes       Yes                             +----------+------------+---------+-----------+----------+--------------+ Brachial      Full       Yes       Yes                             +----------+------------+---------+-----------+----------+--------------+ Radial        Full                                                 +----------+------------+---------+-----------+----------+--------------+ Ulnar         Full                                                 +----------+------------+---------+-----------+----------+--------------+ Cephalic      None                                                 +----------+------------+---------+-----------+----------+--------------+ Basilic  Full                                                 +----------+------------+---------+-----------+----------+--------------+  Summary:  Left: No evidence of deep vein thrombosis in the upper extremity. Findings consistent with age indeterminate superficial vein thrombosis involving the left cephalic vein.  *See table(s) above for measurements and observations.  Diagnosing physician: Deitra Mayo MD Electronically signed by Deitra Mayo MD on 05/11/2020 at 5:02:39 PM.    Final    ECHOCARDIOGRAM LIMITED  Result Date: 05/14/2020    ECHOCARDIOGRAM LIMITED REPORT   Patient Name:   MATTEUS Katayama Date of Exam: 05/14/2020 Medical Rec #:  [id number]        Height:       74.0 in Accession #:    [id number]       Weight:       251.1 lb Date of Birth:  09-29-1965        BSA:          2.394 m Patient Age:    24 years         BP:           153/88 mmHg Patient Gender: M                HR:           83 bpm. Exam Location:  Inpatient Procedure: Limited Echo, Limited Color Doppler and Cardiac Doppler Indications:    CHF-Acute Diastolic P21.62  History:        Patient has  prior history of Echocardiogram examinations, most                 recent 04/30/2020. Pacemaker; Risk Factors:Diabetes.  Sonographer:    Mikki Santee RDCS (AE) Referring Phys: [place]  1. Left ventricular ejection fraction, by estimation, is 55%. The left ventricle has normal function. Left ventricular diastolic parameters were normal.  2. Right ventricular systolic function is normal. The right ventricular size is moderately enlarged. A leadless pacemaker is visualized.  3. Trivial mitral valve regurgitation. FINDINGS  Left Ventricle: Left ventricular ejection fraction, by estimation, is 55%. The left ventricle has normal function. Left ventricular diastolic parameters were normal. Right Ventricle: The right ventricular size is moderately enlarged. Right ventricular systolic function is normal. Mitral Valve: Trivial mitral valve regurgitation. Additional Comments: A leadless pacemaker is visualized.  Diastology LV e' medial:    9.57 cm/s LV E/e' medial:  10.0 LV e' lateral:   14.90 cm/s LV E/e' lateral: 6.4  MITRAL VALVE MV Area (PHT): 3.77 cm MV Decel Time: 201 msec MV E velocity: 95.70 cm/s MV A velocity: 65.60 cm/s MV E/A ratio:  1.46 Cherlynn Kaiser MD Electronically signed by Cherlynn Kaiser MD Signature Date/Time: 05/14/2020/12:56:19 PM    Final    ECHOCARDIOGRAM LIMITED  Result Date: 04/30/2020    ECHOCARDIOGRAM LIMITED REPORT   Patient Name:   BERNABE Ginsberg Date of Exam: 04/30/2020 Medical Rec #:  [id number]        Height:       74.0 in Accession #:    [id number]       Weight:       229.9 lb Date of Birth:  [birth year]        BSA:          2.306 m Patient Age:  54 years         BP:           100/62 mmHg Patient Gender: M                HR:           80 bpm. Exam Location:  Inpatient Procedure: Limited Echo, Cardiac Doppler and Color Doppler Indications:    CHF  History:        Patient has no prior history of Echocardiogram examinations.                 CHF. Septic shock,  Resp, failure, IVDU, cirrhosis, quadriplegia.  Sonographer:    Dustin Flock Referring Phys: [phone] [place]  1. Left ventricular ejection fraction, by estimation, is 45 to 50%. The left ventricle has mildly decreased function. The left ventricle demonstrates global hypokinesis. Left ventricular diastolic parameters are consistent with Grade II diastolic dysfunction (pseudonormalization).  2. Right ventricular systolic function is moderately reduced.  3. The pericardial effusion is anterior to the right ventricle and circumferential. FINDINGS  Left Ventricle: Left ventricular ejection fraction, by estimation, is 45 to 50%. The left ventricle has mildly decreased function. The left ventricle demonstrates global hypokinesis. Left ventricular diastolic parameters are consistent with Grade II diastolic dysfunction (pseudonormalization). Right Ventricle: Limited echo for LV function. Right ventricular systolic function is moderately reduced. Pericardium: Trivial pericardial effusion is present. The pericardial effusion is anterior to the right ventricle and circumferential. Additional Comments: A pacer wire is visualized. LEFT VENTRICLE PLAX 2D LVIDd:         4.40 cm  Diastology LVIDs:         3.80 cm  LV e' medial:    4.57 cm/s LV PW:         1.10 cm  LV E/e' medial:  15.8 LV IVS:        1.10 cm  LV e' lateral:   4.35 cm/s LVOT diam:     2.80 cm  LV E/e' lateral: 16.6 LVOT Area:     6.16 cm  RIGHT VENTRICLE RV S prime:     7.40 cm/s LEFT ATRIUM         Index LA diam:    3.60 cm 1.56 cm/m   AORTA Ao Root diam: 3.60 cm MITRAL VALVE MV Area (PHT): 3.48 cm    SHUNTS MV Decel Time: 218 msec    Systemic Diam: 2.80 cm MV E velocity: 72.40 cm/s MV A velocity: 44.60 cm/s MV E/A ratio:  1.62 Candee Furbish MD Electronically signed by Candee Furbish MD Signature Date/Time: 04/30/2020/1:53:17 PM    Final    [place] EKG SITE RITE  Result Date: 05/11/2020 If Site Rite image not attached, placement could not be  confirmed due to current cardiac rhythm.  [place] EKG SITE RITE  Result Date: 05/01/2020 If [facility] image not attached, placement could not be confirmed due to current cardiac rhythm.   Lab Data:  CBC: Recent Labs  Lab 05/17/20 1003 05/17/20 1619 05/18/20 0218 05/19/20 0224 05/20/20 0607  WBC 6.7 8.0  --   --  5.1  HGB 6.9* 7.7* 7.2* 6.9* 7.2*  HCT 21.8* 24.6* 23.2* 22.3* 23.4*  MCV 91.2 91.8  --   --  91.8  PLT 132* 137*  --   --  734*   Basic Metabolic Panel: Recent Labs  Lab 05/14/20 0429 05/15/20 0327 05/16/20 0523 05/16/20 1300 05/17/20 0415 05/18/20 1937 05/18/20 0541 05/19/20 0224 05/20/20 9024  NA 140   < > 139  --  140 139 141 139 142  K 4.8   < > 5.2*   < > 5.5* 5.2* 5.2* 4.9 4.6  CL 103   < > 102  --  103 103 104 102 102  CO2 28   < > 27  --  _0 GLUCOSE 110*   < > 108*  --  115* 112* 128* 116* 117*  BUN 174*   < > 191*  --  209* 219* 217* 224* 233*  CREATININE 2.10*   < > 2.11*  --  2.26* 2.37* 2.28* 2.33* 2.38*  CALCIUM 8.8*   < > 8.7*  --  8.7* 8.9 9.0 9.0 9.2  MG 1.8  --  1.7  --   --   --   --  2.0  --   PHOS 7.9*   < > 8.2*  --  8.7* 9.4*  --  9.4* 9.6*   < > = values in this interval not displayed.   GFR: Estimated Creatinine Clearance: 47.8 mL/min (A) (by C-G formula based on SCr of 2.38 mg/dL (H)). Liver Function Tests: Recent Labs  Lab 05/16/20 0523 05/17/20 0415 05/18/20 0218 05/19/20 0224 05/20/20 0511  ALBUMIN 1.8* 1.8* 1.9* 2.2* 2.3*   No results for input(s): LIPASE, AMYLASE in the last 168 hours. No results for input(s): AMMONIA in the last 168 hours. Coagulation Profile: No results for input(s): INR, PROTIME in the last 168 hours. Cardiac Enzymes: No results for input(s): CKTOTAL, CKMB, CKMBINDEX, TROPONINI in the last 168 hours. BNP (last 3 results) No results for input(s): PROBNP in the last 8760 hours. HbA1C: No results for input(s): HGBA1C in the last 72 hours. CBG: Recent Labs  Lab 05/19/20 1544  05/19/20 1942 05/19/20 2330 05/20/20 0332 05/20/20 0733  GLUCAP 114* 105* 116* 105* 109*   Lipid Profile: No results for input(s): CHOL, HDL, LDLCALC, TRIG, CHOLHDL, LDLDIRECT in the last 72 hours. Thyroid Function Tests: No results for input(s): TSH, T4TOTAL, FREET4, T3FREE, THYROIDAB in the last 72 hours. Anemia Panel: No results for input(s): VITAMINB12, FOLATE, FERRITIN, TIBC, IRON, RETICCTPCT in the last 72 hours. Urine analysis:    Component Value Date/Time   COLORURINE AMBER (A) 05/04/2020 1100   APPEARANCEUR TURBID (A) 05/04/2020 1100   LABSPEC 1.018 05/04/2020 1100   PHURINE 5.0 05/04/2020 1100   GLUCOSEU NEGATIVE 05/04/2020 1100   HGBUR LARGE (A) 05/04/2020 1100   BILIRUBINUR NEGATIVE 05/04/2020 1100   KETONESUR NEGATIVE 05/04/2020 1100   PROTEINUR 100 (A) 05/04/2020 1100   NITRITE NEGATIVE 05/04/2020 1100   LEUKOCYTESUR LARGE (A) 05/04/2020 1100     Alonnie Bieker M.D. [facility] 05/20/2020, 11:11 AM  Available via Epic secure chat 7am-7pm After 7 pm, please refer to night coverage provider listed on amion.

## 2020-05-20 NOTE — Progress Notes (Signed)
CSW spoke with Prem at [facility] - patient can return once a bed becomes available and he is medically stable for discharge.  Edwin Dada, MSW, LCSW Transitions of Care  Clinical Social Worker II [phone]

## 2020-05-20 NOTE — Progress Notes (Addendum)
Patient refused wound care for his BID dressing changes. Patient states "I want to wait for my wife to get here." This RN educated the patient on the importance and reasoning behind changing these dressings twice a day. Patient acknowledged understanding of education and continues to refuse wound care. Will continue to monitor.   Barbaraann Cao, RN

## 2020-05-20 NOTE — Progress Notes (Signed)
NAME:  Phillip Klein, MRN:  [id number], DOB:  [birth year], LOS: 21 ADMISSION DATE:  04/10/2020, CONSULTATION DATE:  2/27 REFERRING MD:  Silverio Lay, CHIEF COMPLAINT:  Sepsis   Brief History:  55 yo male who presented from [facility]. Admitted 2/24 with septic shock, ESBL [name]. Hickman catheter removed with concern for source of infection. Oliguric AKI , with baseline creatinine 1.8.   Past Medical History:  Chronic hypoxic respiratory failure/ tstomy/vent since 2021 after c spine surgery Hx of MRSA GERD Type 2 diabetes Anemia of chronic illness Osteomyelitis of cervical spine  Quadriplegia Spinal epidural abscess C2-C7 and T9-L4 Malnutrition Neurogenic astrostatic hypotension  HFrEF Untreated HCV Previous IVDU Cirrhosis  Chronic pain  Significant Hospital Events:  2/24 Admit with shock in the setting of klebsiella bacteremia  3/02 albumin 3/03 lasix 3/05 trial of SIMV/PS 3/14 Full support PEEP 10, 40% HGB 6.9 (TRH Mgmt) > diuresed / no transfusion (hemodilution) 3/17 On vent, PEEP 8 / 40%  Consults:  CCM ID Renal 3/2  Procedures:  Trach PTA >>  Hickman catheter PTA >> removed 2/26  Significant Diagnostic Tests:    Micro Data:  Blood 2/24  >> [name] ESBL 1/4 bottle Urine  2/26 >> multiple species   Antimicrobials:  Cefepime 2/24 >> 2/25 Vanc 2/24  >> 2/25  Mero 2/25  >>  3/9  Interim History / Subjective:  PEEP weaned to 8 / 40% Afebrile  No acute events  Care management notes noted > ok to go back to [facility] once medically cleared & bed available Pt reports mild pain, indicates he feels "bad" today   Objective   Blood pressure (!) 154/87, pulse 69, temperature 97.8 F (36.6 C), temperature source Oral, resp. rate 18, height 6\' 2"  (1.88 m), weight 109.2 kg, SpO2 98 %.    Vent Mode: PRVC FiO2 (%):  [40 %] 40 % Set Rate:  [18 bmp-20 bmp] 18 bmp Vt Set:  [630 mL] 630 mL PEEP:  [8 cmH20] 8 cmH20 Plateau Pressure:  [22 cmH20-29 cmH20] 23 cmH20    Intake/Output Summary (Last 24 hours) at 05/20/2020 0956 Last data filed at 05/20/2020 0700 Gross per 24 hour  Intake 1670.91 ml  Output 1750 ml  Net -79.09 ml   Filed Weights   05/17/20 0414 05/18/20 0331 05/19/20 0358  Weight: 114.4 kg 110 kg 109.2 kg    Examination: General: chronically ill appearing adult male lying in bed in NAD on vent  HEENT: MM pink/moist, #7 distal XLT trach midline c/d/i Neuro: Awake, alert, communicates by mouthing words CV: s1s2 RRR, NSR, BP control improved, no m/r/g PULM: non-labored on vent, lungs bilaterally clear GI: soft, bsx4 active, abd dressings intact / PEG c/d/i Extremities: warm/dry, anasarca   Skin: no rashes or lesions  Resolved Hospital Problem list   Septic shock  Assessment & Plan:   Chronic Hypoxemic Respiratory Failure in the Setting of Quadriplegia with Tracheostomy / Vent Dependent Chronic respiratory failure unlikely unable to be weaned from mechanical ventilation. -PRVC Vt 630, Rate 20, PEEP 8, FiO2 40% -trach care per protocol -follow intermittent CXR -strict NPO given overall goals of care -ensure bowel regimen  -volume status per Nephrology  All Other Issues Below per TRH  [name] bacteremia from UTI vs line infection Hickman catheter removed  Pacemaker in Place AKI likely ATN in setting of septic shock Anasarca Jejunostomy tube drainage> improved Hypotension likely related to dysautonomia - off midodrine Chronic pain Depression, insomnia Anemia of chronic disease + ABLA with Oozing from Wounds /  Uremic Platelet Dysfunction   [facility] - PCCM will continue to f/u 1-2x/week while in hospital   Best practice (evaluated daily)  Diet: tube feeding Pain/Anxiety/Delirium protocol (if indicated): Fentanyl patch VAP protocol (if indicated): yes DVT prophylaxis: sub q heparin GI prophylaxis: n/a Glucose control: SSI Mobility: bed rest Disposition: Per TRH, to vent SNF when bed available  Goals of Care:   Last date of multidisciplinary goals of care discussion:3/3  Family and staff present: wife, bedside nurse, patient, [name] Summary of discussion: "keep me alive at all costs" Follow up goals of care discussion due: 3/10   Code Status: full code, further discussion per Palliative Care / TRH  Labs   CBC: Recent Labs  Lab 05/17/20 1003 05/17/20 1619 05/18/20 0218 05/19/20 0224 05/20/20 0607  WBC 6.7 8.0  --   --  5.1  HGB 6.9* 7.7* 7.2* 6.9* 7.2*  HCT 21.8* 24.6* 23.2* 22.3* 23.4*  MCV 91.2 91.8  --   --  91.8  PLT 132* 137*  --   --  121*    Basic Metabolic Panel: Recent Labs  Lab 05/14/20 0429 05/15/20 0327 05/16/20 0523 05/16/20 1300 05/17/20 0415 05/18/20 0218 05/18/20 0541 05/19/20 0224 05/20/20 0511  NA 140   < > 139  --  140 139 141 139 142  K 4.8   < > 5.2*   < > 5.5* 5.2* 5.2* 4.9 4.6  CL 103   < > 102  --  103 103 104 102 102  CO2 28   < > 27  --  26 25 27 25 25   GLUCOSE 110*   < > 108*  --  115* 112* 128* 116* 117*  BUN 174*   < > 191*  --  209* 219* 217* 224* 233*  CREATININE 2.10*   < > 2.11*  --  2.26* 2.37* 2.28* 2.33* 2.38*  CALCIUM 8.8*   < > 8.7*  --  8.7* 8.9 9.0 9.0 9.2  MG 1.8  --  1.7  --   --   --   --  2.0  --   PHOS 7.9*   < > 8.2*  --  8.7* 9.4*  --  9.4* 9.6*   < > = values in this interval not displayed.   GFR: Estimated Creatinine Clearance: 47.8 mL/min (A) (by C-G formula based on SCr of 2.38 mg/dL (H)). Recent Labs  Lab 05/17/20 1003 05/17/20 1619 05/20/20 0607  WBC 6.7 8.0 5.1    Liver Function Tests: Recent Labs  Lab 05/16/20 0523 05/17/20 0415 05/18/20 0218 05/19/20 0224 05/20/20 0511  ALBUMIN 1.8* 1.8* 1.9* 2.2* 2.3*   No results for input(s): LIPASE, AMYLASE in the last 168 hours. No results for input(s): AMMONIA in the last 168 hours.  ABG    Component Value Date/Time   PHART 7.493 (H) 05/11/2020 2048   PCO2ART 32.3 05/11/2020 2048   PO2ART 100 05/11/2020 2048   HCO3 24.9 05/11/2020 2048   TCO2 26  05/11/2020 2048   ACIDBASEDEF 7.0 (H) 2020/05/16 1639   O2SAT 98.0 05/11/2020 2048     Coagulation Profile: No results for input(s): INR, PROTIME in the last 168 hours.  Cardiac Enzymes: No results for input(s): CKTOTAL, CKMB, CKMBINDEX, TROPONINI in the last 168 hours.  HbA1C: Hgb A1c MFr Bld  Date/Time Value Ref Range Status  05/16/20 10:00 PM 5.1 4.8 - 5.6 % Final    Comment:    (NOTE) Pre diabetes:  5.7%-6.4%  Diabetes:              >6.4%  Glycemic control for   <7.0% adults with diabetes     CBG: Recent Labs  Lab 05/19/20 1544 05/19/20 1942 05/19/20 2330 05/20/20 0332 05/20/20 0733  GLUCAP 114* 105* 116* 105* 109*     Canary Brim, MSN, APRN, NP-C, AGACNP-BC [facility] 05/20/2020, 9:56 AM   Please see [email or web address] for pager details.   From 7A-7P if no response, please call [phone] After hours, please call ELink [phone]

## 2020-05-20 NOTE — Progress Notes (Signed)
Patient ID: Phillip Klein, male   DOB: 01/07/1966, 55 y.o.   MRN: [id number] [facility] Progress Note   Assessment/ Plan:   1. Acute kidney Injury: Secondary to ischemic ATN from pressor requiring septic shock (with background history of chronic hypotension).  Renal function unchanged with stable creatinine but rising BUN.  He is not a candidate for long-term dialysis and time-limited short-term dialysis may be an option for management of severe azotemia.  Given the presence of continued bleeding likely exacerbated by uremic platelet dysfunction, would favor trial of dialysis for clearance. 2.  Anemia with intermittent bleeding from wounds: Secondary to platelet dysfunction in the setting of severe azotemia-started yesterday on conjugated estrogens 2.5 mg daily via tube x 5 days (this is on the lower spectrum of dosing will increase to 5 mg daily today). 3.  Sepsis secondary to [name] bacteremia: Status post completion of antibiotic course with meropenem and removal of Hickman catheter thought to be source of infection.  Sepsis markers have improved. 4.  Severe protein calorie malnutrition: Ongoing supplemental nutrition/tube feeds. 5.  History of diastolic heart failure: He has significant anasarca in part from his protein calorie malnutrition/low albumin and associated third spacing with preceding use of Florinef.  Albumin/furosemide unsuccessful-prescribed furosemide 120 mg 3 times a day starting today. 6.  Chronic ventilator dependent respiratory failure, quadriplegia and multiple pressure wounds/ulcers: With ongoing management for chronic pain/blood loss anemia and awaiting transfer to LTAC.  Subjective:   Refusing wound care along with other aspects of care overnight.  Limited interaction today.  Objective:   BP (!) 153/89   Pulse 71   Temp 97.8 F (36.6 C) (Oral)   Resp 18   Ht '6\' 2"'  (1.88 m)   Wt 109.2 kg   SpO2 98%   BMI 30.91 kg/m   Intake/Output Summary  (Last 24 hours) at 05/20/2020 9244 Last data filed at 05/20/2020 0600 Gross per 24 hour  Intake 2150.08 ml  Output 2075 ml  Net 75.08 ml   Weight change:   Physical Exam: Gen: Comfortably resting on ventilator via tracheostomy CVS: Pulse regular rhythm, normal rate, S1 and S2 normal Resp: Anteriorly clear to auscultation without any rales or rhonchi Abd: Soft, obese, PEG tube in situ Ext: 2-3+ anasarca with scrotal edema  Imaging: No results found.  Labs: BMET Recent Labs  Lab 05/14/20 0429 05/15/20 0327 05/16/20 0523 05/16/20 1300 05/17/20 0415 05/18/20 0218 05/18/20 0541 05/19/20 0224 05/20/20 0511  NA 140 139 139  --  140 139 141 139 142  K 4.8 4.9 5.2* 5.6* 5.5* 5.2* 5.2* 4.9 4.6  CL 103 103 102  --  103 103 104 102 102  CO2 '28 26 27  ' --  '26 25 27 25 25  ' GLUCOSE 110* 120* 108*  --  115* 112* 128* 116* 117*  BUN 174* 182* 191*  --  209* 219* 217* 224* 233*  CREATININE 2.10* 2.08* 2.11*  --  2.26* 2.37* 2.28* 2.33* 2.38*  CALCIUM 8.8* 8.9 8.7*  --  8.7* 8.9 9.0 9.0 9.2  PHOS 7.9* 7.8* 8.2*  --  8.7* 9.4*  --  9.4* 9.6*   CBC Recent Labs  Lab 05/17/20 1003 05/17/20 1619 05/18/20 0218 05/19/20 0224  WBC 6.7 8.0  --   --   HGB 6.9* 7.7* 7.2* 6.9*  HCT 21.8* 24.6* 23.2* 22.3*  MCV 91.2 91.8  --   --   PLT 132* 137*  --   --     Medications:    .  carvedilol  3.125 mg Per Tube BID WC  . chlorhexidine gluconate (MEDLINE KIT)  15 mL Mouth Rinse BID  . Chlorhexidine Gluconate Cloth  6 each Topical Daily  . clonazePAM  0.5 mg Per Tube TID  . darbepoetin (ARANESP) injection - NON-DIALYSIS  60 mcg Subcutaneous Q Mon-1800  . estrogens (conjugated)  2.5 mg Oral Daily  . feeding supplement (PROSource TF)  45 mL Per Tube 5 X Daily  . fentaNYL  1 patch Transdermal Q72H   And  . fentaNYL  1 patch Transdermal Q72H  . fluconazole  50 mg Oral Daily  . levothyroxine  125 mcg Per Tube Q0600  . mouth rinse  15 mL Mouth Rinse 10 times per day  . multivitamin with  minerals  1 tablet Per Tube Daily  . nutrition supplement (JUVEN)  1 packet Per Tube BID BM  . pantoprazole sodium  40 mg Per Tube BID  . sertraline  100 mg Per Tube Daily  . sodium chloride flush  10-40 mL Intracatheter Q12H  . traZODone  100 mg Per Tube QHS   Elmarie Shiley, MD 05/20/2020, 6:52 AM

## 2020-05-20 NOTE — Progress Notes (Signed)
Daily Progress Note   Patient Name: Phillip Klein       Date: 05/20/2020 DOB: Jul 03, 1965  Age: 55 y.o. MRN#: [id number] Attending Physician: Mendel Corning, MD Primary Care Physician: [name], MD Admit Date: 04/26/2020  Reason for Consultation/Follow-up: Establishing goals of care  Subjective: Patient resting well. Received report from nursing- no major changes. His Cr is trending up- nephrology considering trial short term dialysis but patient would not be an outpatient dialysis candidate.  [place] for followup on our previous discussion regarding code status.  Phillip Klein visited yesterday. She made several attempts to confirm patient's desire for DNR with patient- however, he deflected the discussion with her and before he left he told her, "I don't know what I want".  Phillip Klein requested Palliative team continue discussion and she will support outcome of discussion.  We discussed the difficulty of chronic illness and how discussions about mortality can sometimes be easier with clinicians without family present.  We also discussed there may come a time when patient is unable to make his wishes known due to cognition.   ROS  Length of Stay: 21  Current Medications: Scheduled Meds:  . carvedilol  6.25 mg Per Tube BID WC  . chlorhexidine gluconate (MEDLINE KIT)  15 mL Mouth Rinse BID  . Chlorhexidine Gluconate Cloth  6 each Topical Daily  . clonazePAM  0.5 mg Per Tube TID  . darbepoetin (ARANESP) injection - NON-DIALYSIS  60 mcg Subcutaneous Q Mon-1800  . estrogens (conjugated)  5 mg Oral Daily  . feeding supplement (PROSource TF)  45 mL Per Tube 5 X Daily  . fentaNYL  1 patch Transdermal Q72H   And  . fentaNYL  1 patch Transdermal Q72H  . fluconazole  50 mg Oral Daily   . levothyroxine  125 mcg Per Tube Q0600  . mouth rinse  15 mL Mouth Rinse 10 times per day  . multivitamin with minerals  1 tablet Per Tube Daily  . nutrition supplement (JUVEN)  1 packet Per Tube BID BM  . pantoprazole sodium  40 mg Per Tube BID  . sertraline  100 mg Per Tube Daily  . sodium chloride flush  10-40 mL Intracatheter Q12H  . traZODone  100 mg Per Tube QHS    Continuous Infusions: . sodium chloride 10 mL/hr at 05/20/20  0700  . albumin human 12.5 g (05/20/20 1415)  . feeding supplement (NEPRO CARB STEADY) 1,000 mL (05/20/20 0512)  . furosemide 120 mg (05/20/20 0901)    PRN Meds: sodium chloride, acetaminophen, albuterol, docusate, fentaNYL (SUBLIMAZE) injection, hydrALAZINE, oxyCODONE, polyethylene glycol, prochlorperazine, sodium chloride flush  Physical Exam          Vital Signs: BP (!) 157/86   Pulse 70   Temp 97.7 F (36.5 C) (Oral)   Resp 18   Ht '6\' 2"'  (1.88 m)   Wt 109.2 kg   SpO2 97%   BMI 30.91 kg/m  SpO2: SpO2: 97 % O2 Device: O2 Device: Ventilator O2 Flow Rate: O2 Flow Rate (L/min): 40 L/min  Intake/output summary:   Intake/Output Summary (Last 24 hours) at 05/20/2020 1435 Last data filed at 05/20/2020 1300 Gross per 24 hour  Intake 1607.86 ml  Output 2035 ml  Net -427.14 ml   LBM: Last BM Date: 05/19/20 Baseline Weight: Weight: 107.5 kg Most recent weight: Weight: 109.2 kg       Palliative Assessment/Data: PPS: 10%     Patient Active Problem List   Diagnosis Date Noted  . Severe malnutrition ([place])   . Anasarca   . Acute renal failure superimposed on stage 3b chronic kidney disease ([place])   . Sinus arrest   . Pacemaker   . HFrEF (heart failure with reduced ejection fraction) ([place])   . Anemia   . Jejunostomy tube leak ([place])   . Pleural effusion   . Quadriplegia ([place]) 05/01/2020  . Gram-negative bacteremia 05/01/2020  . Infection due to ESBL-producing [name] pneumoniae 05/01/2020  . Chronic hepatitis C without hepatic coma  ([place]) 05/01/2020  . Encounter for feeding tube placement   . Hypotension   . Pressure injury of skin 04/30/2020  . Malnutrition of moderate degree 04/30/2020  . Septic shock ([place]) 04/13/2020    Palliative Care Assessment & Plan   Patient Profile:  55 y.o. male  with past medical history of recurrent sepsis from MRSA, quadraplegia resulting from surgery for abscess of the spine, trach/vent dependent, J-tube in place, abdominal wound, multiple pressure ulcers, DM, anemia of chronic illness, hepatitis C, cirrhosis, chronic pain, previously residing at [facility] admitted on 04/17/2020 with septic shock secondary to ESBL/[name] bacteremia.  Admission has been complicated by acute kidney injury which has recovered.  Palliative medicine consulted for goals of care as patient was refusing dressing changes and wound care and other care interventions.  Assessment/Recommendations/Plan   Continue current full scope/full code  PMT will discuss again with patient tomorrow if he is awake and able to discuss  Goals of Care and Additional Recommendations:  Limitations on Scope of Treatment: Full Scope Treatment  Code Status:  Full code  Prognosis:   Unable to determine  Discharge Planning:  To Be Determined  Care plan was discussed with Phillip Klein- pt HCPOA  Thank you for allowing the Palliative Medicine Team to assist in the care of this patient.   Total time: 46 mins  Greater than 50%  of this time was spent counseling and coordinating care related to the above assessment and plan.  Mariana Kaufman, AGNP-C Palliative Medicine   Please contact Palliative Medicine Team phone at [phone] for questions and concerns.

## 2020-05-21 DIAGNOSIS — Z7189 Other specified counseling: Secondary | ICD-10-CM | POA: Diagnosis not present

## 2020-05-21 DIAGNOSIS — N170 Acute kidney failure with tubular necrosis: Secondary | ICD-10-CM | POA: Diagnosis not present

## 2020-05-21 DIAGNOSIS — J9600 Acute respiratory failure, unspecified whether with hypoxia or hypercapnia: Secondary | ICD-10-CM | POA: Diagnosis not present

## 2020-05-21 DIAGNOSIS — A4159 Other Gram-negative sepsis: Secondary | ICD-10-CM | POA: Diagnosis not present

## 2020-05-21 DIAGNOSIS — R7881 Bacteremia: Secondary | ICD-10-CM | POA: Diagnosis not present

## 2020-05-21 DIAGNOSIS — I959 Hypotension, unspecified: Secondary | ICD-10-CM | POA: Diagnosis not present

## 2020-05-21 DIAGNOSIS — R4189 Other symptoms and signs involving cognitive functions and awareness: Secondary | ICD-10-CM

## 2020-05-21 DIAGNOSIS — R601 Generalized edema: Secondary | ICD-10-CM | POA: Diagnosis not present

## 2020-05-21 DIAGNOSIS — Z515 Encounter for palliative care: Secondary | ICD-10-CM | POA: Diagnosis not present

## 2020-05-21 LAB — RENAL FUNCTION PANEL
Albumin: 2.3 g/dL — ABNORMAL LOW (ref 3.5–5.0)
Anion gap: 15 (ref 5–15)
BUN: 255 mg/dL — ABNORMAL HIGH (ref 6–20)
CO2: 25 mmol/L (ref 22–32)
Calcium: 9.2 mg/dL (ref 8.9–10.3)
Chloride: 102 mmol/L (ref 98–111)
Creatinine, Ser: 2.45 mg/dL — ABNORMAL HIGH (ref 0.61–1.24)
GFR, Estimated: 31 mL/min — ABNORMAL LOW (ref >60)
Glucose, Bld: 116 mg/dL — ABNORMAL HIGH (ref 70–99)
Phosphorus: 9.8 mg/dL — ABNORMAL HIGH (ref 2.5–4.6)
Potassium: 4.6 mmol/L (ref 3.5–5.1)
Sodium: 142 mmol/L (ref 135–145)

## 2020-05-21 LAB — CBC
HCT: 21.6 % — ABNORMAL LOW (ref 39.0–52.0)
Hemoglobin: 6.7 g/dL — CL (ref 13.0–17.0)
MCH: 28.5 pg (ref 26.0–34.0)
MCHC: 31 g/dL (ref 30.0–36.0)
MCV: 91.9 fL (ref 80.0–100.0)
Platelets: 112 10*3/uL — ABNORMAL LOW (ref 150–400)
RBC: 2.35 MIL/uL — ABNORMAL LOW (ref 4.22–5.81)
RDW: 16.4 % — ABNORMAL HIGH (ref 11.5–15.5)
WBC: 4.3 10*3/uL (ref 4.0–10.5)
nRBC: 0 % (ref 0.0–0.2)

## 2020-05-21 LAB — HEMOGLOBIN AND HEMATOCRIT, BLOOD
HCT: 24.3 % — ABNORMAL LOW (ref 39.0–52.0)
Hemoglobin: 7.6 g/dL — ABNORMAL LOW (ref 13.0–17.0)

## 2020-05-21 LAB — GLUCOSE, CAPILLARY
Glucose-Capillary: 101 mg/dL — ABNORMAL HIGH (ref 70–99)
Glucose-Capillary: 88 mg/dL (ref 70–99)
Glucose-Capillary: 99 mg/dL (ref 70–99)
Glucose-Capillary: 95 mg/dL (ref 70–99)
Glucose-Capillary: 110 mg/dL — ABNORMAL HIGH (ref 70–99)

## 2020-05-21 LAB — PREPARE RBC (CROSSMATCH)

## 2020-05-21 MED ORDER — NITROGLYCERIN 1 MG/10 ML FOR IR/CATH LAB
INTRA_ARTERIAL | Status: AC
  Filled 2020-05-21: qty 10

## 2020-05-21 MED ORDER — SODIUM CHLORIDE 0.9% IV SOLUTION: Freq: Once | INTRAVENOUS | Status: AC

## 2020-05-21 MED ORDER — CLONAZEPAM 0.5 MG PO TBDP
0.5000 mg | ORAL_TABLET | Freq: Two times a day (BID) | ORAL | Status: DC
  Administered 2020-05-21 – 2020-06-03 (×25): 0.5 mg
  Filled 2020-05-21 (×26): qty 1

## 2020-05-21 NOTE — Progress Notes (Signed)
Daily Progress Note   Patient Name: Phillip Klein       Date: 05/21/2020 DOB: Feb 19, 1966  Age: 55 y.o. MRN#: [id number] Attending Physician: Mendel Corning, MD Primary Care Physician: [name], MD Admit Date: 04/14/2020  Reason for Consultation/Follow-up: Establishing goals of care  Subjective: Met with patient. He was awake. Attempted to assess capacity for decision making.  He is oriented to person place and year. However, he does not recall our previous discussion. He does not recall his discussion with his spouse yesterday.  He is unable to verbalize any of his health issues or why he is in the hospital. After asking permission from him- I updated him on his current health status and medical issues. I then asked him again to verbalize back to me what his health issues are and he said, "I don't know".  Phillip Klein is unable to retain information pertinent to his care decisions. He is not able to verbalize what decisions need to be made or what the options are and he cannot back up his reasoning for his decisions.  Therefore, at this time I do not think Phillip Klein has capacity for medical decision making- in this case specifically regarding his code statu- but I doubt he has capacity at this time for other decisions as well.  I discussed my assessment with Phillip Klein and the fact that since she is his designated HCPOA then she would be the main decision maker regarding his care.  Phillip Klein is reluctant to assume this role. She wishes to see Phillip Klein tomorrow herself and attempt to discern if he was purposefully not repeating medical information- or if she believes that he truly has decline in his cognitive function.  I discussed with Phillip Klein - that even if patient purposefully does not participate in medical  decision making that in itself is an indication of not having capacity for medical decision making.  As far as dialysis- Phillip Klein is in agreement for short term dialysis and understands that he would not be long term dialysis candidate. She is not willing at this point to make decision regarding code status.   Review of Systems  Unable to perform ROS: Mental acuity    Length of Stay: 22  Current Medications: Scheduled Meds:  . carvedilol  6.25 mg Per Tube BID WC  . chlorhexidine gluconate (MEDLINE  KIT)  15 mL Mouth Rinse BID  . Chlorhexidine Gluconate Cloth  6 each Topical Daily  . clonazePAM  0.5 mg Per Tube BID  . darbepoetin (ARANESP) injection - NON-DIALYSIS  60 mcg Subcutaneous Q Mon-1800  . estrogens (conjugated)  5 mg Oral Daily  . feeding supplement (PROSource TF)  45 mL Per Tube 5 X Daily  . fentaNYL  1 patch Transdermal Q72H   And  . fentaNYL  1 patch Transdermal Q72H  . fluconazole  50 mg Oral Daily  . levothyroxine  125 mcg Per Tube Q0600  . mouth rinse  15 mL Mouth Rinse 10 times per day  . multivitamin with minerals  1 tablet Per Tube Daily  . nutrition supplement (JUVEN)  1 packet Per Tube BID BM  . pantoprazole sodium  40 mg Per Tube BID  . sertraline  100 mg Per Tube Daily  . sodium chloride flush  10-40 mL Intracatheter Q12H  . traZODone  100 mg Per Tube QHS    Continuous Infusions: . sodium chloride 10 mL/hr at 05/21/20 0600  . feeding supplement (NEPRO CARB STEADY) 1,000 mL (05/21/20 0800)  . furosemide 120 mg (05/21/20 0618)    PRN Meds: sodium chloride, acetaminophen, albuterol, docusate, fentaNYL (SUBLIMAZE) injection, hydrALAZINE, oxyCODONE, polyethylene glycol, prochlorperazine, sodium chloride flush  Physical Exam Vitals and nursing note reviewed.  HENT:     Mouth/Throat:     Mouth: Mucous membranes are dry.  Cardiovascular:     Comments: Diffuse anasarca            Vital Signs: BP 135/77   Pulse 81   Temp 99.4 Klein (37.4 C) (Axillary)   Resp  18   Ht _0  (1.88 m)   Wt 108.9 kg   SpO2 97%   BMI 30.82 kg/m  SpO2: SpO2: 97 % O2 Device: O2 Device: Ventilator O2 Flow Rate: O2 Flow Rate (L/min): 40 L/min  Intake/output summary:   Intake/Output Summary (Last 24 hours) at 05/21/2020 1348 Last data filed at 05/21/2020 1138 Gross per 24 hour  Intake 1767.08 ml  Output 1850 ml  Net -82.92 ml   LBM: Last BM Date: 05/20/20 Baseline Weight: Weight: 107.5 kg Most recent weight: Weight: 108.9 kg       Palliative Assessment/Data: PPS: 10%      Patient Active Problem List   Diagnosis Date Noted  . Severe malnutrition ([place])   . Anasarca   . Acute renal failure superimposed on stage 3b chronic kidney disease ([place])   . Sinus arrest   . Pacemaker   . HFrEF (heart failure with reduced ejection fraction) ([place])   . Anemia   . Jejunostomy tube leak ([place])   . Pleural effusion   . Quadriplegia ([place]) 05/01/2020  . Gram-negative bacteremia 05/01/2020  . Infection due to ESBL-producing [name] pneumoniae 05/01/2020  . Chronic hepatitis C without hepatic coma ([place]) 05/01/2020  . Encounter for feeding tube placement   . Hypotension   . Pressure injury of skin 04/30/2020  . Malnutrition of moderate degree 04/30/2020  . 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 Palliative medicine consulted for  goals of care as patient was refusing dressing changes and wound care and other care  interventions.  Assessment/Recommendations/Plan   Recommend all medical decision making and consents by Phillip Klein- patient does not appear to have capacity for medical decision making or consenting to treatments  HCPOA in agreement for trial of dialysis- she understands that he would not be a long term dialysis candidate  PMT will followup on Monday with HCPOA regarding code status  Goals of Care and Additional Recommendations:  Limitations on Scope of Treatment: Full Scope Treatment  Code Status:  Full code  Prognosis:   Unable to determine- likely less than 6 months even with continued aggressive care and full code status  Discharge Planning:  To Be Determined  Care plan was discussed with patient and HCPOA.   Thank you for allowing the Palliative Medicine Team to assist in the care of this patient.   Total time: 52 minutes   Greater than 50%  of this time was spent counseling and coordinating care related to the above assessment and plan.  Mariana Kaufman, AGNP-C Palliative Medicine   Please contact Palliative Medicine Team phone at [phone] for questions and concerns.

## 2020-05-21 NOTE — Progress Notes (Signed)
[facility] NEPHROLOGY PROGRESS NOTE  Assessment/ Plan:  # Acute kidney Injury: Secondary to ischemic ATN from pressor requiring septic shock (with background history of chronic hypotension).  Nonoliguric and creatinine level fairly stable.  However the BUN is going up. He is not a candidate for long-term dialysis and time-limited short-term dialysis may be an option for management of severe azotemia.  Palliative care team is following.  # Anemia with intermittent bleeding from wounds: Secondary to platelet dysfunction in the setting of severe azotemia.  Transfusing a unit of blood today.  # Sepsis secondary to [name] bacteremia: Status post completion of antibiotic course with meropenem and removal of Hickman catheter thought to be source of infection.  Sepsis markers have improved.  # Severe protein calorie malnutrition: Ongoing supplemental nutrition/tube feeds.  # History of diastolic heart failure: He has significant anasarca in part from his protein calorie malnutrition/low albumin and associated third spacing with preceding use of Florinef. Currently treating with Albumin/furosemide.  Recommend fluid restriction   # Chronic ventilator dependent respiratory failure, quadriplegia and multiple pressure wounds/ulcers: With ongoing management for chronic pain/blood loss anemia and awaiting transfer to LTAC.  Subjective: Seen and examined in ICU.  Patient is alert.  Urine output around 1.8 L.  The labs showed rising BUN level and low hemoglobin.  No major event. Objective Vital signs in last 24 hours: Vitals:   05/21/20 0324 05/21/20 0400 05/21/20 0500 05/21/20 0600  BP:  139/79 130/72 126/76  Pulse:  87 78 75  Resp:  '18 18 18  ' Temp: 98.1 F (36.7 C)     TempSrc: Oral     SpO2:  97% 97% 97%  Weight:   108.9 kg   Height:       Weight change:   Intake/Output Summary (Last 24 hours) at 05/21/2020 0804 Last data filed at 05/21/2020 0600 Gross per 24 hour  Intake  1710.08 ml  Output 2135 ml  Net -424.92 ml       Labs: Basic Metabolic Panel: Recent Labs  Lab 05/19/20 0224 05/20/20 0511 05/21/20 0452  NA 139 142 142  K 4.9 4.6 4.6  CL 102 102 102  CO2 '25 25 25  ' GLUCOSE 116* 117* 116*  BUN 224* 233* 255*  CREATININE 2.33* 2.38* 2.45*  CALCIUM 9.0 9.2 9.2  PHOS 9.4* 9.6* 9.8*   Liver Function Tests: Recent Labs  Lab 05/19/20 0224 05/20/20 0511 05/21/20 0452  ALBUMIN 2.2* 2.3* 2.3*   No results for input(s): LIPASE, AMYLASE in the last 168 hours. No results for input(s): AMMONIA in the last 168 hours. CBC: Recent Labs  Lab 05/17/20 1003 05/17/20 1619 05/18/20 0218 05/19/20 0224 05/20/20 0607 05/21/20 0452  WBC 6.7 8.0  --   --  5.1 4.3  HGB 6.9* 7.7*   < > 6.9* 7.2* 6.7*  HCT 21.8* 24.6*   < > 22.3* 23.4* 21.6*  MCV 91.2 91.8  --   --  91.8 91.9  PLT 132* 137*  --   --  121* 112*   < > = values in this interval not displayed.   Cardiac Enzymes: No results for input(s): CKTOTAL, CKMB, CKMBINDEX, TROPONINI in the last 168 hours. CBG: Recent Labs  Lab 05/20/20 1118 05/20/20 1531 05/20/20 1936 05/20/20 2331 05/21/20 0317  GLUCAP 100* 107* 115* 114* 101*    Iron Studies: No results for input(s): IRON, TIBC, TRANSFERRIN, FERRITIN in the last 72 hours. Studies/Results: No results found.  Medications: Infusions: . sodium chloride 10 mL/hr at  05/21/20 0600  . feeding supplement (NEPRO CARB STEADY) 55 mL/hr at 05/21/20 0000  . furosemide 120 mg (05/21/20 0618)    Scheduled Medications: . sodium chloride   Intravenous Once  . carvedilol  6.25 mg Per Tube BID WC  . chlorhexidine gluconate (MEDLINE KIT)  15 mL Mouth Rinse BID  . Chlorhexidine Gluconate Cloth  6 each Topical Daily  . clonazePAM  0.5 mg Per Tube TID  . darbepoetin (ARANESP) injection - NON-DIALYSIS  60 mcg Subcutaneous Q Mon-1800  . estrogens (conjugated)  5 mg Oral Daily  . feeding supplement (PROSource TF)  45 mL Per Tube 5 X Daily  . fentaNYL   1 patch Transdermal Q72H   And  . fentaNYL  1 patch Transdermal Q72H  . fluconazole  50 mg Oral Daily  . levothyroxine  125 mcg Per Tube Q0600  . mouth rinse  15 mL Mouth Rinse 10 times per day  . multivitamin with minerals  1 tablet Per Tube Daily  . nutrition supplement (JUVEN)  1 packet Per Tube BID BM  . pantoprazole sodium  40 mg Per Tube BID  . sertraline  100 mg Per Tube Daily  . sodium chloride flush  10-40 mL Intracatheter Q12H  . traZODone  100 mg Per Tube QHS    have reviewed scheduled and prn medications.  Physical Exam: General: Elderly male, ill looking lying on bed.  On vent via tracheostomy Heart:RRR, s1s2 nl Lungs: Basal decreased breath sound, clear anteriorly Abdomen:soft, Non-tender, non-distended Extremities: Upper and lower extremities edema/anasarca present Neurology: Alert, opening eyes.  Phillip Klein Phillip Klein 05/21/2020,8:04 AM  LOS: 22 days

## 2020-05-21 NOTE — Progress Notes (Signed)
[facility]                                                                              Patient Demographics  Phillip Klein, is a 55 y.o. male, DOB - May 18, 1965, IBB:[id number]  Admit date - 04/13/2020   Admitting Physician Lanier Clam, MD  Outpatient Primary MD for the patient is [name], MD  Outpatient specialists:   LOS - 22  days   Medical records reviewed and are as summarized below:    Chief Complaint  Patient presents with  . Hypotension       Brief summary   55 years old male with PMH significant for chronic hypoxic respiratory failure with tracheostomy and vent since 2021 after C-spine surgery, GERD, DM, anemia of chronic disease, quadriplegia, malnutrition, neurogenic orthostatic hypotension, untreated HCV, previous IV drug use, cirrhosis, chronic pain syndrome was sent from [facility] with septic shock.  Patient admitted in the ICU with septic shock secondary to ESBL/ [name] bacteremia.  Patient had Hickman's catheter which was removed,  presumed to be the source of infection.  Patient has completed meropenem for 10 days after removal of the catheter.  Patient is having acute kidney injury which is recovering, Cardiology, Nephrology is following.  PCCM is assisting with vent management.  Patient had multiple chronic bedsores, wound care is following.  Palliative care consulted to discuss goals of care.  Assessment & Plan    Principal Problem: Septic shock, present on admission secondary to [name] bacteremia, UTI versus line infection  -Hickman catheter presumed to be the source of infection, removed. -Blood cultures + [name] bacteremia, UA positive, culture showed multiple species -Completed meropenem for 10 days on 05/12/2020, repeat blood cultures negative -PICC line inserted on 3/8, venous Doppler showed no DVT. -Sepsis physiology resolved  Active problems Acute kidney injury secondary to ischemic ATN,  septic shock requiring pressors, chronic hypotension, anasarca -Patient was placed on albumin and Lasix for anasarca, per nephrology has not been effective, continue Lasix -Nephrology following, not a candidate for long-term dialysis, showed however may be an option for management of severe azotemia. -Palliative medicine also following.  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 BP stable  Chronic pain syndrome -Currently stable, continue fentanyl patch, oxycodone  Depression, insomnia Continue sertraline, trazodone, clonazepam  GERD Continue PPI  Left arm swelling -Venous Dopplers negative for DVT  Hematuria -resolving, urology was consulted, no need for any acute intervention  Multiple pressure wounds, present on admission -Full-thickness vertebral column stage IV -Sacrum stage IV, -Right and left ischial tuberosity, stage IV -Right lateral hip,  stage IV -Right and left lower leg lateral, unstageable - Right (unstageable now) and left posterior elbow, stage III - left posterior lower arm, unstageable    Obesity Estimated body mass index is 30.82 kg/m as calculated from the following:   Height as of this encounter: '6\' 2"'  (1.88 m).   Weight as of this encounter: 108.9 kg.  Code Status:  Full  DVT Prophylaxis:  SCDs Start: 04/21/2020 1811   Level of Care: Level of care: ICU Family Communication: Discussed all imaging results, lab results, explained to the patient  Disposition Plan:     Status is: Inpatient  Remains inpatient appropriate because:Inpatient level of care appropriate due to severity of illness   Dispo: The patient is from: SNF              Anticipated d/c is to: LTAC              Patient currently is not medically stable to d/c. Transfer back to [facility] or select pending bed availability and cleared by nephrology   Difficult to place patient No   Time Spent in minutes   19mns   Procedures:    Consultants:   Critical care Nephrology Palliative medicine Cardiology  Antimicrobials:   Anti-infectives (From admission, onward)   Start     Dose/Rate Route Frequency Ordered Stop   05/19/20 1000  fluconazole (DIFLUCAN) tablet 50 mg       "Followed by" Linked Group Details   50 mg Oral Daily 05/18/20 1048 05/28/20 0959   05/18/20 1145  fluconazole (DIFLUCAN) tablet 100 mg       "Followed by" Linked Group Details   100 mg Oral  Once 05/18/20 1048 05/18/20 1145   05/11/20 0800  meropenem (MERREM) 1 g in sodium chloride 0.9 % 100 mL IVPB        1 g 200 mL/hr over 30 Minutes Intravenous Every 8 hours 05/11/20 0707 05/12/20 1555   05/11/20 0715  meropenem (MERREM) 1 g in sodium chloride 0.9 % 100 mL IVPB  Status:  Discontinued        1 g 200 mL/hr over 30 Minutes Intravenous Every 8 hours 05/11/20 0706 05/11/20 0707   05/03/20 2200  meropenem (MERREM) 1 g in sodium chloride 0.9 % 100 mL IVPB  Status:   Discontinued        1 g 200 mL/hr over 30 Minutes Intravenous Every 12 hours 05/03/20 1041 05/11/20 0706   04/30/20 1145  meropenem (MERREM) 2 g in sodium chloride 0.9 % 100 mL IVPB  Status:  Discontinued        2 g 200 mL/hr over 30 Minutes Intravenous Every 12 hours 04/30/20 1052 05/03/20 1041   04/30/20 0530  ceFEPIme (MAXIPIME) 2 g in sodium chloride 0.9 % 100 mL IVPB  Status:  Discontinued        2 g 200 mL/hr over 30 Minutes Intravenous Every 12 hours 05/01/2020 1852 04/30/20 1051   04/15/2020 1852  vancomycin variable dose per unstable renal function (pharmacist dosing)  Status:  Discontinued         Does not apply See admin instructions 04/10/2020 1852 04/30/20 1051   04/23/2020 1630  vancomycin (VANCOREADY) IVPB 2000  mg/400 mL        2,000 mg 200 mL/hr over 120 Minutes Intravenous  Once 04/25/2020 1621 04/21/2020 2008   04/11/2020 1630  ceFEPIme (MAXIPIME) 2 g in sodium chloride 0.9 % 100 mL IVPB        2 g 200 mL/hr over 30 Minutes Intravenous  Once 04/08/2020 1621 04/15/2020 1749         Medications  Scheduled Meds: . carvedilol  6.25 mg Per Tube BID WC  . chlorhexidine gluconate (MEDLINE KIT)  15 mL Mouth Rinse BID  . Chlorhexidine Gluconate Cloth  6 each Topical Daily  . clonazePAM  0.5 mg Per Tube BID  . darbepoetin (ARANESP) injection - NON-DIALYSIS  60 mcg Subcutaneous Q Mon-1800  . estrogens (conjugated)  5 mg Oral Daily  . feeding supplement (PROSource TF)  45 mL Per Tube 5 X Daily  . fentaNYL  1 patch Transdermal Q72H   And  . fentaNYL  1 patch Transdermal Q72H  . fluconazole  50 mg Oral Daily  . levothyroxine  125 mcg Per Tube Q0600  . mouth rinse  15 mL Mouth Rinse 10 times per day  . multivitamin with minerals  1 tablet Per Tube Daily  . nutrition supplement (JUVEN)  1 packet Per Tube BID BM  . pantoprazole sodium  40 mg Per Tube BID  . sertraline  100 mg Per Tube Daily  . sodium chloride flush  10-40 mL Intracatheter Q12H  . traZODone  100 mg Per Tube QHS    Continuous Infusions: . sodium chloride 10 mL/hr at 05/21/20 0600  . feeding supplement (NEPRO CARB STEADY) 1,000 mL (05/21/20 0800)  . furosemide 120 mg (05/21/20 0618)   PRN Meds:.sodium chloride, acetaminophen, albuterol, docusate, fentaNYL (SUBLIMAZE) injection, hydrALAZINE, oxyCODONE, polyethylene glycol, prochlorperazine, sodium chloride flush      Subjective:   Phillip Klein was seen and examined today.  No acute complaints.  Anasarca.  No fevers.  Hemoglobin down to 6.7 again, transfusion ongoing.   Objective:   Vitals:   05/21/20 0900 05/21/20 1000 05/21/20 1100 05/21/20 1144  BP: 136/76 132/77 135/77   Pulse: 77 79 81   Resp: '18 18 18   ' Temp:  99.8 F (37.7 C) 99.4 F (37.4 C)   TempSrc:  Axillary Axillary   SpO2:    97%  Weight:      Height:        Intake/Output Summary (Last 24 hours) at 05/21/2020 1221 Last data filed at 05/21/2020 1138 Gross per 24 hour  Intake 1822.08 ml  Output 1850 ml  Net -27.92 ml     Wt Readings from Last 3 Encounters:  05/21/20 108.9 kg   Physical Exam  General: Alert and awake, ill-appearing, + trach on vent  Cardiovascular: S1 S2 clear, RRR. No pedal edema b/l  Respiratory: CTAB, no wheezing, rales or rhonchi  Gastrointestinal: Soft, colostomy bag on the left, dressing intact  Ext: bilateral heel protectors, anasarca, pitting edema  Neuro: no new deficits  Musculoskeletal: No cyanosis, clubbing  Skin: Multiple pressure sores  Psych: alert and awake, flat affect    Data Reviewed:  I have personally reviewed following labs and imaging studies  Micro Results No results found for this or any previous visit (from the past 240 hour(s)).  Radiology Reports US RENAL  Result Date: 05/04/2020 CLINICAL DATA:  Renal failure. EXAM: RENAL / URINARY TRACT ULTRASOUND COMPLETE COMPARISON:  None. FINDINGS: Right Kidney: Renal measurements: 11.2 cm x 5.9 cm x 6.2 cm =  volume: 216.67 mL. Echogenicity within normal  limits. No mass or hydronephrosis visualized. Left Kidney: Renal measurements: 11.3 cm x 6.3 cm x 6.2 cm = volume: 231.19 mL. Echogenicity within normal limits. No mass or hydronephrosis visualized. Bladder: Not visualized. Other: None. IMPRESSION: Normal renal ultrasound. Electronically Signed   By: Virgina Norfolk M.D.   On: 05/04/2020 21:35   DG ABDOMEN PEG TUBE LOCATION  Result Date: 04/30/2020 CLINICAL DATA:  Jejunostomy tube leak EXAM: ABDOMEN - 1 VIEW COMPARISON:  Portable exam 1103 hours without priors for comparison FINDINGS: Contrast was injected through the indwelling tube and an image was obtained. Contrast opacifies what is likely the gastric lumen. No extravasation of contrast seen. Radiopaque material within transverse and descending colon question additional recent contrast administration. Lead less pacemaker projects over cardiac silhouette. Surgical clips in LEFT pelvis. IMPRESSION: Injected contrast material opacifies what appears to be stomach rather than jejunum, question gastrostomy tube not jejunostomy tube. No extravasated contrast identified. Additional apparent contrast material within transverse colon and descending colon, question additional recent contrast administration. Electronically Signed   By: Lavonia Dana M.D.   On: 04/30/2020 12:44   DG CHEST PORT 1 VIEW  Result Date: 05/12/2020 CLINICAL DATA:  Shortness of breath EXAM: PORTABLE CHEST 1 VIEW COMPARISON:  None. FINDINGS: Tracheostomy device is present.  Right PICC line tip overlies SVC. Persistent bilateral pleural effusions with bibasilar atelectasis. Left lung aeration is improved. Persistent mild perihilar edema. Similar cardiomediastinal contours. IMPRESSION: Persistent bilateral pleural effusions and bibasilar atelectasis. Persistent but improved probable pulmonary edema. Electronically Signed   By: Macy Mis M.D.   On: 05/12/2020 16:53   DG CHEST PORT 1 VIEW  Result Date: 05/11/2020 CLINICAL DATA:  Shortness  of breath, unable to elevate chin EXAM: PORTABLE CHEST 1 VIEW COMPARISON:  Radiograph 05/08/2020 FINDINGS: Tracheostomy tube tip terminates in the mid trachea. Right upper extremity PICC tip terminates near the superior cavoatrial junction. Telemetry leads overlie the chest. Lung volumes are diminished from comparison exam. Increasing hazy interstitial opacities with a patchy perihilar and basilar predominance and gradient density obscuring the hemidiaphragms likely reflecting worsening pulmonary edema with increasing layering bilateral effusions accentuated by low volumes. Prominence of the cardiomediastinal silhouette, poorly visualized due to overlying opacity. Chin overlies the lung apices. No acute osseous or soft tissue abnormality. IMPRESSION: Worsening pulmonary edema and bilateral effusions. Support devices as above. Electronically Signed   By: Lovena Le M.D.   On: 05/11/2020 20:36   DG Chest Port 1 View  Result Date: 05/08/2020 CLINICAL DATA:  Respiratory failure EXAM: PORTABLE CHEST 1 VIEW COMPARISON:  05/01/2020 FINDINGS: Tracheostomy is unchanged. Right internal jugular central venous catheter has been removed. Pulmonary insufflation is stable. Small bilateral pleural effusions persist with associated bibasilar compressive atelectasis. Mild cardiomegaly is stable. Trace interstitial pulmonary edema has developed. No pneumothorax. Implanted loop recorder and cervical fusion hardware again noted. IMPRESSION: Interval removal of right internal jugular central venous catheter. Stable small bilateral pleural effusions. Interval development of mild pulmonary edema, likely cardiogenic in nature. Electronically Signed   By: Fidela Salisbury MD   On: 05/08/2020 07:12   DG CHEST PORT 1 VIEW  Result Date: 05/01/2020 CLINICAL DATA:  55 year old male status post central line placement. EXAM: PORTABLE CHEST 1 VIEW COMPARISON:  04/30/2020 portable chest. FINDINGS: Portable AP upright view at 0549 hours. Right  subclavian versus IJ approach central line catheter configuration is unchanged from yesterday, the catheter tip continues across midline and terminates in the left innominate vein as before.  Stable tracheostomy. Partially visible posterior cervicothoracic junction spine fusion hardware. Small cardiac event recorder or superficial ICD. Stable cardiac size and mediastinal contours. Continued veiling and confluent bibasilar opacity. No pneumothorax. Pulmonary vascularity appears mildly improved. No overt edema in the upper lungs. IMPRESSION: 1. Unchanged right side central line with tip at the left innominate vein level. Recommend repositioning to avoid innominate vein sclerosis. 2. Decreased pulmonary vascular congestion. Otherwise stable ventilation with evidence of bilateral pleural effusions with lower lobe collapse or consolidation. Electronically Signed   By: Genevie Ann M.D.   On: 05/01/2020 06:27   DG CHEST PORT 1 VIEW  Result Date: 04/30/2020 CLINICAL DATA:  Pleural effusion EXAM: PORTABLE CHEST 1 VIEW COMPARISON:  04/30/2020 FINDINGS: Tracheostomy tube terminates in the mid trachea, 4.7 cm from the carina. Right IJ approach central venous catheter courses to the level of the SVC, redirected superiorly and likely terminating within the left brachiocephalic vein. Consider repositioning. Telemetry leads overlie the chest. Stable appearance of the chest with Veiling opacity in the mid to lower lungs with obscuration of the hemidiaphragms likely reflecting a combination of atelectasis and layering pleural effusion though underlying airspace disease is difficult to fully exclude. Stable cardiomediastinal contours. No acute osseous or soft tissue abnormality. Prior cervical fusion incompletely assessed on this exam. IMPRESSION: 1. Veiling opacity in the mid to lower lungs likely reflecting a combination of atelectasis and layering pleural effusion. Underlying airspace disease is difficult to fully exclude. 2. Right  IJ approach central venous catheter courses to the level of the SVC, redirected superiorly and likely terminating within the left brachiocephalic vein. Electronically Signed   By: Lovena Le M.D.   On: 04/30/2020 05:02   DG Chest Portable 1 View  Result Date: 04/30/2020 CLINICAL DATA:  Hypotension. EXAM: PORTABLE CHEST 1 VIEW COMPARISON:  None. FINDINGS: Dense retrocardiac opacity. Right basilar atelectasis. Possible small left pleural effusion. No visible pneumothorax. Central venous catheter tip projects left of midline, most likely in the left brachiocephalic vein. Tracheostomy tube tip projects 3.7 cm above the carina. Partially imaged cervical ACDF. Mild enlargement the cardiac silhouette. Suspected loop recorder projects over the cardiac silhouette. IMPRESSION: 1. Dense left retrocardiac opacity, concerning for left lower lobe collapse. Aspiration or pneumonia is not excluded. Recommend follow-up to resolution. 2. Possible small left pleural effusion. 3. Central venous catheter tip projects left of midline, most likely in the left brachiocephalic vein. Findings discussed with [place] via telephone at 4:35 PM. Electronically Signed   By: Margaretha Sheffield MD   On: 04/26/2020 16:38   DG Shoulder Left  Result Date: 05/12/2020 CLINICAL DATA:  Left shoulder pain and bruising. EXAM: LEFT SHOULDER - 2+ VIEW COMPARISON:  None. FINDINGS: There is no evidence of fracture or dislocation. There is no evidence of arthropathy or other focal bone abnormality. Soft tissues are unremarkable. IMPRESSION: Negative exam. Electronically Signed   By: Inge Rise M.D.   On: 05/12/2020 15:57   DG Abd Portable 1V  Addendum Date: 04/30/2020   ADDENDUM REPORT: 04/30/2020 15:30 ADDENDUM: This examination was subsequently discussed with Dr. Caron Presume. He reports that the patient has a GJ tube with a single skin entrance in the epigastric area. In light of this information, the tip of the tube is likely in the  descending duodenum as correlated with the earlier study. Contrast in the stomach may relate to the previous contrast injection or reflux. No tubing is seen within the jejunum. This could be further clarified under fluoroscopy or by CT as clinically  warranted. Electronically Signed   By: Richardean Sale M.D.   On: 04/30/2020 15:30   Result Date: 04/30/2020 CLINICAL DATA:  Injection of contrast into J-tube to confirm location EXAM: PORTABLE ABDOMEN - 1 VIEW COMPARISON:  Previous radiograph earlier the same date. FINDINGS: 1428 hours. The tube is unchanged from the earlier examination with the tip looped in the proximal stomach. Contrast has partially emptied from the stomach into the duodenum. Additional contrast is present in the transverse colon as noted previously. No contrast leak identified. Surgical clips are noted in the left pelvis. IMPRESSION: No significant change. The tip of the percutaneous tube is looped in the proximal stomach. Electronically Signed: By: Richardean Sale M.D. On: 04/30/2020 15:02   VAS [place] UPPER EXTREMITY VENOUS DUPLEX  Result Date: 05/11/2020 UPPER VENOUS STUDY  Indications: Swelling Comparison Study: no prior Performing Technologist: Abram Sander RVS  Examination Guidelines: A complete evaluation includes B-mode imaging, spectral Doppler, color Doppler, and power Doppler as needed of all accessible portions of each vessel. Bilateral testing is considered an integral part of a complete examination. Limited examinations for reoccurring indications may be performed as noted.  Left Findings: +----------+------------+---------+-----------+----------+--------------+ LEFT      CompressiblePhasicitySpontaneousProperties   Summary     +----------+------------+---------+-----------+----------+--------------+ IJV                                                 Not visualized +----------+------------+---------+-----------+----------+--------------+ Subclavian    Full       Yes        Yes                             +----------+------------+---------+-----------+----------+--------------+ Axillary      Full       Yes       Yes                             +----------+------------+---------+-----------+----------+--------------+ Brachial      Full       Yes       Yes                             +----------+------------+---------+-----------+----------+--------------+ Radial        Full                                                 +----------+------------+---------+-----------+----------+--------------+ Ulnar         Full                                                 +----------+------------+---------+-----------+----------+--------------+ Cephalic      None                                                 +----------+------------+---------+-----------+----------+--------------+ Basilic       Full                                                 +----------+------------+---------+-----------+----------+--------------+  Summary:  Left: No evidence of deep vein thrombosis in the upper extremity. Findings consistent with age indeterminate superficial vein thrombosis involving the left cephalic vein.  *See table(s) above for measurements and observations.  Diagnosing physician: Deitra Mayo MD Electronically signed by Deitra Mayo MD on 05/11/2020 at 5:02:39 PM.    Final    ECHOCARDIOGRAM LIMITED  Result Date: 05/14/2020    ECHOCARDIOGRAM LIMITED REPORT   Patient Name:   DOROTHY Herder Date of Exam: 05/14/2020 Medical Rec #:  [id number]        Height:       74.0 in Accession #:    [id number]       Weight:       251.1 lb Date of Birth:  August 23, 1965        BSA:          2.394 m Patient Age:    71 years         BP:           153/88 mmHg Patient Gender: M                HR:           83 bpm. Exam Location:  Inpatient Procedure: Limited Echo, Limited Color Doppler and Cardiac Doppler Indications:    CHF-Acute Diastolic O64.31  History:         Patient has prior history of Echocardiogram examinations, most                 recent 04/30/2020. Pacemaker; Risk Factors:Diabetes.  Sonographer:    Mikki Santee RDCS (AE) Referring Phys: [place]  1. Left ventricular ejection fraction, by estimation, is 55%. The left ventricle has normal function. Left ventricular diastolic parameters were normal.  2. Right ventricular systolic function is normal. The right ventricular size is moderately enlarged. A leadless pacemaker is visualized.  3. Trivial mitral valve regurgitation. FINDINGS  Left Ventricle: Left ventricular ejection fraction, by estimation, is 55%. The left ventricle has normal function. Left ventricular diastolic parameters were normal. Right Ventricle: The right ventricular size is moderately enlarged. Right ventricular systolic function is normal. Mitral Valve: Trivial mitral valve regurgitation. Additional Comments: A leadless pacemaker is visualized.  Diastology LV e' medial:    9.57 cm/s LV E/e' medial:  10.0 LV e' lateral:   14.90 cm/s LV E/e' lateral: 6.4  MITRAL VALVE MV Area (PHT): 3.77 cm MV Decel Time: 201 msec MV E velocity: 95.70 cm/s MV A velocity: 65.60 cm/s MV E/A ratio:  1.46 Cherlynn Kaiser MD Electronically signed by Cherlynn Kaiser MD Signature Date/Time: 05/14/2020/12:56:19 PM    Final    ECHOCARDIOGRAM LIMITED  Result Date: 04/30/2020    ECHOCARDIOGRAM LIMITED REPORT   Patient Name:   KYSHON Ladouceur Date of Exam: 04/30/2020 Medical Rec #:  [id number]        Height:       74.0 in Accession #:    [id number]       Weight:       229.9 lb Date of Birth:  [birth year]        BSA:          2.306 m Patient Age:    76 years         BP:           100/62 mmHg Patient Gender: M                HR:  80 bpm. Exam Location:  Inpatient Procedure: Limited Echo, Cardiac Doppler and Color Doppler Indications:    CHF  History:        Patient has no prior history of Echocardiogram examinations.                 CHF.  Septic shock, Resp, failure, IVDU, cirrhosis, quadriplegia.  Sonographer:    Dustin Flock Referring Phys: [phone] [place]  1. Left ventricular ejection fraction, by estimation, is 45 to 50%. The left ventricle has mildly decreased function. The left ventricle demonstrates global hypokinesis. Left ventricular diastolic parameters are consistent with Grade II diastolic dysfunction (pseudonormalization).  2. Right ventricular systolic function is moderately reduced.  3. The pericardial effusion is anterior to the right ventricle and circumferential. FINDINGS  Left Ventricle: Left ventricular ejection fraction, by estimation, is 45 to 50%. The left ventricle has mildly decreased function. The left ventricle demonstrates global hypokinesis. Left ventricular diastolic parameters are consistent with Grade II diastolic dysfunction (pseudonormalization). Right Ventricle: Limited echo for LV function. Right ventricular systolic function is moderately reduced. Pericardium: Trivial pericardial effusion is present. The pericardial effusion is anterior to the right ventricle and circumferential. Additional Comments: A pacer wire is visualized. LEFT VENTRICLE PLAX 2D LVIDd:         4.40 cm  Diastology LVIDs:         3.80 cm  LV e' medial:    4.57 cm/s LV PW:         1.10 cm  LV E/e' medial:  15.8 LV IVS:        1.10 cm  LV e' lateral:   4.35 cm/s LVOT diam:     2.80 cm  LV E/e' lateral: 16.6 LVOT Area:     6.16 cm  RIGHT VENTRICLE RV S prime:     7.40 cm/s LEFT ATRIUM         Index LA diam:    3.60 cm 1.56 cm/m   AORTA Ao Root diam: 3.60 cm MITRAL VALVE MV Area (PHT): 3.48 cm    SHUNTS MV Decel Time: 218 msec    Systemic Diam: 2.80 cm MV E velocity: 72.40 cm/s MV A velocity: 44.60 cm/s MV E/A ratio:  1.62 Candee Furbish MD Electronically signed by Candee Furbish MD Signature Date/Time: 04/30/2020/1:53:17 PM    Final    [place] EKG SITE RITE  Result Date: 05/11/2020 If Site Rite image not attached, placement could  not be confirmed due to current cardiac rhythm.  [place] EKG SITE RITE  Result Date: 05/01/2020 If [facility] image not attached, placement could not be confirmed due to current cardiac rhythm.   Lab Data:  CBC: Recent Labs  Lab 05/17/20 1003 05/17/20 1619 05/18/20 0218 05/19/20 0224 05/20/20 0607 05/21/20 0452  WBC 6.7 8.0  --   --  5.1 4.3  HGB 6.9* 7.7* 7.2* 6.9* 7.2* 6.7*  HCT 21.8* 24.6* 23.2* 22.3* 23.4* 21.6*  MCV 91.2 91.8  --   --  91.8 91.9  PLT 132* 137*  --   --  121* 102*   Basic Metabolic Panel: Recent Labs  Lab 05/16/20 0523 05/16/20 1300 05/17/20 0415 05/18/20 0218 05/18/20 0541 05/19/20 0224 05/20/20 0511 05/21/20 0452  NA 139  --  140 139 141 139 142 142  K 5.2*   < > 5.5* 5.2* 5.2* 4.9 4.6 4.6  CL 102  --  103 103 104 102 102 102  CO2 27  --  '26 25 27 ' 25  25 25  GLUCOSE 108*  --  115* 112* 128* 116* 117* 116*  BUN 191*  --  209* 219* 217* 224* 233* 255*  CREATININE 2.11*  --  2.26* 2.37* 2.28* 2.33* 2.38* 2.45*  CALCIUM 8.7*  --  8.7* 8.9 9.0 9.0 9.2 9.2  MG 1.7  --   --   --   --  2.0  --   --   PHOS 8.2*  --  8.7* 9.4*  --  9.4* 9.6* 9.8*   < > = values in this interval not displayed.   GFR: Estimated Creatinine Clearance: 46.3 mL/min (A) (by C-G formula based on SCr of 2.45 mg/dL (H)). Liver Function Tests: Recent Labs  Lab 05/17/20 0415 05/18/20 0218 05/19/20 0224 05/20/20 0511 05/21/20 0452  ALBUMIN 1.8* 1.9* 2.2* 2.3* 2.3*   No results for input(s): LIPASE, AMYLASE in the last 168 hours. No results for input(s): AMMONIA in the last 168 hours. Coagulation Profile: No results for input(s): INR, PROTIME in the last 168 hours. Cardiac Enzymes: No results for input(s): CKTOTAL, CKMB, CKMBINDEX, TROPONINI in the last 168 hours. BNP (last 3 results) No results for input(s): PROBNP in the last 8760 hours. HbA1C: No results for input(s): HGBA1C in the last 72 hours. CBG: Recent Labs  Lab 05/20/20 1118 05/20/20 1531 05/20/20 1936  05/20/20 2331 05/21/20 0317  GLUCAP 100* 107* 115* 114* 101*   Lipid Profile: No results for input(s): CHOL, HDL, LDLCALC, TRIG, CHOLHDL, LDLDIRECT in the last 72 hours. Thyroid Function Tests: No results for input(s): TSH, T4TOTAL, FREET4, T3FREE, THYROIDAB in the last 72 hours. Anemia Panel: No results for input(s): VITAMINB12, FOLATE, FERRITIN, TIBC, IRON, RETICCTPCT in the last 72 hours. Urine analysis:    Component Value Date/Time   COLORURINE AMBER (A) 05/04/2020 1100   APPEARANCEUR TURBID (A) 05/04/2020 1100   LABSPEC 1.018 05/04/2020 1100   PHURINE 5.0 05/04/2020 1100   GLUCOSEU NEGATIVE 05/04/2020 1100   HGBUR LARGE (A) 05/04/2020 1100   BILIRUBINUR NEGATIVE 05/04/2020 1100   KETONESUR NEGATIVE 05/04/2020 1100   PROTEINUR 100 (A) 05/04/2020 1100   NITRITE NEGATIVE 05/04/2020 1100   LEUKOCYTESUR LARGE (A) 05/04/2020 1100     Labron Bloodgood M.D. [facility] 05/21/2020, 12:21 PM  Available via Epic secure chat 7am-7pm After 7 pm, please refer to night coverage provider listed on amion.

## 2020-05-22 ENCOUNTER — Inpatient Hospital Stay (HOSPITAL_COMMUNITY): Payer: Medicare Other

## 2020-05-22 DIAGNOSIS — R7881 Bacteremia: Secondary | ICD-10-CM | POA: Diagnosis not present

## 2020-05-22 DIAGNOSIS — I959 Hypotension, unspecified: Secondary | ICD-10-CM | POA: Diagnosis not present

## 2020-05-22 DIAGNOSIS — A4159 Other Gram-negative sepsis: Secondary | ICD-10-CM | POA: Diagnosis not present

## 2020-05-22 HISTORY — PX: IR US GUIDE VASC ACCESS RIGHT: IMG2390

## 2020-05-22 HISTORY — PX: IR FLUORO GUIDE CV LINE RIGHT: IMG2283

## 2020-05-22 LAB — RENAL FUNCTION PANEL
Albumin: 2.4 g/dL — ABNORMAL LOW (ref 3.5–5.0)
Albumin: 2.3 g/dL — ABNORMAL LOW (ref 3.5–5.0)
Anion gap: 16 — ABNORMAL HIGH (ref 5–15)
Anion gap: 16 — ABNORMAL HIGH (ref 5–15)
BUN: 270 mg/dL — ABNORMAL HIGH (ref 6–20)
BUN: 275 mg/dL — ABNORMAL HIGH (ref 6–20)
CO2: 23 mmol/L (ref 22–32)
CO2: 22 mmol/L (ref 22–32)
Calcium: 8.9 mg/dL (ref 8.9–10.3)
Calcium: 8.9 mg/dL (ref 8.9–10.3)
Chloride: 103 mmol/L (ref 98–111)
Chloride: 101 mmol/L (ref 98–111)
Creatinine, Ser: 2.53 mg/dL — ABNORMAL HIGH (ref 0.61–1.24)
Creatinine, Ser: 2.7 mg/dL — ABNORMAL HIGH (ref 0.61–1.24)
GFR, Estimated: 30 mL/min — ABNORMAL LOW (ref >60)
GFR, Estimated: 27 mL/min — ABNORMAL LOW (ref >60)
Glucose, Bld: 112 mg/dL — ABNORMAL HIGH (ref 70–99)
Glucose, Bld: 148 mg/dL — ABNORMAL HIGH (ref 70–99)
Phosphorus: 10 mg/dL — ABNORMAL HIGH (ref 2.5–4.6)
Phosphorus: 10.2 mg/dL — ABNORMAL HIGH (ref 2.5–4.6)
Potassium: 4.3 mmol/L (ref 3.5–5.1)
Potassium: 4.5 mmol/L (ref 3.5–5.1)
Sodium: 142 mmol/L (ref 135–145)
Sodium: 139 mmol/L (ref 135–145)

## 2020-05-22 LAB — CBC
HCT: 25.2 % — ABNORMAL LOW (ref 39.0–52.0)
HCT: 25.4 % — ABNORMAL LOW (ref 39.0–52.0)
Hemoglobin: 8 g/dL — ABNORMAL LOW (ref 13.0–17.0)
Hemoglobin: 7.8 g/dL — ABNORMAL LOW (ref 13.0–17.0)
MCH: 28.6 pg (ref 26.0–34.0)
MCH: 28.2 pg (ref 26.0–34.0)
MCHC: 31.7 g/dL (ref 30.0–36.0)
MCHC: 30.7 g/dL (ref 30.0–36.0)
MCV: 90 fL (ref 80.0–100.0)
MCV: 91.7 fL (ref 80.0–100.0)
Platelets: 125 10*3/uL — ABNORMAL LOW (ref 150–400)
Platelets: 120 10*3/uL — ABNORMAL LOW (ref 150–400)
RBC: 2.8 MIL/uL — ABNORMAL LOW (ref 4.22–5.81)
RBC: 2.77 MIL/uL — ABNORMAL LOW (ref 4.22–5.81)
RDW: 16.2 % — ABNORMAL HIGH (ref 11.5–15.5)
RDW: 16.2 % — ABNORMAL HIGH (ref 11.5–15.5)
WBC: 6.2 10*3/uL (ref 4.0–10.5)
WBC: 5 10*3/uL (ref 4.0–10.5)
nRBC: 0 % (ref 0.0–0.2)
nRBC: 0 % (ref 0.0–0.2)

## 2020-05-22 LAB — TYPE AND SCREEN
ABO/RH(D): B POS
Antibody Screen: NEGATIVE
Unit division: 0
Unit division: 0

## 2020-05-22 LAB — GLUCOSE, CAPILLARY
Glucose-Capillary: 109 mg/dL — ABNORMAL HIGH (ref 70–99)
Glucose-Capillary: 112 mg/dL — ABNORMAL HIGH (ref 70–99)
Glucose-Capillary: 118 mg/dL — ABNORMAL HIGH (ref 70–99)
Glucose-Capillary: 120 mg/dL — ABNORMAL HIGH (ref 70–99)
Glucose-Capillary: 123 mg/dL — ABNORMAL HIGH (ref 70–99)
Glucose-Capillary: 126 mg/dL — ABNORMAL HIGH (ref 70–99)

## 2020-05-22 LAB — BPAM RBC
Blood Product Expiration Date: 202204132359
Blood Product Expiration Date: 202204132359
ISSUE DATE / TIME: 202203160503
ISSUE DATE / TIME: 202203180816
Unit Type and Rh: 7300
Unit Type and Rh: 7300

## 2020-05-22 MED ORDER — SODIUM BICARBONATE 650 MG PO TABS
650.0000 mg | ORAL_TABLET | Freq: Once | ORAL | Status: AC
  Administered 2020-05-22: 650 mg
  Filled 2020-05-22: qty 1

## 2020-05-22 MED ORDER — LIDOCAINE HCL (PF) 1 % IJ SOLN: 5.0000 mL | INTRAMUSCULAR | Status: DC | PRN

## 2020-05-22 MED ORDER — ALBUMIN HUMAN 25 % IV SOLN
25.0000 g | Freq: Four times a day (QID) | INTRAVENOUS | Status: AC
  Administered 2020-05-22 – 2020-05-24 (×8): 25 g via INTRAVENOUS
  Filled 2020-05-22 (×8): qty 100

## 2020-05-22 MED ORDER — HEPARIN SODIUM (PORCINE) 1000 UNIT/ML IJ SOLN
INTRAMUSCULAR | Status: AC
  Filled 2020-05-22: qty 1

## 2020-05-22 MED ORDER — LIDOCAINE HCL 1 % IJ SOLN
INTRAMUSCULAR | Status: AC | PRN
  Administered 2020-05-22: 5 mL

## 2020-05-22 MED ORDER — PENTAFLUOROPROP-TETRAFLUOROETH EX AERO: 1.0000 "application " | INHALATION_SPRAY | CUTANEOUS | Status: DC | PRN

## 2020-05-22 MED ORDER — LIDOCAINE HCL 1 % IJ SOLN
INTRAMUSCULAR | Status: AC
  Filled 2020-05-22: qty 20

## 2020-05-22 MED ORDER — SODIUM CHLORIDE 0.9 % IV SOLN: 100.0000 mL | INTRAVENOUS | Status: DC | PRN

## 2020-05-22 MED ORDER — FENTANYL CITRATE (PF) 100 MCG/2ML IJ SOLN
INTRAMUSCULAR | Status: AC
  Filled 2020-05-22: qty 2

## 2020-05-22 MED ORDER — SODIUM CHLORIDE 0.9 % IV SOLN
100.0000 mL | INTRAVENOUS | Status: DC | PRN
Start: 2020-05-22 — End: 2020-05-24

## 2020-05-22 MED ORDER — PANCRELIPASE (LIP-PROT-AMYL) 10440-39150 UNITS PO TABS
20880.0000 [IU] | ORAL_TABLET | Freq: Once | ORAL | Status: AC
  Administered 2020-05-22: 20880 [IU]
  Filled 2020-05-22: qty 2

## 2020-05-22 MED ORDER — HEPARIN SODIUM (PORCINE) 1000 UNIT/ML IJ SOLN
INTRAMUSCULAR | Status: AC | PRN
Start: 2020-05-22 — End: 2020-05-22
  Administered 2020-05-22: 2.6 mL via INTRAVENOUS

## 2020-05-22 MED ORDER — FENTANYL CITRATE (PF) 100 MCG/2ML IJ SOLN
100.0000 ug | Freq: Once | INTRAMUSCULAR | Status: AC
  Administered 2020-05-22: 100 ug via INTRAVENOUS

## 2020-05-22 MED ORDER — CHLORHEXIDINE GLUCONATE CLOTH 2 % EX PADS
6.0000 | MEDICATED_PAD | Freq: Every day | CUTANEOUS | Status: DC
  Administered 2020-05-22 – 2020-06-03 (×3): 6 via TOPICAL

## 2020-05-22 MED ORDER — ALTEPLASE 2 MG IJ SOLR: 2.0000 mg | Freq: Once | INTRAMUSCULAR | Status: DC | PRN

## 2020-05-22 MED ORDER — LIDOCAINE-PRILOCAINE 2.5-2.5 % EX CREA
1.0000 | TOPICAL_CREAM | CUTANEOUS | Status: DC | PRN
Start: 2020-05-22 — End: 2020-06-03

## 2020-05-22 MED ORDER — HEPARIN SODIUM (PORCINE) 1000 UNIT/ML DIALYSIS
1000.0000 [IU] | INTRAMUSCULAR | Status: DC | PRN
  Filled 2020-05-22 (×2): qty 1

## 2020-05-22 MED ORDER — MIDAZOLAM HCL 2 MG/2ML IJ SOLN: 2.0000 mg | Freq: Once | INTRAMUSCULAR | Status: DC

## 2020-05-22 NOTE — Progress Notes (Signed)
Pt transported from IR back to 66[facility] 14. No complications noted.

## 2020-05-22 NOTE — Progress Notes (Signed)
Interventional Radiology Progress Note   Patient arrives to [facility] for temp HD cath.   Nursing staff now makes [place] aware of complaint of percutaneous g-j tube malfunction.  Uncertain if this complaint is for the g-port, the j-port, or both.  Uncertain of the last time this was used successfully.  Uncertain of the surgical placement history.   Attempt made to flush the g port and the j port with saline, failed.   Attempt made to pass 035 wire through g port and j port, failed.   Likely will need image guided exchange. This will require discussion with family and patient with consent, for which we have not performed at this time.   Signed,  Yvone Neu. Loreta Ave, DO

## 2020-05-22 NOTE — H&P (Signed)
Chief Complaint: Acute kidney injury  Referring Physician(s): Ronalee Belts  Supervising Physician: Gilmer Mor  Patient Status: [facility] - In-pt  History of Present Illness: Phillip Klein is a 55 y.o. male with with medical issues including quadriplegia and chronic hypoxic respiratory failure requiring tracheostomy and vent support since 2021 after C-spine surgery.   He was sent to [facility] from [facility] in septic shock secondary to ESBL/[name] bacteremia.    He has developed acute kidney injury secondary to ischemic ATN from pressors.  We are asked to place a temporary hemodialysis catheter.  He is alert and can communicate.  His wife is at the bedside.  Past Medical History:  Diagnosis Date  . Anemia, chronic disease   . Diabetes mellitus type 2, insulin dependent (HCC)   . 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 GERD (gastroesophageal reflux disease)   . Hepatitis C   . PEA (Pulseless electrical activity) (HCC) 09/2019  . Respiratory failure with hypoxia (HCC) 2021  . Sinus bradycardia with pauses 09/2019   s/p MDT MICRA leadless PPM    Past Surgical History:  Procedure Laterality Date  . CERVICAL SPINE SURGERY  2021  . PACEMAKER LEADLESS INSERTION  09/2019   MDT MIRCA leadless PPM  . TRACHEOSTOMY      Allergies: Acetaminophen and Gabapentin  Medications: Prior to Admission medications   Medication Sig Start Date End Date Taking? Authorizing Provider  acetaminophen (TYLENOL) 325 MG tablet Place 650 mg into feeding tube every 6 (six) hours as needed for mild pain.   Yes [provider]  alum & mag hydroxide-simeth (MAG-AL PLUS) 200-200-20 MG/5ML suspension Place 30 mLs into feeding tube every 6 (six) hours as needed (GERD WITH ESOPHAGITIS WITHOUT BLEEDING).   Yes [provider]  amiodarone (PACERONE) 200 MG tablet Place 200 mg into feeding tube in the morning.   Yes [provider]  amLODipine (NORVASC) 5 MG  tablet Place 5 mg into feeding tube daily.   Yes [provider]  chlorhexidine (PERIDEX) 0.12 % solution 15 mLs by Mouth Rinse route 2 (two) times daily.   Yes [provider]  clonazePAM (KLONOPIN) 0.5 MG tablet Place 0.5 mg into feeding tube every 6 (six) hours as needed (for generalized anxiety disorder).   Yes [provider]  fentaNYL (DURAGESIC) 100 MCG/HR Place 1 patch onto the skin every 3 (three) days.   Yes [provider]  fentaNYL (DURAGESIC) 50 MCG/HR Place 1 patch onto the skin every 3 (three) days.   Yes [provider]  ferrous sulfate 220 (44 Fe) MG/5ML solution Place 220 mg into feeding tube in the morning and at bedtime.   Yes [provider]  fludrocortisone (FLORINEF) 0.1 MG tablet 0.2 mg See admin instructions. 0.2 mg, per G-Tube, every twelve hours   Yes [provider]  furosemide (LASIX) 40 MG tablet Place 40 mg into feeding tube in the morning.   Yes [provider]  ipratropium-albuterol (DUONEB) 0.5-2.5 (3) MG/3ML SOLN Take 3 mLs by nebulization every 4 (four) hours as needed (for acute and chronic respiratory failure with hypercapnia).   Yes [provider]  lansoprazole (PREVACID SOLUTAB) 30 MG disintegrating tablet Place 30 mg into feeding tube in the morning.   Yes [provider]  levothyroxine (SYNTHROID) 125 MCG tablet Place 125 mcg into feeding tube in the morning.   Yes [provider]  loperamide (IMODIUM A-D) 2 MG tablet Place 2 mg into feeding  tube every 8 (eight) hours as needed for diarrhea or loose stools.   Yes [provider]  Nutritional Supplements (FEEDING SUPPLEMENT, VITAL 1.5 CAL,) LIQD Place 50 mL/hr into feeding tube continuous.   Yes [provider]  ondansetron (ZOFRAN-ODT) 4 MG disintegrating tablet 4 mg See admin instructions. 4 mg, per G-Tube, every six hours as needed for nausea   Yes [provider]  Oxycodone HCl 10 MG  TABS Place 10 mg into feeding tube every 6 (six) hours as needed (for severe pain).   Yes [provider]  PRESCRIPTION MEDICATION Inject 42 mL/hr into the vein See admin instructions. D10W (dextrose 10% in water): 42 ml's/hr "by shift"   Yes [provider]  sertraline (ZOLOFT) 100 MG tablet Place 100 mg into feeding tube daily.   Yes [provider]  Sodium Chloride Flush (NORMAL SALINE FLUSH) 0.9 % SOLN Inject 10 mLs into the vein See admin instructions. Flush with 10 ml's of normal saline before and after medications   Yes [provider]  traZODone (DESYREL) 100 MG tablet Place 100 mg into feeding tube at bedtime.   Yes [provider]     Family History  Problem Relation Age of Onset  . Hypertension Mother   . Hypertension Father   . COPD Brother     Social History   Socioeconomic History  . Marital status: Single    Spouse name: Not on file  . Number of children: Not on file  . Years of education: Not on file  . Highest education level: Not on file  Occupational History  . Not on file  Tobacco Use  . Smoking status: Never Smoker  . Smokeless tobacco: Never Used  Substance and Sexual Activity  . Alcohol use: Not Currently  . Drug use: Yes    Types: IV, Cocaine, Heroin  . Sexual activity: Not on file  Other Topics Concern  . Not on file  Social History Narrative  . Not on file   Social Determinants of Health   Financial Resource Strain: Not on file  Food Insecurity: Not on file  Transportation Needs: Not on file  Physical Activity: Not on file  Stress: Not on file  Social Connections: Not on file    Review of Systems  Unable to perform ROS: Other    Vital Signs: BP (!) 148/87   Pulse 61   Temp (!) 95.9 F (35.5 C) (Axillary)   Resp 18   Ht 6\' 2"  (1.88 m)   Wt 107.7 kg   SpO2 90%   BMI 30.48 kg/m   Physical Exam Constitutional:      Appearance: He is ill-appearing.  HENT:     Head: Normocephalic and  atraumatic.  Neck:     Comments: Tracheostomy Pulmonary:     Comments: Vent dependent Musculoskeletal:        General: Swelling present.     Comments: Quadriplegia  Skin:    Comments: Multiple chronic bedsores  Neurological:     Mental Status: He is alert. Mental status is at baseline.  Psychiatric:        Mood and Affect: Mood normal.     Imaging: US RENAL  Result Date: 05/04/2020 CLINICAL DATA:  Renal failure. EXAM: RENAL / URINARY TRACT ULTRASOUND COMPLETE COMPARISON:  None. FINDINGS: Right Kidney: Renal measurements: 11.2 cm x 5.9 cm x 6.2 cm = volume: 216.67 mL. Echogenicity within normal limits. No mass or hydronephrosis visualized. Left Kidney: Renal measurements: 11.3 cm x  6.3 cm x 6.2 cm = volume: 231.19 mL. Echogenicity within normal limits. No mass or hydronephrosis visualized. Bladder: Not visualized. Other: None. IMPRESSION: Normal renal ultrasound. Electronically Signed   By: Aram Candela M.D.   On: 05/04/2020 21:35   DG ABDOMEN PEG TUBE LOCATION  Result Date: 04/30/2020 CLINICAL DATA:  Jejunostomy tube leak EXAM: ABDOMEN - 1 VIEW COMPARISON:  Portable exam 1103 hours without priors for comparison FINDINGS: Contrast was injected through the indwelling tube and an image was obtained. Contrast opacifies what is likely the gastric lumen. No extravasation of contrast seen. Radiopaque material within transverse and descending colon question additional recent contrast administration. Lead less pacemaker projects over cardiac silhouette. Surgical clips in LEFT pelvis. IMPRESSION: Injected contrast material opacifies what appears to be stomach rather than jejunum, question gastrostomy tube not jejunostomy tube. No extravasated contrast identified. Additional apparent contrast material within transverse colon and descending colon, question additional recent contrast administration. Electronically Signed   By: Ulyses Southward M.D.   On: 04/30/2020 12:44   DG CHEST PORT 1 VIEW  Result  Date: 05/12/2020 CLINICAL DATA:  Shortness of breath EXAM: PORTABLE CHEST 1 VIEW COMPARISON:  None. FINDINGS: Tracheostomy device is present.  Right PICC line tip overlies SVC. Persistent bilateral pleural effusions with bibasilar atelectasis. Left lung aeration is improved. Persistent mild perihilar edema. Similar cardiomediastinal contours. IMPRESSION: Persistent bilateral pleural effusions and bibasilar atelectasis. Persistent but improved probable pulmonary edema. Electronically Signed   By: Guadlupe Spanish M.D.   On: 05/12/2020 16:53   DG CHEST PORT 1 VIEW  Result Date: 05/11/2020 CLINICAL DATA:  Shortness of breath, unable to elevate chin EXAM: PORTABLE CHEST 1 VIEW COMPARISON:  Radiograph 05/08/2020 FINDINGS: Tracheostomy tube tip terminates in the mid trachea. Right upper extremity PICC tip terminates near the superior cavoatrial junction. Telemetry leads overlie the chest. Lung volumes are diminished from comparison exam. Increasing hazy interstitial opacities with a patchy perihilar and basilar predominance and gradient density obscuring the hemidiaphragms likely reflecting worsening pulmonary edema with increasing layering bilateral effusions accentuated by low volumes. Prominence of the cardiomediastinal silhouette, poorly visualized due to overlying opacity. Chin overlies the lung apices. No acute osseous or soft tissue abnormality. IMPRESSION: Worsening pulmonary edema and bilateral effusions. Support devices as above. Electronically Signed   By: Kreg Shropshire M.D.   On: 05/11/2020 20:36   DG Chest Port 1 View  Result Date: 05/08/2020 CLINICAL DATA:  Respiratory failure EXAM: PORTABLE CHEST 1 VIEW COMPARISON:  05/01/2020 FINDINGS: Tracheostomy is unchanged. Right internal jugular central venous catheter has been removed. Pulmonary insufflation is stable. Small bilateral pleural effusions persist with associated bibasilar compressive atelectasis. Mild cardiomegaly is stable. Trace interstitial  pulmonary edema has developed. No pneumothorax. Implanted loop recorder and cervical fusion hardware again noted. IMPRESSION: Interval removal of right internal jugular central venous catheter. Stable small bilateral pleural effusions. Interval development of mild pulmonary edema, likely cardiogenic in nature. Electronically Signed   By: Helyn Numbers MD   On: 05/08/2020 07:12   DG CHEST PORT 1 VIEW  Result Date: 05/01/2020 CLINICAL DATA:  55 year old male status post central line placement. EXAM: PORTABLE CHEST 1 VIEW COMPARISON:  04/30/2020 portable chest. FINDINGS: Portable AP upright view at 0549 hours. Right subclavian versus IJ approach central line catheter configuration is unchanged from yesterday, the catheter tip continues across midline and terminates in the left innominate vein as before. Stable tracheostomy. Partially visible posterior cervicothoracic junction spine fusion hardware. Small cardiac event recorder or superficial ICD. Stable cardiac size  and mediastinal contours. Continued veiling and confluent bibasilar opacity. No pneumothorax. Pulmonary vascularity appears mildly improved. No overt edema in the upper lungs. IMPRESSION: 1. Unchanged right side central line with tip at the left innominate vein level. Recommend repositioning to avoid innominate vein sclerosis. 2. Decreased pulmonary vascular congestion. Otherwise stable ventilation with evidence of bilateral pleural effusions with lower lobe collapse or consolidation. Electronically Signed   By: Odessa Fleming M.D.   On: 05/01/2020 06:27   DG CHEST PORT 1 VIEW  Result Date: 04/30/2020 CLINICAL DATA:  Pleural effusion EXAM: PORTABLE CHEST 1 VIEW COMPARISON:  04/16/2020 FINDINGS: Tracheostomy tube terminates in the mid trachea, 4.7 cm from the carina. Right IJ approach central venous catheter courses to the level of the SVC, redirected superiorly and likely terminating within the left brachiocephalic vein. Consider repositioning. Telemetry  leads overlie the chest. Stable appearance of the chest with Veiling opacity in the mid to lower lungs with obscuration of the hemidiaphragms likely reflecting a combination of atelectasis and layering pleural effusion though underlying airspace disease is difficult to fully exclude. Stable cardiomediastinal contours. No acute osseous or soft tissue abnormality. Prior cervical fusion incompletely assessed on this exam. IMPRESSION: 1. Veiling opacity in the mid to lower lungs likely reflecting a combination of atelectasis and layering pleural effusion. Underlying airspace disease is difficult to fully exclude. 2. Right IJ approach central venous catheter courses to the level of the SVC, redirected superiorly and likely terminating within the left brachiocephalic vein. Electronically Signed   By: Kreg Shropshire M.D.   On: 04/30/2020 05:02   DG Chest Portable 1 View  Result Date: 04/06/2020 CLINICAL DATA:  Hypotension. EXAM: PORTABLE CHEST 1 VIEW COMPARISON:  None. FINDINGS: Dense retrocardiac opacity. Right basilar atelectasis. Possible small left pleural effusion. No visible pneumothorax. Central venous catheter tip projects left of midline, most likely in the left brachiocephalic vein. Tracheostomy tube tip projects 3.7 cm above the carina. Partially imaged cervical ACDF. Mild enlargement the cardiac silhouette. Suspected loop recorder projects over the cardiac silhouette. IMPRESSION: 1. Dense left retrocardiac opacity, concerning for left lower lobe collapse. Aspiration or pneumonia is not excluded. Recommend follow-up to resolution. 2. Possible small left pleural effusion. 3. Central venous catheter tip projects left of midline, most likely in the left brachiocephalic vein. Findings discussed with Sponseller via telephone at 4:35 PM. Electronically Signed   By: Feliberto Harts MD   On: 04/23/2020 16:38   DG Shoulder Left  Result Date: 05/12/2020 CLINICAL DATA:  Left shoulder pain and bruising. EXAM: LEFT  SHOULDER - 2+ VIEW COMPARISON:  None. FINDINGS: There is no evidence of fracture or dislocation. There is no evidence of arthropathy or other focal bone abnormality. Soft tissues are unremarkable. IMPRESSION: Negative exam. Electronically Signed   By: Drusilla Kanner M.D.   On: 05/12/2020 15:57   DG Abd Portable 1V  Addendum Date: 04/30/2020   ADDENDUM REPORT: 04/30/2020 15:30 ADDENDUM: This examination was subsequently discussed with Dr. Nobie Putnam. He reports that the patient has a GJ tube with a single skin entrance in the epigastric area. In light of this information, the tip of the tube is likely in the descending duodenum as correlated with the earlier study. Contrast in the stomach may relate to the previous contrast injection or reflux. No tubing is seen within the jejunum. This could be further clarified under fluoroscopy or by CT as clinically warranted. Electronically Signed   By: Carey Bullocks M.D.   On: 04/30/2020 15:30   Result Date: 04/30/2020  CLINICAL DATA:  Injection of contrast into J-tube to confirm location EXAM: PORTABLE ABDOMEN - 1 VIEW COMPARISON:  Previous radiograph earlier the same date. FINDINGS: 1428 hours. The tube is unchanged from the earlier examination with the tip looped in the proximal stomach. Contrast has partially emptied from the stomach into the duodenum. Additional contrast is present in the transverse colon as noted previously. No contrast leak identified. Surgical clips are noted in the left pelvis. IMPRESSION: No significant change. The tip of the percutaneous tube is looped in the proximal stomach. Electronically Signed: By: Carey Bullocks M.D. On: 04/30/2020 15:02   VAS [place] UPPER EXTREMITY VENOUS DUPLEX  Result Date: 05/11/2020 UPPER VENOUS STUDY  Indications: Swelling Comparison Study: no prior Performing Technologist: Blanch Media RVS  Examination Guidelines: A complete evaluation includes B-mode imaging, spectral Doppler, color Doppler, and power Doppler as  needed of all accessible portions of each vessel. Bilateral testing is considered an integral part of a complete examination. Limited examinations for reoccurring indications may be performed as noted.  Left Findings: +----------+------------+---------+-----------+----------+--------------+ LEFT      CompressiblePhasicitySpontaneousProperties   Summary     +----------+------------+---------+-----------+----------+--------------+ IJV                                                 Not visualized +----------+------------+---------+-----------+----------+--------------+ Subclavian    Full       Yes       Yes                             +----------+------------+---------+-----------+----------+--------------+ Axillary      Full       Yes       Yes                             +----------+------------+---------+-----------+----------+--------------+ Brachial      Full       Yes       Yes                             +----------+------------+---------+-----------+----------+--------------+ Radial        Full                                                 +----------+------------+---------+-----------+----------+--------------+ Ulnar         Full                                                 +----------+------------+---------+-----------+----------+--------------+ Cephalic      None                                                 +----------+------------+---------+-----------+----------+--------------+ Basilic       Full                                                 +----------+------------+---------+-----------+----------+--------------+  Summary:  Left: No evidence of deep vein thrombosis in the upper extremity. Findings consistent with age indeterminate superficial vein thrombosis involving the left cephalic vein.  *See table(s) above for measurements and observations.  Diagnosing physician: Waverly Ferrari MD Electronically signed by Waverly Ferrari MD on 05/11/2020 at 5:02:39 PM.    Final    ECHOCARDIOGRAM LIMITED  Result Date: 05/14/2020    ECHOCARDIOGRAM LIMITED REPORT   Patient Name:   AVANTE Bunte Date of Exam: 05/14/2020 Medical Rec #:  [id number]        Height:       74.0 in Accession #:    [id number]       Weight:       251.1 lb Date of Birth:  Mar 08, 1965        BSA:          2.394 m Patient Age:    54 years         BP:           153/88 mmHg Patient Gender: M                HR:           83 bpm. Exam Location:  Inpatient Procedure: Limited Echo, Limited Color Doppler and Cardiac Doppler Indications:    CHF-Acute Diastolic I50.31  History:        Patient has prior history of Echocardiogram examinations, most                 recent 04/30/2020. Pacemaker; Risk Factors:Diabetes.  Sonographer:    Thurman Coyer RDCS (AE) Referring Phys: [phone] MIHAI CROITORU IMPRESSIONS  1. Left ventricular ejection fraction, by estimation, is 55%. The left ventricle has normal function. Left ventricular diastolic parameters were normal.  2. Right ventricular systolic function is normal. The right ventricular size is moderately enlarged. A leadless pacemaker is visualized.  3. Trivial mitral valve regurgitation. FINDINGS  Left Ventricle: Left ventricular ejection fraction, by estimation, is 55%. The left ventricle has normal function. Left ventricular diastolic parameters were normal. Right Ventricle: The right ventricular size is moderately enlarged. Right ventricular systolic function is normal. Mitral Valve: Trivial mitral valve regurgitation. Additional Comments: A leadless pacemaker is visualized.  Diastology LV e' medial:    9.57 cm/s LV E/e' medial:  10.0 LV e' lateral:   14.90 cm/s LV E/e' lateral: 6.4  MITRAL VALVE MV Area (PHT): 3.77 cm MV Decel Time: 201 msec MV E velocity: 95.70 cm/s MV A velocity: 65.60 cm/s MV E/A ratio:  1.46 Weston Brass MD Electronically signed by Weston Brass MD Signature Date/Time: 05/14/2020/12:56:19 PM    Final     ECHOCARDIOGRAM LIMITED  Result Date: 04/30/2020    ECHOCARDIOGRAM LIMITED REPORT   Patient Name:   QASIM Freiman Date of Exam: 04/30/2020 Medical Rec #:  [id number]        Height:       74.0 in Accession #:    [id number]       Weight:       229.9 lb Date of Birth:  Mar 13, 1965        BSA:          2.306 m Patient Age:    54 years         BP:           100/62 mmHg Patient Gender: M                HR:  80 bpm. Exam Location:  Inpatient Procedure: Limited Echo, Cardiac Doppler and Color Doppler Indications:    CHF  History:        Patient has no prior history of Echocardiogram examinations.                 CHF. Septic shock, Resp, failure, IVDU, cirrhosis, quadriplegia.  Sonographer:    Lavenia Atlas Referring Phys: [phone] GRACE E BOWSER IMPRESSIONS  1. Left ventricular ejection fraction, by estimation, is 45 to 50%. The left ventricle has mildly decreased function. The left ventricle demonstrates global hypokinesis. Left ventricular diastolic parameters are consistent with Grade II diastolic dysfunction (pseudonormalization).  2. Right ventricular systolic function is moderately reduced.  3. The pericardial effusion is anterior to the right ventricle and circumferential. FINDINGS  Left Ventricle: Left ventricular ejection fraction, by estimation, is 45 to 50%. The left ventricle has mildly decreased function. The left ventricle demonstrates global hypokinesis. Left ventricular diastolic parameters are consistent with Grade II diastolic dysfunction (pseudonormalization). Right Ventricle: Limited echo for LV function. Right ventricular systolic function is moderately reduced. Pericardium: Trivial pericardial effusion is present. The pericardial effusion is anterior to the right ventricle and circumferential. Additional Comments: A pacer wire is visualized. LEFT VENTRICLE PLAX 2D LVIDd:         4.40 cm  Diastology LVIDs:         3.80 cm  LV e' medial:    4.57 cm/s LV PW:         1.10 cm  LV E/e'  medial:  15.8 LV IVS:        1.10 cm  LV e' lateral:   4.35 cm/s LVOT diam:     2.80 cm  LV E/e' lateral: 16.6 LVOT Area:     6.16 cm  RIGHT VENTRICLE RV S prime:     7.40 cm/s LEFT ATRIUM         Index LA diam:    3.60 cm 1.56 cm/m   AORTA Ao Root diam: 3.60 cm MITRAL VALVE MV Area (PHT): 3.48 cm    SHUNTS MV Decel Time: 218 msec    Systemic Diam: 2.80 cm MV E velocity: 72.40 cm/s MV A velocity: 44.60 cm/s MV E/A ratio:  1.62 Donato Schultz MD Electronically signed by Donato Schultz MD Signature Date/Time: 04/30/2020/1:53:17 PM    Final    [place] EKG SITE RITE  Result Date: 05/11/2020 If Site Rite image not attached, placement could not be confirmed due to current cardiac rhythm.  [place] EKG SITE RITE  Result Date: 05/01/2020 If [facility] image not attached, placement could not be confirmed due to current cardiac rhythm.   Labs:  CBC: Recent Labs    05/17/20 1619 05/18/20 0218 05/20/20 0607 05/21/20 0452 05/21/20 1335 05/22/20 0527  WBC 8.0  --  5.1 4.3  --  6.2  HGB 7.7*   < > 7.2* 6.7* 7.6* 8.0*  HCT 24.6*   < > 23.4* 21.6* 24.3* 25.2*  PLT 137*  --  121* 112*  --  125*   < > = values in this interval not displayed.    COAGS: Recent Labs    May 07, 2020 2200 05/11/20 0637  INR 1.4* 1.2  APTT 30 38*    BMP: Recent Labs    05/19/20 0224 05/20/20 0511 05/21/20 0452 05/22/20 0527  NA 139 142 142 142  K 4.9 4.6 4.6 4.3  CL 102 102 102 103  CO2 25 25 25 23   GLUCOSE 116* 117* 116* 112*  BUN 224* 233* 255* 270*  CALCIUM 9.0 9.2 9.2 8.9  CREATININE 2.33* 2.38* 2.45* 2.53*  GFRNONAA 33* 32* 31* 30*    LIVER FUNCTION TESTS: Recent Labs    05/03/20 0141 05/06/20 0353 05/07/20 0730 05/08/20 1610 05/09/20 0151 05/11/20 0425 05/12/20 0432 05/19/20 0224 05/20/20 0511 05/21/20 0452 05/22/20 0527  BILITOT 0.9  --  0.8 1.0  --  0.6  --   --   --   --   --   AST 13*  --  22 36  --  36  --   --   --   --   --   ALT 8  --  12 16  --  20  --   --   --   --   --   ALKPHOS 100  --   158* 191*  --  217*  --   --   --   --   --   PROT 6.2*  --  6.3* 6.1*  --  6.3*  --   --   --   --   --   ALBUMIN 2.0*   < > 2.4*  2.4* 2.5*   < > 2.0*   < > 2.2* 2.3* 2.3* 2.4*   < > = values in this interval not displayed.    TUMOR MARKERS: No results for input(s): AFPTM, CEA, CA199, CHROMGRNA in the last 8760 hours.  Assessment and Plan:  Acute kidney injury secondary to ischemic ATN from pressors.  We will proceed with image guided placement of a temporary hemodialysis catheter today by Dr. Loreta Ave.  Risks and benefits discussed with the patient including, but not limited to bleeding, infection, vascular injury, pneumothorax which may require chest tube placement, air embolism or even death  All of the patient's questions were answered, patient is agreeable to proceed. Consent signed and in chart.  Thank you for this interesting consult.  I greatly enjoyed meeting Phillip Klein and look forward to participating in their care.  A copy of this report was sent to the requesting provider on this date.  Electronically Signed: Gwynneth Macleod, PA-C   05/22/2020, 12:49 PM      I spent a total of 20 Minutes in face to face in clinical consultation, greater than 50% of which was counseling/coordinating care for placement of temporary hemodialysis catheter.

## 2020-05-22 NOTE — Progress Notes (Signed)
Pt refused care while wife in the room.... Education given to both pt and wife on importance of letting us help him.   He did allow me to do oral care but refuses positioning and wound care... Adv him that wound change will allow the wound to heal and avoid further complications.

## 2020-05-22 NOTE — Progress Notes (Signed)
[facility]                                                                              Patient Demographics  Phillip Klein, is a 55 y.o. male, DOB - 06-Jan-1966, LPF:[id number]  Admit date - 04/22/2020   Admitting Physician Lanier Clam, MD  Outpatient Primary MD for the patient is [name], MD  Outpatient specialists:   LOS - 23  days   Medical records reviewed and are as summarized below:    Chief Complaint  Patient presents with  . Hypotension       Brief summary   55 years old male with PMH significant for chronic hypoxic respiratory failure with tracheostomy and vent since 2021 after C-spine surgery, GERD, DM, anemia of chronic disease, quadriplegia, malnutrition, neurogenic orthostatic hypotension, untreated HCV, previous IV drug use, cirrhosis, chronic pain syndrome was sent from [facility] with septic shock.  Patient admitted in the ICU with septic shock secondary to ESBL/ [name] bacteremia.  Patient had Hickman's catheter which was removed,  presumed to be the source of infection.  Patient has completed meropenem for 10 days after removal of the catheter.  Patient is having acute kidney injury which is recovering, Cardiology, Nephrology is following.  PCCM is assisting with vent management.  Patient had multiple chronic bedsores, wound care is following.  Palliative care consulted to discuss goals of care.  05/22/2020: Available records reviewed.  Discussed with the patient's nurse.  Patient seen.  Significantly elevated BUN noted.  Patient is on 2 different types of tube feeds.  Nephrology input is appreciated.  Hemodialysis is planned by nephrology team if agreeable with the patient and patient's power of attorney.  Patient is still awaiting for the wife to come so they can make a decision if to proceed with hemodialysis access placement.  Assessment & Plan    Principal Problem: Septic shock, present on admission secondary  to [name] bacteremia, UTI versus line infection  -Hickman catheter presumed to be the source of infection, removed. -Blood cultures + [name] bacteremia, UA positive, culture showed multiple species -Completed meropenem for 10 days on 05/12/2020, repeat blood cultures negative -PICC line inserted on 3/8, venous Doppler showed no DVT. -Sepsis physiology resolved  Active problems Acute kidney injury secondary to ischemic ATN, septic shock requiring pressors, chronic hypotension, anasarca -Patient was placed on albumin and Lasix for anasarca, per nephrology has not been effective, continue Lasix -Nephrology following, not a candidate for long-term dialysis, showed however may be an option for management of severe azotemia. -Palliative medicine also following.  05/22/20: See above documentation.  Hemodialysis is planned.  Acute blood loss anemia with intermittent bleeding from wounds -Platelet dysfunction in the setting of severe azotemia, nephrology recommending the conjugated estrogen (Premarin) via tube for 5 days, started on 3/16 - received 1 unit packed RBCs on 3/16  -hemoglobin again down to 6.7, will transfuse 1 unit packed RBC 05/22/2020: Hemoglobin is 8 g/dL today.  Will check stool lactoferrin to rule out GI bleed.  BUN significantly elevated, however, potassium is within normal limit.  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  Will start patient on IV albumin 25 g every 6 hourly.  Albumin is 2.4.  Patient is awaiting  his wife's input so they can make the decision regarding hemodialysis access.  Chronic ventilator dependent respiratory failure, quadriplegia -Management per CCM, continue ventilatory support with trach -Continue feeding through G-tube, at goal  Hypotension, likely orthostatic hypotension Florinef was discontinued in ICU due to concern for fluid retention, midodrine has been stopped. BP stable  Chronic pain syndrome -Currently stable, continue fentanyl patch, oxycodone  Depression, insomnia Continue sertraline, trazodone, clonazepam  GERD Continue PPI  Left arm swelling -Venous Dopplers negative for DVT  Hematuria -resolving, urology was consulted, no need for any acute intervention  Multiple pressure wounds, present on admission -Full-thickness vertebral column stage IV -Sacrum stage IV, -Right and left ischial tuberosity, stage IV -Right lateral hip, stage IV -Right and left lower leg lateral, unstageable - Right (unstageable now) and left posterior elbow, stage III - left posterior lower arm, unstageable    Obesity Estimated body mass index is 30.48 kg/m as calculated from the following:   Height as of this encounter: 6' 2" (1.88 m).   Weight as of this encounter: 107.7 kg.  Code Status:  Full  DVT Prophylaxis:  SCDs Start: 04/20/2020 1811   Level of Care: Level of care: ICU Family Communication:  Disposition Plan:     Status is: Inpatient  Remains inpatient appropriate because:Inpatient level of care appropriate due to severity of illness   Dispo: The patient is from: SNF              Anticipated d/c is to: LTAC              Patient currently is not medically stable to d/c. Transfer back to [facility] or select pending bed availability and cleared by nephrology   Difficult to place patient No   Time Spent in minutes   43mns   Procedures:    Consultants:   Critical care Nephrology Palliative medicine Cardiology  Antimicrobials:   Anti-infectives  (From admission, onward)   Start     Dose/Rate Route Frequency Ordered Stop   05/19/20 1000  fluconazole (DIFLUCAN) tablet 50 mg       "Followed by" Linked Group Details   50 mg Oral Daily 05/18/20 1048 05/28/20 0959   05/18/20 1145  fluconazole (DIFLUCAN) tablet 100 mg       "Followed by" Linked Group Details   100 mg Oral  Once 05/18/20 1048 05/18/20 1145   05/11/20 0800  meropenem (MERREM) 1 g in sodium chloride 0.9 % 100 mL IVPB        1 g 200 mL/hr over 30 Minutes Intravenous Every 8 hours 05/11/20 0707 05/12/20 1555   05/11/20 0715  meropenem (MERREM) 1 g in sodium chloride 0.9 % 100 mL IVPB  Status:  Discontinued        1 g 200 mL/hr over 30 Minutes Intravenous Every 8 hours 05/11/20 0706 05/11/20 0707   05/03/20 2200  meropenem (MERREM) 1 g in sodium chloride 0.9 % 100 mL IVPB  Status:  Discontinued        1 g 200  mL/hr over 30 Minutes Intravenous Every 12 hours 05/03/20 1041 05/11/20 0706   04/30/20 1145  meropenem (MERREM) 2 g in sodium chloride 0.9 % 100 mL IVPB  Status:  Discontinued        2 g 200 mL/hr over 30 Minutes Intravenous Every 12 hours 04/30/20 1052 05/03/20 1041   04/30/20 0530  ceFEPIme (MAXIPIME) 2 g in sodium chloride 0.9 % 100 mL IVPB  Status:  Discontinued        2 g 200 mL/hr over 30 Minutes Intravenous Every 12 hours 04/20/2020 1852 04/30/20 1051   04/26/2020 1852  vancomycin variable dose per unstable renal function (pharmacist dosing)  Status:  Discontinued         Does not apply See admin instructions 04/15/2020 1852 04/30/20 1051   04/10/2020 1630  vancomycin (VANCOREADY) IVPB 2000 mg/400 mL        2,000 mg 200 mL/hr over 120 Minutes Intravenous  Once 04/28/2020 1621 04/12/2020 2008   04/20/2020 1630  ceFEPIme (MAXIPIME) 2 g in sodium chloride 0.9 % 100 mL IVPB        2 g 200 mL/hr over 30 Minutes Intravenous  Once 04/15/2020 1621 04/17/2020 1749         Medications  Scheduled Meds: . carvedilol  6.25 mg Per Tube BID WC  . chlorhexidine gluconate (MEDLINE  KIT)  15 mL Mouth Rinse BID  . Chlorhexidine Gluconate Cloth  6 each Topical Daily  . Chlorhexidine Gluconate Cloth  6 each Topical Q0600  . clonazePAM  0.5 mg Per Tube BID  . darbepoetin (ARANESP) injection - NON-DIALYSIS  60 mcg Subcutaneous Q Mon-1800  . estrogens (conjugated)  5 mg Oral Daily  . feeding supplement (PROSource TF)  45 mL Per Tube 5 X Daily  . fentaNYL  1 patch Transdermal Q72H   And  . fentaNYL  1 patch Transdermal Q72H  . fluconazole  50 mg Oral Daily  . levothyroxine  125 mcg Per Tube Q0600  . mouth rinse  15 mL Mouth Rinse 10 times per day  . multivitamin with minerals  1 tablet Per Tube Daily  . nutrition supplement (JUVEN)  1 packet Per Tube BID BM  . pantoprazole sodium  40 mg Per Tube BID  . sertraline  100 mg Per Tube Daily  . sodium chloride flush  10-40 mL Intracatheter Q12H  . traZODone  100 mg Per Tube QHS   Continuous Infusions: . sodium chloride Stopped (05/21/20 1550)  . feeding supplement (NEPRO CARB STEADY) 1,000 mL (05/22/20 0955)  . furosemide Stopped (05/22/20 0610)   PRN Meds:.sodium chloride, acetaminophen, albuterol, docusate, fentaNYL (SUBLIMAZE) injection, hydrALAZINE, oxyCODONE, polyethylene glycol, prochlorperazine, sodium chloride flush      Subjective:   Cassian Torelli was seen and examined today.  No acute complaints.  Anasarca.  No fevers.  Hemoglobin down to 6.7 again, transfusion ongoing.   Objective:   Vitals:   05/22/20 1000 05/22/20 1100 05/22/20 1119 05/22/20 1121  BP: (!) 170/93 (!) 154/92  (!) 154/92  Pulse: (!) 59 60  60  Resp: _0 Temp:   (!) 95.9 F (35.5 C)   TempSrc:   Axillary   SpO2: 97% 96%  95%  Weight:      Height:        Intake/Output Summary (Last 24 hours) at 05/22/2020 1217 Last data filed at 05/22/2020 1100 Gross per 24 hour  Intake 1320.98 ml  Output 1615 ml  Net -294.02 ml  Wt Readings from Last 3 Encounters:  05/22/20 107.7 kg   Physical Exam  General: Patient is not  in any distress.  Patient is awake and alert.  Patient is obese.    Cardiovascular: S1 S2   Respiratory: Adequate air entry.  Gastrointestinal: Obese, soft and nontender.  Colostomy bag on the left, dressing intact  Ext: bilateral heel protectors, anasarca, pitting edema  Neuro: Patient remains awake and alert.  No new deficits Musculoskeletal: Edema.  Data Reviewed:  I have personally reviewed following labs and imaging studies  Micro Results No results found for this or any previous visit (from the past 240 hour(s)).  Radiology Reports US RENAL  Result Date: 05/04/2020 CLINICAL DATA:  Renal failure. EXAM: RENAL / URINARY TRACT ULTRASOUND COMPLETE COMPARISON:  None. FINDINGS: Right Kidney: Renal measurements: 11.2 cm x 5.9 cm x 6.2 cm = volume: 216.67 mL. Echogenicity within normal limits. No mass or hydronephrosis visualized. Left Kidney: Renal measurements: 11.3 cm x 6.3 cm x 6.2 cm = volume: 231.19 mL. Echogenicity within normal limits. No mass or hydronephrosis visualized. Bladder: Not visualized. Other: None. IMPRESSION: Normal renal ultrasound. Electronically Signed   By: Virgina Norfolk M.D.   On: 05/04/2020 21:35   DG ABDOMEN PEG TUBE LOCATION  Result Date: 04/30/2020 CLINICAL DATA:  Jejunostomy tube leak EXAM: ABDOMEN - 1 VIEW COMPARISON:  Portable exam 1103 hours without priors for comparison FINDINGS: Contrast was injected through the indwelling tube and an image was obtained. Contrast opacifies what is likely the gastric lumen. No extravasation of contrast seen. Radiopaque material within transverse and descending colon question additional recent contrast administration. Lead less pacemaker projects over cardiac silhouette. Surgical clips in LEFT pelvis. IMPRESSION: Injected contrast material opacifies what appears to be stomach rather than jejunum, question gastrostomy tube not jejunostomy tube. No extravasated contrast identified. Additional apparent contrast material within  transverse colon and descending colon, question additional recent contrast administration. Electronically Signed   By: Lavonia Dana M.D.   On: 04/30/2020 12:44   DG CHEST PORT 1 VIEW  Result Date: 05/12/2020 CLINICAL DATA:  Shortness of breath EXAM: PORTABLE CHEST 1 VIEW COMPARISON:  None. FINDINGS: Tracheostomy device is present.  Right PICC line tip overlies SVC. Persistent bilateral pleural effusions with bibasilar atelectasis. Left lung aeration is improved. Persistent mild perihilar edema. Similar cardiomediastinal contours. IMPRESSION: Persistent bilateral pleural effusions and bibasilar atelectasis. Persistent but improved probable pulmonary edema. Electronically Signed   By: Macy Mis M.D.   On: 05/12/2020 16:53   DG CHEST PORT 1 VIEW  Result Date: 05/11/2020 CLINICAL DATA:  Shortness of breath, unable to elevate chin EXAM: PORTABLE CHEST 1 VIEW COMPARISON:  Radiograph 05/08/2020 FINDINGS: Tracheostomy tube tip terminates in the mid trachea. Right upper extremity PICC tip terminates near the superior cavoatrial junction. Telemetry leads overlie the chest. Lung volumes are diminished from comparison exam. Increasing hazy interstitial opacities with a patchy perihilar and basilar predominance and gradient density obscuring the hemidiaphragms likely reflecting worsening pulmonary edema with increasing layering bilateral effusions accentuated by low volumes. Prominence of the cardiomediastinal silhouette, poorly visualized due to overlying opacity. Chin overlies the lung apices. No acute osseous or soft tissue abnormality. IMPRESSION: Worsening pulmonary edema and bilateral effusions. Support devices as above. Electronically Signed   By: Lovena Le M.D.   On: 05/11/2020 20:36   DG Chest Port 1 View  Result Date: 05/08/2020 CLINICAL DATA:  Respiratory failure EXAM: PORTABLE CHEST 1 VIEW COMPARISON:  05/01/2020 FINDINGS: Tracheostomy is unchanged. Right internal jugular central  venous catheter has  been removed. Pulmonary insufflation is stable. Small bilateral pleural effusions persist with associated bibasilar compressive atelectasis. Mild cardiomegaly is stable. Trace interstitial pulmonary edema has developed. No pneumothorax. Implanted loop recorder and cervical fusion hardware again noted. IMPRESSION: Interval removal of right internal jugular central venous catheter. Stable small bilateral pleural effusions. Interval development of mild pulmonary edema, likely cardiogenic in nature. Electronically Signed   By: Fidela Salisbury MD   On: 05/08/2020 07:12   DG CHEST PORT 1 VIEW  Result Date: 05/01/2020 CLINICAL DATA:  55 year old male status post central line placement. EXAM: PORTABLE CHEST 1 VIEW COMPARISON:  04/30/2020 portable chest. FINDINGS: Portable AP upright view at 0549 hours. Right subclavian versus IJ approach central line catheter configuration is unchanged from yesterday, the catheter tip continues across midline and terminates in the left innominate vein as before. Stable tracheostomy. Partially visible posterior cervicothoracic junction spine fusion hardware. Small cardiac event recorder or superficial ICD. Stable cardiac size and mediastinal contours. Continued veiling and confluent bibasilar opacity. No pneumothorax. Pulmonary vascularity appears mildly improved. No overt edema in the upper lungs. IMPRESSION: 1. Unchanged right side central line with tip at the left innominate vein level. Recommend repositioning to avoid innominate vein sclerosis. 2. Decreased pulmonary vascular congestion. Otherwise stable ventilation with evidence of bilateral pleural effusions with lower lobe collapse or consolidation. Electronically Signed   By: Genevie Ann M.D.   On: 05/01/2020 06:27   DG CHEST PORT 1 VIEW  Result Date: 04/30/2020 CLINICAL DATA:  Pleural effusion EXAM: PORTABLE CHEST 1 VIEW COMPARISON:  04/06/2020 FINDINGS: Tracheostomy tube terminates in the mid trachea, 4.7 cm from the carina.  Right IJ approach central venous catheter courses to the level of the SVC, redirected superiorly and likely terminating within the left brachiocephalic vein. Consider repositioning. Telemetry leads overlie the chest. Stable appearance of the chest with Veiling opacity in the mid to lower lungs with obscuration of the hemidiaphragms likely reflecting a combination of atelectasis and layering pleural effusion though underlying airspace disease is difficult to fully exclude. Stable cardiomediastinal contours. No acute osseous or soft tissue abnormality. Prior cervical fusion incompletely assessed on this exam. IMPRESSION: 1. Veiling opacity in the mid to lower lungs likely reflecting a combination of atelectasis and layering pleural effusion. Underlying airspace disease is difficult to fully exclude. 2. Right IJ approach central venous catheter courses to the level of the SVC, redirected superiorly and likely terminating within the left brachiocephalic vein. Electronically Signed   By: Lovena Le M.D.   On: 04/30/2020 05:02   DG Chest Portable 1 View  Result Date: 04/08/2020 CLINICAL DATA:  Hypotension. EXAM: PORTABLE CHEST 1 VIEW COMPARISON:  None. FINDINGS: Dense retrocardiac opacity. Right basilar atelectasis. Possible small left pleural effusion. No visible pneumothorax. Central venous catheter tip projects left of midline, most likely in the left brachiocephalic vein. Tracheostomy tube tip projects 3.7 cm above the carina. Partially imaged cervical ACDF. Mild enlargement the cardiac silhouette. Suspected loop recorder projects over the cardiac silhouette. IMPRESSION: 1. Dense left retrocardiac opacity, concerning for left lower lobe collapse. Aspiration or pneumonia is not excluded. Recommend follow-up to resolution. 2. Possible small left pleural effusion. 3. Central venous catheter tip projects left of midline, most likely in the left brachiocephalic vein. Findings discussed with [place] via telephone  at 4:35 PM. Electronically Signed   By: Margaretha Sheffield MD   On: 04/26/2020 16:38   DG Shoulder Left  Result Date: 05/12/2020 CLINICAL DATA:  Left shoulder pain and bruising.  EXAM: LEFT SHOULDER - 2+ VIEW COMPARISON:  None. FINDINGS: There is no evidence of fracture or dislocation. There is no evidence of arthropathy or other focal bone abnormality. Soft tissues are unremarkable. IMPRESSION: Negative exam. Electronically Signed   By: Inge Rise M.D.   On: 05/12/2020 15:57   DG Abd Portable 1V  Addendum Date: 04/30/2020   ADDENDUM REPORT: 04/30/2020 15:30 ADDENDUM: This examination was subsequently discussed with Dr. Caron Presume. He reports that the patient has a GJ tube with a single skin entrance in the epigastric area. In light of this information, the tip of the tube is likely in the descending duodenum as correlated with the earlier study. Contrast in the stomach may relate to the previous contrast injection or reflux. No tubing is seen within the jejunum. This could be further clarified under fluoroscopy or by CT as clinically warranted. Electronically Signed   By: Richardean Sale M.D.   On: 04/30/2020 15:30   Result Date: 04/30/2020 CLINICAL DATA:  Injection of contrast into J-tube to confirm location EXAM: PORTABLE ABDOMEN - 1 VIEW COMPARISON:  Previous radiograph earlier the same date. FINDINGS: 1428 hours. The tube is unchanged from the earlier examination with the tip looped in the proximal stomach. Contrast has partially emptied from the stomach into the duodenum. Additional contrast is present in the transverse colon as noted previously. No contrast leak identified. Surgical clips are noted in the left pelvis. IMPRESSION: No significant change. The tip of the percutaneous tube is looped in the proximal stomach. Electronically Signed: By: Richardean Sale M.D. On: 04/30/2020 15:02   VAS [place] UPPER EXTREMITY VENOUS DUPLEX  Result Date: 05/11/2020 UPPER VENOUS STUDY  Indications: Swelling  Comparison Study: no prior Performing Technologist: Abram Sander RVS  Examination Guidelines: A complete evaluation includes B-mode imaging, spectral Doppler, color Doppler, and power Doppler as needed of all accessible portions of each vessel. Bilateral testing is considered an integral part of a complete examination. Limited examinations for reoccurring indications may be performed as noted.  Left Findings: +----------+------------+---------+-----------+----------+--------------+ LEFT      CompressiblePhasicitySpontaneousProperties   Summary     +----------+------------+---------+-----------+----------+--------------+ IJV                                                 Not visualized +----------+------------+---------+-----------+----------+--------------+ Subclavian    Full       Yes       Yes                             +----------+------------+---------+-----------+----------+--------------+ Axillary      Full       Yes       Yes                             +----------+------------+---------+-----------+----------+--------------+ Brachial      Full       Yes       Yes                             +----------+------------+---------+-----------+----------+--------------+ Radial        Full                                                 +----------+------------+---------+-----------+----------+--------------+  Ulnar         Full                                                 +----------+------------+---------+-----------+----------+--------------+ Cephalic      None                                                 +----------+------------+---------+-----------+----------+--------------+ Basilic       Full                                                 +----------+------------+---------+-----------+----------+--------------+  Summary:  Left: No evidence of deep vein thrombosis in the upper extremity. Findings consistent with age indeterminate  superficial vein thrombosis involving the left cephalic vein.  *See table(s) above for measurements and observations.  Diagnosing physician: Deitra Mayo MD Electronically signed by Deitra Mayo MD on 05/11/2020 at 5:02:39 PM.    Final    ECHOCARDIOGRAM LIMITED  Result Date: 05/14/2020    ECHOCARDIOGRAM LIMITED REPORT   Patient Name:   TABB Pellow Date of Exam: 05/14/2020 Medical Rec #:  [id number]        Height:       74.0 in Accession #:    [id number]       Weight:       251.1 lb Date of Birth:  [birth year]        BSA:          2.394 m Patient Age:    72 years         BP:           153/88 mmHg Patient Gender: M                HR:           83 bpm. Exam Location:  Inpatient Procedure: Limited Echo, Limited Color Doppler and Cardiac Doppler Indications:    CHF-Acute Diastolic L24.40  History:        Patient has prior history of Echocardiogram examinations, most                 recent 04/30/2020. Pacemaker; Risk Factors:Diabetes.  Sonographer:    Mikki Santee RDCS (AE) Referring Phys: [place]  1. Left ventricular ejection fraction, by estimation, is 55%. The left ventricle has normal function. Left ventricular diastolic parameters were normal.  2. Right ventricular systolic function is normal. The right ventricular size is moderately enlarged. A leadless pacemaker is visualized.  3. Trivial mitral valve regurgitation. FINDINGS  Left Ventricle: Left ventricular ejection fraction, by estimation, is 55%. The left ventricle has normal function. Left ventricular diastolic parameters were normal. Right Ventricle: The right ventricular size is moderately enlarged. Right ventricular systolic function is normal. Mitral Valve: Trivial mitral valve regurgitation. Additional Comments: A leadless pacemaker is visualized.  Diastology LV e' medial:    9.57 cm/s LV E/e' medial:  10.0 LV e' lateral:   14.90 cm/s LV E/e' lateral: 6.4  MITRAL VALVE MV Area (PHT): 3.77 cm MV Decel Time: 201  msec MV E velocity: 95.70 cm/s MV  A velocity: 65.60 cm/s MV E/A ratio:  1.46 Cherlynn Kaiser MD Electronically signed by Cherlynn Kaiser MD Signature Date/Time: 05/14/2020/12:56:19 PM    Final    ECHOCARDIOGRAM LIMITED  Result Date: 04/30/2020    ECHOCARDIOGRAM LIMITED REPORT   Patient Name:   PELLEGRINO Harrower Date of Exam: 04/30/2020 Medical Rec #:  [id number]        Height:       74.0 in Accession #:    [id number]       Weight:       229.9 lb Date of Birth:  22-May-1965        BSA:          2.306 m Patient Age:    31 years         BP:           100/62 mmHg Patient Gender: M                HR:           80 bpm. Exam Location:  Inpatient Procedure: Limited Echo, Cardiac Doppler and Color Doppler Indications:    CHF  History:        Patient has no prior history of Echocardiogram examinations.                 CHF. Septic shock, Resp, failure, IVDU, cirrhosis, quadriplegia.  Sonographer:    Dustin Flock Referring Phys: [phone] [place]  1. Left ventricular ejection fraction, by estimation, is 45 to 50%. The left ventricle has mildly decreased function. The left ventricle demonstrates global hypokinesis. Left ventricular diastolic parameters are consistent with Grade II diastolic dysfunction (pseudonormalization).  2. Right ventricular systolic function is moderately reduced.  3. The pericardial effusion is anterior to the right ventricle and circumferential. FINDINGS  Left Ventricle: Left ventricular ejection fraction, by estimation, is 45 to 50%. The left ventricle has mildly decreased function. The left ventricle demonstrates global hypokinesis. Left ventricular diastolic parameters are consistent with Grade II diastolic dysfunction (pseudonormalization). Right Ventricle: Limited echo for LV function. Right ventricular systolic function is moderately reduced. Pericardium: Trivial pericardial effusion is present. The pericardial effusion is anterior to the right ventricle and circumferential.  Additional Comments: A pacer wire is visualized. LEFT VENTRICLE PLAX 2D LVIDd:         4.40 cm  Diastology LVIDs:         3.80 cm  LV e' medial:    4.57 cm/s LV PW:         1.10 cm  LV E/e' medial:  15.8 LV IVS:        1.10 cm  LV e' lateral:   4.35 cm/s LVOT diam:     2.80 cm  LV E/e' lateral: 16.6 LVOT Area:     6.16 cm  RIGHT VENTRICLE RV S prime:     7.40 cm/s LEFT ATRIUM         Index LA diam:    3.60 cm 1.56 cm/m   AORTA Ao Root diam: 3.60 cm MITRAL VALVE MV Area (PHT): 3.48 cm    SHUNTS MV Decel Time: 218 msec    Systemic Diam: 2.80 cm MV E velocity: 72.40 cm/s MV A velocity: 44.60 cm/s MV E/A ratio:  1.62 Candee Furbish MD Electronically signed by Candee Furbish MD Signature Date/Time: 04/30/2020/1:53:17 PM    Final    [place] EKG SITE RITE  Result Date: 05/11/2020 If Site Rite image not attached, placement could not be confirmed due  to current cardiac rhythm.  [place] EKG SITE RITE  Result Date: 05/01/2020 If [facility] image not attached, placement could not be confirmed due to current cardiac rhythm.   Lab Data:  CBC: Recent Labs  Lab 05/17/20 1003 05/17/20 1619 05/18/20 0218 05/19/20 0224 05/20/20 0607 05/21/20 0452 05/21/20 1335 05/22/20 0527  WBC 6.7 8.0  --   --  5.1 4.3  --  6.2  HGB 6.9* 7.7*   < > 6.9* 7.2* 6.7* 7.6* 8.0*  HCT 21.8* 24.6*   < > 22.3* 23.4* 21.6* 24.3* 25.2*  MCV 91.2 91.8  --   --  91.8 91.9  --  90.0  PLT 132* 137*  --   --  121* 112*  --  125*   < > = values in this interval not displayed.   Basic Metabolic Panel: Recent Labs  Lab 05/16/20 0523 05/16/20 1300 05/18/20 0218 05/18/20 0541 05/19/20 0224 05/20/20 0511 05/21/20 0452 05/22/20 0527  NA 139   < > 139 141 139 142 142 142  K 5.2*   < > 5.2* 5.2* 4.9 4.6 4.6 4.3  CL 102   < > 103 104 102 102 102 103  CO2 27   < > _0 GLUCOSE 108*   < > 112* 128* 116* 117* 116* 112*  BUN 191*   < > 219* 217* 224* 233* 255* 270*  CREATININE 2.11*   < > 2.37* 2.28* 2.33* 2.38* 2.45* 2.53*  CALCIUM  8.7*   < > 8.9 9.0 9.0 9.2 9.2 8.9  MG 1.7  --   --   --  2.0  --   --   --   PHOS 8.2*   < > 9.4*  --  9.4* 9.6* 9.8* 10.0*   < > = values in this interval not displayed.   GFR: Estimated Creatinine Clearance: 44.6 mL/min (A) (by C-G formula based on SCr of 2.53 mg/dL (H)). Liver Function Tests: Recent Labs  Lab 05/18/20 0218 05/19/20 0224 05/20/20 0511 05/21/20 0452 05/22/20 0527  ALBUMIN 1.9* 2.2* 2.3* 2.3* 2.4*   No results for input(s): LIPASE, AMYLASE in the last 168 hours. No results for input(s): AMMONIA in the last 168 hours. Coagulation Profile: No results for input(s): INR, PROTIME in the last 168 hours. Cardiac Enzymes: No results for input(s): CKTOTAL, CKMB, CKMBINDEX, TROPONINI in the last 168 hours. BNP (last 3 results) No results for input(s): PROBNP in the last 8760 hours. HbA1C: No results for input(s): HGBA1C in the last 72 hours. CBG: Recent Labs  Lab 05/21/20 1927 05/21/20 2328 05/22/20 0308 05/22/20 0731 05/22/20 1112  GLUCAP 95 110* 109* 126* 120*   Lipid Profile: No results for input(s): CHOL, HDL, LDLCALC, TRIG, CHOLHDL, LDLDIRECT in the last 72 hours. Thyroid Function Tests: No results for input(s): TSH, T4TOTAL, FREET4, T3FREE, THYROIDAB in the last 72 hours. Anemia Panel: No results for input(s): VITAMINB12, FOLATE, FERRITIN, TIBC, IRON, RETICCTPCT in the last 72 hours. Urine analysis:    Component Value Date/Time   COLORURINE AMBER (A) 05/04/2020 1100   APPEARANCEUR TURBID (A) 05/04/2020 1100   LABSPEC 1.018 05/04/2020 1100   PHURINE 5.0 05/04/2020 1100   GLUCOSEU NEGATIVE 05/04/2020 1100   HGBUR LARGE (A) 05/04/2020 1100   BILIRUBINUR NEGATIVE 05/04/2020 1100   KETONESUR NEGATIVE 05/04/2020 1100   PROTEINUR 100 (A) 05/04/2020 1100   NITRITE NEGATIVE 05/04/2020 1100   LEUKOCYTESUR LARGE (A) 05/04/2020 1100     Bonnell Public M.D. [facility] 05/22/2020, 12:17 PM  Available via Epic secure chat 7am-7pm After 7 pm,  please refer to night coverage provider listed on amion.

## 2020-05-22 NOTE — Progress Notes (Signed)
[facility] NEPHROLOGY PROGRESS NOTE  Assessment/ Plan:  # Acute kidney Injury: Secondary to ischemic ATN from pressor requiring septic shock (with background history of chronic hypotension).  Nonoliguric and creatinine level fairly stable.  However the BUN is going up. He is not a candidate for long-term dialysis and time-limited short-term dialysis may help severe azotemia.  Also with fluid overload not really responding with IV diuretics. Discussed with the patient's HCPOA [name] about starting dialysis.  She said the patient has had dialysis in the past and then the kidney function improved.  She wants to try this time as well.  IR consulted for HD catheter placement and then possibly HD today.  # Anemia with intermittent bleeding from wounds: Secondary to platelet dysfunction in the setting of severe azotemia.  Received blood transfusion.  Hemoglobin is stable today.  # Sepsis secondary to [name] bacteremia: Status post completion of antibiotic course with meropenem and removal of Hickman catheter thought to be source of infection.  Sepsis markers have improved.  # Severe protein calorie malnutrition: Ongoing supplemental nutrition/tube feeds.  # History of diastolic heart failure: He has significant anasarca in part from his protein calorie malnutrition/low albumin and associated third spacing with preceding use of Florinef. Currently treating with Albumin/furosemide.  Recommend fluid restriction. UF with HD.   # Chronic ventilator dependent respiratory failure, quadriplegia and multiple pressure wounds/ulcers: With ongoing management for chronic pain/blood loss anemia and awaiting transfer to LTAC.  Subjective: Seen and examined in ICU.  Patient is alert awake but confused.  Urine output around 1 L.  He has anasarca.  No other event. Objective Vital signs in last 24 hours: Vitals:   05/22/20 0700 05/22/20 0749 05/22/20 0800 05/22/20 0835  BP: (!) 156/91  (!) 156/93  (!) 156/93  Pulse: 64  61 60  Resp: _0 Temp:  (!) 95.9 F (35.5 C)    TempSrc:  Axillary    SpO2: 97%  97% 95%  Weight:      Height:       Weight change: -1.2 kg  Intake/Output Summary (Last 24 hours) at 05/22/2020 0856 Last data filed at 05/22/2020 0800 Gross per 24 hour  Intake 1692.96 ml  Output 1415 ml  Net 277.96 ml       Labs: Basic Metabolic Panel: Recent Labs  Lab 05/20/20 0511 05/21/20 0452 05/22/20 0527  NA 142 142 142  K 4.6 4.6 4.3  CL 102 102 103  CO2 _1 GLUCOSE 117* 116* 112*  BUN 233* 255* 270*  CREATININE 2.38* 2.45* 2.53*  CALCIUM 9.2 9.2 8.9  PHOS 9.6* 9.8* 10.0*   Liver Function Tests: Recent Labs  Lab 05/20/20 0511 05/21/20 0452 05/22/20 0527  ALBUMIN 2.3* 2.3* 2.4*   No results for input(s): LIPASE, AMYLASE in the last 168 hours. No results for input(s): AMMONIA in the last 168 hours. CBC: Recent Labs  Lab 05/17/20 1003 05/17/20 1619 05/18/20 0218 05/20/20 0607 05/21/20 0452 05/21/20 1335 05/22/20 0527  WBC 6.7 8.0  --  5.1 4.3  --  6.2  HGB 6.9* 7.7*   < > 7.2* 6.7* 7.6* 8.0*  HCT 21.8* 24.6*   < > 23.4* 21.6* 24.3* 25.2*  MCV 91.2 91.8  --  91.8 91.9  --  90.0  PLT 132* 137*  --  121* 112*  --  125*   < > = values in this interval not displayed.   Cardiac Enzymes: No results for input(s): CKTOTAL, CKMB,  CKMBINDEX, TROPONINI in the last 168 hours. CBG: Recent Labs  Lab 05/21/20 1802 05/21/20 1927 05/21/20 2328 05/22/20 0308 05/22/20 0731  GLUCAP 99 95 110* 109* 126*    Iron Studies: No results for input(s): IRON, TIBC, TRANSFERRIN, FERRITIN in the last 72 hours. Studies/Results: No results found.  Medications: Infusions: . sodium chloride Stopped (05/21/20 1550)  . feeding supplement (NEPRO CARB STEADY) 1,000 mL (05/21/20 0800)  . furosemide Stopped (05/22/20 0610)    Scheduled Medications: . carvedilol  6.25 mg Per Tube BID WC  . chlorhexidine gluconate (MEDLINE KIT)  15 mL Mouth Rinse BID   . Chlorhexidine Gluconate Cloth  6 each Topical Daily  . Chlorhexidine Gluconate Cloth  6 each Topical Q0600  . clonazePAM  0.5 mg Per Tube BID  . darbepoetin (ARANESP) injection - NON-DIALYSIS  60 mcg Subcutaneous Q Mon-1800  . estrogens (conjugated)  5 mg Oral Daily  . feeding supplement (PROSource TF)  45 mL Per Tube 5 X Daily  . fentaNYL  1 patch Transdermal Q72H   And  . fentaNYL  1 patch Transdermal Q72H  . fluconazole  50 mg Oral Daily  . levothyroxine  125 mcg Per Tube Q0600  . mouth rinse  15 mL Mouth Rinse 10 times per day  . multivitamin with minerals  1 tablet Per Tube Daily  . nutrition supplement (JUVEN)  1 packet Per Tube BID BM  . pantoprazole sodium  40 mg Per Tube BID  . sertraline  100 mg Per Tube Daily  . sodium chloride flush  10-40 mL Intracatheter Q12H  . traZODone  100 mg Per Tube QHS    have reviewed scheduled and prn medications.  Physical Exam: General: Elderly male, ill looking lying on bed.  On vent via tracheostomy Heart:RRR, s1s2 nl, anasarca present Lungs: Basal decreased breath sound, clear anteriorly Abdomen:soft, Non-tender, distended Extremities: Upper and lower extremities edema/anasarca present Neurology: Alert, opening eyes but confused  Clara Herbison Tanna Furry 05/22/2020,8:56 AM  LOS: 23 days

## 2020-05-22 NOTE — Progress Notes (Signed)
Alerted RN Stephanie Coup this patient hemodialysis is now scheduled for tomorrow .

## 2020-05-22 NOTE — Progress Notes (Signed)
Pt still refusing dressing changes and will let you now w/his facial expressions  Has also refused oral care for most of the night.   Will allow to be turned/repositioned.

## 2020-05-22 NOTE — Procedures (Signed)
Interventional Radiology Procedure Note  Procedure: Placement of a right IJ approach temp HD catheter, 16cm.  Tip is positioned at the superior cavoatrial junction and catheter is ready for immediate use.  Complications: None Recommendations:  - Ok to use - OK to convert if need be - Do not submerge - Routine line care   Signed,  Yvone Neu. Loreta Ave, DO

## 2020-05-22 NOTE — Progress Notes (Signed)
Multiple conversations and education given with patient throughout the day, including with his POA, Inetta Fermo. Patient continuously refuses mouth care, position changes, and wound care. Patient refused bath and wound care with Inetta Fermo present, despite Inetta Fermo pleading with him.

## 2020-05-22 NOTE — Progress Notes (Signed)
Notified pt that around 0200 I would be doing a dressing change.... pt fervently refused.... Education given and why we need to change his dressing BID and pt still refuses dressing change.   Asked him if he was scared b/c it might hurt him and he said yes... Told him I give him pain medication before we do the dressing change and he still refuses.   Will try later

## 2020-05-22 NOTE — Progress Notes (Signed)
Went to reposition pt w/NT... pt was sleeping and I woke up him up to see if he would allow [place] to reposition him.   Pt said "Get the fuck out".... just to make sure, I repeated what I interpreted to what he said and pt nodded yes.   Again, education given to pt and he still refuses care, repositioning, and wound care.

## 2020-05-23 DIAGNOSIS — I959 Hypotension, unspecified: Secondary | ICD-10-CM | POA: Diagnosis not present

## 2020-05-23 DIAGNOSIS — R7881 Bacteremia: Secondary | ICD-10-CM | POA: Diagnosis not present

## 2020-05-23 DIAGNOSIS — A4159 Other Gram-negative sepsis: Secondary | ICD-10-CM | POA: Diagnosis not present

## 2020-05-23 LAB — RENAL FUNCTION PANEL
Albumin: 2.7 g/dL — ABNORMAL LOW (ref 3.5–5.0)
Albumin: 2.9 g/dL — ABNORMAL LOW (ref 3.5–5.0)
Anion gap: 14 (ref 5–15)
Anion gap: 15 (ref 5–15)
BUN: 270 mg/dL — ABNORMAL HIGH (ref 6–20)
BUN: 271 mg/dL — ABNORMAL HIGH (ref 6–20)
CO2: 22 mmol/L (ref 22–32)
CO2: 22 mmol/L (ref 22–32)
Calcium: 9.1 mg/dL (ref 8.9–10.3)
Calcium: 9.3 mg/dL (ref 8.9–10.3)
Chloride: 102 mmol/L (ref 98–111)
Chloride: 102 mmol/L (ref 98–111)
Creatinine, Ser: 2.57 mg/dL — ABNORMAL HIGH (ref 0.61–1.24)
Creatinine, Ser: 2.66 mg/dL — ABNORMAL HIGH (ref 0.61–1.24)
GFR, Estimated: 29 mL/min — ABNORMAL LOW (ref >60)
GFR, Estimated: 28 mL/min — ABNORMAL LOW (ref >60)
Glucose, Bld: 114 mg/dL — ABNORMAL HIGH (ref 70–99)
Glucose, Bld: 118 mg/dL — ABNORMAL HIGH (ref 70–99)
Phosphorus: 10.1 mg/dL — ABNORMAL HIGH (ref 2.5–4.6)
Phosphorus: 10.2 mg/dL — ABNORMAL HIGH (ref 2.5–4.6)
Potassium: 4.1 mmol/L (ref 3.5–5.1)
Potassium: 4.2 mmol/L (ref 3.5–5.1)
Sodium: 138 mmol/L (ref 135–145)
Sodium: 139 mmol/L (ref 135–145)

## 2020-05-23 LAB — CBC
HCT: 24.9 % — ABNORMAL LOW (ref 39.0–52.0)
Hemoglobin: 8 g/dL — ABNORMAL LOW (ref 13.0–17.0)
MCH: 28.8 pg (ref 26.0–34.0)
MCHC: 32.1 g/dL (ref 30.0–36.0)
MCV: 89.6 fL (ref 80.0–100.0)
Platelets: 111 10*3/uL — ABNORMAL LOW (ref 150–400)
RBC: 2.78 MIL/uL — ABNORMAL LOW (ref 4.22–5.81)
RDW: 16 % — ABNORMAL HIGH (ref 11.5–15.5)
WBC: 4.9 10*3/uL (ref 4.0–10.5)
nRBC: 0 % (ref 0.0–0.2)

## 2020-05-23 LAB — GLUCOSE, CAPILLARY
Glucose-Capillary: 104 mg/dL — ABNORMAL HIGH (ref 70–99)
Glucose-Capillary: 115 mg/dL — ABNORMAL HIGH (ref 70–99)
Glucose-Capillary: 89 mg/dL (ref 70–99)
Glucose-Capillary: 93 mg/dL (ref 70–99)
Glucose-Capillary: 94 mg/dL (ref 70–99)

## 2020-05-23 LAB — URINALYSIS, ROUTINE W REFLEX MICROSCOPIC
Bilirubin Urine: NEGATIVE
Glucose, UA: NEGATIVE mg/dL
Ketones, ur: NEGATIVE mg/dL
Nitrite: NEGATIVE
Protein, ur: 100 mg/dL — AB
RBC / HPF: >50 RBC/hpf — ABNORMAL HIGH (ref 0–5)
Specific Gravity, Urine: 1.013 (ref 1.005–1.030)
WBC, UA: >50 WBC/hpf — ABNORMAL HIGH (ref 0–5)
pH: 6 (ref 5.0–8.0)

## 2020-05-23 LAB — HEPATITIS B CORE ANTIBODY, TOTAL: Hep B Core Total Ab: NONREACTIVE

## 2020-05-23 LAB — HEPATITIS B SURFACE ANTIBODY,QUALITATIVE: Hep B S Ab: REACTIVE — AB

## 2020-05-23 LAB — HEPATITIS B SURFACE ANTIGEN: Hepatitis B Surface Ag: NONREACTIVE

## 2020-05-23 MED ORDER — CHLORHEXIDINE GLUCONATE CLOTH 2 % EX PADS
6.0000 | MEDICATED_PAD | Freq: Every day | CUTANEOUS | Status: DC
  Administered 2020-05-25 – 2020-06-03 (×3): 6 via TOPICAL

## 2020-05-23 NOTE — Progress Notes (Signed)
[facility]                                                                              Patient Demographics  Phillip Klein, is a 55 y.o. male, DOB - 05/26/1965, GZF:[id number]  Admit date - 04/23/2020   Admitting Physician Lanier Clam, MD  Outpatient Primary MD for the patient is [name], MD  Outpatient specialists:   LOS - 24  days   Medical records reviewed and are as summarized below:    Chief Complaint  Patient presents with  . Hypotension       Brief summary   55 years old male with PMH significant for chronic hypoxic respiratory failure with tracheostomy and vent since 2021 after C-spine surgery, GERD, DM, anemia of chronic disease, quadriplegia, malnutrition, neurogenic orthostatic hypotension, untreated HCV, previous IV drug use, cirrhosis, chronic pain syndrome was sent from [facility] with septic shock.  Patient admitted in the ICU with septic shock secondary to ESBL/ [name] bacteremia.  Patient had Hickman's catheter which was removed,  presumed to be the source of infection.  Patient has completed meropenem for 10 days after removal of the catheter.  Patient is having acute kidney injury which is recovering, Cardiology, Nephrology is following.  PCCM is assisting with vent management.  Patient had multiple chronic bedsores, wound care is following.  Palliative care consulted to discuss goals of care.  05/22/2020: Available records reviewed.  Discussed with the patient's nurse.  Patient seen.  Significantly elevated BUN noted.  Patient is on 2 different types of tube feeds.  Nephrology input is appreciated.  Hemodialysis is planned by nephrology team if agreeable with the patient and patient's power of attorney.  Patient is still awaiting for the wife to come so they can make a decision if to proceed with hemodialysis access placement.  05/23/2020: Patient seen alongside patient's wife.  Patient's wife updated.  Patient had  first hemodialysis today for volume and clearance.  For repeat hemodialysis tomorrow.  Nephrology team's input is appreciated.  Assessment & Plan    Principal Problem: Septic shock, present on admission secondary to [name] bacteremia, UTI versus line infection  -Hickman catheter presumed to be the source of infection, removed. -Blood cultures + [name] bacteremia, UA positive, culture showed multiple species -Completed meropenem for 10 days on 05/12/2020, repeat blood cultures negative -PICC line inserted on 3/8, venous Doppler showed no DVT. -Sepsis physiology resolved  Active problems Acute kidney injury secondary to ischemic ATN, septic shock requiring pressors, chronic hypotension, anasarca -Patient was placed on albumin and Lasix for anasarca, per nephrology has not been effective, continue Lasix -Nephrology following, not a candidate for long-term dialysis, showed however may be an option for management of severe azotemia. -Palliative medicine also following.  05/22/20: See above documentation.  Hemodialysis is planned. 05/23/2020: Patient underwent hemodialysis today with 2 L UF.  For repeat hemodialysis tomorrow.  Acute blood loss anemia with intermittent bleeding from wounds -Platelet dysfunction in the setting of severe azotemia, nephrology recommending the conjugated estrogen (Premarin) via tube for 5 days, started on 3/16 - received 1 unit packed RBCs on 3/16  -hemoglobin again down  to 6.7, will transfuse 1 unit packed RBC 05/22/2020: Hemoglobin is 8 g/dL today.  Will check stool lactoferrin to rule out GI bleed.  BUN significantly elevated, however, potassium is within normal limit.  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  Will start patient on IV albumin 25 g every 6 hourly.  Albumin is 2.4.  Patient is awaiting his wife's input so they can make the decision regarding hemodialysis access. 05/23/2020: Patient had first hemodialysis today.  For repeat hemodialysis tomorrow.  Chronic ventilator dependent respiratory failure, quadriplegia -Management per CCM, continue ventilatory support with trach -Continue feeding through tube feed when possible.  The tubes are currently clogged.  Interventional radiology team has been consulted.  Hypotension, likely orthostatic hypotension Florinef was discontinued in ICU due to concern for fluid retention, midodrine has been stopped. BP stable  Chronic pain syndrome -Currently stable, continue fentanyl patch, oxycodone  Depression, insomnia Continue sertraline, trazodone, clonazepam  GERD Continue PPI  Left arm swelling -Venous Dopplers negative for DVT  Hematuria -resolving, urology was consulted, no need for any acute intervention  Hypothermia:  -Panculture patient. -Continue antibiotics. -Continue warming blanket.  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  Obesity Estimated body mass index is 30.43 kg/m as calculated from the following:   Height as of this encounter: '6\' 2"'  (1.88 m).   Weight as of this encounter: 107.5 kg.  Code Status:  Full  DVT Prophylaxis:  SCDs Start:  04/08/2020 1811   Level of Care: Level of care: ICU Family Communication:  Disposition Plan:     Status is: Inpatient  Remains inpatient appropriate because:Inpatient level of care appropriate due to severity of illness   Dispo: The patient is from: SNF              Anticipated d/c is to: LTAC              Patient currently is not medically stable to d/c. Transfer back to [facility] or select pending bed availability and cleared by nephrology   Difficult to place patient No   Time Spent in minutes   68mns   Procedures:    Consultants:   Critical care Nephrology Palliative medicine Cardiology  Antimicrobials:   Anti-infectives (From admission, onward)   Start     Dose/Rate Route Frequency Ordered Stop   05/19/20 1000  fluconazole (DIFLUCAN) tablet 50 mg       "Followed by" Linked Group Details   50 mg Oral Daily 05/18/20 1048 05/28/20 0959   05/18/20 1145  fluconazole (DIFLUCAN) tablet 100 mg       "Followed by" Linked Group Details   100 mg Oral  Once 05/18/20 1048 05/18/20 1145   05/11/20 0800  meropenem (MERREM) 1 g in sodium chloride 0.9 % 100 mL IVPB        1  g 200 mL/hr over 30 Minutes Intravenous Every 8 hours 05/11/20 0707 05/12/20 1555   05/11/20 0715  meropenem (MERREM) 1 g in sodium chloride 0.9 % 100 mL IVPB  Status:  Discontinued        1 g 200 mL/hr over 30 Minutes Intravenous Every 8 hours 05/11/20 0706 05/11/20 0707   05/03/20 2200  meropenem (MERREM) 1 g in sodium chloride 0.9 % 100 mL IVPB  Status:  Discontinued        1 g 200 mL/hr over 30 Minutes Intravenous Every 12 hours 05/03/20 1041 05/11/20 0706   04/30/20 1145  meropenem (MERREM) 2 g in sodium chloride 0.9 % 100 mL IVPB  Status:  Discontinued        2 g 200 mL/hr over 30 Minutes Intravenous Every 12 hours 04/30/20 1052 05/03/20 1041   04/30/20 0530  ceFEPIme (MAXIPIME) 2 g in sodium chloride 0.9 % 100 mL IVPB  Status:  Discontinued        2 g 200 mL/hr over 30 Minutes Intravenous Every 12 hours  04/17/2020 1852 04/30/20 1051   05/03/2020 1852  vancomycin variable dose per unstable renal function (pharmacist dosing)  Status:  Discontinued         Does not apply See admin instructions 04/28/2020 1852 04/30/20 1051   04/13/2020 1630  vancomycin (VANCOREADY) IVPB 2000 mg/400 mL        2,000 mg 200 mL/hr over 120 Minutes Intravenous  Once 04/22/2020 1621 04/27/2020 2008   04/24/2020 1630  ceFEPIme (MAXIPIME) 2 g in sodium chloride 0.9 % 100 mL IVPB        2 g 200 mL/hr over 30 Minutes Intravenous  Once 04/14/2020 1621 04/30/2020 1749         Medications  Scheduled Meds: . carvedilol  6.25 mg Per Tube BID WC  . chlorhexidine gluconate (MEDLINE KIT)  15 mL Mouth Rinse BID  . Chlorhexidine Gluconate Cloth  6 each Topical Daily  . Chlorhexidine Gluconate Cloth  6 each Topical Q0600  . Chlorhexidine Gluconate Cloth  6 each Topical Q0600  . clonazePAM  0.5 mg Per Tube BID  . darbepoetin (ARANESP) injection - NON-DIALYSIS  60 mcg Subcutaneous Q Mon-1800  . feeding supplement (PROSource TF)  45 mL Per Tube 5 X Daily  . fentaNYL  1 patch Transdermal Q72H   And  . fentaNYL  1 patch Transdermal Q72H  . fluconazole  50 mg Oral Daily  . levothyroxine  125 mcg Per Tube Q0600  . mouth rinse  15 mL Mouth Rinse 10 times per day  . midazolam  2 mg Intravenous Once  . multivitamin with minerals  1 tablet Per Tube Daily  . nutrition supplement (JUVEN)  1 packet Per Tube BID BM  . pantoprazole sodium  40 mg Per Tube BID  . sertraline  100 mg Per Tube Daily  . sodium chloride flush  10-40 mL Intracatheter Q12H  . traZODone  100 mg Per Tube QHS   Continuous Infusions: . sodium chloride Stopped (05/21/20 1550)  . sodium chloride    . sodium chloride    . albumin human 25 g (05/23/20 1303)  . feeding supplement (NEPRO CARB STEADY) 1,000 mL (05/22/20 0955)  . furosemide 120 mg (05/23/20 1316)   PRN Meds:.sodium chloride, sodium chloride, sodium chloride, acetaminophen, albuterol, alteplase, docusate,  fentaNYL (SUBLIMAZE) injection, heparin, hydrALAZINE, lidocaine (PF), lidocaine-prilocaine, oxyCODONE, pentafluoroprop-tetrafluoroeth, polyethylene glycol, prochlorperazine, sodium chloride flush      Subjective:   Christoph Copelan was seen  and examined today.  No acute complaints.  Anasarca.  No fevers.  Hemoglobin down to 6.7 again, transfusion ongoing.   Objective:   Vitals:   05/23/20 0945 05/23/20 1000 05/23/20 1100 05/23/20 1200  BP: (!) 166/97 (!) 158/89 (!) 155/92 (!) 150/90  Pulse: (!) 53 (!) 54 (!) 58 (!) 58  Resp: (!) 0 (!) 0 (!) 0 (!) 7  Temp: (!) 93.38 F (34.1 C) (!) 93.56 F (34.2 C) (!) 94.28 F (34.6 C) (!) 95.18 F (35.1 C)  TempSrc:    Bladder  SpO2: 96% 97% 97% 96%  Weight: 107.5 kg     Height:        Intake/Output Summary (Last 24 hours) at 05/23/2020 1503 Last data filed at 05/23/2020 1200 Gross per 24 hour  Intake 911.19 ml  Output 2965 ml  Net -2053.81 ml     Wt Readings from Last 3 Encounters:  05/23/20 107.5 kg   Physical Exam  General: Patient is not in any distress.  Patient is awake and alert.  Patient is obese.    Cardiovascular: S1 S2   Respiratory: Adequate air entry.  Gastrointestinal: Obese, soft and nontender.  Colostomy bag on the left, dressing intact  Ext: bilateral heel protectors, anasarca, pitting edema  Neuro: Patient remains awake and alert.  No new deficits Musculoskeletal: Edema.  Data Reviewed:  I have personally reviewed following labs and imaging studies  Micro Results No results found for this or any previous visit (from the past 240 hour(s)).  Radiology Reports US RENAL  Result Date: 05/04/2020 CLINICAL DATA:  Renal failure. EXAM: RENAL / URINARY TRACT ULTRASOUND COMPLETE COMPARISON:  None. FINDINGS: Right Kidney: Renal measurements: 11.2 cm x 5.9 cm x 6.2 cm = volume: 216.67 mL. Echogenicity within normal limits. No mass or hydronephrosis visualized. Left Kidney: Renal measurements: 11.3 cm x 6.3 cm x 6.2  cm = volume: 231.19 mL. Echogenicity within normal limits. No mass or hydronephrosis visualized. Bladder: Not visualized. Other: None. IMPRESSION: Normal renal ultrasound. Electronically Signed   By: Virgina Norfolk M.D.   On: 05/04/2020 21:35   IR Fluoro Guide CV Line Right  Result Date: 05/22/2020 INDICATION: 55 year old male referred for hemodialysis catheter placement EXAM: IMAGE GUIDED PLACEMENT OF TEMPORARY HEMODIALYSIS CATHETER MEDICATIONS: None ANESTHESIA/SEDATION: None FLUOROSCOPY TIME:  Fluoroscopy Time: 0 minutes 6 seconds (0.7 mGy). COMPLICATIONS: None PROCEDURE: Informed written consent was obtained from the patient and the patient's family after a thorough discussion of the procedural risks, benefits and alternatives. All questions were addressed. A timeout was performed prior to the initiation of the procedure. The right neck and chest was prepped with chlorhexidine, and draped in the usual sterile fashion using maximum barrier technique (cap and mask, sterile gown, sterile gloves, large sterile sheet, hand hygiene and cutaneous antiseptic). Local anesthesia was attained by infiltration with 1% lidocaine without epinephrine. Ultrasound demonstrated patency of the right internal jugular vein, and this was documented with an image. Under real-time ultrasound guidance, this vein was accessed with a 21 gauge micropuncture needle and image documentation was performed. A small dermatotomy was made at the access site with an 11 scalpel. A 0.018" wire was advanced into the SVC and the access needle exchanged for a 105F micropuncture vascular sheath. The 0.018" wire was then removed and a 0.035" wire advanced into the IVC. Upon withdrawal of the 018 wire, the wire was marked for appropriate length of the internal portion of the catheter. A 16 cm catheter was selected. Skin and subcutaneous tissues were  serially dilated. Catheter was placed on the wire. The catheter tip is positioned in the upper right  atrium. This was documented with a spot image. Both ports of the hemodialysis catheter were then tested for excellent function. The ports were then locked with heparinized lock. Patient tolerated the procedure well and remained hemodynamically stable throughout. No complications were encountered and no significant blood loss was encountered. IMPRESSION: Status post image guided placement of temporary hemodialysis catheter. Catheter may be converted if needed. Signed, Dulcy Fanny. Dellia Nims, RPVI [facility] Radiology Electronically Signed   By: Corrie Mckusick D.O.   On: 05/22/2020 14:23   IR US Guide Vasc Access Right  Result Date: 05/22/2020 INDICATION: 55 year old male referred for hemodialysis catheter placement EXAM: IMAGE GUIDED PLACEMENT OF TEMPORARY HEMODIALYSIS CATHETER MEDICATIONS: None ANESTHESIA/SEDATION: None FLUOROSCOPY TIME:  Fluoroscopy Time: 0 minutes 6 seconds (0.7 mGy). COMPLICATIONS: None PROCEDURE: Informed written consent was obtained from the patient and the patient's family after a thorough discussion of the procedural risks, benefits and alternatives. All questions were addressed. A timeout was performed prior to the initiation of the procedure. The right neck and chest was prepped with chlorhexidine, and draped in the usual sterile fashion using maximum barrier technique (cap and mask, sterile gown, sterile gloves, large sterile sheet, hand hygiene and cutaneous antiseptic). Local anesthesia was attained by infiltration with 1% lidocaine without epinephrine. Ultrasound demonstrated patency of the right internal jugular vein, and this was documented with an image. Under real-time ultrasound guidance, this vein was accessed with a 21 gauge micropuncture needle and image documentation was performed. A small dermatotomy was made at the access site with an 11 scalpel. A 0.018" wire was advanced into the SVC and the access needle exchanged for a 13F  micropuncture vascular sheath. The 0.018" wire was then removed and a 0.035" wire advanced into the IVC. Upon withdrawal of the 018 wire, the wire was marked for appropriate length of the internal portion of the catheter. A 16 cm catheter was selected. Skin and subcutaneous tissues were serially dilated. Catheter was placed on the wire. The catheter tip is positioned in the upper right atrium. This was documented with a spot image. Both ports of the hemodialysis catheter were then tested for excellent function. The ports were then locked with heparinized lock. Patient tolerated the procedure well and remained hemodynamically stable throughout. No complications were encountered and no significant blood loss was encountered. IMPRESSION: Status post image guided placement of temporary hemodialysis catheter. Catheter may be converted if needed. Signed, Dulcy Fanny. Dellia Nims, RPVI [facility] Radiology Electronically Signed   By: Corrie Mckusick D.O.   On: 05/22/2020 14:23   DG ABDOMEN PEG TUBE LOCATION  Result Date: 04/30/2020 CLINICAL DATA:  Jejunostomy tube leak EXAM: ABDOMEN - 1 VIEW COMPARISON:  Portable exam 1103 hours without priors for comparison FINDINGS: Contrast was injected through the indwelling tube and an image was obtained. Contrast opacifies what is likely the gastric lumen. No extravasation of contrast seen. Radiopaque material within transverse and descending colon question additional recent contrast administration. Lead less pacemaker projects over cardiac silhouette. Surgical clips in LEFT pelvis. IMPRESSION: Injected contrast material opacifies what appears to be stomach rather than jejunum, question gastrostomy tube not jejunostomy tube. No extravasated contrast identified. Additional apparent contrast material within transverse colon and descending colon, question additional recent contrast administration. Electronically Signed   By: Lavonia Dana  M.D.   On: 04/30/2020 12:44   DG  CHEST PORT 1 VIEW  Result Date: 05/12/2020 CLINICAL DATA:  Shortness of breath EXAM: PORTABLE CHEST 1 VIEW COMPARISON:  None. FINDINGS: Tracheostomy device is present.  Right PICC line tip overlies SVC. Persistent bilateral pleural effusions with bibasilar atelectasis. Left lung aeration is improved. Persistent mild perihilar edema. Similar cardiomediastinal contours. IMPRESSION: Persistent bilateral pleural effusions and bibasilar atelectasis. Persistent but improved probable pulmonary edema. Electronically Signed   By: Macy Mis M.D.   On: 05/12/2020 16:53   DG CHEST PORT 1 VIEW  Result Date: 05/11/2020 CLINICAL DATA:  Shortness of breath, unable to elevate chin EXAM: PORTABLE CHEST 1 VIEW COMPARISON:  Radiograph 05/08/2020 FINDINGS: Tracheostomy tube tip terminates in the mid trachea. Right upper extremity PICC tip terminates near the superior cavoatrial junction. Telemetry leads overlie the chest. Lung volumes are diminished from comparison exam. Increasing hazy interstitial opacities with a patchy perihilar and basilar predominance and gradient density obscuring the hemidiaphragms likely reflecting worsening pulmonary edema with increasing layering bilateral effusions accentuated by low volumes. Prominence of the cardiomediastinal silhouette, poorly visualized due to overlying opacity. Chin overlies the lung apices. No acute osseous or soft tissue abnormality. IMPRESSION: Worsening pulmonary edema and bilateral effusions. Support devices as above. Electronically Signed   By: Lovena Le M.D.   On: 05/11/2020 20:36   DG Chest Port 1 View  Result Date: 05/08/2020 CLINICAL DATA:  Respiratory failure EXAM: PORTABLE CHEST 1 VIEW COMPARISON:  05/01/2020 FINDINGS: Tracheostomy is unchanged. Right internal jugular central venous catheter has been removed. Pulmonary insufflation is stable. Small bilateral pleural effusions persist with associated bibasilar compressive  atelectasis. Mild cardiomegaly is stable. Trace interstitial pulmonary edema has developed. No pneumothorax. Implanted loop recorder and cervical fusion hardware again noted. IMPRESSION: Interval removal of right internal jugular central venous catheter. Stable small bilateral pleural effusions. Interval development of mild pulmonary edema, likely cardiogenic in nature. Electronically Signed   By: Fidela Salisbury MD   On: 05/08/2020 07:12   DG CHEST PORT 1 VIEW  Result Date: 05/01/2020 CLINICAL DATA:  55 year old male status post central line placement. EXAM: PORTABLE CHEST 1 VIEW COMPARISON:  04/30/2020 portable chest. FINDINGS: Portable AP upright view at 0549 hours. Right subclavian versus IJ approach central line catheter configuration is unchanged from yesterday, the catheter tip continues across midline and terminates in the left innominate vein as before. Stable tracheostomy. Partially visible posterior cervicothoracic junction spine fusion hardware. Small cardiac event recorder or superficial ICD. Stable cardiac size and mediastinal contours. Continued veiling and confluent bibasilar opacity. No pneumothorax. Pulmonary vascularity appears mildly improved. No overt edema in the upper lungs. IMPRESSION: 1. Unchanged right side central line with tip at the left innominate vein level. Recommend repositioning to avoid innominate vein sclerosis. 2. Decreased pulmonary vascular congestion. Otherwise stable ventilation with evidence of bilateral pleural effusions with lower lobe collapse or consolidation. Electronically Signed   By: Genevie Ann M.D.   On: 05/01/2020 06:27   DG CHEST PORT 1 VIEW  Result Date: 04/30/2020 CLINICAL DATA:  Pleural effusion EXAM: PORTABLE CHEST 1 VIEW COMPARISON:  05/01/2020 FINDINGS: Tracheostomy tube terminates in the mid trachea, 4.7 cm from the carina. Right IJ approach central venous catheter courses to the level of the SVC, redirected superiorly and likely terminating within the  left brachiocephalic vein. Consider repositioning. Telemetry leads overlie the chest. Stable appearance of the chest with Veiling opacity in the mid to lower lungs with obscuration of the hemidiaphragms likely reflecting a combination of atelectasis and layering pleural effusion though underlying airspace  disease is difficult to fully exclude. Stable cardiomediastinal contours. No acute osseous or soft tissue abnormality. Prior cervical fusion incompletely assessed on this exam. IMPRESSION: 1. Veiling opacity in the mid to lower lungs likely reflecting a combination of atelectasis and layering pleural effusion. Underlying airspace disease is difficult to fully exclude. 2. Right IJ approach central venous catheter courses to the level of the SVC, redirected superiorly and likely terminating within the left brachiocephalic vein. Electronically Signed   By: Lovena Le M.D.   On: 04/30/2020 05:02   DG Chest Portable 1 View  Result Date: 04/12/2020 CLINICAL DATA:  Hypotension. EXAM: PORTABLE CHEST 1 VIEW COMPARISON:  None. FINDINGS: Dense retrocardiac opacity. Right basilar atelectasis. Possible small left pleural effusion. No visible pneumothorax. Central venous catheter tip projects left of midline, most likely in the left brachiocephalic vein. Tracheostomy tube tip projects 3.7 cm above the carina. Partially imaged cervical ACDF. Mild enlargement the cardiac silhouette. Suspected loop recorder projects over the cardiac silhouette. IMPRESSION: 1. Dense left retrocardiac opacity, concerning for left lower lobe collapse. Aspiration or pneumonia is not excluded. Recommend follow-up to resolution. 2. Possible small left pleural effusion. 3. Central venous catheter tip projects left of midline, most likely in the left brachiocephalic vein. Findings discussed with [place] via telephone at 4:35 PM. Electronically Signed   By: Margaretha Sheffield MD   On: 04/08/2020 16:38   DG Shoulder Left  Result Date:  05/12/2020 CLINICAL DATA:  Left shoulder pain and bruising. EXAM: LEFT SHOULDER - 2+ VIEW COMPARISON:  None. FINDINGS: There is no evidence of fracture or dislocation. There is no evidence of arthropathy or other focal bone abnormality. Soft tissues are unremarkable. IMPRESSION: Negative exam. Electronically Signed   By: Inge Rise M.D.   On: 05/12/2020 15:57   DG Abd Portable 1V  Addendum Date: 04/30/2020   ADDENDUM REPORT: 04/30/2020 15:30 ADDENDUM: This examination was subsequently discussed with Dr. Caron Presume. He reports that the patient has a GJ tube with a single skin entrance in the epigastric area. In light of this information, the tip of the tube is likely in the descending duodenum as correlated with the earlier study. Contrast in the stomach may relate to the previous contrast injection or reflux. No tubing is seen within the jejunum. This could be further clarified under fluoroscopy or by CT as clinically warranted. Electronically Signed   By: Richardean Sale M.D.   On: 04/30/2020 15:30   Result Date: 04/30/2020 CLINICAL DATA:  Injection of contrast into J-tube to confirm location EXAM: PORTABLE ABDOMEN - 1 VIEW COMPARISON:  Previous radiograph earlier the same date. FINDINGS: 1428 hours. The tube is unchanged from the earlier examination with the tip looped in the proximal stomach. Contrast has partially emptied from the stomach into the duodenum. Additional contrast is present in the transverse colon as noted previously. No contrast leak identified. Surgical clips are noted in the left pelvis. IMPRESSION: No significant change. The tip of the percutaneous tube is looped in the proximal stomach. Electronically Signed: By: Richardean Sale M.D. On: 04/30/2020 15:02   VAS [place] UPPER EXTREMITY VENOUS DUPLEX  Result Date: 05/11/2020 UPPER VENOUS STUDY  Indications: Swelling Comparison Study: no prior Performing Technologist: Abram Sander RVS  Examination Guidelines: A complete evaluation includes  B-mode imaging, spectral Doppler, color Doppler, and power Doppler as needed of all accessible portions of each vessel. Bilateral testing is considered an integral part of a complete examination. Limited examinations for reoccurring indications may be performed as noted.  Left  Findings: +----------+------------+---------+-----------+----------+--------------+ LEFT      CompressiblePhasicitySpontaneousProperties   Summary     +----------+------------+---------+-----------+----------+--------------+ IJV                                                 Not visualized +----------+------------+---------+-----------+----------+--------------+ Subclavian    Full       Yes       Yes                             +----------+------------+---------+-----------+----------+--------------+ Axillary      Full       Yes       Yes                             +----------+------------+---------+-----------+----------+--------------+ Brachial      Full       Yes       Yes                             +----------+------------+---------+-----------+----------+--------------+ Radial        Full                                                 +----------+------------+---------+-----------+----------+--------------+ Ulnar         Full                                                 +----------+------------+---------+-----------+----------+--------------+ Cephalic      None                                                 +----------+------------+---------+-----------+----------+--------------+ Basilic       Full                                                 +----------+------------+---------+-----------+----------+--------------+  Summary:  Left: No evidence of deep vein thrombosis in the upper extremity. Findings consistent with age indeterminate superficial vein thrombosis involving the left cephalic vein.  *See table(s) above for measurements and observations.  Diagnosing  physician: Deitra Mayo MD Electronically signed by Deitra Mayo MD on 05/11/2020 at 5:02:39 PM.    Final    ECHOCARDIOGRAM LIMITED  Result Date: 05/14/2020    ECHOCARDIOGRAM LIMITED REPORT   Patient Name:   GRASON Hewes Date of Exam: 05/14/2020 Medical Rec #:  [id number]        Height:       74.0 in Accession #:    [id number]       Weight:       251.1 lb Date of Birth:  31-May-1965        BSA:          2.394 m Patient Age:  54 years         BP:           153/88 mmHg Patient Gender: M                HR:           83 bpm. Exam Location:  Inpatient Procedure: Limited Echo, Limited Color Doppler and Cardiac Doppler Indications:    CHF-Acute Diastolic J03.15  History:        Patient has prior history of Echocardiogram examinations, most                 recent 04/30/2020. Pacemaker; Risk Factors:Diabetes.  Sonographer:    Mikki Santee RDCS (AE) Referring Phys: [place]  1. Left ventricular ejection fraction, by estimation, is 55%. The left ventricle has normal function. Left ventricular diastolic parameters were normal.  2. Right ventricular systolic function is normal. The right ventricular size is moderately enlarged. A leadless pacemaker is visualized.  3. Trivial mitral valve regurgitation. FINDINGS  Left Ventricle: Left ventricular ejection fraction, by estimation, is 55%. The left ventricle has normal function. Left ventricular diastolic parameters were normal. Right Ventricle: The right ventricular size is moderately enlarged. Right ventricular systolic function is normal. Mitral Valve: Trivial mitral valve regurgitation. Additional Comments: A leadless pacemaker is visualized.  Diastology LV e' medial:    9.57 cm/s LV E/e' medial:  10.0 LV e' lateral:   14.90 cm/s LV E/e' lateral: 6.4  MITRAL VALVE MV Area (PHT): 3.77 cm MV Decel Time: 201 msec MV E velocity: 95.70 cm/s MV A velocity: 65.60 cm/s MV E/A ratio:  1.46 Cherlynn Kaiser MD Electronically signed by Cherlynn Kaiser MD Signature Date/Time: 05/14/2020/12:56:19 PM    Final    ECHOCARDIOGRAM LIMITED  Result Date: 04/30/2020    ECHOCARDIOGRAM LIMITED REPORT   Patient Name:   MARCELL Berberian Date of Exam: 04/30/2020 Medical Rec #:  [id number]        Height:       74.0 in Accession #:    [id number]       Weight:       229.9 lb Date of Birth:  [birth year]        BSA:          2.306 m Patient Age:    38 years         BP:           100/62 mmHg Patient Gender: M                HR:           80 bpm. Exam Location:  Inpatient Procedure: Limited Echo, Cardiac Doppler and Color Doppler Indications:    CHF  History:        Patient has no prior history of Echocardiogram examinations.                 CHF. Septic shock, Resp, failure, IVDU, cirrhosis, quadriplegia.  Sonographer:    Dustin Flock Referring Phys: [phone] [place]  1. Left ventricular ejection fraction, by estimation, is 45 to 50%. The left ventricle has mildly decreased function. The left ventricle demonstrates global hypokinesis. Left ventricular diastolic parameters are consistent with Grade II diastolic dysfunction (pseudonormalization).  2. Right ventricular systolic function is moderately reduced.  3. The pericardial effusion is anterior to the right ventricle and circumferential. FINDINGS  Left Ventricle: Left ventricular ejection fraction, by estimation, is 45 to 50%. The left ventricle has  mildly decreased function. The left ventricle demonstrates global hypokinesis. Left ventricular diastolic parameters are consistent with Grade II diastolic dysfunction (pseudonormalization). Right Ventricle: Limited echo for LV function. Right ventricular systolic function is moderately reduced. Pericardium: Trivial pericardial effusion is present. The pericardial effusion is anterior to the right ventricle and circumferential. Additional Comments: A pacer wire is visualized. LEFT VENTRICLE PLAX 2D LVIDd:         4.40 cm  Diastology LVIDs:         3.80 cm   LV e' medial:    4.57 cm/s LV PW:         1.10 cm  LV E/e' medial:  15.8 LV IVS:        1.10 cm  LV e' lateral:   4.35 cm/s LVOT diam:     2.80 cm  LV E/e' lateral: 16.6 LVOT Area:     6.16 cm  RIGHT VENTRICLE RV S prime:     7.40 cm/s LEFT ATRIUM         Index LA diam:    3.60 cm 1.56 cm/m   AORTA Ao Root diam: 3.60 cm MITRAL VALVE MV Area (PHT): 3.48 cm    SHUNTS MV Decel Time: 218 msec    Systemic Diam: 2.80 cm MV E velocity: 72.40 cm/s MV A velocity: 44.60 cm/s MV E/A ratio:  1.62 Candee Furbish MD Electronically signed by Candee Furbish MD Signature Date/Time: 04/30/2020/1:53:17 PM    Final    [place] EKG SITE RITE  Result Date: 05/11/2020 If Site Rite image not attached, placement could not be confirmed due to current cardiac rhythm.  [place] EKG SITE RITE  Result Date: 05/01/2020 If [facility] image not attached, placement could not be confirmed due to current cardiac rhythm.   Lab Data:  CBC: Recent Labs  Lab 05/20/20 0607 05/21/20 0452 05/21/20 1335 05/22/20 0527 05/22/20 1658 05/23/20 0659  WBC 5.1 4.3  --  6.2 5.0 4.9  HGB 7.2* 6.7* 7.6* 8.0* 7.8* 8.0*  HCT 23.4* 21.6* 24.3* 25.2* 25.4* 24.9*  MCV 91.8 91.9  --  90.0 91.7 89.6  PLT 121* 112*  --  125* 120* 375*   Basic Metabolic Panel: Recent Labs  Lab 05/19/20 0224 05/20/20 0511 05/21/20 0452 05/22/20 0527 05/22/20 1658 05/23/20 0531 05/23/20 0659  NA 139   < > 142 142 139 138 139  K 4.9   < > 4.6 4.3 4.5 4.1 4.2  CL 102   < > 102 103 101 102 102  CO2 25   < > '25 23 22 22 22  ' GLUCOSE 116*   < > 116* 112* 148* 114* 118*  BUN 224*   < > 255* 270* 275* 270* 271*  CREATININE 2.33*   < > 2.45* 2.53* 2.70* 2.57* 2.66*  CALCIUM 9.0   < > 9.2 8.9 8.9 9.1 9.3  MG 2.0  --   --   --   --   --   --   PHOS 9.4*   < > 9.8* 10.0* 10.2* 10.1* 10.2*   < > = values in this interval not displayed.   GFR: Estimated Creatinine Clearance: 42.4 mL/min (A) (by C-G formula based on SCr of 2.66 mg/dL (H)). Liver Function Tests: Recent Labs   Lab 05/21/20 0452 05/22/20 0527 05/22/20 1658 05/23/20 0531 05/23/20 0659  ALBUMIN 2.3* 2.4* 2.3* 2.7* 2.9*   No results for input(s): LIPASE, AMYLASE in the last 168 hours. No results for input(s): AMMONIA in the last 168 hours.  Coagulation Profile: No results for input(s): INR, PROTIME in the last 168 hours. Cardiac Enzymes: No results for input(s): CKTOTAL, CKMB, CKMBINDEX, TROPONINI in the last 168 hours. BNP (last 3 results) No results for input(s): PROBNP in the last 8760 hours. HbA1C: No results for input(s): HGBA1C in the last 72 hours. CBG: Recent Labs  Lab 05/22/20 1509 05/22/20 2008 05/22/20 2339 05/23/20 0433 05/23/20 1138  GLUCAP 118* 112* 123* 115* 93   Lipid Profile: No results for input(s): CHOL, HDL, LDLCALC, TRIG, CHOLHDL, LDLDIRECT in the last 72 hours. Thyroid Function Tests: No results for input(s): TSH, T4TOTAL, FREET4, T3FREE, THYROIDAB in the last 72 hours. Anemia Panel: No results for input(s): VITAMINB12, FOLATE, FERRITIN, TIBC, IRON, RETICCTPCT in the last 72 hours. Urine analysis:    Component Value Date/Time   COLORURINE YELLOW 05/23/2020 0931   APPEARANCEUR HAZY (A) 05/23/2020 0931   LABSPEC 1.013 05/23/2020 0931   PHURINE 6.0 05/23/2020 0931   GLUCOSEU NEGATIVE 05/23/2020 0931   HGBUR LARGE (A) 05/23/2020 0931   BILIRUBINUR NEGATIVE 05/23/2020 0931   KETONESUR NEGATIVE 05/23/2020 0931   PROTEINUR 100 (A) 05/23/2020 0931   NITRITE NEGATIVE 05/23/2020 0931   LEUKOCYTESUR LARGE (A) 05/23/2020 8756     Bonnell Public M.D. [facility] 05/23/2020, 3:03 PM  Available via Epic secure chat 7am-7pm After 7 pm, please refer to night coverage provider listed on amion.

## 2020-05-23 NOTE — Progress Notes (Signed)
Pt refused mouth care. Will try again

## 2020-05-23 NOTE — Plan of Care (Signed)
  Problem: Education: Goal: Knowledge of General Education information will improve Description: Including pain rating scale, medication(s)/side effects and non-pharmacologic comfort measures Outcome: Not Progressing   Problem: Health Behavior/Discharge Planning: Goal: Ability to manage health-related needs will improve Outcome: Not Progressing   Problem: Activity: Goal: Risk for activity intolerance will decrease Outcome: Not Progressing   Problem: Coping: Goal: Level of anxiety will decrease Outcome: Not Progressing   Problem: Skin Integrity: Goal: Risk for impaired skin integrity will decrease Outcome: Not Progressing  Refusing aspects of care, educated the need of care plans Esperanza Sheets, RN 05/23/2020 10:52 PM

## 2020-05-23 NOTE — Progress Notes (Signed)
Spoke with Inetta Fermo, pt's POA. She was informed that the pt has been being ventricular paced more frequently on the monitor, HR has been around 48-55 bpm and pt's temp is 92.3. Bair hugger is applied. All questions were answered. Inetta Fermo said she is coming in about an hour.

## 2020-05-23 NOTE — Progress Notes (Signed)
Dr. Ronalee Belts at bedside made aware of pt. Low temperatures and pt. Paced HR as documented. Primary nurse initiated Bair-Hugger. Dr. Ronalee Belts orders to increase UF goal to 2L net as tolerated. Pt. Stable alert and oriented

## 2020-05-23 NOTE — Progress Notes (Signed)
[facility] NEPHROLOGY PROGRESS NOTE  Assessment/ Plan:  # Acute kidney Injury: Secondary to ischemic ATN from pressor requiring septic shock (with background history of chronic hypotension).  Starting dialysis today for fluid overload refractory to IV diuretics and azotemia. As per patient's Phillip Klein, the patient has had dialysis in the past and then the kidney function improved.  She wants to try this time as well.  She is aware that patient is not a candidate for long-term outpatient dialysis because of poor functional status and multiple comorbidities.  IR placed temporary HD catheter on 3/19.  Tolerating HD well today.  Plan for another treatment tomorrow.    # Anemia with intermittent bleeding from wounds: Secondary to platelet dysfunction in the setting of severe azotemia.  Received blood transfusion.  Hemoglobin is stable today.  # Sepsis secondary to [name] bacteremia: Status post completion of antibiotic course with meropenem and removal of Hickman catheter thought to be source of infection.  Sepsis markers have improved.  # Severe protein calorie malnutrition: Ongoing supplemental nutrition/tube feeds.  # History of diastolic heart failure/anasarca: He has significant anasarca in part from his protein calorie malnutrition/low albumin and associated third spacing with preceding use of Florinef.   Treating with Albumin/furosemide, fluid restriction. UF with HD.   # Chronic ventilator dependent respiratory failure, quadriplegia and multiple pressure wounds/ulcers: With ongoing management for chronic pain/blood loss anemia and awaiting transfer to LTAC.  Subjective: Seen and examined in ICU.  First dialysis today.  He is tolerating well so far.  Given anasarca and acceptable blood pressure with the goal UF increased to 2 L.  Patient is alert awake.  Urine outputs around 700 cc. Objective Vital signs in last 24 hours: Vitals:   05/23/20 0915 05/23/20 0930 05/23/20  0935 05/23/20 0945  BP: 135/85 136/89  (!) 166/97  Pulse: (!) 50 (!) 53 (!) 49 (!) 53  Resp: (!) 0 (!) 0 (!) 0 (!) 0  Temp: (!) 93.2 F (34 C) (!) 93.2 F (34 C) (!) 93.38 F (34.1 C) (!) 93.38 F (34.1 C)  TempSrc:      SpO2: 95% 95% 95% 96%  Weight:    107.5 kg  Height:       Weight change: 1.8 kg  Intake/Output Summary (Last 24 hours) at 05/23/2020 0948 Last data filed at 05/23/2020 0935 Gross per 24 hour  Intake 1136.6 ml  Output 3045 ml  Net -1908.4 ml       Labs: Basic Metabolic Panel: Recent Labs  Lab 05/22/20 1658 05/23/20 0531 05/23/20 0659  NA 139 138 139  K 4.5 4.1 4.2  CL 101 102 102  CO2 _0 GLUCOSE 148* 114* 118*  BUN 275* 270* 271*  CREATININE 2.70* 2.57* 2.66*  CALCIUM 8.9 9.1 9.3  PHOS 10.2* 10.1* 10.2*   Liver Function Tests: Recent Labs  Lab 05/22/20 1658 05/23/20 0531 05/23/20 0659  ALBUMIN 2.3* 2.7* 2.9*   No results for input(s): LIPASE, AMYLASE in the last 168 hours. No results for input(s): AMMONIA in the last 168 hours. CBC: Recent Labs  Lab 05/20/20 0607 05/21/20 0452 05/21/20 1335 05/22/20 0527 05/22/20 1658 05/23/20 0659  WBC 5.1 4.3  --  6.2 5.0 4.9  HGB 7.2* 6.7*   < > 8.0* 7.8* 8.0*  HCT 23.4* 21.6*   < > 25.2* 25.4* 24.9*  MCV 91.8 91.9  --  90.0 91.7 89.6  PLT 121* 112*  --  125* 120* 111*   < > =  values in this interval not displayed.   Cardiac Enzymes: No results for input(s): CKTOTAL, CKMB, CKMBINDEX, TROPONINI in the last 168 hours. CBG: Recent Labs  Lab 05/22/20 1112 05/22/20 1509 05/22/20 2008 05/22/20 2339 05/23/20 0433  GLUCAP 120* 118* 112* 123* 115*    Iron Studies: No results for input(s): IRON, TIBC, TRANSFERRIN, FERRITIN in the last 72 hours. Studies/Results: IR Fluoro Guide CV Line Right  Result Date: 05/22/2020 INDICATION: 55 year old male referred for hemodialysis catheter placement EXAM: IMAGE GUIDED PLACEMENT OF TEMPORARY HEMODIALYSIS CATHETER MEDICATIONS: None  ANESTHESIA/SEDATION: None FLUOROSCOPY TIME:  Fluoroscopy Time: 0 minutes 6 seconds (0.7 mGy). COMPLICATIONS: None PROCEDURE: Informed written consent was obtained from the patient and the patient's family after a thorough discussion of the procedural risks, benefits and alternatives. All questions were addressed. A timeout was performed prior to the initiation of the procedure. The right neck and chest was prepped with chlorhexidine, and draped in the usual sterile fashion using maximum barrier technique (cap and mask, sterile gown, sterile gloves, large sterile sheet, hand hygiene and cutaneous antiseptic). Local anesthesia was attained by infiltration with 1% lidocaine without epinephrine. Ultrasound demonstrated patency of the right internal jugular vein, and this was documented with an image. Under real-time ultrasound guidance, this vein was accessed with a 21 gauge micropuncture needle and image documentation was performed. A small dermatotomy was made at the access site with an 11 scalpel. A 0.018" wire was advanced into the SVC and the access needle exchanged for a 60F micropuncture vascular sheath. The 0.018" wire was then removed and a 0.035" wire advanced into the IVC. Upon withdrawal of the 018 wire, the wire was marked for appropriate length of the internal portion of the catheter. A 16 cm catheter was selected. Skin and subcutaneous tissues were serially dilated. Catheter was placed on the wire. The catheter tip is positioned in the upper right atrium. This was documented with a spot image. Both ports of the hemodialysis catheter were then tested for excellent function. The ports were then locked with heparinized lock. Patient tolerated the procedure well and remained hemodynamically stable throughout. No complications were encountered and no significant blood loss was encountered. IMPRESSION: Status post image guided placement of temporary hemodialysis catheter. Catheter may be converted if needed.  Signed, Dulcy Fanny. Dellia Nims, RPVI [facility] Radiology Electronically Signed   By: Corrie Mckusick D.O.   On: 05/22/2020 14:23   IR US Guide Vasc Access Right  Result Date: 05/22/2020 INDICATION: 55 year old male referred for hemodialysis catheter placement EXAM: IMAGE GUIDED PLACEMENT OF TEMPORARY HEMODIALYSIS CATHETER MEDICATIONS: None ANESTHESIA/SEDATION: None FLUOROSCOPY TIME:  Fluoroscopy Time: 0 minutes 6 seconds (0.7 mGy). COMPLICATIONS: None PROCEDURE: Informed written consent was obtained from the patient and the patient's family after a thorough discussion of the procedural risks, benefits and alternatives. All questions were addressed. A timeout was performed prior to the initiation of the procedure. The right neck and chest was prepped with chlorhexidine, and draped in the usual sterile fashion using maximum barrier technique (cap and mask, sterile gown, sterile gloves, large sterile sheet, hand hygiene and cutaneous antiseptic). Local anesthesia was attained by infiltration with 1% lidocaine without epinephrine. Ultrasound demonstrated patency of the right internal jugular vein, and this was documented with an image. Under real-time ultrasound guidance, this vein was accessed with a 21 gauge micropuncture needle and image documentation was performed. A small dermatotomy was made at the access site with an 11 scalpel. A 0.018" wire was advanced into the  SVC and the access needle exchanged for a 10F micropuncture vascular sheath. The 0.018" wire was then removed and a 0.035" wire advanced into the IVC. Upon withdrawal of the 018 wire, the wire was marked for appropriate length of the internal portion of the catheter. A 16 cm catheter was selected. Skin and subcutaneous tissues were serially dilated. Catheter was placed on the wire. The catheter tip is positioned in the upper right atrium. This was documented with a spot image. Both ports of the  hemodialysis catheter were then tested for excellent function. The ports were then locked with heparinized lock. Patient tolerated the procedure well and remained hemodynamically stable throughout. No complications were encountered and no significant blood loss was encountered. IMPRESSION: Status post image guided placement of temporary hemodialysis catheter. Catheter may be converted if needed. Signed, Dulcy Fanny. Dellia Nims, RPVI [facility] Electronically Signed   By: Corrie Mckusick D.O.   On: 05/22/2020 14:23    Medications: Infusions: . sodium chloride Stopped (05/21/20 1550)  . sodium chloride    . sodium chloride    . albumin human 60 mL/hr at 05/23/20 0600  . feeding supplement (NEPRO CARB STEADY) 1,000 mL (05/22/20 0955)  . furosemide 62 mL/hr at 05/23/20 0600    Scheduled Medications: . carvedilol  6.25 mg Per Tube BID WC  . chlorhexidine gluconate (MEDLINE KIT)  15 mL Mouth Rinse BID  . Chlorhexidine Gluconate Cloth  6 each Topical Daily  . Chlorhexidine Gluconate Cloth  6 each Topical Q0600  . clonazePAM  0.5 mg Per Tube BID  . darbepoetin (ARANESP) injection - NON-DIALYSIS  60 mcg Subcutaneous Q Mon-1800  . estrogens (conjugated)  5 mg Oral Daily  . feeding supplement (PROSource TF)  45 mL Per Tube 5 X Daily  . fentaNYL  1 patch Transdermal Q72H   And  . fentaNYL  1 patch Transdermal Q72H  . fluconazole  50 mg Oral Daily  . levothyroxine  125 mcg Per Tube Q0600  . mouth rinse  15 mL Mouth Rinse 10 times per day  . midazolam  2 mg Intravenous Once  . multivitamin with minerals  1 tablet Per Tube Daily  . nutrition supplement (JUVEN)  1 packet Per Tube BID BM  . pantoprazole sodium  40 mg Per Tube BID  . sertraline  100 mg Per Tube Daily  . sodium chloride flush  10-40 mL Intracatheter Q12H  . traZODone  100 mg Per Tube QHS    have reviewed scheduled and prn medications.  Physical Exam: General: Elderly male, ill  looking lying on bed.  On vent via tracheostomy Heart:RRR, s1s2 nl, anasarca present Lungs: Basal decreased breath sound, clear anteriorly Abdomen:soft, Non-tender, distended Extremities: Upper and lower extremities edema/anasarca present Neurology: He is alert awake and trying to speak. Dialysis Access: Right IJ temporary HD catheter site clean.  Phillip Klein Prasad Yazaira Speas 05/23/2020,9:48 AM  LOS: 24 days

## 2020-05-24 DIAGNOSIS — N1832 Chronic kidney disease, stage 3b: Secondary | ICD-10-CM | POA: Diagnosis not present

## 2020-05-24 DIAGNOSIS — E43 Unspecified severe protein-calorie malnutrition: Secondary | ICD-10-CM | POA: Diagnosis not present

## 2020-05-24 DIAGNOSIS — N170 Acute kidney failure with tubular necrosis: Secondary | ICD-10-CM | POA: Diagnosis not present

## 2020-05-24 DIAGNOSIS — I959 Hypotension, unspecified: Secondary | ICD-10-CM | POA: Diagnosis not present

## 2020-05-24 DIAGNOSIS — R601 Generalized edema: Secondary | ICD-10-CM | POA: Diagnosis not present

## 2020-05-24 DIAGNOSIS — R7881 Bacteremia: Secondary | ICD-10-CM | POA: Diagnosis not present

## 2020-05-24 DIAGNOSIS — A4159 Other Gram-negative sepsis: Secondary | ICD-10-CM | POA: Diagnosis not present

## 2020-05-24 LAB — GLUCOSE, CAPILLARY
Glucose-Capillary: 94 mg/dL (ref 70–99)
Glucose-Capillary: 92 mg/dL (ref 70–99)
Glucose-Capillary: 89 mg/dL (ref 70–99)
Glucose-Capillary: 86 mg/dL (ref 70–99)

## 2020-05-24 LAB — COMPREHENSIVE METABOLIC PANEL
ALT: 20 U/L (ref 0–44)
AST: 30 U/L (ref 15–41)
Albumin: 3.4 g/dL — ABNORMAL LOW (ref 3.5–5.0)
Alkaline Phosphatase: 222 U/L — ABNORMAL HIGH (ref 38–126)
Anion gap: 17 — ABNORMAL HIGH (ref 5–15)
BUN: 269 mg/dL — ABNORMAL HIGH (ref 6–20)
CO2: 22 mmol/L (ref 22–32)
Calcium: 9.2 mg/dL (ref 8.9–10.3)
Chloride: 101 mmol/L (ref 98–111)
Creatinine, Ser: 2.73 mg/dL — ABNORMAL HIGH (ref 0.61–1.24)
GFR, Estimated: 27 mL/min — ABNORMAL LOW (ref >60)
Glucose, Bld: 97 mg/dL (ref 70–99)
Potassium: 4.1 mmol/L (ref 3.5–5.1)
Sodium: 140 mmol/L (ref 135–145)
Total Bilirubin: 1.5 mg/dL — ABNORMAL HIGH (ref 0.3–1.2)
Total Protein: 7.8 g/dL (ref 6.5–8.1)

## 2020-05-24 LAB — PHOSPHORUS: Phosphorus: 10.6 mg/dL — ABNORMAL HIGH (ref 2.5–4.6)

## 2020-05-24 LAB — LACTOFERRIN, FECAL, QUALITATIVE: Lactoferrin, Fecal, Qual: POSITIVE — AB

## 2020-05-24 LAB — URINE CULTURE

## 2020-05-24 MED ORDER — LORAZEPAM 2 MG/ML IJ SOLN
1.0000 mg | Freq: Four times a day (QID) | INTRAMUSCULAR | Status: DC | PRN
  Administered 2020-05-24 – 2020-05-31 (×11): 1 mg via INTRAVENOUS
  Filled 2020-05-24 (×12): qty 1

## 2020-05-24 MED ORDER — KETAMINE HCL 50 MG/5ML IJ SOSY
PREFILLED_SYRINGE | INTRAMUSCULAR | Status: AC
  Filled 2020-05-24: qty 5

## 2020-05-24 MED ORDER — MIDAZOLAM HCL 2 MG/2ML IJ SOLN
2.0000 mg | Freq: Once | INTRAMUSCULAR | Status: AC
  Administered 2020-05-24: 2 mg via INTRAVENOUS

## 2020-05-24 MED ORDER — ETOMIDATE 2 MG/ML IV SOLN
INTRAVENOUS | Status: AC
  Filled 2020-05-24: qty 20

## 2020-05-24 MED ORDER — OSMOLITE 1.5 CAL PO LIQD
1000.0000 mL | ORAL | Status: DC
  Administered 2020-05-27 – 2020-06-06 (×13): 1000 mL
  Filled 2020-05-24 (×24): qty 1000

## 2020-05-24 MED ORDER — ROCURONIUM BROMIDE 10 MG/ML (PF) SYRINGE
PREFILLED_SYRINGE | INTRAVENOUS | Status: AC
  Filled 2020-05-24: qty 10

## 2020-05-24 MED ORDER — DARBEPOETIN ALFA 60 MCG/0.3ML IJ SOSY
60.0000 ug | PREFILLED_SYRINGE | INTRAMUSCULAR | Status: DC
  Administered 2020-05-24: 60 ug via INTRAVENOUS
  Filled 2020-05-24 (×2): qty 0.3

## 2020-05-24 MED ORDER — FENTANYL CITRATE (PF) 100 MCG/2ML IJ SOLN
INTRAMUSCULAR | Status: AC
  Filled 2020-05-24: qty 2

## 2020-05-24 MED ORDER — MIDAZOLAM HCL 2 MG/2ML IJ SOLN
INTRAMUSCULAR | Status: AC
  Filled 2020-05-24: qty 2

## 2020-05-24 MED ORDER — FENTANYL CITRATE (PF) 100 MCG/2ML IJ SOLN
100.0000 ug | Freq: Once | INTRAMUSCULAR | Status: AC
Start: 2020-05-24 — End: 2020-05-24
  Administered 2020-05-24: 100 ug via INTRAVENOUS

## 2020-05-24 NOTE — Progress Notes (Signed)
Called to bring pt down for GJ tube change. Per nurse pt unable to come down now due to dressing changes and pt is to go to dialysis later. Per RN, ok for exchange to be done tomorrow.

## 2020-05-24 NOTE — Progress Notes (Signed)
[facility] NEPHROLOGY PROGRESS NOTE  Assessment/ Plan:  # Acute kidney Injury: Secondary to ischemic ATN from pressor requiring septic shock (with background history of chronic hypotension).  Started dialysis for fluid overload refractory to IV diuretics and azotemia on 3/20. Temporary catheter placed with IR on 3/19 As per patient's [facility], the patient has had dialysis in the past and then the kidney function improved.  She wants to try this time as well.  She is aware that patient is not a candidate for long-term outpatient dialysis because of poor functional status and multiple comorbidities.  - IHD again today, hold tomorrow and reassess the tentative plans on doing HD again on Wed (ongoing [place] discussions in the interim).  # Anemia with intermittent bleeding from wounds: Secondary to platelet dysfunction in the setting of severe azotemia.  Received blood transfusion.  Hemoglobin is stable today.  # Sepsis secondary to [name] bacteremia: Status post completion of antibiotic course with meropenem and removal of Hickman catheter thought to be source of infection.  Sepsis markers have improved.  # Severe protein calorie malnutrition: Ongoing supplemental nutrition/tube feeds.  # History of diastolic heart failure/anasarca: He has significant anasarca in part from his protein calorie malnutrition/low albumin and associated third spacing with preceding use of Florinef.   Treating with Albumin/furosemide, fluid restriction. UF with HD.   # Chronic ventilator dependent respiratory failure, quadriplegia and multiple pressure wounds/ulcers: With ongoing management for chronic pain/blood loss anemia and awaiting transfer to LTAC.  Subjective: Seen and examined in ICU. Tolerated HD yesterday, net uf 2L. UOP 720cc.  Patient is alert awake.  There was a concern for increased O2 needs with the possiblity that his trach is too large (per staff).  Objective Vital signs in last 24  hours: Vitals:   05/24/20 1030 05/24/20 1045 05/24/20 1100 05/24/20 1119  BP: (!) 150/95 (!) 150/91 (!) 150/94   Pulse: 64 64 63 64  Resp: 20 16 (!) 6 18  Temp: (!) 94.46 F (34.7 C) (!) 94.46 F (34.7 C) (!) 94.46 F (34.7 C) (!) 94.46 F (34.7 C)  TempSrc:      SpO2: 92% 93% 93% 91%  Weight:      Height:       Weight change: -2 kg  Intake/Output Summary (Last 24 hours) at 05/24/2020 1142 Last data filed at 05/24/2020 1034 Gross per 24 hour  Intake 695.1 ml  Output 600 ml  Net 95.1 ml       Labs: Basic Metabolic Panel: Recent Labs  Lab 05/23/20 0531 05/23/20 0659 05/24/20 0500  NA 138 139 140  K 4.1 4.2 4.1  CL 102 102 101  CO2 '22 22 22  ' GLUCOSE 114* 118* 97  BUN 270* 271* 269*  CREATININE 2.57* 2.66* 2.73*  CALCIUM 9.1 9.3 9.2  PHOS 10.1* 10.2* 10.6*   Liver Function Tests: Recent Labs  Lab 05/23/20 0531 05/23/20 0659 05/24/20 0500  AST  --   --  30  ALT  --   --  20  ALKPHOS  --   --  222*  BILITOT  --   --  1.5*  PROT  --   --  7.8  ALBUMIN 2.7* 2.9* 3.4*   No results for input(s): LIPASE, AMYLASE in the last 168 hours. No results for input(s): AMMONIA in the last 168 hours. CBC: Recent Labs  Lab 05/20/20 0607 05/21/20 0452 05/21/20 1335 05/22/20 0527 05/22/20 1658 05/23/20 0659  WBC 5.1 4.3  --  6.2 5.0  4.9  HGB 7.2* 6.7*   < > 8.0* 7.8* 8.0*  HCT 23.4* 21.6*   < > 25.2* 25.4* 24.9*  MCV 91.8 91.9  --  90.0 91.7 89.6  PLT 121* 112*  --  125* 120* 111*   < > = values in this interval not displayed.   Cardiac Enzymes: No results for input(s): CKTOTAL, CKMB, CKMBINDEX, TROPONINI in the last 168 hours. CBG: Recent Labs  Lab 05/23/20 1956 05/23/20 2345 05/24/20 0406 05/24/20 0732 05/24/20 1122  GLUCAP 104* 89 94 92 89    Iron Studies: No results for input(s): IRON, TIBC, TRANSFERRIN, FERRITIN in the last 72 hours. Studies/Results: IR Fluoro Guide CV Line Right  Result Date: 05/22/2020 INDICATION: 55 year old male referred for  hemodialysis catheter placement EXAM: IMAGE GUIDED PLACEMENT OF TEMPORARY HEMODIALYSIS CATHETER MEDICATIONS: None ANESTHESIA/SEDATION: None FLUOROSCOPY TIME:  Fluoroscopy Time: 0 minutes 6 seconds (0.7 mGy). COMPLICATIONS: None PROCEDURE: Informed written consent was obtained from the patient and the patient's family after a thorough discussion of the procedural risks, benefits and alternatives. All questions were addressed. A timeout was performed prior to the initiation of the procedure. The right neck and chest was prepped with chlorhexidine, and draped in the usual sterile fashion using maximum barrier technique (cap and mask, sterile gown, sterile gloves, large sterile sheet, hand hygiene and cutaneous antiseptic). Local anesthesia was attained by infiltration with 1% lidocaine without epinephrine. Ultrasound demonstrated patency of the right internal jugular vein, and this was documented with an image. Under real-time ultrasound guidance, this vein was accessed with a 21 gauge micropuncture needle and image documentation was performed. A small dermatotomy was made at the access site with an 11 scalpel. A 0.018" wire was advanced into the SVC and the access needle exchanged for a 1F micropuncture vascular sheath. The 0.018" wire was then removed and a 0.035" wire advanced into the IVC. Upon withdrawal of the 018 wire, the wire was marked for appropriate length of the internal portion of the catheter. A 16 cm catheter was selected. Skin and subcutaneous tissues were serially dilated. Catheter was placed on the wire. The catheter tip is positioned in the upper right atrium. This was documented with a spot image. Both ports of the hemodialysis catheter were then tested for excellent function. The ports were then locked with heparinized lock. Patient tolerated the procedure well and remained hemodynamically stable throughout. No complications were encountered and no significant blood loss was encountered.  IMPRESSION: Status post image guided placement of temporary hemodialysis catheter. Catheter may be converted if needed. Signed, Dulcy Fanny. Dellia Nims, RPVI [facility] Radiology Electronically Signed   By: Corrie Mckusick D.O.   On: 05/22/2020 14:23   IR US Guide Vasc Access Right  Result Date: 05/22/2020 INDICATION: 55 year old male referred for hemodialysis catheter placement EXAM: IMAGE GUIDED PLACEMENT OF TEMPORARY HEMODIALYSIS CATHETER MEDICATIONS: None ANESTHESIA/SEDATION: None FLUOROSCOPY TIME:  Fluoroscopy Time: 0 minutes 6 seconds (0.7 mGy). COMPLICATIONS: None PROCEDURE: Informed written consent was obtained from the patient and the patient's family after a thorough discussion of the procedural risks, benefits and alternatives. All questions were addressed. A timeout was performed prior to the initiation of the procedure. The right neck and chest was prepped with chlorhexidine, and draped in the usual sterile fashion using maximum barrier technique (cap and mask, sterile gown, sterile gloves, large sterile sheet, hand hygiene and cutaneous antiseptic). Local anesthesia was attained by infiltration with 1% lidocaine without epinephrine. Ultrasound demonstrated patency of the right internal  jugular vein, and this was documented with an image. Under real-time ultrasound guidance, this vein was accessed with a 21 gauge micropuncture needle and image documentation was performed. A small dermatotomy was made at the access site with an 11 scalpel. A 0.018" wire was advanced into the SVC and the access needle exchanged for a 84F micropuncture vascular sheath. The 0.018" wire was then removed and a 0.035" wire advanced into the IVC. Upon withdrawal of the 018 wire, the wire was marked for appropriate length of the internal portion of the catheter. A 16 cm catheter was selected. Skin and subcutaneous tissues were serially dilated. Catheter was placed on the wire. The  catheter tip is positioned in the upper right atrium. This was documented with a spot image. Both ports of the hemodialysis catheter were then tested for excellent function. The ports were then locked with heparinized lock. Patient tolerated the procedure well and remained hemodynamically stable throughout. No complications were encountered and no significant blood loss was encountered. IMPRESSION: Status post image guided placement of temporary hemodialysis catheter. Catheter may be converted if needed. Signed, Dulcy Fanny. Dellia Nims, RPVI [facility] Radiology Electronically Signed   By: Corrie Mckusick D.O.   On: 05/22/2020 14:23    Medications: Infusions:  sodium chloride Stopped (05/21/20 1550)   albumin human Stopped (05/24/20 0635)   feeding supplement (NEPRO CARB STEADY) 1,000 mL (05/22/20 0955)   furosemide Stopped (05/24/20 0622)    Scheduled Medications:  carvedilol  6.25 mg Per Tube BID WC   chlorhexidine gluconate (MEDLINE KIT)  15 mL Mouth Rinse BID   Chlorhexidine Gluconate Cloth  6 each Topical Daily   Chlorhexidine Gluconate Cloth  6 each Topical Q0600   Chlorhexidine Gluconate Cloth  6 each Topical Q0600   clonazePAM  0.5 mg Per Tube BID   darbepoetin (ARANESP) injection - DIALYSIS  60 mcg Intravenous Q Mon-HD   feeding supplement (PROSource TF)  45 mL Per Tube 5 X Daily   fentaNYL  1 patch Transdermal Q72H   And   fentaNYL  1 patch Transdermal Q72H   fluconazole  50 mg Oral Daily   levothyroxine  125 mcg Per Tube Q0600   mouth rinse  15 mL Mouth Rinse 10 times per day   midazolam  2 mg Intravenous Once   multivitamin with minerals  1 tablet Per Tube Daily   nutrition supplement (JUVEN)  1 packet Per Tube BID BM   pantoprazole sodium  40 mg Per Tube BID   sertraline  100 mg Per Tube Daily   sodium chloride flush  10-40 mL Intracatheter Q12H   traZODone  100 mg Per Tube QHS    have reviewed  scheduled and prn medications.  Physical Exam: General: Elderly male, ill looking sitting up in bed.  On vent via tracheostomy Heart:RRR, s1s2 nl, anasarca present Lungs: Basal decreased breath sound, clear anteriorly Abdomen:soft, Non-tender, distended Extremities: Upper and lower extremities edema/anasarca present Neurology: alert awake Dialysis Access: Right IJ temporary HD catheter site clean.  Annai Heick 05/24/2020,11:42 AM  LOS: 25 days

## 2020-05-24 NOTE — Progress Notes (Addendum)
PROGRESS NOTE    Phillip Klein  BTD:[id number] DOB: 04-05-1965 DOA: 04/28/2020 PCP: [name], MD    Brief Narrative: This 55 years old male with PMH significant for chronic hypoxic respiratory failure with tracheostomy and vent since 2021 after C-spine surgery, GERD, DM, anemia of chronic disease, quadriplegia, malnutrition, neurogenic orthostatic hypotension, untreated HCV, previous IV drug use, cirrhosis, chronic pain syndrome was sent from [facility] with septic shock. Patient admitted in the ICU with septic shock secondary to ESBL/ [name] bacteremia. Patient had Hickman's catheter which was removed, presumed to be the source of infection. Patient has completed meropenem for 10 days after removal of the catheter. Patient has developed acute kidney injury secondary to ischemic ATN from pressors requiring septic shock.,Cardiology,Nephrology is following. PCCM is assisting with vent management. Patient had multiple chronic bedsores,wound care is following. Palliative care consulted to discuss goals of care.  Patient eventually started on hemodialysis.  Patient is not a candidate for long-term outpatient dialysis because of poor functional status and multiple comorbidities.  Assessment & Plan:   Principal Problem:   Gram-negative bacteremia Active Problems:   Septic shock (HCC)   Pressure injury of skin   Malnutrition of moderate degree   Encounter for feeding tube placement   Hypotension   Quadriplegia (HCC)   Infection due to ESBL-producing [name] pneumoniae   Chronic hepatitis C without hepatic coma (HCC)   Anemia   Jejunostomy tube leak (HCC)   Pleural effusion   Sinus arrest   Pacemaker   HFrEF (heart failure with reduced ejection fraction) (HCC)   Severe malnutrition (HCC)   Anasarca   Acute renal failure superimposed on stage 3b chronic kidney disease (HCC)   Septic shock, POA secondary to [name] bacteremia, UTI versus line infection   -Hickman catheter presumed to be the source of infection, removed. -Blood cultures + [name] bacteremia, UA positive, culture showed multiple species -Completed meropenem for 10 days on 05/12/2020, repeat blood cultures negative -PICC line inserted on 3/8, venous Doppler showed no DVT. -Sepsis physiology resolved  Active problems Acute kidney injury secondary to ischemic ATN, septic shock requiring pressors, chronic hypotension, anasarca -Patient was placed on albumin and Lasix for anasarca, per nephrology has not been effective, continue Lasix -Nephrology following, not a candidate for long-term dialysis,  however may be an option for management of severe azotemia. -Palliative medicine also following. - 05/22/20: Hemodialysis is planned - 05/23/2020: Patient underwent hemodialysis 3/20 with 2 L UF.    Next dialysis 3/21.  Acute blood loss anemia with intermittent bleeding from wounds : -Platelet dysfunction in the setting of severe azotemia, Nephrology recommended the conjugated estrogen (Premarin) via tube for 5 days,  completed 3/20. -Hemoglobin again down to 6.7, received 2 units PRBC 05/24/19: Hemoglobin is 8 g/dL .  Will check stool lactoferrin to rule out GI bleed.    Essential hypertension Continue Coreg, hydralazine IV as needed with parameters   Acute on chronic combined systolic and diastolic CHF exacerbation with anasarca: - Other contributers include severe protein calorie malnutrition with hypoalbuminemia and third spacing -2D echo showed EF of 40 to 45% with global hypokinesis -Nephrology following, treated with albumin/furosemide, fluid restriction, considered short-term hemodialysis -05/23/2020: Patient had first hemodialysis on 3/20.  For repeat hemodialysis today.  Chronic ventilator dependent respiratory failure, quadriplegia -Management per CCM, continue ventilatory support with trach. -Continue feeding through tube feed when possible.  The tubes are currently  clogged.   - Interventional radiology team has been consulted to replace G tube.  Hypotension,  likely orthostatic hypotension Florinef was discontinued in ICU due to concern for fluid retention, midodrine has been stopped. BP stable.  Chronic pain syndrome -Currently stable, continue fentanyl patch, oxycodone  Depression, insomnia Continue sertraline, trazodone, clonazepam  GERD Continue PPI  Left arm swelling -Venous Dopplers negative for DVT  Hematuria -resolving, urology was consulted, no need for any acute intervention.  Hypothermia:  -Patient was pancultured, f/u cultures -Hold on antibiotics. He just completed meropenum x 10 days. -Continue warming blanket. -Consider ID consult if cultures +  Multiple pressure wounds, present on admission -Full-thickness vertebral column stage IV -Sacrum stage IV, -Right and left ischial tuberosity, stage IV -Right lateral hip, stage IV -Right and left lower leg lateral, unstageable -Right (unstageable now) and left posterior elbow, stage III - left posterior lower arm, unstageable   05/23/2020: Patient has been refusing wound care/dressing.  Obesity Estimated body mass index is 30.43 kg/m as calculated from the following:   Height as of this encounter: '6\' 2"'  (1.88 m).   Weight as of this encounter: 107.5 kg.   DVT prophylaxis:  SCDs Code Status: Full code. Family Communication:  No family at bed side. Disposition Plan:  Status is: Inpatient  Remains inpatient appropriate because:Inpatient level of care appropriate due to severity of illness   Dispo: The patient is from: SNF              Anticipated d/c is to: LTAC              Patient currently is not medically stable to d/c.   Difficult to place patient Yes   Transferred back to [facility] or [facility] pending bed availability and cleared from nephrology.   Consultants:   PCCM  Cardiology  Nephrology  IR  Palliative  care  Procedures:  Antimicrobials: Anti-infectives (From admission, onward)   Start     Dose/Rate Route Frequency Ordered Stop   05/19/20 1000  fluconazole (DIFLUCAN) tablet 50 mg       "Followed by" Linked Group Details   50 mg Oral Daily 05/18/20 1048 05/28/20 0959   05/18/20 1145  fluconazole (DIFLUCAN) tablet 100 mg       "Followed by" Linked Group Details   100 mg Oral  Once 05/18/20 1048 05/18/20 1145   05/11/20 0800  meropenem (MERREM) 1 g in sodium chloride 0.9 % 100 mL IVPB        1 g 200 mL/hr over 30 Minutes Intravenous Every 8 hours 05/11/20 0707 05/12/20 1555   05/11/20 0715  meropenem (MERREM) 1 g in sodium chloride 0.9 % 100 mL IVPB  Status:  Discontinued        1 g 200 mL/hr over 30 Minutes Intravenous Every 8 hours 05/11/20 0706 05/11/20 0707   05/03/20 2200  meropenem (MERREM) 1 g in sodium chloride 0.9 % 100 mL IVPB  Status:  Discontinued        1 g 200 mL/hr over 30 Minutes Intravenous Every 12 hours 05/03/20 1041 05/11/20 0706   04/30/20 1145  meropenem (MERREM) 2 g in sodium chloride 0.9 % 100 mL IVPB  Status:  Discontinued        2 g 200 mL/hr over 30 Minutes Intravenous Every 12 hours 04/30/20 1052 05/03/20 1041   04/30/20 0530  ceFEPIme (MAXIPIME) 2 g in sodium chloride 0.9 % 100 mL IVPB  Status:  Discontinued        2 g 200 mL/hr over 30 Minutes Intravenous Every 12 hours 04/27/2020 1852 04/30/20 1051  04/28/2020 1852  vancomycin variable dose per unstable renal function (pharmacist dosing)  Status:  Discontinued         Does not apply See admin instructions 04/13/2020 1852 04/30/20 1051   04/16/2020 1630  vancomycin (VANCOREADY) IVPB 2000 mg/400 mL        2,000 mg 200 mL/hr over 120 Minutes Intravenous  Once 05/02/2020 1621 04/28/2020 2008   04/12/2020 1630  ceFEPIme (MAXIPIME) 2 g in sodium chloride 0.9 % 100 mL IVPB        2 g 200 mL/hr over 30 Minutes Intravenous  Once 04/07/2020 1621 04/28/2020 1749     Subjective: Patient was seen and examined at bedside.   Overnight events noted.  He was hypothermic ,  placed on warming blanket.  Patient still appears pretty swollen with scrotal edema and generalized anasarca.  He had hemodialysis yesterday, scheduled to have hemodialysis today.  He was anxious and was given Ativan, appears much calmer.  Objective: Vitals:   05/24/20 1030 05/24/20 1045 05/24/20 1100 05/24/20 1119  BP: (!) 150/95 (!) 150/91 (!) 150/94   Pulse: 64 64 63 64  Resp: 20 16 (!) 6 18  Temp: (!) 94.46 F (34.7 C) (!) 94.46 F (34.7 C) (!) 94.46 F (34.7 C) (!) 94.46 F (34.7 C)  TempSrc:      SpO2: 92% 93% 93% 91%  Weight:      Height:        Intake/Output Summary (Last 24 hours) at 05/24/2020 1148 Last data filed at 05/24/2020 1034 Gross per 24 hour  Intake 695.1 ml  Output 600 ml  Net 95.1 ml   Filed Weights   05/23/20 0700 05/23/20 0945 05/24/20 0500  Weight: 109.5 kg 107.5 kg 108.9 kg    Examination:  General exam: Appears calm and comfortable, not in any distress Respiratory system: Clear to auscultation. Respiratory effort normal. Cardiovascular system: S1 & S2 heard, RRR. No JVD, murmurs, rubs, gallops or clicks. No pedal edema. Gastrointestinal system: Abdomen is nondistended, soft and nontender. No organomegaly or masses felt. Normal bowel sounds heard. Colostomy bag on left. Central nervous system: Alert and oriented. No focal neurological deficits. Extremities: Bilateral heal protectors, Pitting edema, Anasarca. Skin: No rashes, lesions or ulcers Psychiatry: Judgement and insight appear normal. Mood & affect appropriate.     Data Reviewed: I have personally reviewed following labs and imaging studies  CBC: Recent Labs  Lab 05/20/20 0607 05/21/20 0452 05/21/20 1335 05/22/20 0527 05/22/20 1658 05/23/20 0659  WBC 5.1 4.3  --  6.2 5.0 4.9  HGB 7.2* 6.7* 7.6* 8.0* 7.8* 8.0*  HCT 23.4* 21.6* 24.3* 25.2* 25.4* 24.9*  MCV 91.8 91.9  --  90.0 91.7 89.6  PLT 121* 112*  --  125* 120* 111*   Basic  Metabolic Panel: Recent Labs  Lab 05/19/20 0224 05/20/20 0511 05/22/20 0527 05/22/20 1658 05/23/20 0531 05/23/20 0659 05/24/20 0500  NA 139   < > 142 139 138 139 140  K 4.9   < > 4.3 4.5 4.1 4.2 4.1  CL 102   < > 103 101 102 102 101  CO2 25   < > '23 22 22 22 22  ' GLUCOSE 116*   < > 112* 148* 114* 118* 97  BUN 224*   < > 270* 275* 270* 271* 269*  CREATININE 2.33*   < > 2.53* 2.70* 2.57* 2.66* 2.73*  CALCIUM 9.0   < > 8.9 8.9 9.1 9.3 9.2  MG 2.0  --   --   --   --   --   --  PHOS 9.4*   < > 10.0* 10.2* 10.1* 10.2* 10.6*   < > = values in this interval not displayed.   GFR: Estimated Creatinine Clearance: 41.6 mL/min (A) (by C-G formula based on SCr of 2.73 mg/dL (H)). Liver Function Tests: Recent Labs  Lab 05/22/20 0527 05/22/20 1658 05/23/20 0531 05/23/20 0659 05/24/20 0500  AST  --   --   --   --  30  ALT  --   --   --   --  20  ALKPHOS  --   --   --   --  222*  BILITOT  --   --   --   --  1.5*  PROT  --   --   --   --  7.8  ALBUMIN 2.4* 2.3* 2.7* 2.9* 3.4*   No results for input(s): LIPASE, AMYLASE in the last 168 hours. No results for input(s): AMMONIA in the last 168 hours. Coagulation Profile: No results for input(s): INR, PROTIME in the last 168 hours. Cardiac Enzymes: No results for input(s): CKTOTAL, CKMB, CKMBINDEX, TROPONINI in the last 168 hours. BNP (last 3 results) No results for input(s): PROBNP in the last 8760 hours. HbA1C: No results for input(s): HGBA1C in the last 72 hours. CBG: Recent Labs  Lab 05/23/20 1956 05/23/20 2345 05/24/20 0406 05/24/20 0732 05/24/20 1122  GLUCAP 104* 89 94 92 89   Lipid Profile: No results for input(s): CHOL, HDL, LDLCALC, TRIG, CHOLHDL, LDLDIRECT in the last 72 hours. Thyroid Function Tests: No results for input(s): TSH, T4TOTAL, FREET4, T3FREE, THYROIDAB in the last 72 hours. Anemia Panel: No results for input(s): VITAMINB12, FOLATE, FERRITIN, TIBC, IRON, RETICCTPCT in the last 72 hours. Sepsis Labs: No  results for input(s): PROCALCITON, LATICACIDVEN in the last 168 hours.  Recent Results (from the past 240 hour(s))  Culture, Urine     Status: Abnormal   Collection Time: 05/23/20  9:31 AM   Specimen: Urine, Random  Result Value Ref Range Status   Specimen Description URINE, RANDOM  Final   Special Requests   Final    NONE Performed at [facility], [address], [place], [place] [address]    Culture MULTIPLE SPECIES PRESENT, SUGGEST RECOLLECTION (A)  Final   Report Status 05/24/2020 FINAL  Final  Culture, blood (routine x 2)     Status: None (Preliminary result)   Collection Time: 05/23/20 10:02 AM   Specimen: BLOOD LEFT HAND  Result Value Ref Range Status   Specimen Description BLOOD LEFT HAND  Final   Special Requests   Final    BOTTLES DRAWN AEROBIC ONLY Blood Culture results may not be optimal due to an inadequate volume of blood received in culture bottles   Culture   Final    NO GROWTH < 24 HOURS Performed at [facility], [place] [address] [place], [place] [address]    Report Status PENDING  Incomplete  Culture, blood (routine x 2)     Status: None (Preliminary result)   Collection Time: 05/23/20 10:17 AM   Specimen: BLOOD LEFT HAND  Result Value Ref Range Status   Specimen Description BLOOD LEFT HAND  Final   Special Requests   Final    BOTTLES DRAWN AEROBIC ONLY Blood Culture results may not be optimal due to an inadequate volume of blood received in culture bottles   Culture   Final    NO GROWTH < 24 HOURS Performed at [facility], [place] [address]  [place], [place] [address]    Report Status PENDING  Incomplete  Culture, Respiratory w Gram Stain     Status: None (Preliminary result)   Collection Time: 05/23/20  5:12 PM   Specimen: Tracheal Aspirate; Respiratory  Result Value Ref Range Status   Specimen Description TRACHEAL ASPIRATE  Final   Special Requests NONE  Final   Gram Stain   Final    MODERATE WBC PRESENT, PREDOMINANTLY PMN ABUNDANT  GRAM POSITIVE COCCI IN PAIRS IN CHAINS    Culture   Final    CULTURE REINCUBATED FOR BETTER GROWTH Performed at [facility], [place] [address], [place],  [address]    Report Status PENDING  Incomplete    Radiology Studies: IR Fluoro Guide CV Line Right  Result Date: 05/22/2020 INDICATION: 55 year old male referred for hemodialysis catheter placement EXAM: IMAGE GUIDED PLACEMENT OF TEMPORARY HEMODIALYSIS CATHETER MEDICATIONS: None ANESTHESIA/SEDATION: None FLUOROSCOPY TIME:  Fluoroscopy Time: 0 minutes 6 seconds (0.7 mGy). COMPLICATIONS: None PROCEDURE: Informed written consent was obtained from the patient and the patient's family after a thorough discussion of the procedural risks, benefits and alternatives. All questions were addressed. A timeout was performed prior to the initiation of the procedure. The right neck and chest was prepped with chlorhexidine, and draped in the usual sterile fashion using maximum barrier technique (cap and mask, sterile gown, sterile gloves, large sterile sheet, hand hygiene and cutaneous antiseptic). Local anesthesia was attained by infiltration with 1% lidocaine without epinephrine. Ultrasound demonstrated patency of the right internal jugular vein, and this was documented with an image. Under real-time ultrasound guidance, this vein was accessed with a 21 gauge micropuncture needle and image documentation was performed. A small dermatotomy was made at the access site with an 11 scalpel. A 0.018" wire was advanced into the SVC and the access needle exchanged for a 45F micropuncture vascular sheath. The 0.018" wire was then removed and a 0.035" wire advanced into the IVC. Upon withdrawal of the 018 wire, the wire was marked for appropriate length of the internal portion of the catheter. A 16 cm catheter was selected. Skin and subcutaneous tissues were serially dilated. Catheter was placed on the wire. The catheter tip is positioned in the upper right atrium.  This was documented with a spot image. Both ports of the hemodialysis catheter were then tested for excellent function. The ports were then locked with heparinized lock. Patient tolerated the procedure well and remained hemodynamically stable throughout. No complications were encountered and no significant blood loss was encountered. IMPRESSION: Status post image guided placement of temporary hemodialysis catheter. Catheter may be converted if needed. Signed, Dulcy Fanny. Dellia Nims, RPVI [facility] Electronically Signed   By: Corrie Mckusick D.O.   On: 05/22/2020 14:23   IR US Guide Vasc Access Right  Result Date: 05/22/2020 INDICATION: 55 year old male referred for hemodialysis catheter placement EXAM: IMAGE GUIDED PLACEMENT OF TEMPORARY HEMODIALYSIS CATHETER MEDICATIONS: None ANESTHESIA/SEDATION: None FLUOROSCOPY TIME:  Fluoroscopy Time: 0 minutes 6 seconds (0.7 mGy). COMPLICATIONS: None PROCEDURE: Informed written consent was obtained from the patient and the patient's family after a thorough discussion of the procedural risks, benefits and alternatives. All questions were addressed. A timeout was performed prior to the initiation of the procedure. The right neck and chest was prepped with chlorhexidine, and draped in the usual sterile fashion using maximum barrier technique (cap and mask, sterile gown, sterile gloves, large sterile sheet, hand hygiene and cutaneous antiseptic). Local anesthesia was attained by infiltration  with 1% lidocaine without epinephrine. Ultrasound demonstrated patency of the right internal jugular vein, and this was documented with an image. Under real-time ultrasound guidance, this vein was accessed with a 21 gauge micropuncture needle and image documentation was performed. A small dermatotomy was made at the access site with an 11 scalpel. A 0.018" wire was advanced into the SVC and the access needle exchanged for a 96F  micropuncture vascular sheath. The 0.018" wire was then removed and a 0.035" wire advanced into the IVC. Upon withdrawal of the 018 wire, the wire was marked for appropriate length of the internal portion of the catheter. A 16 cm catheter was selected. Skin and subcutaneous tissues were serially dilated. Catheter was placed on the wire. The catheter tip is positioned in the upper right atrium. This was documented with a spot image. Both ports of the hemodialysis catheter were then tested for excellent function. The ports were then locked with heparinized lock. Patient tolerated the procedure well and remained hemodynamically stable throughout. No complications were encountered and no significant blood loss was encountered. IMPRESSION: Status post image guided placement of temporary hemodialysis catheter. Catheter may be converted if needed. Signed, Dulcy Fanny. Dellia Nims, RPVI [facility] Electronically Signed   By: Corrie Mckusick D.O.   On: 05/22/2020 14:23   Scheduled Meds: . carvedilol  6.25 mg Per Tube BID WC  . chlorhexidine gluconate (MEDLINE KIT)  15 mL Mouth Rinse BID  . Chlorhexidine Gluconate Cloth  6 each Topical Daily  . Chlorhexidine Gluconate Cloth  6 each Topical Q0600  . Chlorhexidine Gluconate Cloth  6 each Topical Q0600  . clonazePAM  0.5 mg Per Tube BID  . darbepoetin (ARANESP) injection - DIALYSIS  60 mcg Intravenous Q Mon-HD  . feeding supplement (PROSource TF)  45 mL Per Tube 5 X Daily  . fentaNYL  1 patch Transdermal Q72H   And  . fentaNYL  1 patch Transdermal Q72H  . fluconazole  50 mg Oral Daily  . levothyroxine  125 mcg Per Tube Q0600  . mouth rinse  15 mL Mouth Rinse 10 times per day  . midazolam  2 mg Intravenous Once  . multivitamin with minerals  1 tablet Per Tube Daily  . nutrition supplement (JUVEN)  1 packet Per Tube BID BM  . pantoprazole sodium  40 mg Per Tube BID  . sertraline  100 mg Per Tube Daily  .  sodium chloride flush  10-40 mL Intracatheter Q12H  . traZODone  100 mg Per Tube QHS   Continuous Infusions: . sodium chloride Stopped (05/21/20 1550)  . albumin human Stopped (05/24/20 8592)  . feeding supplement (NEPRO CARB STEADY) 1,000 mL (05/22/20 0955)  . furosemide Stopped (05/24/20 0622)     LOS: 25 days    Time spent: 35 mins    PARDEEP KUMAR, MD [facility]   If 7PM-7AM, please contact night-coverage

## 2020-05-24 NOTE — Progress Notes (Signed)
Nutrition Follow-up  DOCUMENTATION CODES:   Non-severe (moderate) malnutrition in context of chronic illness  INTERVENTION:   D/C MVI with minerals.  When J-port is unclogged/replaced, resume TF:   Change to Osmolite 1.5 at 65 ml/h (1560 ml per day) Prosource TF 45 ml 5 times per day  Provides 2540 kcal, 160 gm protein, 1192 ml free water daily  Ensure adequate water flushes to prevent tube from clogging: 20-30 ml every 4 hours, before and after med administration and when continuous feeding is interrupted.  Continue Juven BID, each packet provides 80 calories, 8 grams of carbohydrate, 2.5  grams of protein (collagen), 7 grams of L-arginine and 7 grams of L-glutamine; supplement contains CaHMB, Vitamins C, E, B12 and Zinc to promote wound healing.  NUTRITION DIAGNOSIS:   Moderate Malnutrition related to chronic illness (CHF, cirrhosis) as evidenced by moderate fat depletion,severe muscle depletion.  Ongoing  GOAL:   Patient will meet greater than or equal to 90% of their needs  Met   MONITOR:   Vent status,Labs,Weight trends,TF tolerance,Skin,I & O's  REASON FOR ASSESSMENT:   Ventilator,Consult Enteral/tube feeding initiation and management  ASSESSMENT:   55 year old male who presented to the ED on 2/24 from [facility] with hypotension, fever. Pt was scheduled to have revision of J-tube on 2/24 and was found to be hypotensive. PMH of vent dependence via trach, quadriplegia after cervical spine surgery in summer of 2021, s/p ex-lap for G-tube malfunction, multiple pressure injuries, GERD, T2DM, anemia, CHF, previous IVDA, cirrhosis. Pt admitted with septic shock.  Patient has a PEG-J tube. J-tube is clogged. TF on hold since 3/19. IR has been consulted to assess J-tube.   S/P first HD 3/20. Plans for HD again today.   TF was changed to Nepro last week. K is WNL. Phosphorus remains elevated and continues to rise, despite being on a renal TF formula. Nepro formula has a  higher viscosity than most TF formulas and may be difficult to infuse through a small J-tube.   Received MD consult for assessment of TF formula due to increased BUN. Patient with increased protein needs for wound healing (multiple stage 4 and unstageable pressure injuries). Elevated BUN could be related to ntravascular volume depletion despite anasarca. May need to add IVF.   Will change TF to Osmolite 1.5 for better tolerance via J-tube.  Patient is on chronic ventilatory support via trach.  MV: 9.2 L/min Temp (24hrs), Avg:95.4 F (35.2 C), Min:94.46 F (34.7 C), Max:96.26 F (35.7 C)   Labs reviewed. Phos 10.6, BUN 269, creatinine 2.73 CBG: 106-77-100  Medications reviewed and include Aranesp, Lasix, MVI with minerals, Juven.  Admit weight: 107.5 kg  Current weight: 108.9 kg    UOP: 720 mL x 24 hrs Stool output: 125 mL x 24 hours (colostomy) I&O's: +4.4 L since admit  Diet Order:   Diet Order            Diet NPO time specified  Diet effective now                 EDUCATION NEEDS:   No education needs have been identified at this time  Skin:  Skin Assessment: Skin Integrity Issues: Skin Integrity Issues:: Incisions DTI: R leg Stage I: R leg Stage III: L elbow Stage IV: vertebral column, sacrum, bilateral ischial tuberosity, R hip, R lower leg Unstageable: L leg x 2, R leg x 3, R elbow, L arm Incisions: closed abdomen Other: skin tear right arm  Last BM:  3/20 (colostomy)  Height:   Ht Readings from Last 1 Encounters:  05/10/20 '6\' 2"'  (1.88 m)    Weight:   Wt Readings from Last 1 Encounters:  05/24/20 108.9 kg    Ideal Body Weight:  86.4 kg  BMI:  Body mass index is 30.82 kg/m.  Estimated Nutritional Needs:   Kcal:  2400-2600  Protein:  150-170 grams  Fluid:  >/= 2.0 L   Lucas Mallow, RD, LDN, CNSC Please refer to Amion for contact information.

## 2020-05-24 NOTE — Procedures (Signed)
Tracheostomy Exchange Procedure Note  Phillip Klein  [id number]  30-Jun-1965  Date:05/24/20  Time:2:51 PM   Provider Performing:Sani Madariaga C [name]   Procedure: Tracheostomy Exchange Through Immature Stoma (29476)  Indication(s) 51+ week old trach, cuff leak, tight stoma  Consent Discussed with patient prior to procedure  Anesthesia fent, 2mg  versed (trach is tight, painful with manipulation)   Time Out Verified patient identification, verified procedure, site/side was marked, verified correct patient position, special equipment/implants available, medications/allergies/relevant history reviewed, required imaging and test results available.   Sterile Technique Hand hygiene, gloves   Procedure Description Size 6-0 distal XLT cuffed existing Shiley removed and size 6-0 cuffed Distal XLT Shiley placed through stoma.   Complications/Tolerance None; patient tolerated the procedure well..   EBL Minimal

## 2020-05-24 NOTE — Progress Notes (Signed)
Daily Progress Note   Patient Name: Phillip Klein       Date: 05/24/2020 DOB: 06/21/1965  Age: 55 y.o. MRN#: [id number] Attending Physician: Phillip Clamp, MD Primary Care Physician: Phillip Liberty, MD Admit Date: 04/22/2020  Reason for Consultation/Follow-up: Establishing goals of care  Subjective: Patient s/p trach exchange- remains somnolent from sedation.  Phillip Klein for f/u. Gave medical update. Phillip Klein visited this weekend- felt that Phillip Klein understood some of their discussion- but he was also making requests that evidenced his lack of insight into his medical situation.  She acknowledges patient's suffering.   Review of Systems  Unable to perform ROS: Medical condition    Length of Stay: 25  Current Medications: Scheduled Meds:  . carvedilol  6.25 mg Per Tube BID WC  . chlorhexidine gluconate (MEDLINE KIT)  15 mL Mouth Rinse BID  . Chlorhexidine Gluconate Cloth  6 each Topical Daily  . Chlorhexidine Gluconate Cloth  6 each Topical Q0600  . Chlorhexidine Gluconate Cloth  6 each Topical Q0600  . clonazePAM  0.5 mg Per Tube BID  . darbepoetin (ARANESP) injection - DIALYSIS  60 mcg Intravenous Q Mon-HD  . etomidate      . feeding supplement (PROSource TF)  45 mL Per Tube 5 X Daily  . fentaNYL  1 patch Transdermal Q72H   And  . fentaNYL  1 patch Transdermal Q72H  . fluconazole  50 mg Oral Daily  . ketamine HCl      . levothyroxine  125 mcg Per Tube Q0600  . mouth rinse  15 mL Mouth Rinse 10 times per day  . midazolam  2 mg Intravenous Once  . multivitamin with minerals  1 tablet Per Tube Daily  . nutrition supplement (JUVEN)  1 packet Per Tube BID BM  . pantoprazole sodium  40 mg Per Tube BID  . rocuronium bromide      . sertraline  100 mg Per Tube Daily  . sodium  chloride flush  10-40 mL Intracatheter Q12H  . traZODone  100 mg Per Tube QHS    Continuous Infusions: . sodium chloride Stopped (05/21/20 1550)  . feeding supplement (OSMOLITE 1.5 CAL)      PRN Meds: sodium chloride, acetaminophen, albuterol, alteplase, docusate, fentaNYL (SUBLIMAZE) injection, heparin, hydrALAZINE, lidocaine (PF), lidocaine-prilocaine, LORazepam, oxyCODONE, pentafluoroprop-tetrafluoroeth, polyethylene glycol, prochlorperazine, sodium chloride  flush  Physical Exam Vitals and nursing note reviewed.  Constitutional:      Appearance: He is ill-appearing.  Cardiovascular:     Comments: Diffuse anasarca Skin:    Coloration: Skin is pale.             Vital Signs: BP 129/78   Pulse 73   Temp (!) 97.52 F (36.4 C)   Resp 18   Ht $R'6\' 2"'[place]$  (1.88 m)   Wt 108.9 kg   SpO2 95%   BMI 30.82 kg/m  SpO2: SpO2: 95 % O2 Device: O2 Device: Ventilator O2 Flow Rate: O2 Flow Rate (L/min): 60 L/min  Intake/output summary:   Intake/Output Summary (Last 24 hours) at 05/24/2020 1651 Last data filed at 05/24/2020 1600 Gross per 24 hour  Intake 782.03 ml  Output 604 ml  Net 178.03 ml   LBM: Last BM Date: 05/23/20 Baseline Weight: Weight: 107.5 kg Most recent weight: Weight:  (Unable to obtain due to warming blanket)       Palliative Assessment/Data: PPS: 10%    Flowsheet Rows   Flowsheet Row Most Recent Value  Intake Tab   Referral Department Hospitalist  Unit at Time of Referral ICU  Palliative Care Primary Diagnosis Sepsis/Infectious Disease  Date Notified 05/12/20  Palliative Care Type New Palliative care  Reason for referral Clarify Goals of Care  Date of Admission 04/27/2020  Date first seen by Palliative Care 05/13/20  # of days Palliative referral response time 1 Day(s)  # of days IP prior to Palliative referral 13  Clinical Assessment   Psychosocial & Spiritual Assessment   Palliative Care Outcomes       Patient Active Problem List   Diagnosis Date  Noted  . Severe malnutrition ([place])   . Anasarca   . Acute renal failure superimposed on stage 3b chronic kidney disease ([place])   . Sinus arrest   . Pacemaker   . HFrEF (heart failure with reduced ejection fraction) ([place])   . Anemia   . Jejunostomy tube leak ([place])   . Pleural effusion   . Quadriplegia ([place]) 05/01/2020  . Gram-negative bacteremia 05/01/2020  . Infection due to ESBL-producing [name] pneumoniae 05/01/2020  . Chronic hepatitis C without hepatic coma ([place]) 05/01/2020  . Encounter for feeding tube placement   . Hypotension   . Pressure injury of skin 04/30/2020  . Malnutrition of moderate degree 04/30/2020  . 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 Palliative medicine consulted for goals of care as patient was refusing dressing changes and wound care and other care interventions.  Assessment/Recommendations/Plan   Continue full scope/full code  PMT will continue to follow  Goals of Care and Additional Recommendations:  Limitations on Scope of Treatment: Full Scope Treatment  Code Status:  Full code  Prognosis:   Unable to determine  Discharge Planning:  To Be Determined  Care plan was discussed with HCPOA- Phillip Klein    Thank you for allowing the Palliative Medicine Team to assist in the care of this patient.  Total time: 39 minutes  Greater than 50%  of this time was spent counseling and coordinating care  related to the above  assessment and plan.  Phillip Klein, AGNP-C Palliative Medicine   Please contact Palliative Medicine Team phone at [phone] for questions and concerns.

## 2020-05-24 NOTE — Progress Notes (Signed)
NAME:  Phillip Klein, MRN:  [id number], DOB:  Oct 17, 1965, LOS: 25 ADMISSION DATE:  05-25-20, CONSULTATION DATE:  2/27 REFERRING MD:  Silverio Lay, CHIEF COMPLAINT:  Sepsis   Brief History:  55 yo male who presented from [facility]. Admitted 2/24 with septic shock, ESBL [name]. Hickman catheter removed with concern for source of infection. Oliguric AKI , with baseline creatinine 1.8.   Past Medical History:  Chronic hypoxic respiratory failure/ tstomy/vent since 2021 after c spine surgery Hx of MRSA GERD Type 2 diabetes Anemia of chronic illness Osteomyelitis of cervical spine  Quadriplegia Spinal epidural abscess C2-C7 and T9-L4 Malnutrition Neurogenic astrostatic hypotension  HFrEF Untreated HCV Previous IVDU Cirrhosis  Chronic pain  Significant Hospital Events:  2/24 Admit with shock in the setting of klebsiella bacteremia  3/02 albumin 3/03 lasix 3/05 trial of SIMV/PS 3/14 Full support PEEP 10, 40% HGB 6.9 (TRH Mgmt) > diuresed / no transfusion (hemodilution) 3/17 On vent, PEEP 8 / 40%  Consults:  CCM ID Renal 3/2  Procedures:  Trach PTA >>  Hickman catheter PTA >> removed 2/26  Significant Diagnostic Tests:    Micro Data:  Blood 2/24  >> [name] ESBL 1/4 bottle Urine  2/26 >> multiple species   Antimicrobials:  Cefepime 2/24 >> 2/25 Vanc 2/24  >> 2/25  Mero 2/25  >>  3/9  Interim History / Subjective:  Patient is started complaining of shortness of breath even though his O2 sat remained about 90% Currently FiO2 remains at 40% with PEEP of 5, patient is able to maintain O2 sat around 90% Remained afebrile, received HD session yesterday per nephrology  Objective   Blood pressure (!) 150/94, pulse 64, temperature (!) 94.46 F (34.7 C), resp. rate 18, height 6\' 2"  (1.88 m), weight 108.9 kg, SpO2 91 %.    Vent Mode: PRVC FiO2 (%):  [40 %-100 %] 40 % Set Rate:  [18 bmp] 18 bmp Vt Set:  [570 mL-650 mL] 570 mL PEEP:  [5 cmH20-8 cmH20] 5  cmH20 Plateau Pressure:  [18 cmH20-30 cmH20] 18 cmH20   Intake/Output Summary (Last 24 hours) at 05/24/2020 1307 Last data filed at 05/24/2020 1034 Gross per 24 hour  Intake 695.1 ml  Output 525 ml  Net 170.1 ml   Filed Weights   05/23/20 0700 05/23/20 0945 05/24/20 0500  Weight: 109.5 kg 107.5 kg 108.9 kg    Examination: General: Chronically ill appearing male, sitting in the bed, NAD on vent  HEENT: MM pink/moist, #7 distal XLT trach midline c/d/i Neuro: Awake, alert, communicates by mouthing words CV: s1s2 RRR, NSR, no m/r/g PULM: non-labored on vent, lungs bilaterally clear GI: soft, bsx4 active, abd dressings intact / PEG c/d/i Extremities: warm/dry, anasarca   Skin: no rashes or lesions  Resolved Hospital Problem list   Septic shock  Assessment & Plan:   Chronic Hypoxemic Respiratory Failure in the Setting of Quadriplegia status post tracheostomy / Vent Dependent Chronic respiratory failure unlikely unable to be weaned from mechanical ventilation. PRVC Vt 580, Rate 20, PEEP 5, FiO2 40% Routine trach care per protocol We will assess to see if trach can be changed today  All Other Issues Below per TRH  [name] bacteremia from UTI vs line infection Hickman catheter removed  Pacemaker in Place AKI likely ATN in setting of septic shock, on hemodialysis Anasarca Jejunostomy tube drainage> improved Hypotension likely related to dysautonomia - off midodrine Chronic pain Depression, insomnia Anemia of chronic disease + ABLA with Oozing from Wounds / Uremic Platelet Dysfunction  [facility] primary - PCCM will continue to f/u 1-2x/week while in hospital   Best practice (evaluated daily)  Diet: tube feeding Pain/Anxiety/Delirium protocol (if indicated): Fentanyl patch VAP protocol (if indicated): yes DVT prophylaxis: sub q heparin GI prophylaxis: n/a Glucose control: SSI Mobility: bed rest Disposition: Per TRH, to vent SNF when bed available  Goals of Care:  Last  date of multidisciplinary goals of care discussion:3/3  Family and staff present: wife, bedside nurse, patient, [name] Summary of discussion: "keep me alive at all costs" Follow up goals of care discussion due: 3/10 by primary team Code Status: full code, further discussion per Palliative Care / [facility]  Labs   CBC: Recent Labs  Lab 05/20/20 0607 05/21/20 0452 05/21/20 1335 05/22/20 0527 05/22/20 1658 05/23/20 0659  WBC 5.1 4.3  --  6.2 5.0 4.9  HGB 7.2* 6.7* 7.6* 8.0* 7.8* 8.0*  HCT 23.4* 21.6* 24.3* 25.2* 25.4* 24.9*  MCV 91.8 91.9  --  90.0 91.7 89.6  PLT 121* 112*  --  125* 120* 111*    Basic Metabolic Panel: Recent Labs  Lab 05/19/20 0224 05/20/20 0511 05/22/20 0527 05/22/20 1658 05/23/20 0531 05/23/20 0659 05/24/20 0500  NA 139   < > 142 139 138 139 140  K 4.9   < > 4.3 4.5 4.1 4.2 4.1  CL 102   < > 103 101 102 102 101  CO2 25   < > 23 22 22 22 22   GLUCOSE 116*   < > 112* 148* 114* 118* 97  BUN 224*   < > 270* 275* 270* 271* 269*  CREATININE 2.33*   < > 2.53* 2.70* 2.57* 2.66* 2.73*  CALCIUM 9.0   < > 8.9 8.9 9.1 9.3 9.2  MG 2.0  --   --   --   --   --   --   PHOS 9.4*   < > 10.0* 10.2* 10.1* 10.2* 10.6*   < > = values in this interval not displayed.   GFR: Estimated Creatinine Clearance: 41.6 mL/min (A) (by C-G formula based on SCr of 2.73 mg/dL (H)). Recent Labs  Lab 05/21/20 0452 05/22/20 0527 05/22/20 1658 05/23/20 0659  WBC 4.3 6.2 5.0 4.9    Liver Function Tests: Recent Labs  Lab 05/22/20 0527 05/22/20 1658 05/23/20 0531 05/23/20 0659 05/24/20 0500  AST  --   --   --   --  30  ALT  --   --   --   --  20  ALKPHOS  --   --   --   --  222*  BILITOT  --   --   --   --  1.5*  PROT  --   --   --   --  7.8  ALBUMIN 2.4* 2.3* 2.7* 2.9* 3.4*   No results for input(s): LIPASE, AMYLASE in the last 168 hours. No results for input(s): AMMONIA in the last 168 hours.  ABG    Component Value Date/Time   PHART 7.493 (H) 05/11/2020 2048   PCO2ART  32.3 05/11/2020 2048   PO2ART 100 05/11/2020 2048   HCO3 24.9 05/11/2020 2048   TCO2 26 05/11/2020 2048   ACIDBASEDEF 7.0 (H) May 08, 2020 1639   O2SAT 98.0 05/11/2020 2048     Coagulation Profile: No results for input(s): INR, PROTIME in the last 168 hours.  Cardiac Enzymes: No results for input(s): CKTOTAL, CKMB, CKMBINDEX, TROPONINI in the last 168 hours.  HbA1C: Hgb A1c MFr Bld  Date/Time Value Ref  Range Status  05-17-2020 10:00 PM 5.1 4.8 - 5.6 % Final    Comment:    (NOTE) Pre diabetes:          5.7%-6.4%  Diabetes:              >6.4%  Glycemic control for   <7.0% adults with diabetes     CBG: Recent Labs  Lab 05/23/20 1956 05/23/20 2345 05/24/20 0406 05/24/20 0732 05/24/20 1122  GLUCAP 104* 89 94 92 89      Cheri Fowler MD [facility] See Amion for pager If no response to pager, please call [phone] until 7pm After 7pm, Please call E-link [phone]

## 2020-05-24 NOTE — Progress Notes (Signed)
Continues to refused mouth care and daily cares, turns, and bathes.

## 2020-05-24 NOTE — Progress Notes (Addendum)
Notified ON CALL Dr. Thomes Dinning about increased need of O2 and possibility of trach being to large. Will continue to monitor. Esperanza Sheets, RN 05/24/2020 4:25 AM   Doing better on the ventilator and able to decrease FiO2 this morning and currently down to 80%.  Esperanza Sheets, RN 05/24/2020 6:42 AM

## 2020-05-25 ENCOUNTER — Inpatient Hospital Stay (HOSPITAL_COMMUNITY): Payer: Medicare Other

## 2020-05-25 DIAGNOSIS — A4159 Other Gram-negative sepsis: Secondary | ICD-10-CM | POA: Diagnosis not present

## 2020-05-25 DIAGNOSIS — R7881 Bacteremia: Secondary | ICD-10-CM | POA: Diagnosis not present

## 2020-05-25 DIAGNOSIS — I959 Hypotension, unspecified: Secondary | ICD-10-CM | POA: Diagnosis not present

## 2020-05-25 LAB — CULTURE, RESPIRATORY W GRAM STAIN

## 2020-05-25 LAB — COMPREHENSIVE METABOLIC PANEL
ALT: 19 U/L (ref 0–44)
AST: 29 U/L (ref 15–41)
Albumin: 3.1 g/dL — ABNORMAL LOW (ref 3.5–5.0)
Alkaline Phosphatase: 241 U/L — ABNORMAL HIGH (ref 38–126)
Anion gap: 14 (ref 5–15)
BUN: 191 mg/dL — ABNORMAL HIGH (ref 6–20)
CO2: 24 mmol/L (ref 22–32)
Calcium: 9.2 mg/dL (ref 8.9–10.3)
Chloride: 102 mmol/L (ref 98–111)
Creatinine, Ser: 2.21 mg/dL — ABNORMAL HIGH (ref 0.61–1.24)
GFR, Estimated: 35 mL/min — ABNORMAL LOW (ref >60)
Glucose, Bld: 86 mg/dL (ref 70–99)
Potassium: 3.9 mmol/L (ref 3.5–5.1)
Sodium: 140 mmol/L (ref 135–145)
Total Bilirubin: 1.3 mg/dL — ABNORMAL HIGH (ref 0.3–1.2)
Total Protein: 7.4 g/dL (ref 6.5–8.1)

## 2020-05-25 LAB — CBC
HCT: 25.7 % — ABNORMAL LOW (ref 39.0–52.0)
Hemoglobin: 8 g/dL — ABNORMAL LOW (ref 13.0–17.0)
MCH: 28.5 pg (ref 26.0–34.0)
MCHC: 31.1 g/dL (ref 30.0–36.0)
MCV: 91.5 fL (ref 80.0–100.0)
Platelets: 138 10*3/uL — ABNORMAL LOW (ref 150–400)
RBC: 2.81 MIL/uL — ABNORMAL LOW (ref 4.22–5.81)
RDW: 15.9 % — ABNORMAL HIGH (ref 11.5–15.5)
WBC: 5.6 10*3/uL (ref 4.0–10.5)
nRBC: 0 % (ref 0.0–0.2)

## 2020-05-25 LAB — GLUCOSE, CAPILLARY
Glucose-Capillary: 80 mg/dL (ref 70–99)
Glucose-Capillary: 74 mg/dL (ref 70–99)
Glucose-Capillary: 76 mg/dL (ref 70–99)
Glucose-Capillary: 72 mg/dL (ref 70–99)
Glucose-Capillary: 79 mg/dL (ref 70–99)
Glucose-Capillary: 89 mg/dL (ref 70–99)

## 2020-05-25 LAB — PHOSPHORUS: Phosphorus: 8.3 mg/dL — ABNORMAL HIGH (ref 2.5–4.6)

## 2020-05-25 LAB — MAGNESIUM: Magnesium: 2 mg/dL (ref 1.7–2.4)

## 2020-05-25 MED ORDER — LINEZOLID 600 MG/300ML IV SOLN
600.0000 mg | Freq: Two times a day (BID) | INTRAVENOUS | Status: DC
  Administered 2020-05-25 – 2020-05-27 (×4): 600 mg via INTRAVENOUS
  Filled 2020-05-25 (×4): qty 300

## 2020-05-25 NOTE — Progress Notes (Signed)
Phillip Klein  Assessment/ Plan:  # Acute kidney Injury: Secondary to ischemic ATN from pressor requiring septic shock (with background history of chronic hypotension). Started dialysis for fluid overload refractory to IV diuretics and azotemia on 3/20. Temporary catheter placed with IR on 3/19 As per patient's HCPOA Phillip Klein, the patient has had dialysis in the past and then the kidney function improved.  She wants to try this time as well.  She is aware that patient is not a candidate for long-term outpatient dialysis because of poor functional status and multiple comorbidities.  -I do believe his Cr is overestimated due to lack of muscle mass, kidney function likely lower/worse - IHD #3 today, slow start protocol to prevent dialysis dysequilibrium syndrome. Tentative plan is to do HD again on Thursday  # Anemia with intermittent bleeding from wounds: Secondary to platelet dysfunction in the setting of severe azotemia.  Received blood transfusion.  Hemoglobin is stable today.  # Sepsis secondary to [name] bacteremia: abx per primary service  # Severe protein calorie malnutrition: Ongoing supplemental nutrition/tube feeds.  # History of diastolic heart failure/anasarca: He has significant anasarca in part from his protein calorie malnutrition/low albumin and associated third spacing with preceding use of Florinef.   Treating with Albumin/furosemide, fluid restriction. UF with HD.   # Chronic ventilator dependent respiratory failure, quadriplegia and multiple pressure wounds/ulcers: With ongoing management for chronic pain/blood loss anemia and awaiting transfer to LTAC.  Subjective: Seen and examined in ICU. Tolerated HD yesterday, net uf 1.5L, BUN down to 191. Still endorses nausea but no other complaints. S/p trach exchange yesterday. Due for HD today  Objective Vital signs in last 24 hours: Vitals:   05/25/20 0400 05/25/20 0500 05/25/20 0600  05/25/20 0713  BP: (!) 145/90 (!) 150/97 (!) 155/94 (!) 154/96  Pulse: 66 84 71 62  Resp: 18 18 18 (!) 22  Temp: 97.7 F (36.5 C) (!) 97.3 F (36.3 C) (!) 96.8 F (36 C) (!) 96.44 F (35.8 C)  TempSrc:   Bladder   SpO2: 97% 96% 97% 93%  Weight:      Height:       Weight change: 0.8 kg  Intake/Output Summary (Last 24 hours) at 05/25/2020 1030 Last data filed at 05/25/2020 0501 Gross per 24 hour  Intake 559.71 ml  Output 2278 ml  Net -1718.29 ml       Labs: Basic Metabolic Panel: Recent Labs  Lab 05/23/20 0659 05/24/20 0500 05/25/20 0300  NA 139 140 140  K 4.2 4.1 3.9  CL 102 101 102  CO2 22 22 24  GLUCOSE 118* 97 86  BUN 271* 269* 191*  CREATININE 2.66* 2.73* 2.21*  CALCIUM 9.3 9.2 9.2  PHOS 10.2* 10.6* 8.3*   Liver Function Tests: Recent Labs  Lab 05/23/20 0659 05/24/20 0500 05/25/20 0300  AST  --  30 29  ALT  --  20 19  ALKPHOS  --  222* 241*  BILITOT  --  1.5* 1.3*  PROT  --  7.8 7.4  ALBUMIN 2.9* 3.4* 3.1*   No results for input(s): LIPASE, AMYLASE in the last 168 hours. No results for input(s): AMMONIA in the last 168 hours. CBC: Recent Labs  Lab 05/21/20 0452 05/21/20 1335 05/22/20 0527 05/22/20 1658 05/23/20 0659 05/25/20 0300  WBC 4.3  --  6.2 5.0 4.9 5.6  HGB 6.7*   < > 8.0* 7.8* 8.0* 8.0*  HCT 21.6*   < > 25.2* 25.4* 24.9* 25.7*    MCV 91.9  --  90.0 91.7 89.6 91.5  PLT 112*  --  125* 120* 111* 138*   < > = values in this interval not displayed.   Cardiac Enzymes: No results for input(s): CKTOTAL, CKMB, CKMBINDEX, TROPONINI in the last 168 hours. CBG: Recent Labs  Lab 05/24/20 0732 05/24/20 1122 05/24/20 2328 05/25/20 0554 05/25/20 0759  GLUCAP 92 89 86 80 74    Iron Studies: No results for input(s): IRON, TIBC, TRANSFERRIN, FERRITIN in the last 72 hours. Studies/Results: No results found.  Medications: Infusions: . sodium chloride 250 mL (05/25/20 0249)  . feeding supplement (OSMOLITE 1.5 CAL)      Scheduled  Medications: . carvedilol  6.25 mg Per Tube BID WC  . chlorhexidine gluconate (MEDLINE KIT)  15 mL Mouth Rinse BID  . Chlorhexidine Gluconate Cloth  6 each Topical Daily  . Chlorhexidine Gluconate Cloth  6 each Topical Q0600  . Chlorhexidine Gluconate Cloth  6 each Topical Q0600  . clonazePAM  0.5 mg Per Tube BID  . darbepoetin (ARANESP) injection - DIALYSIS  60 mcg Intravenous Q Mon-HD  . feeding supplement (PROSource TF)  45 mL Per Tube 5 X Daily  . fentaNYL  1 patch Transdermal Q72H   And  . fentaNYL  1 patch Transdermal Q72H  . fluconazole  50 mg Oral Daily  . levothyroxine  125 mcg Per Tube Q0600  . mouth rinse  15 mL Mouth Rinse 10 times per day  . midazolam  2 mg Intravenous Once  . multivitamin with minerals  1 tablet Per Tube Daily  . nutrition supplement (JUVEN)  1 packet Per Tube BID BM  . pantoprazole sodium  40 mg Per Tube BID  . sertraline  100 mg Per Tube Daily  . sodium chloride flush  10-40 mL Intracatheter Q12H  . traZODone  100 mg Per Tube QHS    have reviewed scheduled and prn medications.  Physical Exam: General: Elderly male, ill looking sitting up in bed.  On vent via tracheostomy Heart:RRR, s1s2 nl, anasarca present Lungs: Basal decreased breath sound, clear anteriorly Abdomen:soft, Non-tender, distended Extremities: Upper and lower extremities edema/anasarca present Neurology: alert awake Dialysis Access: Right IJ temporary HD catheter site clean.  Phillip Klein 05/25/2020,10:30 AM  LOS: 26 days

## 2020-05-25 NOTE — Progress Notes (Signed)
Phone call to pt RN, Bonita Quin. She states that she will need to call back regarding a time to bring pt down to radiology for tube change. I advised that I will await her phone call.

## 2020-05-25 NOTE — Progress Notes (Signed)
PROGRESS NOTE    Phillip Klein  JQZ:[id number] DOB: 09/18/1965 DOA: 04/21/2020 PCP: [name], MD    Brief Narrative: This 55 years old male with PMH significant for chronic hypoxic respiratory failure with tracheostomy and vent since 2021 after C-spine surgery, GERD, DM, anemia of chronic disease, quadriplegia, malnutrition, neurogenic orthostatic hypotension, untreated HCV, previous IV drug use, cirrhosis, chronic pain syndrome was sent from [facility] with septic shock. Patient admitted in the ICU with septic shock secondary to ESBL/ [name] bacteremia. Patient had Hickman's catheter which was removed, presumed to be the source of infection. Patient has completed meropenem for 10 days after removal of the catheter. Patient has developed acute kidney injury secondary to ischemic ATN from pressors requiring septic shock.,Cardiology,Nephrology is following. PCCM is assisting with vent management. Patient had multiple chronic bedsores,wound care is following. Palliative care consulted to discuss goals of care.  Patient eventually started on hemodialysis.  Patient is not a candidate for long-term outpatient dialysis because of poor functional status and multiple comorbidities.  Assessment & Plan:   Principal Problem:   Gram-negative bacteremia Active Problems:   Septic shock (HCC)   Pressure injury of skin   Malnutrition of moderate degree   Encounter for feeding tube placement   Hypotension   Quadriplegia (HCC)   Infection due to ESBL-producing [name] pneumoniae   Chronic hepatitis C without hepatic coma (HCC)   Anemia   Jejunostomy tube leak (HCC)   Pleural effusion   Sinus arrest   Pacemaker   HFrEF (heart failure with reduced ejection fraction) (HCC)   Severe malnutrition (HCC)   Anasarca   Acute renal failure superimposed on stage 3b chronic kidney disease (HCC)   Septic shock, POA secondary to [name] bacteremia, UTI versus line infection   -Hickman catheter presumed to be the source of infection, removed. -Blood cultures + [name] bacteremia, UA positive, culture showed multiple species -Completed meropenem for 10 days on 05/12/2020, repeat blood cultures negative -PICC line inserted on 3/8, venous Doppler showed no DVT. -Sepsis physiology resolved  Active problems Acute kidney injury secondary to ischemic ATN, septic shock requiring pressors, chronic hypotension, anasarca -Patient was placed on albumin and Lasix for anasarca, per nephrology has not been effective, continue Lasix -Nephrology following, not a candidate for long-term dialysis,  however may be an option for management of severe azotemia. -Palliative medicine also following.  Patient and family wants to have a full scope of treatment. -05/22/20: Hemodialysis is planned -05/23/2020: Patient underwent hemodialysis 3/20 and 3/21 with 2 L UF.  Next dialysis 3/22. - Bun has slightly improved.  Acute blood loss anemia with intermittent bleeding from wounds : -Platelet dysfunction in the setting of severe azotemia,  -Nephrology recommended the conjugated estrogen (Premarin) via tube for 5 days, completed 3/20. -Hemoglobin again down to 6.7, received 2 units PRBC -05/26/19: Hemoglobin is 8 g/dL .  Will check stool lactoferrin to rule out GI bleed.    Essential hypertension Continue Coreg, hydralazine IV as needed with parameters   Acute on chronic combined systolic and diastolic CHF exacerbation with anasarca: - Other contributers include severe protein calorie malnutrition with hypoalbuminemia and third spacing -2D echo showed EF of 40 to 45% with global hypokinesis -Nephrology following, treated with albumin/furosemide, fluid restriction, considered short-term hemodialysis -Patient had hemodialysis on 3/20 and 3/21.  For repeat hemodialysis today.  Chronic ventilator dependent respiratory failure, quadriplegia -Management per CCM, continue ventilatory  support with trach. -Continue feeding through tube feed when possible.  The tubes are currently clogged.   -  Interventional radiology team has been consulted to replace G tube. -Patient underwent tracheostomy tube change 3/21.  Hypotension, likely orthostatic hypotension Florinef was discontinued in ICU due to concern for fluid retention, midodrine has been stopped. BP stable.  Chronic pain syndrome -Currently stable, continue fentanyl patch, oxycodone  Depression, insomnia Continue sertraline, trazodone, clonazepam  GERD Continue PPI  Left arm swelling -Venous Dopplers negative for DVT  Hematuria -resolving, urology was consulted, no need for any acute intervention.  Hypothermia:  -Patient was pancultured, cultures no growth so far. -Hold on antibiotics. He just completed meropenum x 10 days. -Continue warming blanket. -Consider ID consult if cultures +  Multiple pressure wounds, present on admission -Full-thickness vertebral column stage IV -Sacrum stage IV, -Right and left ischial tuberosity, stage IV -Right lateral hip, stage IV -Right and left lower leg lateral, unstageable -Right (unstageable now) and left posterior elbow, stage III - left posterior lower arm, unstageable   05/23/2020: Patient has been refusing wound care/dressing.  Obesity Estimated body mass index is 30.43 kg/m as calculated from the following:   Height as of this encounter: 6' 2" (1.88 m).   Weight as of this encounter: 107.5 kg.   DVT prophylaxis:  SCDs Code Status: Full code. Family Communication:  No family at bed side. Disposition Plan:  Status is: Inpatient  Remains inpatient appropriate because:Inpatient level of care appropriate due to severity of illness   Dispo: The patient is from: SNF              Anticipated d/c is to: LTAC              Patient currently is not medically stable to d/c.   Difficult to place patient Yes   Transferred back to [facility]  pending bed availability and cleared from nephrology.   Consultants:   PCCM  Cardiology  Nephrology  IR  Palliative care  Procedures:  Antimicrobials: Anti-infectives (From admission, onward)   Start     Dose/Rate Route Frequency Ordered Stop   05/19/20 1000  fluconazole (DIFLUCAN) tablet 50 mg       "Followed by" Linked Group Details   50 mg Oral Daily 05/18/20 1048 05/28/20 0959   05/18/20 1145  fluconazole (DIFLUCAN) tablet 100 mg       "Followed by" Linked Group Details   100 mg Oral  Once 05/18/20 1048 05/18/20 1145   05/11/20 0800  meropenem (MERREM) 1 g in sodium chloride 0.9 % 100 mL IVPB        1 g 200 mL/hr over 30 Minutes Intravenous Every 8 hours 05/11/20 0707 05/12/20 1555   05/11/20 0715  meropenem (MERREM) 1 g in sodium chloride 0.9 % 100 mL IVPB  Status:  Discontinued        1 g 200 mL/hr over 30 Minutes Intravenous Every 8 hours 05/11/20 0706 05/11/20 0707   05/03/20 2200  meropenem (MERREM) 1 g in sodium chloride 0.9 % 100 mL IVPB  Status:  Discontinued        1 g 200 mL/hr over 30 Minutes Intravenous Every 12 hours 05/03/20 1041 05/11/20 0706   04/30/20 1145  meropenem (MERREM) 2 g in sodium chloride 0.9 % 100 mL IVPB  Status:  Discontinued        2 g 200 mL/hr over 30 Minutes Intravenous Every 12 hours 04/30/20 1052 05/03/20 1041   04/30/20 0530  ceFEPIme (MAXIPIME) 2 g in sodium chloride 0.9 % 100 mL IVPB  Status:  Discontinued  2 g 200 mL/hr over 30 Minutes Intravenous Every 12 hours 04/13/2020 1852 04/30/20 1051   04/28/2020 1852  vancomycin variable dose per unstable renal function (pharmacist dosing)  Status:  Discontinued         Does not apply See admin instructions 04/16/2020 1852 04/30/20 1051   05/02/2020 1630  vancomycin (VANCOREADY) IVPB 2000 mg/400 mL        2,000 mg 200 mL/hr over 120 Minutes Intravenous  Once 04/25/2020 1621 04/13/2020 2008   04/30/2020 1630  ceFEPIme (MAXIPIME) 2 g in sodium chloride 0.9 % 100 mL IVPB        2 g 200 mL/hr  over 30 Minutes Intravenous  Once 04/20/2020 1621 04/15/2020 1749     Subjective: Patient was seen and examined at bedside.  Overnight events noted.  He is still on warming blanket, temp has improved..  Patient still appears pretty swollen with scrotal edema and generalized anasarca.  He had hemodialysis yesterday, scheduled to have hemodialysis today.   His tracheostomy tube was changed yesterday.  Objective: Vitals:   05/25/20 0400 05/25/20 0500 05/25/20 0600 05/25/20 0713  BP: (!) 145/90 (!) 150/97 (!) 155/94 (!) 154/96  Pulse: 66 84 71 62  Resp: _0 (!) 22  Temp: 97.7 F (36.5 C) (!) 97.3 F (36.3 C) (!) 96.8 F (36 C) (!) 96.44 F (35.8 C)  TempSrc:   Bladder   SpO2: 97% 96% 97% 93%  Weight:      Height:        Intake/Output Summary (Last 24 hours) at 05/25/2020 1116 Last data filed at 05/25/2020 0501 Gross per 24 hour  Intake 499.71 ml  Output 2245 ml  Net -1745.29 ml   Filed Weights   05/24/20 0500 05/25/20 0335  Weight: 108.9 kg 108.3 kg    Examination:  General exam: Appears calm and comfortable, not in any distress Respiratory system: Clear to auscultation. Respiratory effort normal. Cardiovascular system: S1 & S2 heard, RRR. No JVD, murmurs, rubs, gallops or clicks. +++ pedal edema. Gastrointestinal system: Abdomen is nondistended, soft and nontender. No organomegaly or masses felt.  Normal bowel sounds heard. Colostomy bag on left. Central nervous system: Alert and oriented x 2. No focal neurological deficits. Extremities: Bilateral heal protectors, Pitting edema, Anasarca. Skin: No rashes, lesions or ulcers Psychiatry: Judgement and insight appear normal. Mood & affect appropriate.     Data Reviewed: I have personally reviewed following labs and imaging studies  CBC: Recent Labs  Lab 05/21/20 0452 05/21/20 1335 05/22/20 0527 05/22/20 1658 05/23/20 0659 05/25/20 0300  WBC 4.3  --  6.2 5.0 4.9 5.6  HGB 6.7* 7.6* 8.0* 7.8* 8.0* 8.0*  HCT 21.6*  24.3* 25.2* 25.4* 24.9* 25.7*  MCV 91.9  --  90.0 91.7 89.6 91.5  PLT 112*  --  125* 120* 111* 476*   Basic Metabolic Panel: Recent Labs  Lab 05/19/20 0224 05/20/20 0511 05/22/20 1658 05/23/20 0531 05/23/20 0659 05/24/20 0500 05/25/20 0300  NA 139   < > 139 138 139 140 140  K 4.9   < > 4.5 4.1 4.2 4.1 3.9  CL 102   < > 101 102 102 101 102  CO2 25   < > _1 GLUCOSE 116*   < > 148* 114* 118* 97 86  BUN 224*   < > 275* 270* 271* 269* 191*  CREATININE 2.33*   < > 2.70* 2.57* 2.66* 2.73* 2.21*  CALCIUM 9.0   < > 8.9  9.1 9.3 9.2 9.2  MG 2.0  --   --   --   --   --  2.0  PHOS 9.4*   < > 10.2* 10.1* 10.2* 10.6* 8.3*   < > = values in this interval not displayed.   GFR: Estimated Creatinine Clearance: 51.2 mL/min (A) (by C-G formula based on SCr of 2.21 mg/dL (H)). Liver Function Tests: Recent Labs  Lab 05/22/20 1658 05/23/20 0531 05/23/20 0659 05/24/20 0500 05/25/20 0300  AST  --   --   --  30 29  ALT  --   --   --  20 19  ALKPHOS  --   --   --  222* 241*  BILITOT  --   --   --  1.5* 1.3*  PROT  --   --   --  7.8 7.4  ALBUMIN 2.3* 2.7* 2.9* 3.4* 3.1*   No results for input(s): LIPASE, AMYLASE in the last 168 hours. No results for input(s): AMMONIA in the last 168 hours. Coagulation Profile: No results for input(s): INR, PROTIME in the last 168 hours. Cardiac Enzymes: No results for input(s): CKTOTAL, CKMB, CKMBINDEX, TROPONINI in the last 168 hours. BNP (last 3 results) No results for input(s): PROBNP in the last 8760 hours. HbA1C: No results for input(s): HGBA1C in the last 72 hours. CBG: Recent Labs  Lab 05/24/20 1122 05/24/20 2328 05/25/20 0554 05/25/20 0759 05/25/20 1101  GLUCAP 89 86 80 74 76   Lipid Profile: No results for input(s): CHOL, HDL, LDLCALC, TRIG, CHOLHDL, LDLDIRECT in the last 72 hours. Thyroid Function Tests: No results for input(s): TSH, T4TOTAL, FREET4, T3FREE, THYROIDAB in the last 72 hours. Anemia Panel: No results for  input(s): VITAMINB12, FOLATE, FERRITIN, TIBC, IRON, RETICCTPCT in the last 72 hours. Sepsis Labs: No results for input(s): PROCALCITON, LATICACIDVEN in the last 168 hours.  Recent Results (from the past 240 hour(s))  Culture, Urine     Status: Abnormal   Collection Time: 05/23/20  9:31 AM   Specimen: Urine, Random  Result Value Ref Range Status   Specimen Description URINE, RANDOM  Final   Special Requests   Final    NONE Performed at [facility], [address], [place], [place] [address]    Culture MULTIPLE SPECIES PRESENT, SUGGEST RECOLLECTION (A)  Final   Report Status 05/24/2020 FINAL  Final  Culture, blood (routine x 2)     Status: None (Preliminary result)   Collection Time: 05/23/20 10:02 AM   Specimen: BLOOD LEFT HAND  Result Value Ref Range Status   Specimen Description BLOOD LEFT HAND  Final   Special Requests   Final    BOTTLES DRAWN AEROBIC ONLY Blood Culture results may not be optimal due to an inadequate volume of blood received in culture bottles   Culture   Final    NO GROWTH 2 DAYS Performed at [facility], [place] [address] [place], [place] [address]    Report Status PENDING  Incomplete  Culture, blood (routine x 2)     Status: None (Preliminary result)   Collection Time: 05/23/20 10:17 AM   Specimen: BLOOD LEFT HAND  Result Value Ref Range Status   Specimen Description BLOOD LEFT HAND  Final   Special Requests   Final    BOTTLES DRAWN AEROBIC ONLY Blood Culture results may not be optimal due to an inadequate volume of blood received in culture bottles   Culture   Final    NO GROWTH  2 DAYS Performed at [facility], [place] [address] [place], [place] [address]    Report Status PENDING  Incomplete  Culture, Respiratory w Gram Stain     Status: None   Collection Time: 05/23/20  5:12 PM   Specimen: Tracheal Aspirate; Respiratory  Result Value Ref Range Status   Specimen Description TRACHEAL ASPIRATE  Final   Special Requests NONE  Final    Gram Stain   Final    MODERATE WBC PRESENT, PREDOMINANTLY PMN ABUNDANT GRAM POSITIVE COCCI IN PAIRS IN CHAINS Performed at [facility], [place] [address], [place], [place] [address]    Culture   Final    MODERATE METHICILLIN RESISTANT STAPHYLOCOCCUS AUREUS   Report Status 05/25/2020 FINAL  Final   Organism ID, Bacteria METHICILLIN RESISTANT STAPHYLOCOCCUS AUREUS  Final      Susceptibility   Methicillin resistant staphylococcus aureus - MIC*    CIPROFLOXACIN >=8 RESISTANT Resistant     ERYTHROMYCIN >=8 RESISTANT Resistant     GENTAMICIN <=0.5 SENSITIVE Sensitive     OXACILLIN >=4 RESISTANT Resistant     TETRACYCLINE <=1 SENSITIVE Sensitive     VANCOMYCIN <=0.5 SENSITIVE Sensitive     TRIMETH/SULFA <=10 SENSITIVE Sensitive     CLINDAMYCIN >=8 RESISTANT Resistant     RIFAMPIN <=0.5 SENSITIVE Sensitive     Inducible Clindamycin NEGATIVE Sensitive     * MODERATE METHICILLIN RESISTANT STAPHYLOCOCCUS AUREUS    Radiology Studies: No results found. Scheduled Meds: . carvedilol  6.25 mg Per Tube BID WC  . chlorhexidine gluconate (MEDLINE KIT)  15 mL Mouth Rinse BID  . Chlorhexidine Gluconate Cloth  6 each Topical Daily  . Chlorhexidine Gluconate Cloth  6 each Topical Q0600  . Chlorhexidine Gluconate Cloth  6 each Topical Q0600  . clonazePAM  0.5 mg Per Tube BID  . darbepoetin (ARANESP) injection - DIALYSIS  60 mcg Intravenous Q Mon-HD  . feeding supplement (PROSource TF)  45 mL Per Tube 5 X Daily  . fentaNYL  1 patch Transdermal Q72H   And  . fentaNYL  1 patch Transdermal Q72H  . fluconazole  50 mg Oral Daily  . levothyroxine  125 mcg Per Tube Q0600  . mouth rinse  15 mL Mouth Rinse 10 times per day  . midazolam  2 mg Intravenous Once  . multivitamin with minerals  1 tablet Per Tube Daily  . nutrition supplement (JUVEN)  1 packet Per Tube BID BM  . pantoprazole sodium  40 mg Per Tube BID  . sertraline  100 mg Per Tube Daily  . sodium chloride flush  10-40 mL Intracatheter Q12H   . traZODone  100 mg Per Tube QHS   Continuous Infusions: . sodium chloride 250 mL (05/25/20 0249)  . feeding supplement (OSMOLITE 1.5 CAL)       LOS: 26 days    Time spent: 35 mins     , MD [facility]   If 7PM-7AM, please contact night-coverage

## 2020-05-25 NOTE — Progress Notes (Signed)
Received phone call from pt RN Bonita Quin, that pt will not be finished with dialysis until 4pm. I advised that this will be too late to bring the pt to IR. I advised that I will call RN tomorrow to try to figure out a time we can coordinate for pt to come down to IR for procedure.

## 2020-05-26 ENCOUNTER — Inpatient Hospital Stay (HOSPITAL_COMMUNITY): Payer: Medicare Other

## 2020-05-26 DIAGNOSIS — I959 Hypotension, unspecified: Secondary | ICD-10-CM | POA: Diagnosis not present

## 2020-05-26 DIAGNOSIS — R7881 Bacteremia: Secondary | ICD-10-CM | POA: Diagnosis not present

## 2020-05-26 DIAGNOSIS — A4902 Methicillin resistant Staphylococcus aureus infection, unspecified site: Secondary | ICD-10-CM | POA: Diagnosis not present

## 2020-05-26 DIAGNOSIS — J90 Pleural effusion, not elsewhere classified: Secondary | ICD-10-CM

## 2020-05-26 DIAGNOSIS — J15212 Pneumonia due to Methicillin resistant Staphylococcus aureus: Secondary | ICD-10-CM | POA: Diagnosis not present

## 2020-05-26 DIAGNOSIS — A4159 Other Gram-negative sepsis: Secondary | ICD-10-CM | POA: Diagnosis not present

## 2020-05-26 DIAGNOSIS — B182 Chronic viral hepatitis C: Secondary | ICD-10-CM | POA: Diagnosis not present

## 2020-05-26 HISTORY — PX: IR GJ TUBE CHANGE: IMG1440

## 2020-05-26 LAB — COMPREHENSIVE METABOLIC PANEL WITH GFR
ALT: 17 U/L (ref 0–44)
AST: 26 U/L (ref 15–41)
Albumin: 2.9 g/dL — ABNORMAL LOW (ref 3.5–5.0)
Alkaline Phosphatase: 226 U/L — ABNORMAL HIGH (ref 38–126)
Anion gap: 13 (ref 5–15)
BUN: 154 mg/dL — ABNORMAL HIGH (ref 6–20)
CO2: 24 mmol/L (ref 22–32)
Calcium: 9.2 mg/dL (ref 8.9–10.3)
Chloride: 101 mmol/L (ref 98–111)
Creatinine, Ser: 2.07 mg/dL — ABNORMAL HIGH (ref 0.61–1.24)
GFR, Estimated: 38 mL/min — ABNORMAL LOW
Glucose, Bld: 92 mg/dL (ref 70–99)
Potassium: 3.6 mmol/L (ref 3.5–5.1)
Sodium: 138 mmol/L (ref 135–145)
Total Bilirubin: 1.2 mg/dL (ref 0.3–1.2)
Total Protein: 7.7 g/dL (ref 6.5–8.1)

## 2020-05-26 LAB — GLUCOSE, CAPILLARY
Glucose-Capillary: 100 mg/dL — ABNORMAL HIGH (ref 70–99)
Glucose-Capillary: 98 mg/dL (ref 70–99)
Glucose-Capillary: 86 mg/dL (ref 70–99)
Glucose-Capillary: 91 mg/dL (ref 70–99)

## 2020-05-26 LAB — CBC
HCT: 25.5 % — ABNORMAL LOW (ref 39.0–52.0)
Hemoglobin: 8 g/dL — ABNORMAL LOW (ref 13.0–17.0)
MCH: 28.6 pg (ref 26.0–34.0)
MCHC: 31.4 g/dL (ref 30.0–36.0)
MCV: 91.1 fL (ref 80.0–100.0)
Platelets: 121 K/uL — ABNORMAL LOW (ref 150–400)
RBC: 2.8 MIL/uL — ABNORMAL LOW (ref 4.22–5.81)
RDW: 15.7 % — ABNORMAL HIGH (ref 11.5–15.5)
WBC: 5.6 K/uL (ref 4.0–10.5)
nRBC: 0 % (ref 0.0–0.2)

## 2020-05-26 LAB — MAGNESIUM: Magnesium: 1.9 mg/dL (ref 1.7–2.4)

## 2020-05-26 LAB — PHOSPHORUS: Phosphorus: 7.6 mg/dL — ABNORMAL HIGH (ref 2.5–4.6)

## 2020-05-26 MED ORDER — LIDOCAINE VISCOUS HCL 2 % MT SOLN
OROMUCOSAL | Status: AC
  Filled 2020-05-26: qty 15

## 2020-05-26 MED ORDER — IOHEXOL 300 MG/ML  SOLN: 50.0000 mL | Freq: Once | INTRAMUSCULAR | Status: DC | PRN

## 2020-05-26 NOTE — Progress Notes (Signed)
ICU RN called this RN back to say that the pt will be able to be picked up at 330pm to come down for procedure in IR. Will send for pt at this time.

## 2020-05-26 NOTE — Progress Notes (Signed)
PROGRESS NOTE    Phillip Klein  ZOX:[id number] DOB: 1965-08-03 DOA: 04/08/2020 PCP: [name], MD    Brief Narrative: This 55 years old male with PMH significant for chronic hypoxic respiratory failure with tracheostomy and vent since 2021 after C-spine surgery, GERD, DM, anemia of chronic disease, quadriplegia, malnutrition, neurogenic orthostatic hypotension, untreated HCV, previous IV drug use, cirrhosis, chronic pain syndrome was sent from [facility] with septic shock. Patient admitted in the ICU with septic shock secondary to ESBL/ [name] bacteremia. Patient had Hickman's catheter which was removed, presumed to be the source of infection. Patient has completed meropenem for 10 days after removal of the catheter. Patient has developed acute kidney injury secondary to ischemic ATN from pressors requiring septic shock.,Cardiology,Nephrology is following. PCCM is assisting with vent management. Patient had multiple chronic bedsores,wound care is following. Palliative care consulted to discuss goals of care.  Patient eventually started on hemodialysis.  Patient is not a candidate for long-term outpatient dialysis because of poor functional status and multiple comorbidities.  Palliative care consulted and patient and family wants full scope of treatment.  Assessment & Plan:   Principal Problem:   MRSA Pneumonia Active Problems:   Septic shock ([place])   Pressure injury of skin   Malnutrition of moderate degree   Encounter for feeding tube placement   Hypotension   Quadriplegia (HCC)   Gram-negative bacteremia   Infection due to ESBL-producing [name] pneumoniae   Chronic hepatitis C without hepatic coma (HCC)   Anemia   Jejunostomy tube leak (HCC)   Pleural effusion   Sinus arrest   Pacemaker   HFrEF (heart failure with reduced ejection fraction) (HCC)   Severe malnutrition (HCC)   Anasarca   Acute renal failure superimposed on stage 3b chronic kidney  disease (HCC)   Septic shock, POA secondary to [name] bacteremia, UTI versus line infection  -Hickman catheter presumed to be the source of infection, removed. -Blood cultures + [name] bacteremia, UA positive, culture showed multiple species -Completed meropenem for 10 days on 05/12/2020, repeat blood cultures negative -PICC line inserted on 3/8, venous Doppler showed no DVT. -Sepsis physiology resolved.   Active problems Acute kidney injury secondary to ischemic ATN, septic shock requiring pressors, chronic hypotension, anasarca -Patient was placed on albumin and Lasix for anasarca, per nephrology has not been effective. -Nephrology following, not a candidate for long-term dialysis,  however may be an option for management of severe azotemia. -Palliative medicine also following.  Patient and family wants to have a full scope of treatment. -05/22/20: Hemodialysis is planned -05/23/2020: Patient underwent hemodialysis 3/20, 3/21 and 2/22 with 2 L UF.  Next dialysis 3/24. - Bun has slightly improved.  Acute blood loss anemia with intermittent bleeding from wounds : -Platelet dysfunction in the setting of severe azotemia,  -Nephrology recommended the conjugated estrogen (Premarin) via tube for 5 days, completed 3/20. -Hemoglobin again down to 6.7, received 2 units PRBC -05/26/19: Hemoglobin is 8 g/dL .    Essential hypertension Continue Coreg, hydralazine IV as needed with parameters.  Acute on chronic combined systolic and diastolic CHF exacerbation with anasarca: - Other contributers include severe protein calorie malnutrition with hypoalbuminemia and third spacing -2D echo showed EF of 40 to 45% with global hypokinesis -Nephrology following, treated with albumin/furosemide, fluid restriction, considered short-term hemodialysis -Patient had hemodialysis on 3/20, 3/21 and 3/22.  Next dialysis 3/24.  Chronic ventilator dependent respiratory failure, quadriplegia -Management  per CCM, continue ventilatory support with trach. -Continue feeding through tube feed when possible.  The tubes are currently clogged.   -Interventional radiology team has been consulted to replace G tube. -Patient underwent tracheostomy tube change 3/21.  Hypotension, likely orthostatic hypotension Florinef was discontinued in ICU due to concern for fluid retention, midodrine has been stopped. BP stable.  Chronic pain syndrome -Currently stable, continue fentanyl patch, oxycodone  Depression, insomnia Continue sertraline, trazodone, clonazepam  GERD Continue PPI  Left arm swelling -Venous Dopplers negative for DVT  Hematuria -resolving, urology was consulted, no need for any acute intervention.  Hypothermia:  -Patient was pancultured, cultures no growth so far. -Hold on antibiotics. He just completed meropenum x 10 days. -Continue warming blanket. -ID consulted, MRSA +, chest x-ray consistent with pneumonia -Started on linezolid, follow-up ID recommendation.  Multiple pressure wounds, present on admission -Full-thickness vertebral column stage IV -Sacrum stage IV, -Right and left ischial tuberosity, stage IV -Right lateral hip, stage IV -Right and left lower leg lateral, unstageable -Right (unstageable now) and left posterior elbow, stage III - left posterior lower arm, unstageable   05/23/2020: Patient has been refusing wound care/dressing.  Obesity Estimated body mass index is 30.43 kg/m as calculated from the following:   Height as of this encounter: _0  (1.88 m).   Weight as of this encounter: 107.5 kg.   DVT prophylaxis:  SCDs Code Status: Full code. Family Communication:  No family at bed side. Disposition Plan:  Status is: Inpatient  Remains inpatient appropriate because:Inpatient level of care appropriate due to severity of illness   Dispo: The patient is from: SNF              Anticipated d/c is to: LTAC              Patient currently is  not medically stable to d/c.   Difficult to place patient Yes   Transferred back to [facility] or [facility] pending bed availability and cleared from nephrology.   Consultants:   PCCM  Cardiology  Nephrology  IR  Palliative care  Procedures:  Antimicrobials: Anti-infectives (From admission, onward)   Start     Dose/Rate Route Frequency Ordered Stop   05/25/20 1845  linezolid (ZYVOX) IVPB 600 mg        600 mg 300 mL/hr over 60 Minutes Intravenous Every 12 hours 05/25/20 1748     05/19/20 1000  fluconazole (DIFLUCAN) tablet 50 mg       "Followed by" Linked Group Details   50 mg Oral Daily 05/18/20 1048 05/28/20 0959   05/18/20 1145  fluconazole (DIFLUCAN) tablet 100 mg       "Followed by" Linked Group Details   100 mg Oral  Once 05/18/20 1048 05/18/20 1145   05/11/20 0800  meropenem (MERREM) 1 g in sodium chloride 0.9 % 100 mL IVPB        1 g 200 mL/hr over 30 Minutes Intravenous Every 8 hours 05/11/20 0707 05/12/20 1555   05/11/20 0715  meropenem (MERREM) 1 g in sodium chloride 0.9 % 100 mL IVPB  Status:  Discontinued        1 g 200 mL/hr over 30 Minutes Intravenous Every 8 hours 05/11/20 0706 05/11/20 0707   05/03/20 2200  meropenem (MERREM) 1 g in sodium chloride 0.9 % 100 mL IVPB  Status:  Discontinued        1 g 200 mL/hr over 30 Minutes Intravenous Every 12 hours 05/03/20 1041 05/11/20 0706   04/30/20 1145  meropenem (MERREM) 2 g in sodium chloride 0.9 % 100 mL IVPB  Status:  Discontinued        2 g 200 mL/hr over 30 Minutes Intravenous Every 12 hours 04/30/20 1052 05/03/20 1041   04/30/20 0530  ceFEPIme (MAXIPIME) 2 g in sodium chloride 0.9 % 100 mL IVPB  Status:  Discontinued        2 g 200 mL/hr over 30 Minutes Intravenous Every 12 hours 04/13/2020 1852 04/30/20 1051   04/24/2020 1852  vancomycin variable dose per unstable renal function (pharmacist dosing)  Status:  Discontinued         Does not apply See admin instructions 04/17/2020 1852 04/30/20 1051   04/15/2020 1630   vancomycin (VANCOREADY) IVPB 2000 mg/400 mL        2,000 mg 200 mL/hr over 120 Minutes Intravenous  Once 05/02/2020 1621 04/19/2020 2008   04/23/2020 1630  ceFEPIme (MAXIPIME) 2 g in sodium chloride 0.9 % 100 mL IVPB        2 g 200 mL/hr over 30 Minutes Intravenous  Once 04/26/2020 1621 05/03/2020 1749     Subjective: Patient was seen and examined at bedside.  Overnight events noted. He is still on warming blanket, temp has improved..   Patient still appears pretty swollen with scrotal edema and generalized anasarca.  He reports feeling better, renal functions improving.  Objective: Vitals:   05/26/20 1000 05/26/20 1100 05/26/20 1104 05/26/20 1200  BP: (!) 154/84 (!) 151/87  (!) 158/89  Pulse: 63 64  61  Resp:    18  Temp:   (!) 97.4 F (36.3 C)   TempSrc:   Oral   SpO2:    94%  Weight:      Height:        Intake/Output Summary (Last 24 hours) at 05/26/2020 1234 Last data filed at 05/26/2020 1217 Gross per 24 hour  Intake 646.54 ml  Output 2570 ml  Net -1923.46 ml   Filed Weights   05/24/20 0500 05/25/20 0335 05/26/20 0500  Weight: 108.9 kg 108.3 kg 108.8 kg    Examination:  General exam: Appears calm and comfortable, not in any distress. Respiratory system: Clear to auscultation. Respiratory effort normal. Cardiovascular system: S1 & S2 heard, RRR. No JVD, murmurs, rubs, gallops or clicks. +++ pedal edema. Gastrointestinal system: Abdomen is nondistended, soft and nontender. No organomegaly or masses felt.  Normal bowel sounds heard. Colostomy bag on left. Central nervous system: Alert and oriented x 2. No focal neurological deficits. Extremities: Bilateral heal protectors, Pitting edema, Anasarca. Skin: Multiple pressure ulcers in multiple locations Psychiatry: Judgement and insight appear normal. Mood & affect appropriate.     Data Reviewed: I have personally reviewed following labs and imaging studies  CBC: Recent Labs  Lab 05/22/20 0527 05/22/20 1658 05/23/20 0659  05/25/20 0300 05/26/20 0430  WBC 6.2 5.0 4.9 5.6 5.6  HGB 8.0* 7.8* 8.0* 8.0* 8.0*  HCT 25.2* 25.4* 24.9* 25.7* 25.5*  MCV 90.0 91.7 89.6 91.5 91.1  PLT 125* 120* 111* 138* 943*   Basic Metabolic Panel: Recent Labs  Lab 05/23/20 0531 05/23/20 0659 05/24/20 0500 05/25/20 0300 05/26/20 0430  NA 138 139 140 140 138  K 4.1 4.2 4.1 3.9 3.6  CL 102 102 101 102 101  CO2 _0 GLUCOSE 114* 118* 97 86 92  BUN 270* 271* 269* 191* 154*  CREATININE 2.57* 2.66* 2.73* 2.21* 2.07*  CALCIUM 9.1 9.3 9.2 9.2 9.2  MG  --   --   --  2.0 1.9  PHOS 10.1* 10.2* 10.6* 8.3* 7.6*  GFR: Estimated Creatinine Clearance: 54.8 mL/min (A) (by C-G formula based on SCr of 2.07 mg/dL (H)). Liver Function Tests: Recent Labs  Lab 05/23/20 0531 05/23/20 0659 05/24/20 0500 05/25/20 0300 05/26/20 0430  AST  --   --  _0 ALT  --   --  _1 ALKPHOS  --   --  222* 241* 226*  BILITOT  --   --  1.5* 1.3* 1.2  PROT  --   --  7.8 7.4 7.7  ALBUMIN 2.7* 2.9* 3.4* 3.1* 2.9*   No results for input(s): LIPASE, AMYLASE in the last 168 hours. No results for input(s): AMMONIA in the last 168 hours. Coagulation Profile: No results for input(s): INR, PROTIME in the last 168 hours. Cardiac Enzymes: No results for input(s): CKTOTAL, CKMB, CKMBINDEX, TROPONINI in the last 168 hours. BNP (last 3 results) No results for input(s): PROBNP in the last 8760 hours. HbA1C: No results for input(s): HGBA1C in the last 72 hours. CBG: Recent Labs  Lab 05/25/20 1642 05/25/20 1938 05/25/20 2324 05/26/20 0715 05/26/20 1102  GLUCAP 72 79 89 100* 98   Lipid Profile: No results for input(s): CHOL, HDL, LDLCALC, TRIG, CHOLHDL, LDLDIRECT in the last 72 hours. Thyroid Function Tests: No results for input(s): TSH, T4TOTAL, FREET4, T3FREE, THYROIDAB in the last 72 hours. Anemia Panel: No results for input(s): VITAMINB12, FOLATE, FERRITIN, TIBC, IRON, RETICCTPCT in the last 72 hours. Sepsis Labs: No  results for input(s): PROCALCITON, LATICACIDVEN in the last 168 hours.  Recent Results (from the past 240 hour(s))  Culture, Urine     Status: Abnormal   Collection Time: 05/23/20  9:31 AM   Specimen: Urine, Random  Result Value Ref Range Status   Specimen Description URINE, RANDOM  Final   Special Requests   Final    NONE Performed at [facility], [address], [place], [place] [address]    Culture MULTIPLE SPECIES PRESENT, SUGGEST RECOLLECTION (A)  Final   Report Status 05/24/2020 FINAL  Final  Culture, blood (routine x 2)     Status: None (Preliminary result)   Collection Time: 05/23/20 10:02 AM   Specimen: BLOOD LEFT HAND  Result Value Ref Range Status   Specimen Description BLOOD LEFT HAND  Final   Special Requests   Final    BOTTLES DRAWN AEROBIC ONLY Blood Culture results may not be optimal due to an inadequate volume of blood received in culture bottles   Culture   Final    NO GROWTH 3 DAYS Performed at [facility], [place] [address], [place], [place] [address]    Report Status PENDING  Incomplete  Culture, blood (routine x 2)     Status: None (Preliminary result)   Collection Time: 05/23/20 10:17 AM   Specimen: BLOOD LEFT HAND  Result Value Ref Range Status   Specimen Description BLOOD LEFT HAND  Final   Special Requests   Final    BOTTLES DRAWN AEROBIC ONLY Blood Culture results may not be optimal due to an inadequate volume of blood received in culture bottles   Culture   Final    NO GROWTH 3 DAYS Performed at [facility], [place] [address], [place], [place] [address]    Report Status PENDING  Incomplete  Culture, Respiratory w Gram Stain     Status: None   Collection Time: 05/23/20  5:12 PM   Specimen: Tracheal Aspirate; Respiratory  Result Value Ref Range Status   Specimen  Description TRACHEAL ASPIRATE  Final   Special Requests NONE  Final   Gram Stain   Final    MODERATE WBC PRESENT, PREDOMINANTLY PMN ABUNDANT GRAM POSITIVE COCCI IN PAIRS  IN CHAINS Performed at [facility], [place] [address], [place], [place] [address]    Culture   Final    MODERATE METHICILLIN RESISTANT STAPHYLOCOCCUS AUREUS   Report Status 05/25/2020 FINAL  Final   Organism ID, Bacteria METHICILLIN RESISTANT STAPHYLOCOCCUS AUREUS  Final      Susceptibility   Methicillin resistant staphylococcus aureus - MIC*    CIPROFLOXACIN >=8 RESISTANT Resistant     ERYTHROMYCIN >=8 RESISTANT Resistant     GENTAMICIN <=0.5 SENSITIVE Sensitive     OXACILLIN >=4 RESISTANT Resistant     TETRACYCLINE <=1 SENSITIVE Sensitive     VANCOMYCIN <=0.5 SENSITIVE Sensitive     TRIMETH/SULFA <=10 SENSITIVE Sensitive     CLINDAMYCIN >=8 RESISTANT Resistant     RIFAMPIN <=0.5 SENSITIVE Sensitive     Inducible Clindamycin NEGATIVE Sensitive     * MODERATE METHICILLIN RESISTANT STAPHYLOCOCCUS AUREUS    Radiology Studies: DG CHEST PORT 1 VIEW  Result Date: 05/25/2020 CLINICAL DATA:  Pneumonia EXAM: PORTABLE CHEST 1 VIEW COMPARISON:  May 12, 2020 FINDINGS: Tracheostomy catheter tip is 4.2 cm above the carina. Central catheter tips are in the superior vena cava. No pneumothorax. There are small pleural effusions bilaterally. There is airspace opacity in the right mid lung region as well as in each lung base region. Heart is enlarged with mild pulmonary venous hypertension. No adenopathy. Postoperative change noted in lower cervical and upper thoracic regions. Apparent leadless pacemaker present inferiorly slightly to the left of midline. IMPRESSION: Cardiomegaly with pulmonary vascular congestion. Small pleural effusions bilaterally. Airspace opacity in the right mid lung and bibasilar regions concerning for areas of pneumonia, although alveolar edema could present in this manner. Both edema and pneumonia may present concurrently. Leadless pacemaker present on the left inferiorly. Tube and catheter positions as described. No evident pneumothorax. Electronically Signed   By: Lowella Grip III M.D.   On: 05/25/2020 15:28   Scheduled Meds: . carvedilol  6.25 mg Per Tube BID WC  . chlorhexidine gluconate (MEDLINE KIT)  15 mL Mouth Rinse BID  . Chlorhexidine Gluconate Cloth  6 each Topical Daily  . Chlorhexidine Gluconate Cloth  6 each Topical Q0600  . Chlorhexidine Gluconate Cloth  6 each Topical Q0600  . clonazePAM  0.5 mg Per Tube BID  . darbepoetin (ARANESP) injection - DIALYSIS  60 mcg Intravenous Q Mon-HD  . feeding supplement (PROSource TF)  45 mL Per Tube 5 X Daily  . fentaNYL  1 patch Transdermal Q72H   And  . fentaNYL  1 patch Transdermal Q72H  . fluconazole  50 mg Oral Daily  . levothyroxine  125 mcg Per Tube Q0600  . mouth rinse  15 mL Mouth Rinse 10 times per day  . midazolam  2 mg Intravenous Once  . multivitamin with minerals  1 tablet Per Tube Daily  . nutrition supplement (JUVEN)  1 packet Per Tube BID BM  . pantoprazole sodium  40 mg Per Tube BID  . sodium chloride flush  10-40 mL Intracatheter Q12H   Continuous Infusions: . sodium chloride 10 mL/hr at 05/26/20 0518  . feeding supplement (OSMOLITE 1.5 CAL)    . linezolid (ZYVOX) IV 600 mg (05/26/20 7026)     LOS: 27 days    Time spent: 35 mins  Shawna Clamp, MD [facility]   If 7PM-7AM, please contact night-coverage

## 2020-05-26 NOTE — Progress Notes (Addendum)
2:50pm: CSW received return email from [facility] at [facility] stating the issues that need to be resolved prior to the patient returning to the facility. CSW notified Dr. Cipriano Bunker of information - MD states patient is not medically stable for discharge at this time.  11:50am: CSW spoke with Prem at [facility] - Prem states MD's at facility were concerned with patient returning to SNF level of care due to his hemglobin flucuating while he was there. CSW sent updated clinicals to [facility] for review.  Edwin Dada, MSW, LCSW Transitions of Care  Clinical Social Worker II [phone]

## 2020-05-26 NOTE — Progress Notes (Signed)
[facility] NEPHROLOGY PROGRESS NOTE  Assessment/ Plan:  # Acute kidney Injury: Secondary to ischemic ATN from pressor requiring septic shock (with background history of chronic hypotension). Started dialysis for fluid overload refractory to IV diuretics and azotemia on 3/20. Temporary catheter placed with IR on 3/19 As per patient's [facility], the patient has had dialysis in the past and then the kidney function improved.  She wants to try this time as well.  She is aware that patient is not a candidate for long-term outpatient dialysis because of poor functional status and multiple comorbidities.  -I do believe his Cr is overestimated due to lack of muscle mass, kidney function likely lower/worse - slow start protocol to prevent dialysis dysequilibrium syndrome. Tentative plan is to do HD again tomrow on Thursday, will still use slow flow rates given significant azotemia  # Anemia with intermittent bleeding from wounds: Secondary to platelet dysfunction in the setting of severe azotemia.  Received blood transfusion.  Hemoglobin is stable today.  # Sepsis secondary to [name] bacteremia: abx per primary service  # Severe protein calorie malnutrition: Ongoing supplemental nutrition/tube feeds.  # History of diastolic heart failure/anasarca: He has significant anasarca in part from his protein calorie malnutrition/low albumin and associated third spacing with preceding use of Florinef.   Treating with Albumin/furosemide, fluid restriction. UF with HD.   # Chronic ventilator dependent respiratory failure, quadriplegia and multiple pressure wounds/ulcers: With ongoing management for chronic pain/blood loss anemia and awaiting transfer to LTAC.  Subjective: Seen and examined in ICU. Tolerated HD yesterday, net uf 2L. Nasuea slightly better. No acute events.  Objective Vital signs in last 24 hours: Vitals:   05/26/20 0719 05/26/20 0749 05/26/20 0800 05/26/20 1104  BP:  (!)  155/80 (!) 156/84   Pulse:  61 (!) 59   Resp:   (!) 21   Temp: 97.6 F (36.4 C)   (!) 97.4 F (36.3 C)  TempSrc: Oral   Oral  SpO2:   91%   Weight:      Height:       Weight change: 0.5 kg  Intake/Output Summary (Last 24 hours) at 05/26/2020 1137 Last data filed at 05/26/2020 0813 Gross per 24 hour  Intake 656.54 ml  Output 2515 ml  Net -1858.46 ml       Labs: Basic Metabolic Panel: Recent Labs  Lab 05/24/20 0500 05/25/20 0300 05/26/20 0430  NA 140 140 138  K 4.1 3.9 3.6  CL 101 102 101  CO2 '22 24 24  ' GLUCOSE 97 86 92  BUN 269* 191* 154*  CREATININE 2.73* 2.21* 2.07*  CALCIUM 9.2 9.2 9.2  PHOS 10.6* 8.3* 7.6*   Liver Function Tests: Recent Labs  Lab 05/24/20 0500 05/25/20 0300 05/26/20 0430  AST '30 29 26  ' ALT '20 19 17  ' ALKPHOS 222* 241* 226*  BILITOT 1.5* 1.3* 1.2  PROT 7.8 7.4 7.7  ALBUMIN 3.4* 3.1* 2.9*   No results for input(s): LIPASE, AMYLASE in the last 168 hours. No results for input(s): AMMONIA in the last 168 hours. CBC: Recent Labs  Lab 05/22/20 0527 05/22/20 1658 05/23/20 0659 05/25/20 0300 05/26/20 0430  WBC 6.2 5.0 4.9 5.6 5.6  HGB 8.0* 7.8* 8.0* 8.0* 8.0*  HCT 25.2* 25.4* 24.9* 25.7* 25.5*  MCV 90.0 91.7 89.6 91.5 91.1  PLT 125* 120* 111* 138* 121*   Cardiac Enzymes: No results for input(s): CKTOTAL, CKMB, CKMBINDEX, TROPONINI in the last 168 hours. CBG: Recent Labs  Lab 05/25/20 1642 05/25/20  1938 05/25/20 2324 05/26/20 0715 05/26/20 1102  GLUCAP 72 79 89 100* 98    Iron Studies: No results for input(s): IRON, TIBC, TRANSFERRIN, FERRITIN in the last 72 hours. Studies/Results: DG CHEST PORT 1 VIEW  Result Date: 05/25/2020 CLINICAL DATA:  Pneumonia EXAM: PORTABLE CHEST 1 VIEW COMPARISON:  May 12, 2020 FINDINGS: Tracheostomy catheter tip is 4.2 cm above the carina. Central catheter tips are in the superior vena cava. No pneumothorax. There are small pleural effusions bilaterally. There is airspace opacity in the right  mid lung region as well as in each lung base region. Heart is enlarged with mild pulmonary venous hypertension. No adenopathy. Postoperative change noted in lower cervical and upper thoracic regions. Apparent leadless pacemaker present inferiorly slightly to the left of midline. IMPRESSION: Cardiomegaly with pulmonary vascular congestion. Small pleural effusions bilaterally. Airspace opacity in the right mid lung and bibasilar regions concerning for areas of pneumonia, although alveolar edema could present in this manner. Both edema and pneumonia may present concurrently. Leadless pacemaker present on the left inferiorly. Tube and catheter positions as described. No evident pneumothorax. Electronically Signed   By: Lowella Grip III M.D.   On: 05/25/2020 15:28    Medications: Infusions: . sodium chloride 10 mL/hr at 05/26/20 0518  . feeding supplement (OSMOLITE 1.5 CAL)    . linezolid (ZYVOX) IV 600 mg (05/26/20 1610)    Scheduled Medications: . carvedilol  6.25 mg Per Tube BID WC  . chlorhexidine gluconate (MEDLINE KIT)  15 mL Mouth Rinse BID  . Chlorhexidine Gluconate Cloth  6 each Topical Daily  . Chlorhexidine Gluconate Cloth  6 each Topical Q0600  . Chlorhexidine Gluconate Cloth  6 each Topical Q0600  . clonazePAM  0.5 mg Per Tube BID  . darbepoetin (ARANESP) injection - DIALYSIS  60 mcg Intravenous Q Mon-HD  . feeding supplement (PROSource TF)  45 mL Per Tube 5 X Daily  . fentaNYL  1 patch Transdermal Q72H   And  . fentaNYL  1 patch Transdermal Q72H  . fluconazole  50 mg Oral Daily  . levothyroxine  125 mcg Per Tube Q0600  . mouth rinse  15 mL Mouth Rinse 10 times per day  . midazolam  2 mg Intravenous Once  . multivitamin with minerals  1 tablet Per Tube Daily  . nutrition supplement (JUVEN)  1 packet Per Tube BID BM  . pantoprazole sodium  40 mg Per Tube BID  . sodium chloride flush  10-40 mL Intracatheter Q12H    have reviewed scheduled and prn medications.  Physical  Exam: General: Elderly male, ill looking sitting up in bed.  On vent via tracheostomy Heart:RRR, s1s2 nl, anasarca present Lungs: Basal decreased breath sound, clear anteriorly Abdomen:soft, Non-tender, distended Extremities: Upper and lower extremities edema/anasarca present Neurology: alert awake Dialysis Access: Right IJ temporary HD catheter site clean.  Vikas Singh 05/26/2020,11:37 AM  LOS: 27 days

## 2020-05-26 NOTE — Progress Notes (Signed)
Called ICU RN to see if pt can come down for procedure. [facility] is not available at this time, RN will call me back before 330pm if there is someone available to travel with pt from [facility].

## 2020-05-26 NOTE — Progress Notes (Signed)
Pt transported to IR and back to 2M 14 on full vent support. No complications noted. 

## 2020-05-26 NOTE — Procedures (Signed)
Interventional Radiology Procedure Note  Procedure: Gastrojejunal tube change under fluoro  Complications: None  Estimated Blood Loss: None  Findings: Current 22 Fr GJ tube malpositioned with loop in stomach and tip in distal stomach/prox duodenum. Jejunal lumen occluded. Guidewire access regained through stomach and to level of proximal jejunum.  New 22 Fr single lumen balloon jejunal feeding tube advanced through stomach and well into proximal jejunum. This larger bore single lumen catheter will be less prone to occlusion.  Jodi Marble. Fredia Sorrow, M.D Pager:  [phone]

## 2020-05-26 NOTE — Progress Notes (Signed)
[facility]  Date of Admission:  04/28/2020      Total days of antibiotics   Linezolid 3/22 >> current  Diflucan 3/15 >> current    ASSESSMENT: Phillip Klein is a 55 y.o. 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 We saw him earlier in his hospitalization for ESBL producing klebsiella pneumonia bacteremia >> treated with 10-day course of meropenem. Thought to be line related, which was removed and replaced.  He became hypothermic 3/19 and re-cultured. Sputum with heavy growth of MRSA. Placed on linezolid PO and called back to evaluate given restricted drug. His SSRI's are appropriately held. He does report increased purulent sputum, overall vent settings appear stable, CXR with possible airspace opacity in RML and B/L LL PNA vs edema. Would treat with 7-day course linezolid given risk for PNA.   Untreated chronic hepatitis C - would consider treatment outpatient.   Continue diflucan for course of mucocutaneous candidiasis.   Please call with further questions or changes in patient's condition.     PLAN: 1. Agree with linezolid - 7-day course of therapy 2. Hold SSRIs until done then can resume 24h after last dose.      Principal Problem:   MRSA Pneumonia Active Problems:   Septic shock ([place])   Pressure injury of skin   Malnutrition of moderate degree   Encounter for feeding tube placement   Hypotension   Quadriplegia (HCC)   Gram-negative bacteremia   Infection due to ESBL-producing [name] pneumoniae   Chronic hepatitis C without hepatic coma (HCC)   Anemia   Jejunostomy tube leak (HCC)   Pleural effusion   Sinus arrest   Pacemaker   HFrEF (heart failure with reduced ejection fraction) (HCC)   Severe malnutrition (HCC)   Anasarca   Acute renal failure superimposed on stage 3b chronic kidney disease  ([place])   . carvedilol  6.25 mg Per Tube BID WC  . chlorhexidine gluconate (MEDLINE KIT)  15 mL Mouth Rinse BID  . Chlorhexidine Gluconate Cloth  6 each Topical Daily  . Chlorhexidine Gluconate Cloth  6 each Topical Q0600  . Chlorhexidine Gluconate Cloth  6 each Topical Q0600  . clonazePAM  0.5 mg Per Tube BID  . darbepoetin (ARANESP) injection - DIALYSIS  60 mcg Intravenous Q Mon-HD  . feeding supplement (PROSource TF)  45 mL Per Tube 5 X Daily  . fentaNYL  1 patch Transdermal Q72H   And  . fentaNYL  1 patch Transdermal Q72H  . fluconazole  50 mg Oral Daily  . levothyroxine  125 mcg Per Tube Q0600  . mouth rinse  15 mL Mouth Rinse 10 times per day  . midazolam  2 mg Intravenous Once  . multivitamin with minerals  1 tablet Per Tube Daily  . nutrition supplement (JUVEN)  1 packet Per Tube BID BM  . pantoprazole sodium  40 mg Per Tube BID  . sodium chloride flush  10-40 mL Intracatheter Q12H    SUBJECTIVE: States his cough has been worse and increase in sputum lately. Feels poorly, tired today.   Hypothermic 3/19. Sputum cultures 3/20 growing abundant MRSA (R-clinda, cipro, erythro).    Review of Systems: Review of Systems  Constitutional: Positive for chills. Negative for fever.  Respiratory: Positive for cough and sputum production. Negative for shortness of breath.   Cardiovascular: Negative for chest pain.  Skin: Negative  for rash.    Allergies  Allergen Reactions  . Acetaminophen Other (See Comments)    "SHUTS DOWN MY ORGANS"  . Gabapentin Other (See Comments)    SUICIDAL IDEATION- Suicidal thoughts and actions     OBJECTIVE: Vitals:   05/26/20 0719 05/26/20 0749 05/26/20 0800 05/26/20 1104  BP:  (!) 155/80 (!) 156/84   Pulse:  61 (!) 59   Resp:   (!) 21   Temp: 97.6 F (36.4 C)   (!) 97.4 F (36.3 C)  TempSrc: Oral   Oral  SpO2:   91%   Weight:      Height:       Body mass index is 30.8 kg/m.  Physical Exam Vitals reviewed.  Constitutional:       Appearance: Normal appearance. He is ill-appearing.     Comments: Sitting upright in bed. Falls asleep easily. Fatigued appearing  HENT:     Mouth/Throat:     Comments: Thrush clear  Neck:     Comments: Trach in place clean and dry. Thick blood tinged secretions noted in inline suctioning Cardiovascular:     Rate and Rhythm: Normal rate and regular rhythm.     Heart sounds: No murmur heard.   Pulmonary:     Breath sounds: Rhonchi present.  Chest:     Comments: R subclavian HD line with dried blood under occlusive dressing.  Musculoskeletal:     Cervical back: Normal range of motion and neck supple.  Neurological:     Mental Status: He is alert.     Lab Results Lab Results  Component Value Date   WBC 5.6 05/26/2020   HGB 8.0 (L) 05/26/2020   HCT 25.5 (L) 05/26/2020   MCV 91.1 05/26/2020   PLT 121 (L) 05/26/2020    Lab Results  Component Value Date   CREATININE 2.07 (H) 05/26/2020   BUN 154 (H) 05/26/2020   NA 138 05/26/2020   K 3.6 05/26/2020   CL 101 05/26/2020   CO2 24 05/26/2020    Lab Results  Component Value Date   ALT 17 05/26/2020   AST 26 05/26/2020   ALKPHOS 226 (H) 05/26/2020   BILITOT 1.2 05/26/2020     Microbiology: BCx 2/24 >> ESBL kleb pna  BCx 3/5 >> no growth  Sputum 3/20 >> abundant MRSA  BCx 3/20 >> no growth prelim     , MSN, NP-C [facility]  [email or web address] Pager: [phone] Office: [phone] [facility] Main Line: [phone]   

## 2020-05-27 DIAGNOSIS — A4159 Other Gram-negative sepsis: Secondary | ICD-10-CM | POA: Diagnosis not present

## 2020-05-27 DIAGNOSIS — J9601 Acute respiratory failure with hypoxia: Secondary | ICD-10-CM | POA: Diagnosis not present

## 2020-05-27 DIAGNOSIS — Z7189 Other specified counseling: Secondary | ICD-10-CM | POA: Diagnosis not present

## 2020-05-27 DIAGNOSIS — N170 Acute kidney failure with tubular necrosis: Secondary | ICD-10-CM | POA: Diagnosis not present

## 2020-05-27 DIAGNOSIS — A4902 Methicillin resistant Staphylococcus aureus infection, unspecified site: Secondary | ICD-10-CM | POA: Diagnosis not present

## 2020-05-27 DIAGNOSIS — I959 Hypotension, unspecified: Secondary | ICD-10-CM | POA: Diagnosis not present

## 2020-05-27 DIAGNOSIS — N1832 Chronic kidney disease, stage 3b: Secondary | ICD-10-CM | POA: Diagnosis not present

## 2020-05-27 LAB — COMPREHENSIVE METABOLIC PANEL
ALT: 14 U/L (ref 0–44)
AST: 19 U/L (ref 15–41)
Albumin: 2.6 g/dL — ABNORMAL LOW (ref 3.5–5.0)
Alkaline Phosphatase: 214 U/L — ABNORMAL HIGH (ref 38–126)
Anion gap: 14 (ref 5–15)
BUN: 157 mg/dL — ABNORMAL HIGH (ref 6–20)
CO2: 22 mmol/L (ref 22–32)
Calcium: 9.1 mg/dL (ref 8.9–10.3)
Chloride: 102 mmol/L (ref 98–111)
Creatinine, Ser: 2.4 mg/dL — ABNORMAL HIGH (ref 0.61–1.24)
GFR, Estimated: 32 mL/min — ABNORMAL LOW (ref >60)
Glucose, Bld: 84 mg/dL (ref 70–99)
Potassium: 3.5 mmol/L (ref 3.5–5.1)
Sodium: 138 mmol/L (ref 135–145)
Total Bilirubin: 1 mg/dL (ref 0.3–1.2)
Total Protein: 7.2 g/dL (ref 6.5–8.1)

## 2020-05-27 LAB — PHOSPHORUS: Phosphorus: 8.1 mg/dL — ABNORMAL HIGH (ref 2.5–4.6)

## 2020-05-27 LAB — CBC
HCT: 24.6 % — ABNORMAL LOW (ref 39.0–52.0)
Hemoglobin: 7.6 g/dL — ABNORMAL LOW (ref 13.0–17.0)
MCH: 28.9 pg (ref 26.0–34.0)
MCHC: 30.9 g/dL (ref 30.0–36.0)
MCV: 93.5 fL (ref 80.0–100.0)
Platelets: 139 10*3/uL — ABNORMAL LOW (ref 150–400)
RBC: 2.63 MIL/uL — ABNORMAL LOW (ref 4.22–5.81)
RDW: 15.8 % — ABNORMAL HIGH (ref 11.5–15.5)
WBC: 7.6 10*3/uL (ref 4.0–10.5)
nRBC: 0 % (ref 0.0–0.2)

## 2020-05-27 LAB — GLUCOSE, CAPILLARY
Glucose-Capillary: 81 mg/dL (ref 70–99)
Glucose-Capillary: 94 mg/dL (ref 70–99)
Glucose-Capillary: 94 mg/dL (ref 70–99)
Glucose-Capillary: 107 mg/dL — ABNORMAL HIGH (ref 70–99)
Glucose-Capillary: 102 mg/dL — ABNORMAL HIGH (ref 70–99)

## 2020-05-27 LAB — HEMOGLOBIN AND HEMATOCRIT, BLOOD
HCT: 30.9 % — ABNORMAL LOW (ref 39.0–52.0)
Hemoglobin: 9.6 g/dL — ABNORMAL LOW (ref 13.0–17.0)

## 2020-05-27 MED ORDER — LINEZOLID 600 MG PO TABS
600.0000 mg | ORAL_TABLET | Freq: Two times a day (BID) | ORAL | Status: AC
  Administered 2020-05-27 – 2020-06-01 (×10): 600 mg
  Filled 2020-05-27 (×10): qty 1

## 2020-05-27 MED ORDER — HEPARIN SODIUM (PORCINE) 1000 UNIT/ML IJ SOLN
INTRAMUSCULAR | Status: AC
  Administered 2020-05-27: 1000 [IU] via INTRAVENOUS_CENTRAL
  Filled 2020-05-27: qty 4

## 2020-05-27 MED ORDER — LINEZOLID 100 MG/5ML PO SUSR: 600.0000 mg | Freq: Two times a day (BID) | ORAL | Status: DC

## 2020-05-27 NOTE — Progress Notes (Signed)
Nutrition Follow-up  DOCUMENTATION CODES:   Non-severe (moderate) malnutrition in context of chronic illness  INTERVENTION:   Resume TF via J tube:   Osmolite 1.5 at 65 ml/h (1560 ml per day) Prosource TF 45 ml 5 times per day  Provides 2540 kcal, 160 gm protein, 1192 ml free water daily  Ensure adequate water flushes to prevent tube from clogging: 20-30 ml every 4 hours, before and after med administration and when continuous feeding is interrupted.  Continue Juven BID, each packet provides 80 calories, 8 grams of carbohydrate, 2.5  grams of protein (collagen), 7 grams of L-arginine and 7 grams of L-glutamine; supplement contains CaHMB, Vitamins C, E, B12 and Zinc to promote wound healing.  NUTRITION DIAGNOSIS:   Moderate Malnutrition related to chronic illness (CHF, cirrhosis) as evidenced by moderate fat depletion,severe muscle depletion.  Ongoing  GOAL:   Patient will meet greater than or equal to 90% of their needs  Met   MONITOR:   Vent status,Labs,Weight trends,TF tolerance,Skin,I & O's  REASON FOR ASSESSMENT:   Ventilator,Consult Enteral/tube feeding initiation and management  ASSESSMENT:   55 year old male who presented to the ED on 2/24 from [facility] with hypotension, fever. Pt was scheduled to have revision of J-tube on 2/24 and was found to be hypotensive. PMH of vent dependence via trach, quadriplegia after cervical spine surgery in summer of 2021, s/p ex-lap for G-tube malfunction, multiple pressure injuries, GERD, T2DM, anemia, CHF, previous IVDA, cirrhosis. Pt admitted with septic shock.  TF on hold since 3/19 d/t J-tube being clogged. IR exchanged J-tube 3/23. Received MD Consult for TF initiation and management.  Plans for HD today.  Patient remains on chronic ventilatory support via trach.  MV: 10 L/min Temp (24hrs), Avg:97.6 F (36.4 C), Min:97.4 F (36.3 C), Max:97.8 F (36.6 C)   Labs reviewed. Phos 8.1, BUN 157, creatinine 2.6 CBG:  81-94  Medications reviewed and include Aranesp, MVI with minerals, Juven.  Admit weight: 107.5 kg  Current weight: 107.4 kg    UOP: 1080 mL x 24 hrs Stool output: 100 mL x 24 hours (colostomy) I&O's: -72.6 ml since admit  Diet Order:   Diet Order            Diet NPO time specified  Diet effective now                 EDUCATION NEEDS:   No education needs have been identified at this time  Skin:  Skin Assessment: Skin Integrity Issues: Skin Integrity Issues:: Incisions DTI: R leg Stage I: R leg Stage III: L elbow Stage IV: vertebral column, sacrum, bilateral ischial tuberosity, R hip, R lower leg Unstageable: L leg x 2, R leg x 3, R elbow, L arm Incisions: closed abdomen Other: skin tear right arm  Last BM:  3/24 (colostomy)  Height:   Ht Readings from Last 1 Encounters:  05/10/20 '6\' 2"'  (1.88 m)    Weight:   Wt Readings from Last 1 Encounters:  05/27/20 107.4 kg    Ideal Body Weight:  86.4 kg  BMI:  Body mass index is 30.4 kg/m.  Estimated Nutritional Needs:   Kcal:  2400-2600  Protein:  150-170 grams  Fluid:  >/= 2.0 L   Lucas Mallow, RD, LDN, CNSC Please refer to Amion for contact information.

## 2020-05-27 NOTE — Progress Notes (Signed)
PROGRESS NOTE    Phillip Klein  JQB:[id number] DOB: 1965-09-25 DOA: 04/28/2020 PCP: [name], MD    Brief Narrative: This 55 years old male with PMH significant for chronic hypoxic respiratory failure with tracheostomy and vent since 2021 after C-spine surgery, GERD, DM, anemia of chronic disease, quadriplegia, malnutrition, neurogenic orthostatic hypotension, untreated HCV, previous IV drug use, cirrhosis, chronic pain syndrome was sent from [facility] with septic shock. Patient admitted in the ICU with septic shock secondary to ESBL/ [name] bacteremia. Patient had Hickman's catheter which was removed, presumed to be the source of infection. Patient has completed meropenem for 10 days after removal of the catheter. Patient has developed acute kidney injury secondary to ischemic ATN from pressors requiring septic shock.,Cardiology,Nephrology is following. PCCM is assisting with vent management. Patient had multiple chronic bedsores,wound care is following. Palliative care consulted to discuss goals of care.  Patient eventually started on hemodialysis.  Patient is not a candidate for long-term outpatient dialysis because of poor functional status and multiple comorbidities.  Palliative care consulted and patient and family wants full scope of treatment.  Assessment & Plan:   Principal Problem:   MRSA Pneumonia Active Problems:   Septic shock ([place])   Pressure injury of skin   Malnutrition of moderate degree   Encounter for feeding tube placement   Hypotension   Quadriplegia (HCC)   Gram-negative bacteremia   Infection due to ESBL-producing [name] pneumoniae   Chronic hepatitis C without hepatic coma (HCC)   Anemia   Jejunostomy tube leak (HCC)   Pleural effusion   Sinus arrest   Pacemaker   HFrEF (heart failure with reduced ejection fraction) (HCC)   Severe malnutrition (HCC)   Anasarca   Acute renal failure superimposed on stage 3b chronic kidney  disease (HCC)   Septic shock, POA secondary to [name] bacteremia, UTI versus line infection  -Hickman catheter presumed to be the source of infection, removed. -Blood cultures + [name] bacteremia, UA positive, culture showed multiple species -Completed meropenem for 10 days on 05/12/2020, repeat blood cultures negative -PICC line inserted on 3/8, venous Doppler showed no DVT. -Sepsis physiology resolved.   Active problems Acute kidney injury secondary to ischemic ATN, septic shock requiring pressors, chronic hypotension, anasarca -Patient was placed on albumin and Lasix for anasarca, per nephrology has not been effective. -Nephrology following, not a candidate for long-term dialysis,  however may be an option for management of severe azotemia. -Palliative medicine also following.  Patient and family wants to have a full scope of treatment. -05/22/20: Hemodialysis is planned -05/23/2020: Patient underwent hemodialysis 3/20, 3/21 and 3/22 with 2 L UF.  Next dialysis 3/24. - Bun has slightly improved.  Acute blood loss anemia with intermittent bleeding from wounds : -Platelet dysfunction in the setting of severe azotemia. -Nephrology recommended the conjugated estrogen (Premarin) via tube for 5 days, completed 3/20. -Hemoglobin again down to 6.7, received 2 units PRBC -05/26/19: Hemoglobin is 8 g/dL > 9.6 g/dl   Essential hypertension Continue Coreg, hydralazine IV as needed with parameters.  Acute on chronic combined systolic and diastolic CHF exacerbation with anasarca: - Other contributers include severe protein calorie malnutrition with hypoalbuminemia and third spacing -2D echo showed EF of 40 to 45% with global hypokinesis -Nephrology following, treated with albumin/furosemide, fluid restriction, considered short-term hemodialysis -Patient had hemodialysis on 3/20, 3/21 and 3/22.  Next dialysis 3/24.  Chronic ventilator dependent respiratory failure,  quadriplegia -Management per CCM, continue ventilatory support with trach. -Continue feeding through tube feed when possible.  The tubes were clogged.   -Interventional radiology team has been consulted to replace G tube. -Patient underwent tracheostomy tube change 3/21. -GJ tube changed 3/23.   Hypotension, likely orthostatic hypotension Florinef was discontinued in ICU due to concern for fluid retention, midodrine has been stopped. BP stable.  Chronic pain syndrome -Currently stable, continue fentanyl patch, oxycodone  Depression, insomnia Continue sertraline, trazodone, clonazepam  GERD Continue PPI  Left arm swelling -Venous Dopplers negative for DVT  Hematuria -resolving, urology was consulted, no need for any acute intervention.  Hypothermia: > Resolved. -Patient was pancultured, cultures no growth so far. -Hold on antibiotics. He just completed meropenum x 10 days. -Continue warming blanket. -ID consulted, MRSA +, chest x-ray consistent with pneumonia -ID recommended Linezolid x 7 days.  Multiple pressure wounds, present on admission -Full-thickness vertebral column stage IV -Sacrum stage IV, -Right and left ischial tuberosity, stage IV -Right lateral hip, stage IV -Right and left lower leg lateral, unstageable -Right (unstageable now) and left posterior elbow, stage III - left posterior lower arm, unstageable   05/23/2020: Patient has been refusing wound care/dressing.  Obesity Estimated body mass index is 30.43 kg/m as calculated from the following:   Height as of this encounter: _0  (1.88 m).   Weight as of this encounter: 107.5 kg.   DVT prophylaxis:  SCDs Code Status: Full code. Family Communication:  No family at bed side. Disposition Plan:  Status is: Inpatient  Remains inpatient appropriate because:Inpatient level of care appropriate due to severity of illness   Dispo: The patient is from: SNF              Anticipated d/c is to:  [facility]              Patient currently is not medically stable to d/c.   Difficult to place patient Yes   Transfer back to [facility] or [facility] pending bed availability and cleared from nephrology.   Consultants:   PCCM  Cardiology  Nephrology  IR  Palliative care  Procedures:  Antimicrobials: Anti-infectives (From admission, onward)   Start     Dose/Rate Route Frequency Ordered Stop   05/27/20 2000  linezolid (ZYVOX) 100 MG/5ML suspension 600 mg        600 mg Per Tube Every 12 hours 05/27/20 0838 06/01/20 2159   05/25/20 1845  linezolid (ZYVOX) IVPB 600 mg  Status:  Discontinued        600 mg 300 mL/hr over 60 Minutes Intravenous Every 12 hours 05/25/20 1748 05/27/20 0838   05/19/20 1000  fluconazole (DIFLUCAN) tablet 50 mg       "Followed by" Linked Group Details   50 mg Oral Daily 05/18/20 1048 05/27/20 0929   05/18/20 1145  fluconazole (DIFLUCAN) tablet 100 mg       "Followed by" Linked Group Details   100 mg Oral  Once 05/18/20 1048 05/18/20 1145   05/11/20 0800  meropenem (MERREM) 1 g in sodium chloride 0.9 % 100 mL IVPB        1 g 200 mL/hr over 30 Minutes Intravenous Every 8 hours 05/11/20 0707 05/12/20 1555   05/11/20 0715  meropenem (MERREM) 1 g in sodium chloride 0.9 % 100 mL IVPB  Status:  Discontinued        1 g 200 mL/hr over 30 Minutes Intravenous Every 8 hours 05/11/20 0706 05/11/20 0707   05/03/20 2200  meropenem (MERREM) 1 g in sodium chloride 0.9 % 100 mL IVPB  Status:  Discontinued  1 g 200 mL/hr over 30 Minutes Intravenous Every 12 hours 05/03/20 1041 05/11/20 0706   04/30/20 1145  meropenem (MERREM) 2 g in sodium chloride 0.9 % 100 mL IVPB  Status:  Discontinued        2 g 200 mL/hr over 30 Minutes Intravenous Every 12 hours 04/30/20 1052 05/03/20 1041   04/30/20 0530  ceFEPIme (MAXIPIME) 2 g in sodium chloride 0.9 % 100 mL IVPB  Status:  Discontinued        2 g 200 mL/hr over 30 Minutes Intravenous Every 12 hours 04/28/2020 1852 04/30/20 1051    04/25/2020 1852  vancomycin variable dose per unstable renal function (pharmacist dosing)  Status:  Discontinued         Does not apply See admin instructions 04/06/2020 1852 04/30/20 1051   04/23/2020 1630  vancomycin (VANCOREADY) IVPB 2000 mg/400 mL        2,000 mg 200 mL/hr over 120 Minutes Intravenous  Once 04/15/2020 1621 04/28/2020 2008   04/21/2020 1630  ceFEPIme (MAXIPIME) 2 g in sodium chloride 0.9 % 100 mL IVPB        2 g 200 mL/hr over 30 Minutes Intravenous  Once 04/18/2020 1621 04/16/2020 1749     Subjective: Patient was seen and examined at bedside.  Overnight events noted.   Patient still appears pretty swollen with scrotal edema and generalized anasarca.   He reports feeling better, renal functions improving. He communicates by mouthing words.  Objective: Vitals:   05/27/20 0744 05/27/20 0800 05/27/20 0804 05/27/20 0900  BP:  (!) 168/81 (!) 170/86 (!) 150/83  Pulse:  66 67 79  Resp:  18    Temp: (!) 97.4 F (36.3 C)     TempSrc: Oral     SpO2:  99%    Weight:      Height:        Intake/Output Summary (Last 24 hours) at 05/27/2020 1105 Last data filed at 05/27/2020 1000 Gross per 24 hour  Intake 789.92 ml  Output 1570 ml  Net -780.08 ml   Filed Weights   05/25/20 0335 05/26/20 0500 05/27/20 0500  Weight: 108.3 kg 108.8 kg 107.4 kg    Examination:  General exam: Appears calm and comfortable, not in any distress.  HEENT: Tracheostomy tube connected with ventilator, vent dependent. Respiratory system: Clear to auscultation. Respiratory effort normal. Cardiovascular system: S1 & S2 heard, RRR. No JVD, murmurs, rubs, gallops or clicks. +++ pedal edema. Gastrointestinal system: Abdomen is nondistended, soft and nontender. No organomegaly or masses felt.  Normal bowel sounds heard. Colostomy bag on left. Central nervous system: Alert and oriented x 2. No focal neurological deficits. Extremities: Bilateral heal protectors, Pitting edema, Anasarca. Skin: Multiple pressure ulcers  in multiple locations Psychiatry: Judgement and insight appear normal. Mood & affect appropriate.     Data Reviewed: I have personally reviewed following labs and imaging studies  CBC: Recent Labs  Lab 05/22/20 0527 05/22/20 1658 05/23/20 0659 05/25/20 0300 05/26/20 0430 05/27/20 0630  WBC 6.2 5.0 4.9 5.6 5.6  --   HGB 8.0* 7.8* 8.0* 8.0* 8.0* 9.6*  HCT 25.2* 25.4* 24.9* 25.7* 25.5* 30.9*  MCV 90.0 91.7 89.6 91.5 91.1  --   PLT 125* 120* 111* 138* 121*  --    Basic Metabolic Panel: Recent Labs  Lab 05/23/20 0659 05/24/20 0500 05/25/20 0300 05/26/20 0430 05/27/20 0630  NA 139 140 140 138 138  K 4.2 4.1 3.9 3.6 3.5  CL 102 101 102 101 102  CO2 _0 GLUCOSE 118* 97 86 92 84  BUN 271* 269* 191* 154* 157*  CREATININE 2.66* 2.73* 2.21* 2.07* 2.40*  CALCIUM 9.3 9.2 9.2 9.2 9.1  MG  --   --  2.0 1.9  --   PHOS 10.2* 10.6* 8.3* 7.6* 8.1*   GFR: Estimated Creatinine Clearance: 47 mL/min (A) (by C-G formula based on SCr of 2.4 mg/dL (H)). Liver Function Tests: Recent Labs  Lab 05/23/20 0659 05/24/20 0500 05/25/20 0300 05/26/20 0430 05/27/20 0630  AST  --  _1 ALT  --  _2 ALKPHOS  --  222* 241* 226* 214*  BILITOT  --  1.5* 1.3* 1.2 1.0  PROT  --  7.8 7.4 7.7 7.2  ALBUMIN 2.9* 3.4* 3.1* 2.9* 2.6*   No results for input(s): LIPASE, AMYLASE in the last 168 hours. No results for input(s): AMMONIA in the last 168 hours. Coagulation Profile: No results for input(s): INR, PROTIME in the last 168 hours. Cardiac Enzymes: No results for input(s): CKTOTAL, CKMB, CKMBINDEX, TROPONINI in the last 168 hours. BNP (last 3 results) No results for input(s): PROBNP in the last 8760 hours. HbA1C: No results for input(s): HGBA1C in the last 72 hours. CBG: Recent Labs  Lab 05/26/20 1102 05/26/20 1707 05/26/20 2327 05/27/20 0606 05/27/20 0733  GLUCAP 98 86 91 81 94   Lipid Profile: No results for input(s): CHOL, HDL, LDLCALC, TRIG, CHOLHDL,  LDLDIRECT in the last 72 hours. Thyroid Function Tests: No results for input(s): TSH, T4TOTAL, FREET4, T3FREE, THYROIDAB in the last 72 hours. Anemia Panel: No results for input(s): VITAMINB12, FOLATE, FERRITIN, TIBC, IRON, RETICCTPCT in the last 72 hours. Sepsis Labs: No results for input(s): PROCALCITON, LATICACIDVEN in the last 168 hours.  Recent Results (from the past 240 hour(s))  Culture, Urine     Status: Abnormal   Collection Time: 05/23/20  9:31 AM   Specimen: Urine, Random  Result Value Ref Range Status   Specimen Description URINE, RANDOM  Final   Special Requests   Final    NONE Performed at [facility], [address] [place], [place] [address]    Culture MULTIPLE SPECIES PRESENT, SUGGEST RECOLLECTION (A)  Final   Report Status 05/24/2020 FINAL  Final  Culture, blood (routine x 2)     Status: None (Preliminary result)   Collection Time: 05/23/20 10:02 AM   Specimen: BLOOD LEFT HAND  Result Value Ref Range Status   Specimen Description BLOOD LEFT HAND  Final   Special Requests   Final    BOTTLES DRAWN AEROBIC ONLY Blood Culture results may not be optimal due to an inadequate volume of blood received in culture bottles   Culture   Final    NO GROWTH 4 DAYS Performed at [facility], [place] [address], [place], [place] [address]    Report Status PENDING  Incomplete  Culture, blood (routine x 2)     Status: None (Preliminary result)   Collection Time: 05/23/20 10:17 AM   Specimen: BLOOD LEFT HAND  Result Value Ref Range Status   Specimen Description BLOOD LEFT HAND  Final   Special Requests   Final    BOTTLES DRAWN AEROBIC ONLY Blood Culture results may not be optimal due to an inadequate volume of blood received in culture bottles   Culture   Final    NO GROWTH 4 DAYS Performed at [facility], [place] [address]  [place], [place] [address]    Report Status PENDING  Incomplete  Culture, Respiratory w Gram Stain     Status: None   Collection Time:  05/23/20  5:12 PM   Specimen: Tracheal Aspirate; Respiratory  Result Value Ref Range Status   Specimen Description TRACHEAL ASPIRATE  Final   Special Requests NONE  Final   Gram Stain   Final    MODERATE WBC PRESENT, PREDOMINANTLY PMN ABUNDANT GRAM POSITIVE COCCI IN PAIRS IN CHAINS Performed at [facility], [place] [address], [place], [place] [address]    Culture   Final    MODERATE METHICILLIN RESISTANT STAPHYLOCOCCUS AUREUS   Report Status 05/25/2020 FINAL  Final   Organism ID, Bacteria METHICILLIN RESISTANT STAPHYLOCOCCUS AUREUS  Final      Susceptibility   Methicillin resistant staphylococcus aureus - MIC*    CIPROFLOXACIN >=8 RESISTANT Resistant     ERYTHROMYCIN >=8 RESISTANT Resistant     GENTAMICIN <=0.5 SENSITIVE Sensitive     OXACILLIN >=4 RESISTANT Resistant     TETRACYCLINE <=1 SENSITIVE Sensitive     VANCOMYCIN <=0.5 SENSITIVE Sensitive     TRIMETH/SULFA <=10 SENSITIVE Sensitive     CLINDAMYCIN >=8 RESISTANT Resistant     RIFAMPIN <=0.5 SENSITIVE Sensitive     Inducible Clindamycin NEGATIVE Sensitive     * MODERATE METHICILLIN RESISTANT STAPHYLOCOCCUS AUREUS    Radiology Studies: IR GJ Tube Change  Result Date: 05/26/2020 INDICATION: Indwelling 22 French gastrojejunal feeding tube with occlusion of the jejunal lumen. EXAM: GASTROJEJUNAL TUBE EXCHANGE UNDER FLUOROSCOPY MEDICATIONS: None ANESTHESIA/SEDATION: None CONTRAST:  20 mL Omnipaque 300 FLUOROSCOPY TIME:  Fluoroscopy Time: 4 minutes and 12 seconds. 91.7 mGy. COMPLICATIONS: None immediate. PROCEDURE: Informed written consent was obtained from the patient's wife after a thorough discussion of the procedural risks, benefits and alternatives. All questions were addressed. Maximal Sterile Barrier Technique was utilized including caps, mask, sterile gowns, sterile gloves, sterile drape, hand hygiene and skin antiseptic. A timeout was performed prior to the initiation of the procedure. The pre-existing gastrojejunal  feeding tube was prepped with chlorhexidine. Fluoroscopy was performed to confirm tube position. The retention balloon was deflated. The tube was retracted under fluoroscopy. The tube was then removed via the gastric lumen over a guidewire. A 5 French catheter was then utilized in obtaining guidewire access through the stomach, duodenum and to the level of the proximal jejunum. A new 66 French balloon retention single-lumen jejunal feeding tube was then advanced over the wire. Tube position was confirmed by fluoroscopy with injection of contrast material. The retention balloon was inflated with 10 mL of saline. FINDINGS: The pre-existing tube is malpositioned with a redundant loop in the stomach such that the tip of the catheter is in the region of the distal antrum of the stomach and potentially just extending into the proximal duodenum. The jejunal lumen was also occluded. A new 42 French single-lumen catheter was advanced over wire through the distal stomach, duodenum and proximal jejunum with the tip located well into the proximal jejunum. This tube flushes normally and is ready for immediate use. IMPRESSION: Replacement of malpositioned and occluded gastrojejunal feeding tube under fluoroscopy. The pre-existing tube had retracted into the stomach with the tip located in the distal stomach/proximal duodenum. A new single-lumen 63 French balloon retention catheter was placed and advanced such that the tip lies in the proximal jejunum. Electronically Signed   By: Aletta Edouard M.D.   On: 05/26/2020 16:51   DG CHEST PORT 1 VIEW  Result Date:  05/25/2020 CLINICAL DATA:  Pneumonia EXAM: PORTABLE CHEST 1 VIEW COMPARISON:  May 12, 2020 FINDINGS: Tracheostomy catheter tip is 4.2 cm above the carina. Central catheter tips are in the superior vena cava. No pneumothorax. There are small pleural effusions bilaterally. There is airspace opacity in the right mid lung region as well as in each lung base region. Heart is  enlarged with mild pulmonary venous hypertension. No adenopathy. Postoperative change noted in lower cervical and upper thoracic regions. Apparent leadless pacemaker present inferiorly slightly to the left of midline. IMPRESSION: Cardiomegaly with pulmonary vascular congestion. Small pleural effusions bilaterally. Airspace opacity in the right mid lung and bibasilar regions concerning for areas of pneumonia, although alveolar edema could present in this manner. Both edema and pneumonia may present concurrently. Leadless pacemaker present on the left inferiorly. Tube and catheter positions as described. No evident pneumothorax. Electronically Signed   By: Lowella Grip III M.D.   On: 05/25/2020 15:28   Scheduled Meds: . carvedilol  6.25 mg Per Tube BID WC  . chlorhexidine gluconate (MEDLINE KIT)  15 mL Mouth Rinse BID  . Chlorhexidine Gluconate Cloth  6 each Topical Daily  . Chlorhexidine Gluconate Cloth  6 each Topical Q0600  . Chlorhexidine Gluconate Cloth  6 each Topical Q0600  . clonazePAM  0.5 mg Per Tube BID  . darbepoetin (ARANESP) injection - DIALYSIS  60 mcg Intravenous Q Mon-HD  . feeding supplement (PROSource TF)  45 mL Per Tube 5 X Daily  . fentaNYL  1 patch Transdermal Q72H   And  . fentaNYL  1 patch Transdermal Q72H  . levothyroxine  125 mcg Per Tube Q0600  . linezolid  600 mg Per Tube Q12H  . mouth rinse  15 mL Mouth Rinse 10 times per day  . midazolam  2 mg Intravenous Once  . multivitamin with minerals  1 tablet Per Tube Daily  . nutrition supplement (JUVEN)  1 packet Per Tube BID BM  . pantoprazole sodium  40 mg Per Tube BID  . sodium chloride flush  10-40 mL Intracatheter Q12H   Continuous Infusions: . sodium chloride 10 mL/hr at 05/27/20 1000  . feeding supplement (OSMOLITE 1.5 CAL)       LOS: 28 days    Time spent: 35 mins    Ansel Ferrall, MD [facility]   If 7PM-7AM, please contact night-coverage

## 2020-05-27 NOTE — Progress Notes (Signed)
NAME:  Phillip Klein, MRN:  [id number], DOB:  [birth year], LOS: 28 ADMISSION DATE:  04/11/2020, CONSULTATION DATE:  2/27 REFERRING MD:  Silverio Lay, CHIEF COMPLAINT:  Sepsis   Brief History:  55 yo male who presented from [facility]. Admitted 2/24 with septic shock, ESBL [name]. Hickman catheter removed with concern for source of infection. Oliguric AKI , with baseline creatinine 1.8.   Past Medical History:  Chronic hypoxic respiratory failure/ tstomy/vent since 2021 after c spine surgery Hx of MRSA GERD Type 2 diabetes Anemia of chronic illness Osteomyelitis of cervical spine  Quadriplegia Spinal epidural abscess C2-C7 and T9-L4 Malnutrition Neurogenic astrostatic hypotension  HFrEF Untreated HCV Previous IVDU Cirrhosis  Chronic pain  Significant Hospital Events:  2/24 Admit with shock in the setting of klebsiella bacteremia  3/02 albumin 3/03 lasix 3/05 trial of SIMV/PS 3/14 Full support PEEP 10, 40% HGB 6.9 (TRH Mgmt) > diuresed / no transfusion (hemodilution) 3/17 On vent, PEEP 8 / 40%  Consults:  CCM ID Renal 3/2  Procedures:  Trach PTA >>  Hickman catheter PTA >> removed 2/26  Significant Diagnostic Tests:    Micro Data:  Blood 2/24  >> [name] ESBL 1/4 bottle Urine  2/26 >> multiple species   Antimicrobials:  Cefepime 2/24 >> 2/25 Vanc 2/24  >> 2/25  Mero 2/25  >>  3/9  Interim History / Subjective:  Placed on SBT with pressure support but could not tolerate due to respiratory distress, put back on full vent support  Objective   Blood pressure (!) 150/83, pulse 79, temperature (!) 97.4 F (36.3 C), temperature source Oral, resp. rate 18, height 6\' 2"  (1.88 m), weight 107.4 kg, SpO2 99 %.    Vent Mode: PRVC FiO2 (%):  [40 %] 40 % Set Rate:  [18 bmp] 18 bmp Vt Set:  [570 mL] 570 mL PEEP:  [5 cmH20] 5 cmH20 Plateau Pressure:  [19 cmH20-34 cmH20] 20 cmH20   Intake/Output Summary (Last 24 hours) at 05/27/2020 1017 Last data filed at  05/27/2020 1000 Gross per 24 hour  Intake 799.92 ml  Output 1570 ml  Net -770.08 ml   Filed Weights   05/25/20 0335 05/26/20 0500 05/27/20 0500  Weight: 108.3 kg 108.8 kg 107.4 kg    Examination: General: Chronically ill appearing male, sitting in the bed, status post trach HEENT: MM pink/moist, #7 distal XLT trach midline c/d/i Neuro: Awake, alert, communicates by mouthing words CV: s1s2 RRR, NSR, no m/r/g PULM: non-labored on vent, lungs bilaterally clear GI: soft, bsx4 active, abd dressings intact / PEG c/d/i Extremities: warm/dry, anasarca   Skin: no rashes or lesions  Resolved Hospital Problem list   Septic shock  Assessment & Plan:   Chronic Hypoxemic Respiratory Failure in the Setting of Quadriplegia status post tracheostomy / Vent Dependent Chronic respiratory failure unlikely unable to be weaned from mechanical ventilation. PRVC Vt 570, Rate 20, PEEP 5, FiO2 40% Patient could not tolerate spontaneous breathing trial due to respiratory distress Routine trach care per protocol  All Other Issues Below per primary team [name] bacteremia from UTI vs line infection Hickman catheter removed  Pacemaker in Place AKI likely ATN in setting of septic shock, on hemodialysis Anasarca Jejunostomy tube drainage> improved Hypotension likely related to dysautonomia - off midodrine Chronic pain Depression, insomnia Anemia of chronic disease + ABLA with Oozing from Wounds / Uremic Platelet Dysfunction   [facility] primary - PCCM will continue to f/u 1-2x/week while in hospital   Best practice (evaluated daily)  Diet: tube  feeding Pain/Anxiety/Delirium protocol (if indicated): Fentanyl patch VAP protocol (if indicated): yes DVT prophylaxis: sub q heparin GI prophylaxis: n/a Glucose control: SSI Mobility: bed rest Disposition: Per TRH, to vent SNF when bed available  Goals of Care:  Per primary team  Labs   CBC: Recent Labs  Lab 05/22/20 0527 05/22/20 1658  05/23/20 0659 05/25/20 0300 05/26/20 0430 05/27/20 0630  WBC 6.2 5.0 4.9 5.6 5.6  --   HGB 8.0* 7.8* 8.0* 8.0* 8.0* 9.6*  HCT 25.2* 25.4* 24.9* 25.7* 25.5* 30.9*  MCV 90.0 91.7 89.6 91.5 91.1  --   PLT 125* 120* 111* 138* 121*  --     Basic Metabolic Panel: Recent Labs  Lab 05/23/20 0659 05/24/20 0500 05/25/20 0300 05/26/20 0430 05/27/20 0630  NA 139 140 140 138 138  K 4.2 4.1 3.9 3.6 3.5  CL 102 101 102 101 102  CO2 22 22 24 24 22   GLUCOSE 118* 97 86 92 84  BUN 271* 269* 191* 154* 157*  CREATININE 2.66* 2.73* 2.21* 2.07* 2.40*  CALCIUM 9.3 9.2 9.2 9.2 9.1  MG  --   --  2.0 1.9  --   PHOS 10.2* 10.6* 8.3* 7.6* 8.1*   GFR: Estimated Creatinine Clearance: 47 mL/min (A) (by C-G formula based on SCr of 2.4 mg/dL (H)). Recent Labs  Lab 05/22/20 1658 05/23/20 0659 05/25/20 0300 05/26/20 0430  WBC 5.0 4.9 5.6 5.6    Liver Function Tests: Recent Labs  Lab 05/23/20 0659 05/24/20 0500 05/25/20 0300 05/26/20 0430 05/27/20 0630  AST  --  30 29 26 19   ALT  --  20 19 17 14   ALKPHOS  --  222* 241* 226* 214*  BILITOT  --  1.5* 1.3* 1.2 1.0  PROT  --  7.8 7.4 7.7 7.2  ALBUMIN 2.9* 3.4* 3.1* 2.9* 2.6*   No results for input(s): LIPASE, AMYLASE in the last 168 hours. No results for input(s): AMMONIA in the last 168 hours.  ABG    Component Value Date/Time   PHART 7.493 (H) 05/11/2020 2048   PCO2ART 32.3 05/11/2020 2048   PO2ART 100 05/11/2020 2048   HCO3 24.9 05/11/2020 2048   TCO2 26 05/11/2020 2048   ACIDBASEDEF 7.0 (H) 04/23/2020 1639   O2SAT 98.0 05/11/2020 2048     Coagulation Profile: No results for input(s): INR, PROTIME in the last 168 hours.  Cardiac Enzymes: No results for input(s): CKTOTAL, CKMB, CKMBINDEX, TROPONINI in the last 168 hours.  HbA1C: Hgb A1c MFr Bld  Date/Time Value Ref Range Status  05/02/2020 10:00 PM 5.1 4.8 - 5.6 % Final    Comment:    (NOTE) Pre diabetes:          5.7%-6.4%  Diabetes:              >6.4%  Glycemic  control for   <7.0% adults with diabetes     CBG: Recent Labs  Lab 05/26/20 1102 05/26/20 1707 05/26/20 2327 05/27/20 0606 05/27/20 0733  GLUCAP 98 86 91 81 94      05/28/20 Jj Enyeart MD [facility] See Amion for pager If no response to pager, please call [phone] until 7pm After 7pm, Please call E-link [phone]

## 2020-05-27 NOTE — Progress Notes (Signed)
Daily Progress Note   Patient Name: Phillip Klein       Date: 05/27/2020 DOB: 1966/01/30  Age: 55 y.o. MRN#: [id number] Attending Physician: Shawna Clamp, MD Primary Care Physician: [name], MD Admit Date: 04/25/2020  Reason for Consultation/Follow-up: Establishing goals of care  Subjective: Pt undergoing dialysis on my visit. Complains of "needs oxygen". Discussed patient's concerns with nursing- he was on pressure support this morning, but they are having to increase his O2 throughout the day. His saturation on my eval was 94%.  Discussed with nursing how patient has history of feeling poorly oxygenated despite O2 sats. Agree that there is also an element of anxiety present that can contribute to this.  Phillip Klein for followup. She inquired about feeding him orally. Discussed that was not appropriate given his trach and ventilated status. We discussed if he was transitioned to full comfort then there would be opportunity for comfort feeding and drinking.  Phillip Klein inquired if patient could have comfort measures- but also stay full code and be resuscitated.  We discussed that the goal of comfort measure status would be to support patient through dying with symptom management only. Resuscitation would not be in line with this goal of care.  Discussed that in his state comfort measures would look like- stopping IV medications, antibiotics, fluids, dialysis. Would remove ventilator once patient became unresponsive and was actively dying.  At close of discussion Phillip Klein again stated she was not ready to make any changes in his plan of care or code status. She continues to believe that patient has capacity for his own decision making.   Review of Systems  Unable to perform ROS: Acuity of  condition    Length of Stay: 28  Current Medications: Scheduled Meds:  . carvedilol  6.25 mg Per Tube BID WC  . chlorhexidine gluconate (MEDLINE KIT)  15 mL Mouth Rinse BID  . Chlorhexidine Gluconate Cloth  6 each Topical Daily  . Chlorhexidine Gluconate Cloth  6 each Topical Q0600  . Chlorhexidine Gluconate Cloth  6 each Topical Q0600  . clonazePAM  0.5 mg Per Tube BID  . darbepoetin (ARANESP) injection - DIALYSIS  60 mcg Intravenous Q Mon-HD  . feeding supplement (PROSource TF)  45 mL Per Tube 5 X Daily  . fentaNYL  1 patch Transdermal Q72H   And  .  fentaNYL  1 patch Transdermal Q72H  . levothyroxine  125 mcg Per Tube Q0600  . linezolid  600 mg Per Tube Q12H  . mouth rinse  15 mL Mouth Rinse 10 times per day  . midazolam  2 mg Intravenous Once  . multivitamin with minerals  1 tablet Per Tube Daily  . nutrition supplement (JUVEN)  1 packet Per Tube BID BM  . pantoprazole sodium  40 mg Per Tube BID  . sodium chloride flush  10-40 mL Intracatheter Q12H    Continuous Infusions: . sodium chloride 10 mL/hr at 05/27/20 1000  . feeding supplement (OSMOLITE 1.5 CAL) 1,000 mL (05/27/20 1253)    PRN Meds: sodium chloride, acetaminophen, albuterol, alteplase, docusate, fentaNYL (SUBLIMAZE) injection, heparin, hydrALAZINE, iohexol, lidocaine (PF), lidocaine-prilocaine, LORazepam, oxyCODONE, pentafluoroprop-tetrafluoroeth, polyethylene glycol, prochlorperazine, sodium chloride flush  Physical Exam          Vital Signs: BP (!) 147/86   Pulse 66   Temp 97.9 F (36.6 C) (Oral)   Resp 18   Ht _0  (1.88 m)   Wt 104.8 kg   SpO2 99%   BMI 29.66 kg/m  SpO2: SpO2: 99 % O2 Device: O2 Device: Ventilator O2 Flow Rate: O2 Flow Rate (L/min): 60 L/min  Intake/output summary:   Intake/Output Summary (Last 24 hours) at 05/27/2020 1657 Last data filed at 05/27/2020 1000 Gross per 24 hour  Intake 747.22 ml  Output 1515 ml  Net -767.78 ml   LBM: Last BM Date: 05/27/20 Baseline Weight:  Weight: 107.5 kg Most recent weight: Weight: 104.8 kg       Palliative Assessment/Data: PPS: 10%    Flowsheet Rows   Flowsheet Row Most Recent Value  Intake Tab   Referral Department Hospitalist  Unit at Time of Referral ICU  Palliative Care Primary Diagnosis Sepsis/Infectious Disease  Date Notified 05/12/20  Palliative Care Type New Palliative care  Reason for referral Clarify Goals of Care  Date of Admission 05/01/2020  Date first seen by Palliative Care 05/13/20  # of days Palliative referral response time 1 Day(s)  # of days IP prior to Palliative referral 13  Clinical Assessment   Psychosocial & Spiritual Assessment   Palliative Care Outcomes       Patient Active Problem List   Diagnosis Date Noted  . MRSA Pneumonia 05/26/2020  . Severe malnutrition ([place])   . Anasarca   . Acute renal failure superimposed on stage 3b chronic kidney disease ([place])   . Sinus arrest   . Pacemaker   . HFrEF (heart failure with reduced ejection fraction) ([place])   . Anemia   . Jejunostomy tube leak ([place])   . Pleural effusion   . Quadriplegia ([place]) 05/01/2020  . Gram-negative bacteremia 05/01/2020  . Infection due to ESBL-producing [name] pneumoniae 05/01/2020  . Chronic hepatitis C without hepatic coma ([place]) 05/01/2020  . Encounter for feeding tube placement   . Hypotension   . Pressure injury of skin 04/30/2020  . Malnutrition of moderate degree 04/30/2020  . Septic shock ([place]) 04/20/2020    Palliative Care Assessment & Plan   Patient Profile: 55 y.o.malewith past medical history of recurrent sepsis from MRSA,quadraplegiaresulting from surgery for abscess of the spine,trach/vent dependent, J-tube in place,abdominal wound, multiple pressure ulcers, DM, anemia of chronic illness, hepatitis C, cirrhosis, chronic pain, [facility] septicshock secondary to ESBL/[name] bacteremia.Admission has been complicated by  acute kidney injury which has recovered. Palliative medicine consulted for goals of care as patient was refusing  dressing changes and wound care and other care interventions.  Assessment/Recommendations/Plan   Continue current scope of care and code status  RN to contact respiratory re: vent settings, also to administer anxiety medication  Palliative will follow peripherally and monitor for renal recovery- if renal recovery does not occur and nephrology states no long candidate for dialysis- will discuss again with Phillip Klein- will also follow peripherally for other signs of decline and readdress goals of care as needed  Goals of Care and Additional Recommendations:  Limitations on Scope of Treatment: Full Scope Treatment  Code Status:  DNR  Prognosis:   Unable to determine  Discharge Planning:  To Be Determined  Care plan was discussed with Phillip Klein- patient's HCPOA.  Thank you for allowing the Palliative Medicine Team to assist in the care of this patient.   Total time: 46 minutes Greater than 50%  of this time was spent counseling and coordinating care related to the above assessment and plan.  Mariana Kaufman, AGNP-C Palliative Medicine   Please contact Palliative Medicine Team phone at [phone] for questions and concerns.

## 2020-05-27 NOTE — Progress Notes (Signed)
Phillip Klein  Assessment/ Plan:  # Acute kidney Injury: Secondary to ischemic ATN from pressor requiring septic shock (with background history of chronic hypotension). Started dialysis for fluid overload refractory to IV diuretics and azotemia on 3/20. Temporary catheter placed with IR on 3/19 As per patient's [name], the patient has had dialysis in the past and then the kidney function improved.  She wants to try this time as well.  She is aware that patient is not a candidate for long-term outpatient dialysis because of poor functional status and multiple comorbidities.  -I do believe his Cr is overestimated due to lack of muscle mass, kidney function likely lower/worse - HD again today, after today would hold on renal replacement therapy and monitor for recovery. Hopefully he will be able to clear his azotemia. Appreciate ongoing [name] discussions  # Anemia with intermittent bleeding from wounds: Secondary to platelet dysfunction in the setting of severe azotemia.  Received blood transfusion.  Hemoglobin is stable today.  # Sepsis secondary to [name] bacteremia: abx per primary service  # Severe protein calorie malnutrition: Ongoing supplemental nutrition/tube feeds.  # History of diastolic heart failure/anasarca: He has significant anasarca in part from his protein calorie malnutrition/low albumin and associated third spacing with preceding use of Florinef.   Treating with Albumin/furosemide, fluid restriction. UF with HD.   # Chronic ventilator dependent respiratory failure, quadriplegia and multiple pressure wounds/ulcers: With ongoing management for chronic pain/blood loss anemia and awaiting transfer to LTAC.  Subjective: Seen and examined in ICU. Still having nausea. Urine output 1L  Objective Vital signs in last 24 hours: Vitals:   05/27/20 0744 05/27/20 0800 05/27/20 0804 05/27/20 0900  BP:  (!) 168/81 (!) 170/86 (!) 150/83  Pulse:   66 67 79  Resp:  18    Temp: (!) 97.4 F (36.3 C)     TempSrc: Oral     SpO2:  99%    Weight:      Height:       Weight change: -1.4 kg  Intake/Output Summary (Last 24 hours) at 05/27/2020 1108 Last data filed at 05/27/2020 1000 Gross per 24 hour  Intake 789.92 ml  Output 1570 ml  Net -780.08 ml       Labs: Basic Metabolic Panel: Recent Labs  Lab 05/25/20 0300 05/26/20 0430 05/27/20 0630  NA 140 138 138  K 3.9 3.6 3.5  CL 102 101 102  CO2 '24 24 22  ' GLUCOSE 86 92 84  BUN 191* 154* 157*  CREATININE 2.21* 2.07* 2.40*  CALCIUM 9.2 9.2 9.1  PHOS 8.3* 7.6* 8.1*   Liver Function Tests: Recent Labs  Lab 05/25/20 0300 05/26/20 0430 05/27/20 0630  AST '29 26 19  ' ALT '19 17 14  ' ALKPHOS 241* 226* 214*  BILITOT 1.3* 1.2 1.0  PROT 7.4 7.7 7.2  ALBUMIN 3.1* 2.9* 2.6*   No results for input(s): LIPASE, AMYLASE in the last 168 hours. No results for input(s): AMMONIA in the last 168 hours. CBC: Recent Labs  Lab 05/22/20 0527 05/22/20 1658 05/23/20 0659 05/25/20 0300 05/26/20 0430 05/27/20 0630  WBC 6.2 5.0 4.9 5.6 5.6  --   HGB 8.0* 7.8* 8.0* 8.0* 8.0* 9.6*  HCT 25.2* 25.4* 24.9* 25.7* 25.5* 30.9*  MCV 90.0 91.7 89.6 91.5 91.1  --   PLT 125* 120* 111* 138* 121*  --    Cardiac Enzymes: No results for input(s): CKTOTAL, CKMB, CKMBINDEX, TROPONINI in the last 168 hours. CBG: Recent Labs  Lab 05/26/20 1102 05/26/20 1707 05/26/20 2327 05/27/20 0606 05/27/20 0733  GLUCAP 98 86 91 81 94    Iron Studies: No results for input(s): IRON, TIBC, TRANSFERRIN, FERRITIN in the last 72 hours. Studies/Results: IR GJ Tube Change  Result Date: 05/26/2020 INDICATION: Indwelling 66 French gastrojejunal feeding tube with occlusion of the jejunal lumen. EXAM: GASTROJEJUNAL TUBE EXCHANGE UNDER FLUOROSCOPY MEDICATIONS: None ANESTHESIA/SEDATION: None CONTRAST:  20 mL Omnipaque 300 FLUOROSCOPY TIME:  Fluoroscopy Time: 4 minutes and 12 seconds. 91.7 mGy. COMPLICATIONS: None  immediate. PROCEDURE: Informed written consent was obtained from the patient's wife after a thorough discussion of the procedural risks, benefits and alternatives. All questions were addressed. Maximal Sterile Barrier Technique was utilized including caps, mask, sterile gowns, sterile gloves, sterile drape, hand hygiene and skin antiseptic. A timeout was performed prior to the initiation of the procedure. The pre-existing gastrojejunal feeding tube was prepped with chlorhexidine. Fluoroscopy was performed to confirm tube position. The retention balloon was deflated. The tube was retracted under fluoroscopy. The tube was then removed via the gastric lumen over a guidewire. A 5 French catheter was then utilized in obtaining guidewire access through the stomach, duodenum and to the level of the proximal jejunum. A new 62 French balloon retention single-lumen jejunal feeding tube was then advanced over the wire. Tube position was confirmed by fluoroscopy with injection of contrast material. The retention balloon was inflated with 10 mL of saline. FINDINGS: The pre-existing tube is malpositioned with a redundant loop in the stomach such that the tip of the catheter is in the region of the distal antrum of the stomach and potentially just extending into the proximal duodenum. The jejunal lumen was also occluded. A new 76 French single-lumen catheter was advanced over wire through the distal stomach, duodenum and proximal jejunum with the tip located well into the proximal jejunum. This tube flushes normally and is ready for immediate use. IMPRESSION: Replacement of malpositioned and occluded gastrojejunal feeding tube under fluoroscopy. The pre-existing tube had retracted into the stomach with the tip located in the distal stomach/proximal duodenum. A new single-lumen 65 French balloon retention catheter was placed and advanced such that the tip lies in the proximal jejunum. Electronically Signed   By: Aletta Edouard  M.D.   On: 05/26/2020 16:51   DG CHEST PORT 1 VIEW  Result Date: 05/25/2020 CLINICAL DATA:  Pneumonia EXAM: PORTABLE CHEST 1 VIEW COMPARISON:  May 12, 2020 FINDINGS: Tracheostomy catheter tip is 4.2 cm above the carina. Central catheter tips are in the superior vena cava. No pneumothorax. There are small pleural effusions bilaterally. There is airspace opacity in the right mid lung region as well as in each lung base region. Heart is enlarged with mild pulmonary venous hypertension. No adenopathy. Postoperative change noted in lower cervical and upper thoracic regions. Apparent leadless pacemaker present inferiorly slightly to the left of midline. IMPRESSION: Cardiomegaly with pulmonary vascular congestion. Small pleural effusions bilaterally. Airspace opacity in the right mid lung and bibasilar regions concerning for areas of pneumonia, although alveolar edema could present in this manner. Both edema and pneumonia may present concurrently. Leadless pacemaker present on the left inferiorly. Tube and catheter positions as described. No evident pneumothorax. Electronically Signed   By: Lowella Grip III M.D.   On: 05/25/2020 15:28    Medications: Infusions: . sodium chloride 10 mL/hr at 05/27/20 1000  . feeding supplement (OSMOLITE 1.5 CAL)      Scheduled Medications: . carvedilol  6.25 mg Per Tube BID WC  .  chlorhexidine gluconate (MEDLINE KIT)  15 mL Mouth Rinse BID  . Chlorhexidine Gluconate Cloth  6 each Topical Daily  . Chlorhexidine Gluconate Cloth  6 each Topical Q0600  . Chlorhexidine Gluconate Cloth  6 each Topical Q0600  . clonazePAM  0.5 mg Per Tube BID  . darbepoetin (ARANESP) injection - DIALYSIS  60 mcg Intravenous Q Mon-HD  . feeding supplement (PROSource TF)  45 mL Per Tube 5 X Daily  . fentaNYL  1 patch Transdermal Q72H   And  . fentaNYL  1 patch Transdermal Q72H  . levothyroxine  125 mcg Per Tube Q0600  . linezolid  600 mg Per Tube Q12H  . mouth rinse  15 mL Mouth  Rinse 10 times per day  . midazolam  2 mg Intravenous Once  . multivitamin with minerals  1 tablet Per Tube Daily  . nutrition supplement (JUVEN)  1 packet Per Tube BID BM  . pantoprazole sodium  40 mg Per Tube BID  . sodium chloride flush  10-40 mL Intracatheter Q12H    have reviewed scheduled and prn medications.  Physical Exam: General: Elderly male, ill looking sitting up in bed.  On vent via tracheostomy Heart:RRR, s1s2 nl, anasarca present Lungs: Basal decreased breath sound, clear anteriorly Abdomen:soft, Non-tender, distended Extremities: Upper and lower extremities edema/anasarca present Neurology: alert awake Dialysis Access: Right IJ temporary HD catheter site clean.  Phillip Klein 05/27/2020,11:08 AM  LOS: 28 days

## 2020-05-28 DIAGNOSIS — A4902 Methicillin resistant Staphylococcus aureus infection, unspecified site: Secondary | ICD-10-CM | POA: Diagnosis not present

## 2020-05-28 DIAGNOSIS — I959 Hypotension, unspecified: Secondary | ICD-10-CM | POA: Diagnosis not present

## 2020-05-28 DIAGNOSIS — A4159 Other Gram-negative sepsis: Secondary | ICD-10-CM | POA: Diagnosis not present

## 2020-05-28 LAB — CULTURE, BLOOD (ROUTINE X 2)
Culture: NO GROWTH
Culture: NO GROWTH

## 2020-05-28 LAB — RENAL FUNCTION PANEL
Albumin: 2.5 g/dL — ABNORMAL LOW (ref 3.5–5.0)
Anion gap: 11 (ref 5–15)
BUN: 135 mg/dL — ABNORMAL HIGH (ref 6–20)
CO2: 24 mmol/L (ref 22–32)
Calcium: 9.1 mg/dL (ref 8.9–10.3)
Chloride: 102 mmol/L (ref 98–111)
Creatinine, Ser: 2.11 mg/dL — ABNORMAL HIGH (ref 0.61–1.24)
GFR, Estimated: 37 mL/min — ABNORMAL LOW (ref >60)
Glucose, Bld: 119 mg/dL — ABNORMAL HIGH (ref 70–99)
Phosphorus: 7 mg/dL — ABNORMAL HIGH (ref 2.5–4.6)
Potassium: 3.2 mmol/L — ABNORMAL LOW (ref 3.5–5.1)
Sodium: 137 mmol/L (ref 135–145)

## 2020-05-28 LAB — GLUCOSE, CAPILLARY
Glucose-Capillary: 119 mg/dL — ABNORMAL HIGH (ref 70–99)
Glucose-Capillary: 124 mg/dL — ABNORMAL HIGH (ref 70–99)
Glucose-Capillary: 133 mg/dL — ABNORMAL HIGH (ref 70–99)
Glucose-Capillary: 134 mg/dL — ABNORMAL HIGH (ref 70–99)
Glucose-Capillary: 137 mg/dL — ABNORMAL HIGH (ref 70–99)
Glucose-Capillary: 98 mg/dL (ref 70–99)

## 2020-05-28 LAB — HEMOGLOBIN AND HEMATOCRIT, BLOOD
HCT: 25.1 % — ABNORMAL LOW (ref 39.0–52.0)
Hemoglobin: 7.7 g/dL — ABNORMAL LOW (ref 13.0–17.0)

## 2020-05-28 MED ORDER — CARVEDILOL 12.5 MG PO TABS
12.5000 mg | ORAL_TABLET | Freq: Two times a day (BID) | ORAL | Status: DC
  Administered 2020-05-28 – 2020-06-04 (×14): 12.5 mg
  Filled 2020-05-28 (×14): qty 1

## 2020-05-28 MED ORDER — PANTOPRAZOLE SODIUM 40 MG PO PACK
40.0000 mg | PACK | Freq: Every day | ORAL | Status: DC
  Administered 2020-05-28 – 2020-06-06 (×10): 40 mg
  Filled 2020-05-28 (×11): qty 20

## 2020-05-28 MED ORDER — HYDRALAZINE HCL 20 MG/ML IJ SOLN
10.0000 mg | INTRAMUSCULAR | Status: DC | PRN
  Administered 2020-05-28: 20 mg via INTRAVENOUS
  Filled 2020-05-28: qty 1

## 2020-05-28 MED ORDER — CARVEDILOL 12.5 MG PO TABS
6.2500 mg | ORAL_TABLET | Freq: Once | ORAL | Status: AC
  Administered 2020-05-28: 6.25 mg
  Filled 2020-05-28: qty 1

## 2020-05-28 MED ORDER — POTASSIUM CHLORIDE 10 MEQ/100ML IV SOLN
10.0000 meq | INTRAVENOUS | Status: AC
  Administered 2020-05-28 (×2): 10 meq via INTRAVENOUS

## 2020-05-28 NOTE — Progress Notes (Signed)
eLink Physician-Brief Progress Note Patient Name: Phillip Klein DOB: 1965/09/02 MRN: [id number]   Date of Service  05/28/2020  HPI/Events of Note  Patient with sub-optimal blood pressure control on Labetalol 10 mg iv Q 6 hours PRN.  eICU Interventions  Labetalol order changed to 10-20 mg iv Q 4 hours PRN.        Thomasene Lot Phillip Klein 05/28/2020, 12:27 AM

## 2020-05-28 NOTE — Progress Notes (Signed)
[facility] PROGRESS NOTE  Assessment/ Plan:  # Acute kidney Injury: Secondary to ischemic ATN from pressor requiring septic shock (with background history of chronic hypotension). Started dialysis for fluid overload refractory to IV diuretics and azotemia on 3/20. Temporary catheter placed with IR on 3/19 As per patient's HCPOA [name], the patient has had dialysis in the past and then the kidney function improved.  She wants to try this time as well.  She is aware that patient is not a candidate for long-term outpatient dialysis because of poor functional status and multiple comorbidities.  -I do believe his Cr is overestimated due to lack of muscle mass, kidney function likely lower/worse - Will monitor for recovery from now on. If no improvement, then will need to re-engage Mr. Grilliot and family in regards to next step given lack of candidacy for long term HD as above (see hpi). Patient and I discussed this today  # Anemia with intermittent bleeding from wounds: transfuse prn  # Sepsis secondary to [name] bacteremia: abx per primary service  # Severe protein calorie malnutrition: Ongoing supplemental nutrition/tube feeds.  # History of diastolic heart failure/anasarca: He has significant anasarca in part from his protein calorie malnutrition/low albumin and associated third spacing with preceding use of Florinef.  UF with HD. Consider lasix with albumin if urine output goes down or has worsening volume status   # Chronic ventilator dependent respiratory failure, quadriplegia and multiple pressure wounds/ulcers: With ongoing management for chronic pain/blood loss anemia and awaiting transfer to LTAC.  Subjective: Seen and examined in ICU. Tolerated HD yesterday but has issues with flow rates with catheter. Net UF 1624cc, uop 0.7L. He reports that his nausea is a little better.  Mr. Hogenson and I discussed that I will be holding on dialysis and monitoring for  renal recovery. I did explain to him that if his kidneys don't turn around or if he continues to have azotemia/uremia then will need to discuss next steps since doing long term dialysis would not improve his quality of life. He agrees with plan  Objective Vital signs in last 24 hours: Vitals:   05/28/20 1000 05/28/20 1100 05/28/20 1102 05/28/20 1140  BP: (!) 142/73 (!) 148/68  (!) 145/69  Pulse: 81 72  75  Resp: 17 (!) 0  18  Temp:   97.6 F (36.4 C)   TempSrc:   Oral   SpO2: 98% 98%  95%  Weight:      Height:       Weight change: -2.6 kg  Intake/Output Summary (Last 24 hours) at 05/28/2020 1205 Last data filed at 05/28/2020 1100 Gross per 24 hour  Intake 924.09 ml  Output 2321 ml  Net -1396.91 ml       Labs: Basic Metabolic Panel: Recent Labs  Lab 05/26/20 0430 05/27/20 0630 05/28/20 0300  NA 138 138 137  K 3.6 3.5 3.2*  CL 101 102 102  CO2 24 22 24  GLUCOSE 92 84 119*  BUN 154* 157* 135*  CREATININE 2.07* 2.40* 2.11*  CALCIUM 9.2 9.1 9.1  PHOS 7.6* 8.1* 7.0*   Liver Function Tests: Recent Labs  Lab 05/25/20 0300 05/26/20 0430 05/27/20 0630 05/28/20 0300  AST 29 26 19  --   ALT 19 17 14  --   ALKPHOS 241* 226* 214*  --   BILITOT 1.3* 1.2 1.0  --   PROT 7.4 7.7 7.2  --   ALBUMIN 3.1* 2.9* 2.6* 2.5*   No results for   input(s): LIPASE, AMYLASE in the last 168 hours. No results for input(s): AMMONIA in the last 168 hours. CBC: Recent Labs  Lab 05/22/20 1658 05/23/20 0659 05/25/20 0300 05/26/20 0430 05/27/20 0630 05/27/20 1040 05/28/20 0300  WBC 5.0 4.9 5.6 5.6  --  7.6  --   HGB 7.8* 8.0* 8.0* 8.0* 9.6* 7.6* 7.7*  HCT 25.4* 24.9* 25.7* 25.5* 30.9* 24.6* 25.1*  MCV 91.7 89.6 91.5 91.1  --  93.5  --   PLT 120* 111* 138* 121*  --  139*  --    Cardiac Enzymes: No results for input(s): CKTOTAL, CKMB, CKMBINDEX, TROPONINI in the last 168 hours. CBG: Recent Labs  Lab 05/27/20 2327 05/28/20 0318 05/28/20 0514 05/28/20 0753 05/28/20 1100   GLUCAP 102* 98 119* 124* 134*    Iron Studies: No results for input(s): IRON, TIBC, TRANSFERRIN, FERRITIN in the last 72 hours. Studies/Results: IR GJ Tube Change  Result Date: 05/26/2020 INDICATION: Indwelling 50 French gastrojejunal feeding tube with occlusion of the jejunal lumen. EXAM: GASTROJEJUNAL TUBE EXCHANGE UNDER FLUOROSCOPY MEDICATIONS: None ANESTHESIA/SEDATION: None CONTRAST:  20 mL Omnipaque 300 FLUOROSCOPY TIME:  Fluoroscopy Time: 4 minutes and 12 seconds. 91.7 mGy. COMPLICATIONS: None immediate. PROCEDURE: Informed written consent was obtained from the patient's wife after a thorough discussion of the procedural risks, benefits and alternatives. All questions were addressed. Maximal Sterile Barrier Technique was utilized including caps, mask, sterile gowns, sterile gloves, sterile drape, hand hygiene and skin antiseptic. A timeout was performed prior to the initiation of the procedure. The pre-existing gastrojejunal feeding tube was prepped with chlorhexidine. Fluoroscopy was performed to confirm tube position. The retention balloon was deflated. The tube was retracted under fluoroscopy. The tube was then removed via the gastric lumen over a guidewire. A 5 French catheter was then utilized in obtaining guidewire access through the stomach, duodenum and to the level of the proximal jejunum. A new 12 French balloon retention single-lumen jejunal feeding tube was then advanced over the wire. Tube position was confirmed by fluoroscopy with injection of contrast material. The retention balloon was inflated with 10 mL of saline. FINDINGS: The pre-existing tube is malpositioned with a redundant loop in the stomach such that the tip of the catheter is in the region of the distal antrum of the stomach and potentially just extending into the proximal duodenum. The jejunal lumen was also occluded. A new 40 French single-lumen catheter was advanced over wire through the distal stomach, duodenum and  proximal jejunum with the tip located well into the proximal jejunum. This tube flushes normally and is ready for immediate use. IMPRESSION: Replacement of malpositioned and occluded gastrojejunal feeding tube under fluoroscopy. The pre-existing tube had retracted into the stomach with the tip located in the distal stomach/proximal duodenum. A new single-lumen 92 French balloon retention catheter was placed and advanced such that the tip lies in the proximal jejunum. Electronically Signed   By: Aletta Edouard M.D.   On: 05/26/2020 16:51    Medications: Infusions: . sodium chloride 10 mL/hr at 05/28/20 1100  . feeding supplement (OSMOLITE 1.5 CAL) 50 mL/hr at 05/28/20 0900    Scheduled Medications: . carvedilol  12.5 mg Per Tube BID WC  . chlorhexidine gluconate (MEDLINE KIT)  15 mL Mouth Rinse BID  . Chlorhexidine Gluconate Cloth  6 each Topical Daily  . Chlorhexidine Gluconate Cloth  6 each Topical Q0600  . Chlorhexidine Gluconate Cloth  6 each Topical Q0600  . clonazePAM  0.5 mg Per Tube BID  . darbepoetin (ARANESP)  injection - DIALYSIS  60 mcg Intravenous Q Mon-HD  . feeding supplement (PROSource TF)  45 mL Per Tube 5 X Daily  . fentaNYL  1 patch Transdermal Q72H   And  . fentaNYL  1 patch Transdermal Q72H  . levothyroxine  125 mcg Per Tube Q0600  . linezolid  600 mg Per Tube Q12H  . mouth rinse  15 mL Mouth Rinse 10 times per day  . midazolam  2 mg Intravenous Once  . multivitamin with minerals  1 tablet Per Tube Daily  . nutrition supplement (JUVEN)  1 packet Per Tube BID BM  . pantoprazole sodium  40 mg Per Tube Daily  . sodium chloride flush  10-40 mL Intracatheter Q12H    have reviewed scheduled and prn medications.  Physical Exam: General: Elderly male, ill looking sitting up in bed.  On vent via tracheostomy Heart:RRR, s1s2 nl, anasarca present Lungs: Basal decreased breath sound, clear anteriorly Abdomen:soft, Non-tender, distended Extremities: Upper and lower  extremities edema/anasarca present Neurology: alert awake Dialysis Access: Right IJ temporary HD catheter site clean.  Lundynn Cohoon 05/28/2020,12:05 PM  LOS: 29 days

## 2020-05-28 NOTE — Progress Notes (Signed)
PROGRESS NOTE    Phillip Klein  RZN:[id number] DOB: June 19, 1965 DOA: 05/03/2020 PCP: [name], MD    Brief Narrative: This 55 years old male with PMH significant for chronic hypoxic respiratory failure with tracheostomy and vent since 2021 after C-spine surgery, GERD, DM, anemia of chronic disease, quadriplegia, malnutrition, neurogenic orthostatic hypotension, untreated HCV, previous IV drug use, cirrhosis, chronic pain syndrome was sent from [facility] with septic shock. Patient admitted in the ICU with septic shock secondary to ESBL/ [name] bacteremia. Patient had Hickman's catheter which was removed, presumed to be the source of infection. Patient has completed meropenem for 10 days after removal of the catheter. Patient has developed acute kidney injury secondary to ischemic ATN from pressors requiring septic shock.,Cardiology,Nephrology is following. PCCM is assisting with vent management. Patient had multiple chronic bedsores,wound care is following. Palliative care consulted to discuss goals of care.  Patient eventually started on hemodialysis.  Patient is not a candidate for long-term outpatient dialysis because of poor functional status and multiple comorbidities.  Palliative care consulted and patient and family wants full scope of treatment.  Assessment & Plan:   Principal Problem:   MRSA Pneumonia Active Problems:   Septic shock ([facility])   Pressure injury of skin   Malnutrition of moderate degree   Encounter for feeding tube placement   Hypotension   Quadriplegia (HCC)   Gram-negative bacteremia   Infection due to ESBL-producing [name] pneumoniae   Chronic hepatitis C without hepatic coma (HCC)   Anemia   Jejunostomy tube leak (HCC)   Pleural effusion   Sinus arrest   Pacemaker   HFrEF (heart failure with reduced ejection fraction) (HCC)   Severe malnutrition (HCC)   Anasarca   Acute renal failure superimposed on stage 3b chronic kidney  disease (HCC)   Septic shock, POA secondary to [name] bacteremia, UTI versus line infection  -Hickman catheter presumed to be the source of infection, removed. -Blood cultures + [name] bacteremia, UA positive, culture showed multiple species -Completed meropenem for 10 days on 05/12/2020, repeat blood cultures negative -PICC line inserted on 3/8, venous Doppler showed no DVT. -Sepsis physiology resolved.   Active problems Acute kidney injury secondary to ischemic ATN, septic shock requiring pressors, chronic hypotension, anasarca -Patient was placed on albumin and Lasix for anasarca, per nephrology has not been effective. -Nephrology following, not a candidate for long-term dialysis,  however may be an option for management of severe azotemia. -Palliative medicine also following.  Patient and family wants to have full scope of treatment. -05/22/20: Hemodialysis is planned -Patient underwent hemodialysis 3/20, 3/21, 3/22 and 3/24 with 2 L UF.  Hold onto further dialysis as per nephrology. - Bun has slightly improved.  Monitor for renal recovery.  Acute blood loss anemia with intermittent bleeding from wounds : -Platelet dysfunction in the setting of severe azotemia. -Nephrology recommended the conjugated estrogen (Premarin) via tube for 5 days, completed 3/20. -Hemoglobin again down to 6.7, received 2 units PRBC -Hemoglobin is 8 g/dL > 9.6 g/dl > 7.6 > 7.7  Essential hypertension Continue Coreg, hydralazine IV as needed with parameters. Increase coreg 12.5 bid  as BP been elevated.  Acute on chronic combined systolic and diastolic CHF exacerbation with anasarca: - Other contributers include severe protein calorie malnutrition with hypoalbuminemia and third spacing -2D echo showed EF of 40 to 45% with global hypokinesis -Nephrology following, treated with albumin/furosemide, fluid restriction, considered short-term hemodialysis -Patient had hemodialysis on 3/20, 3/21, 3/22,  3/24.  Chronic ventilator dependent respiratory failure, quadriplegia -  Management per CCM, continue ventilatory support with trach. -Continue feeding through tube feed when possible.  The tubes were clogged.   -Interventional radiology team has been consulted to replace G tube. -Patient underwent tracheostomy tube change 3/21. -GJ tube changed 3/23.   Orthostatic hypotension> Resolved. Florinef was discontinued in ICU due to concern for fluid retention, midodrine has been stopped. BP elevated, started coreg.  Chronic pain syndrome -Currently stable, continue fentanyl patch, oxycodone  Depression, insomnia Continue sertraline, trazodone, clonazepam  GERD Continue PPI  Left arm swelling -Venous Dopplers negative for DVT  Hematuria -resolving, urology was consulted, no need for any acute intervention.  Hypothermia: > Resolved. -Patient was pancultured, cultures no growth so far. -Hold on antibiotics. He just completed meropenum x 10 days. -Continue warming blanket. -ID consulted, MRSA +, chest x-ray consistent with pneumonia -ID recommended Linezolid x 7 days.  Multiple pressure wounds, present on admission -Full-thickness vertebral column stage IV -Sacrum stage IV, -Right and left ischial tuberosity, stage IV -Right lateral hip, stage IV -Right and left lower leg lateral, unstageable -Right (unstageable now) and left posterior elbow, stage III - left posterior lower arm, unstageable   05/23/2020: Patient has been refusing wound care/dressing.  Obesity Estimated body mass index is 30.43 kg/m as calculated from the following:   Height as of this encounter: _0  (1.88 m).   Weight as of this encounter: 107.5 kg.   DVT prophylaxis:  SCDs Code Status: Full code. Family Communication:  No family at bed side. Disposition Plan:  Status is: Inpatient  Remains inpatient appropriate because:Inpatient level of care appropriate due to severity of  illness   Dispo: The patient is from: SNF              Anticipated d/c is to: LTAC              Patient currently is not medically stable to d/c.   Difficult to place patient Yes   Transfer back to [facility] or [facility] pending bed availability and cleared from nephrology.   Consultants:   PCCM  Cardiology  Nephrology  IR  Palliative care  Procedures:  Antimicrobials: Anti-infectives (From admission, onward)   Start     Dose/Rate Route Frequency Ordered Stop   05/27/20 2000  linezolid (ZYVOX) 100 MG/5ML suspension 600 mg  Status:  Discontinued        600 mg Per Tube Every 12 hours 05/27/20 0838 05/27/20 1130   05/27/20 2000  linezolid (ZYVOX) tablet 600 mg        600 mg Per Tube Every 12 hours 05/27/20 1130 06/01/20 1959   05/25/20 1845  linezolid (ZYVOX) IVPB 600 mg  Status:  Discontinued        600 mg 300 mL/hr over 60 Minutes Intravenous Every 12 hours 05/25/20 1748 05/27/20 0838   05/19/20 1000  fluconazole (DIFLUCAN) tablet 50 mg       "Followed by" Linked Group Details   50 mg Oral Daily 05/18/20 1048 05/27/20 0929   05/18/20 1145  fluconazole (DIFLUCAN) tablet 100 mg       "Followed by" Linked Group Details   100 mg Oral  Once 05/18/20 1048 05/18/20 1145   05/11/20 0800  meropenem (MERREM) 1 g in sodium chloride 0.9 % 100 mL IVPB        1 g 200 mL/hr over 30 Minutes Intravenous Every 8 hours 05/11/20 0707 05/12/20 1555   05/11/20 0715  meropenem (MERREM) 1 g in sodium chloride 0.9 % 100 mL IVPB  Status:  Discontinued        1 g 200 mL/hr over 30 Minutes Intravenous Every 8 hours 05/11/20 0706 05/11/20 0707   05/03/20 2200  meropenem (MERREM) 1 g in sodium chloride 0.9 % 100 mL IVPB  Status:  Discontinued        1 g 200 mL/hr over 30 Minutes Intravenous Every 12 hours 05/03/20 1041 05/11/20 0706   04/30/20 1145  meropenem (MERREM) 2 g in sodium chloride 0.9 % 100 mL IVPB  Status:  Discontinued        2 g 200 mL/hr over 30 Minutes Intravenous Every 12 hours  04/30/20 1052 05/03/20 1041   04/30/20 0530  ceFEPIme (MAXIPIME) 2 g in sodium chloride 0.9 % 100 mL IVPB  Status:  Discontinued        2 g 200 mL/hr over 30 Minutes Intravenous Every 12 hours 04/28/2020 1852 04/30/20 1051   04/06/2020 1852  vancomycin variable dose per unstable renal function (pharmacist dosing)  Status:  Discontinued         Does not apply See admin instructions 04/15/2020 1852 04/30/20 1051   04/10/2020 1630  vancomycin (VANCOREADY) IVPB 2000 mg/400 mL        2,000 mg 200 mL/hr over 120 Minutes Intravenous  Once 05/03/2020 1621 04/06/2020 2008   04/11/2020 1630  ceFEPIme (MAXIPIME) 2 g in sodium chloride 0.9 % 100 mL IVPB        2 g 200 mL/hr over 30 Minutes Intravenous  Once 04/13/2020 1621 04/24/2020 1749     Subjective: Patient was seen and examined at bedside.  Overnight events noted.   Patient still appears pretty swollen with scrotal edema and generalized anasarca.   He reports feeling better, renal functions improving. He communicates by mouthing words. He was nauseous in the morning was given Zofran, no dialysis for today.  Objective: Vitals:   05/28/20 0759 05/28/20 0800 05/28/20 0900 05/28/20 1102  BP:  (!) 168/70 (!) 155/63   Pulse:  74 68   Resp:  18 18   Temp: 97.7 F (36.5 C)   97.6 F (36.4 C)  TempSrc: Oral   Oral  SpO2:  99% 99%   Weight:      Height:        Intake/Output Summary (Last 24 hours) at 05/28/2020 1113 Last data filed at 05/28/2020 0900 Gross per 24 hour  Intake 833.34 ml  Output 2321 ml  Net -1487.66 ml   Filed Weights   05/27/20 1315 05/27/20 1630 05/28/20 0500  Weight: 104.8 kg 103.9 kg 103.8 kg    Examination:  General exam: Appears calm and comfortable, not in any distress.  HEENT: Tracheostomy tube connected with ventilator, vent dependent. Respiratory system: Clear to auscultation. Respiratory effort normal. Cardiovascular system: S1 & S2 heard, RRR. No JVD, murmurs, rubs, gallops or clicks. +++ pedal edema. Gastrointestinal  system: Abdomen is nondistended, soft and nontender. No organomegaly or masses felt.  Normal bowel sounds heard. Colostomy bag on left. Central nervous system: Alert and oriented x 2. No focal neurological deficits. Extremities: Bilateral heal protectors, Pitting edema, Anasarca. Skin: Multiple pressure ulcers in multiple locations Psychiatry: Judgement and insight appear normal. Mood & affect appropriate.     Data Reviewed: I have personally reviewed following labs and imaging studies  CBC: Recent Labs  Lab 05/22/20 1658 05/23/20 0659 05/25/20 0300 05/26/20 0430 05/27/20 0630 05/27/20 1040 05/28/20 0300  WBC 5.0 4.9 5.6 5.6  --  7.6  --   HGB 7.8* 8.0* 8.0*  8.0* 9.6* 7.6* 7.7*  HCT 25.4* 24.9* 25.7* 25.5* 30.9* 24.6* 25.1*  MCV 91.7 89.6 91.5 91.1  --  93.5  --   PLT 120* 111* 138* 121*  --  139*  --    Basic Metabolic Panel: Recent Labs  Lab 05/24/20 0500 05/25/20 0300 05/26/20 0430 05/27/20 0630 05/28/20 0300  NA 140 140 138 138 137  K 4.1 3.9 3.6 3.5 3.2*  CL 101 102 101 102 102  CO2 _0 GLUCOSE 97 86 92 84 119*  BUN 269* 191* 154* 157* 135*  CREATININE 2.73* 2.21* 2.07* 2.40* 2.11*  CALCIUM 9.2 9.2 9.2 9.1 9.1  MG  --  2.0 1.9  --   --   PHOS 10.6* 8.3* 7.6* 8.1* 7.0*   GFR: Estimated Creatinine Clearance: 52.6 mL/min (A) (by C-G formula based on SCr of 2.11 mg/dL (H)). Liver Function Tests: Recent Labs  Lab 05/24/20 0500 05/25/20 0300 05/26/20 0430 05/27/20 0630 05/28/20 0300  AST _1 --   ALT _2 --   ALKPHOS 222* 241* 226* 214*  --   BILITOT 1.5* 1.3* 1.2 1.0  --   PROT 7.8 7.4 7.7 7.2  --   ALBUMIN 3.4* 3.1* 2.9* 2.6* 2.5*   No results for input(s): LIPASE, AMYLASE in the last 168 hours. No results for input(s): AMMONIA in the last 168 hours. Coagulation Profile: No results for input(s): INR, PROTIME in the last 168 hours. Cardiac Enzymes: No results for input(s): CKTOTAL, CKMB, CKMBINDEX, TROPONINI in the last  168 hours. BNP (last 3 results) No results for input(s): PROBNP in the last 8760 hours. HbA1C: No results for input(s): HGBA1C in the last 72 hours. CBG: Recent Labs  Lab 05/27/20 2327 05/28/20 0318 05/28/20 0514 05/28/20 0753 05/28/20 1100  GLUCAP 102* 98 119* 124* 134*   Lipid Profile: No results for input(s): CHOL, HDL, LDLCALC, TRIG, CHOLHDL, LDLDIRECT in the last 72 hours. Thyroid Function Tests: No results for input(s): TSH, T4TOTAL, FREET4, T3FREE, THYROIDAB in the last 72 hours. Anemia Panel: No results for input(s): VITAMINB12, FOLATE, FERRITIN, TIBC, IRON, RETICCTPCT in the last 72 hours. Sepsis Labs: No results for input(s): PROCALCITON, LATICACIDVEN in the last 168 hours.  Recent Results (from the past 240 hour(s))  Culture, Urine     Status: Abnormal   Collection Time: 05/23/20  9:31 AM   Specimen: Urine, Random  Result Value Ref Range Status   Specimen Description URINE, RANDOM  Final   Special Requests   Final    NONE Performed at [facility], [address] [place], [place] [address]    Culture MULTIPLE SPECIES PRESENT, SUGGEST RECOLLECTION (A)  Final   Report Status 05/24/2020 FINAL  Final  Culture, blood (routine x 2)     Status: None   Collection Time: 05/23/20 10:02 AM   Specimen: BLOOD LEFT HAND  Result Value Ref Range Status   Specimen Description BLOOD LEFT HAND  Final   Special Requests   Final    BOTTLES DRAWN AEROBIC ONLY Blood Culture results may not be optimal due to an inadequate volume of blood received in culture bottles   Culture   Final    NO GROWTH 5 DAYS Performed at [facility], [place] [address], [place], [place] [address]    Report Status 05/28/2020 FINAL  Final  Culture, blood (routine x 2)     Status: None   Collection Time: 05/23/20 10:17  AM   Specimen: BLOOD LEFT HAND  Result Value Ref Range Status   Specimen Description BLOOD LEFT HAND  Final   Special Requests   Final    BOTTLES DRAWN AEROBIC ONLY Blood  Culture results may not be optimal due to an inadequate volume of blood received in culture bottles   Culture   Final    NO GROWTH 5 DAYS Performed at [facility], [place] [address] [place], [place] [address]    Report Status 05/28/2020 FINAL  Final  Culture, Respiratory w Gram Stain     Status: None   Collection Time: 05/23/20  5:12 PM   Specimen: Tracheal Aspirate; Respiratory  Result Value Ref Range Status   Specimen Description TRACHEAL ASPIRATE  Final   Special Requests NONE  Final   Gram Stain   Final    MODERATE WBC PRESENT, PREDOMINANTLY PMN ABUNDANT GRAM POSITIVE COCCI IN PAIRS IN CHAINS Performed at [facility], [place] [address], [place], [place] [address]    Culture   Final    MODERATE METHICILLIN RESISTANT STAPHYLOCOCCUS AUREUS   Report Status 05/25/2020 FINAL  Final   Organism ID, Bacteria METHICILLIN RESISTANT STAPHYLOCOCCUS AUREUS  Final      Susceptibility   Methicillin resistant staphylococcus aureus - MIC*    CIPROFLOXACIN >=8 RESISTANT Resistant     ERYTHROMYCIN >=8 RESISTANT Resistant     GENTAMICIN <=0.5 SENSITIVE Sensitive     OXACILLIN >=4 RESISTANT Resistant     TETRACYCLINE <=1 SENSITIVE Sensitive     VANCOMYCIN <=0.5 SENSITIVE Sensitive     TRIMETH/SULFA <=10 SENSITIVE Sensitive     CLINDAMYCIN >=8 RESISTANT Resistant     RIFAMPIN <=0.5 SENSITIVE Sensitive     Inducible Clindamycin NEGATIVE Sensitive     * MODERATE METHICILLIN RESISTANT STAPHYLOCOCCUS AUREUS    Radiology Studies: IR GJ Tube Change  Result Date: 05/26/2020 INDICATION: Indwelling 22 French gastrojejunal feeding tube with occlusion of the jejunal lumen. EXAM: GASTROJEJUNAL TUBE EXCHANGE UNDER FLUOROSCOPY MEDICATIONS: None ANESTHESIA/SEDATION: None CONTRAST:  20 mL Omnipaque 300 FLUOROSCOPY TIME:  Fluoroscopy Time: 4 minutes and 12 seconds. 91.7 mGy. COMPLICATIONS: None immediate. PROCEDURE: Informed written consent was obtained from the patient's wife after a thorough discussion  of the procedural risks, benefits and alternatives. All questions were addressed. Maximal Sterile Barrier Technique was utilized including caps, mask, sterile gowns, sterile gloves, sterile drape, hand hygiene and skin antiseptic. A timeout was performed prior to the initiation of the procedure. The pre-existing gastrojejunal feeding tube was prepped with chlorhexidine. Fluoroscopy was performed to confirm tube position. The retention balloon was deflated. The tube was retracted under fluoroscopy. The tube was then removed via the gastric lumen over a guidewire. A 5 French catheter was then utilized in obtaining guidewire access through the stomach, duodenum and to the level of the proximal jejunum. A new 68 French balloon retention single-lumen jejunal feeding tube was then advanced over the wire. Tube position was confirmed by fluoroscopy with injection of contrast material. The retention balloon was inflated with 10 mL of saline. FINDINGS: The pre-existing tube is malpositioned with a redundant loop in the stomach such that the tip of the catheter is in the region of the distal antrum of the stomach and potentially just extending into the proximal duodenum. The jejunal lumen was also occluded. A new 28 French single-lumen catheter was advanced over wire through the distal stomach, duodenum and proximal jejunum with the tip located well into the proximal jejunum. This tube flushes normally and  is ready for immediate use. IMPRESSION: Replacement of malpositioned and occluded gastrojejunal feeding tube under fluoroscopy. The pre-existing tube had retracted into the stomach with the tip located in the distal stomach/proximal duodenum. A new single-lumen 43 French balloon retention catheter was placed and advanced such that the tip lies in the proximal jejunum. Electronically Signed   By: Aletta Edouard M.D.   On: 05/26/2020 16:51   Scheduled Meds: . carvedilol  12.5 mg Per Tube BID WC  . chlorhexidine gluconate  (MEDLINE KIT)  15 mL Mouth Rinse BID  . Chlorhexidine Gluconate Cloth  6 each Topical Daily  . Chlorhexidine Gluconate Cloth  6 each Topical Q0600  . Chlorhexidine Gluconate Cloth  6 each Topical Q0600  . clonazePAM  0.5 mg Per Tube BID  . darbepoetin (ARANESP) injection - DIALYSIS  60 mcg Intravenous Q Mon-HD  . feeding supplement (PROSource TF)  45 mL Per Tube 5 X Daily  . fentaNYL  1 patch Transdermal Q72H   And  . fentaNYL  1 patch Transdermal Q72H  . levothyroxine  125 mcg Per Tube Q0600  . linezolid  600 mg Per Tube Q12H  . mouth rinse  15 mL Mouth Rinse 10 times per day  . midazolam  2 mg Intravenous Once  . multivitamin with minerals  1 tablet Per Tube Daily  . nutrition supplement (JUVEN)  1 packet Per Tube BID BM  . pantoprazole sodium  40 mg Per Tube Daily  . sodium chloride flush  10-40 mL Intracatheter Q12H   Continuous Infusions: . sodium chloride Stopped (05/28/20 0853)  . feeding supplement (OSMOLITE 1.5 CAL) 50 mL/hr at 05/28/20 0900     LOS: 29 days    Time spent: 35 mins    Luretha Eberly, MD [facility]   If 7PM-7AM, please contact night-coverage

## 2020-05-29 DIAGNOSIS — I959 Hypotension, unspecified: Secondary | ICD-10-CM | POA: Diagnosis not present

## 2020-05-29 DIAGNOSIS — A4159 Other Gram-negative sepsis: Secondary | ICD-10-CM | POA: Diagnosis not present

## 2020-05-29 DIAGNOSIS — A4902 Methicillin resistant Staphylococcus aureus infection, unspecified site: Secondary | ICD-10-CM | POA: Diagnosis not present

## 2020-05-29 LAB — HEMOGLOBIN AND HEMATOCRIT, BLOOD
HCT: 24.7 % — ABNORMAL LOW (ref 39.0–52.0)
Hemoglobin: 7.8 g/dL — ABNORMAL LOW (ref 13.0–17.0)

## 2020-05-29 LAB — GLUCOSE, CAPILLARY
Glucose-Capillary: 134 mg/dL — ABNORMAL HIGH (ref 70–99)
Glucose-Capillary: 137 mg/dL — ABNORMAL HIGH (ref 70–99)
Glucose-Capillary: 140 mg/dL — ABNORMAL HIGH (ref 70–99)
Glucose-Capillary: 142 mg/dL — ABNORMAL HIGH (ref 70–99)

## 2020-05-29 LAB — RENAL FUNCTION PANEL
Albumin: 2.5 g/dL — ABNORMAL LOW (ref 3.5–5.0)
Anion gap: 11 (ref 5–15)
BUN: 148 mg/dL — ABNORMAL HIGH (ref 6–20)
CO2: 24 mmol/L (ref 22–32)
Calcium: 9.1 mg/dL (ref 8.9–10.3)
Chloride: 102 mmol/L (ref 98–111)
Creatinine, Ser: 2.25 mg/dL — ABNORMAL HIGH (ref 0.61–1.24)
GFR, Estimated: 34 mL/min — ABNORMAL LOW (ref >60)
Glucose, Bld: 156 mg/dL — ABNORMAL HIGH (ref 70–99)
Phosphorus: 7.5 mg/dL — ABNORMAL HIGH (ref 2.5–4.6)
Potassium: 3.8 mmol/L (ref 3.5–5.1)
Sodium: 137 mmol/L (ref 135–145)

## 2020-05-29 NOTE — Progress Notes (Signed)
[facility] NEPHROLOGY PROGRESS NOTE  Assessment/ Plan:  # Acute kidney Injury: Secondary to ischemic ATN from pressor requiring septic shock (with background history of chronic hypotension). Started dialysis for fluid overload refractory to IV diuretics and azotemia on 3/20. Temporary catheter placed with IR on 3/19 As per patient's [facility], the patient has had dialysis in the past and then the kidney function improved.  She wants to try this time as well.  She is aware that patient is not a candidate for long-term outpatient dialysis because of poor functional status and multiple comorbidities.  -I do believe his Cr is overestimated due to lack of muscle mass, kidney function likely lower/worse - Will monitor for recovery from now on. If no improvement, then will need to re-engage Mr. Perrier and family in regards to next step given lack of candidacy for long term HD as above (see hpi).  Patient and I discussed this again today  # Anemia with intermittent bleeding from wounds: transfuse prn  # Sepsis secondary to [name] bacteremia: abx per primary service  # Severe protein calorie malnutrition: Ongoing supplemental nutrition/tube feeds.  # History of diastolic heart failure/anasarca: He has significant anasarca in part from his protein calorie malnutrition/low albumin and associated third spacing with preceding use of Florinef.  UF with HD. Consider lasix with albumin if urine output goes down or has worsening volume status   # Chronic ventilator dependent respiratory failure, quadriplegia and multiple pressure wounds/ulcers: With ongoing management for chronic pain/blood loss anemia and awaiting transfer to LTAC.  Subjective: Seen and examined in ICU. Tolerated HD yesterday but has issues with flow rates with catheter. Net UF 1624cc, uop 550 cc. He reports that his nausea is about the same but better as compared to before. Again, Mr. Overbeck and I discussed the plans  on holding dialysis and monitoring for renal recovery. I did explain to him that doing dialysis and potentially tethering him to a procedure three times a week will not improve his overall quality of life, nor will it improve some of his comorbidities. I did explain to him that if it comes to it, then keeping him comfortable may just be in his best interest given everything that he has been through. The entire time, patient did maintain eye contact and nodded his head in agreement and mouthed 'yes.' The reason I had this conversation with him is because apparently (per palliative's consultation), [name] does believe that the patient has the capacity for his own decision making (note 3/24).    Objective Vital signs in last 24 hours: Vitals:   05/29/20 0900 05/29/20 1000 05/29/20 1100 05/29/20 1112  BP: (!) 164/82 (!) 164/87 (!) 168/87 (!) 168/87  Pulse: 73 85 71 71  Resp: '18 18 19 19  ' Temp:      TempSrc:      SpO2: 98% 99% 99% 99%  Weight:      Height:       Weight change:   Intake/Output Summary (Last 24 hours) at 05/29/2020 1242 Last data filed at 05/29/2020 1000 Gross per 24 hour  Intake 1470 ml  Output 1125 ml  Net 345 ml       Labs: Basic Metabolic Panel: Recent Labs  Lab 05/27/20 0630 05/28/20 0300 05/29/20 0300  NA 138 137 137  K 3.5 3.2* 3.8  CL 102 102 102  CO2 '22 24 24  ' GLUCOSE 84 119* 156*  BUN 157* 135* 148*  CREATININE 2.40* 2.11* 2.25*  CALCIUM  9.1 9.1 9.1  PHOS 8.1* 7.0* 7.5*   Liver Function Tests: Recent Labs  Lab 05/25/20 0300 05/26/20 0430 05/27/20 0630 05/28/20 0300 05/29/20 0300  AST '29 26 19  ' --   --   ALT '19 17 14  ' --   --   ALKPHOS 241* 226* 214*  --   --   BILITOT 1.3* 1.2 1.0  --   --   PROT 7.4 7.7 7.2  --   --   ALBUMIN 3.1* 2.9* 2.6* 2.5* 2.5*   No results for input(s): LIPASE, AMYLASE in the last 168 hours. No results for input(s): AMMONIA in the last 168 hours. CBC: Recent Labs  Lab 05/22/20 1658 05/23/20 0659  05/25/20 0300 05/26/20 0430 05/27/20 0630 05/27/20 1040 05/28/20 0300 05/29/20 0300  WBC 5.0 4.9 5.6 5.6  --  7.6  --   --   HGB 7.8* 8.0* 8.0* 8.0*   < > 7.6* 7.7* 7.8*  HCT 25.4* 24.9* 25.7* 25.5*   < > 24.6* 25.1* 24.7*  MCV 91.7 89.6 91.5 91.1  --  93.5  --   --   PLT 120* 111* 138* 121*  --  139*  --   --    < > = values in this interval not displayed.   Cardiac Enzymes: No results for input(s): CKTOTAL, CKMB, CKMBINDEX, TROPONINI in the last 168 hours. CBG: Recent Labs  Lab 05/28/20 1100 05/28/20 1551 05/28/20 2335 05/29/20 0617 05/29/20 1212  GLUCAP 134* 133* 137* 142* 134*    Iron Studies: No results for input(s): IRON, TIBC, TRANSFERRIN, FERRITIN in the last 72 hours. Studies/Results: No results found.  Medications: Infusions: . sodium chloride 10 mL/hr at 05/28/20 1100  . feeding supplement (OSMOLITE 1.5 CAL) 65 mL/hr at 05/29/20 0800    Scheduled Medications: . carvedilol  12.5 mg Per Tube BID WC  . chlorhexidine gluconate (MEDLINE KIT)  15 mL Mouth Rinse BID  . Chlorhexidine Gluconate Cloth  6 each Topical Daily  . Chlorhexidine Gluconate Cloth  6 each Topical Q0600  . Chlorhexidine Gluconate Cloth  6 each Topical Q0600  . clonazePAM  0.5 mg Per Tube BID  . darbepoetin (ARANESP) injection - DIALYSIS  60 mcg Intravenous Q Mon-HD  . feeding supplement (PROSource TF)  45 mL Per Tube 5 X Daily  . fentaNYL  1 patch Transdermal Q72H   And  . fentaNYL  1 patch Transdermal Q72H  . levothyroxine  125 mcg Per Tube Q0600  . linezolid  600 mg Per Tube Q12H  . mouth rinse  15 mL Mouth Rinse 10 times per day  . midazolam  2 mg Intravenous Once  . multivitamin with minerals  1 tablet Per Tube Daily  . nutrition supplement (JUVEN)  1 packet Per Tube BID BM  . pantoprazole sodium  40 mg Per Tube Daily  . sodium chloride flush  10-40 mL Intracatheter Q12H    have reviewed scheduled and prn medications.  Physical Exam: General: Elderly male, ill looking sitting  up in bed.  On vent via tracheostomy Heart:RRR, s1s2 nl Lungs: unlabored, +trach Abdomen:soft, Non-tender, distended Extremities: Upper and lower extremities edema/anasarca present Neurology: alert awake Dialysis Access: Right IJ temporary HD catheter site clean.  Billyjoe Go 05/29/2020,12:42 PM  LOS: 30 days

## 2020-05-29 NOTE — Progress Notes (Signed)
PROGRESS NOTE    Phillip Klein  LEX:[id number] DOB: 13-Nov-1965 DOA: 04/06/2020 PCP: [name], MD    Brief Narrative: This 55 years old male with PMH significant for chronic hypoxic respiratory failure with tracheostomy and vent since 2021 after C-spine surgery, GERD, DM, anemia of chronic disease, quadriplegia, malnutrition, neurogenic orthostatic hypotension, untreated HCV, previous IV drug use, cirrhosis, chronic pain syndrome was sent from [facility] with septic shock. Patient admitted in the ICU with septic shock secondary to ESBL/ [name] bacteremia. Patient had Hickman's catheter which was removed, presumed to be the source of infection. Patient has completed meropenem for 10 days after removal of the catheter. Patient has developed acute kidney injury secondary to ischemic ATN from pressors requiring septic shock.,Cardiology,Nephrology is following. PCCM is assisting with vent management. Patient had multiple chronic bedsores,wound care is following. Palliative care consulted to discuss goals of care.  Patient eventually started on hemodialysis.  Patient is not a candidate for long-term outpatient dialysis because of poor functional status and multiple comorbidities.  Palliative care consulted and patient and family wants full scope of treatment.  Assessment & Plan:   Principal Problem:   MRSA Pneumonia Active Problems:   Septic shock ([place])   Pressure injury of skin   Malnutrition of moderate degree   Encounter for feeding tube placement   Hypotension   Quadriplegia (HCC)   Gram-negative bacteremia   Infection due to ESBL-producing [name] pneumoniae   Chronic hepatitis C without hepatic coma (HCC)   Anemia   Jejunostomy tube leak (HCC)   Pleural effusion   Sinus arrest   Pacemaker   HFrEF (heart failure with reduced ejection fraction) (HCC)   Severe malnutrition (HCC)   Anasarca   Acute renal failure superimposed on stage 3b chronic kidney  disease (HCC)   Septic shock, POA secondary to [name] bacteremia, UTI versus line infection  -Hickman catheter presumed to be the source of infection, removed. -Blood cultures + [name] bacteremia, UA positive, culture showed multiple species -Completed meropenem for 10 days on 05/12/2020, repeat blood cultures negative -PICC line inserted on 3/8, venous Doppler showed no DVT. -Sepsis physiology resolved.   Active problems Acute kidney injury secondary to ischemic ATN, septic shock requiring pressors, chronic hypotension, anasarca -Patient was placed on albumin and Lasix for anasarca, per nephrology has not been effective. -Nephrology following, not a candidate for long-term dialysis,  however may be an option for management of severe azotemia. -Palliative medicine also following.  Patient and family wants to have full scope of treatment. -05/22/20: Hemodialysis is planned -Patient underwent hemodialysis 3/20, 3/21, 3/22 and 3/24 with 2 L UF.  Hold onto further dialysis as per nephrology. -Bun has slightly improved.  Monitor for renal recovery. -If no improvement in renal functions, may need to readdress goals of care discussion.  Acute blood loss anemia with intermittent bleeding from wounds : -Platelet dysfunction in the setting of severe azotemia. -Nephrology recommended the conjugated estrogen (Premarin) via tube for 5 days, completed 3/20. -Hemoglobin again down to 6.7, received 2 units PRBC -Hemoglobin is 8 g/dL > 9.6 g/dl > 7.6 > 7.7> 7.8  Essential hypertension Continue Coreg, hydralazine IV as needed with parameters. Increased coreg 12.5 bid as BP been elevated.  Acute on chronic combined systolic and diastolic CHF exacerbation with anasarca: -Other contributers include severe protein calorie malnutrition with hypoalbuminemia and third spacing -2D echo showed EF of 40 to 45% with global hypokinesis -Nephrology following, treated with albumin/furosemide, fluid  restriction, considered short-term hemodialysis -Patient had hemodialysis  on 3/20, 3/21, 3/22, 3/24.  Chronic ventilator dependent respiratory failure, quadriplegia -Management per CCM, continue ventilatory support with trach. -Continue feeding through tube feed when possible.  The tubes were clogged.   -Interventional radiology team has been consulted to replace G tube. -Patient underwent tracheostomy tube change 3/21. -GJ tube changed 3/23.   Orthostatic hypotension> Resolved. Florinef was discontinued in ICU due to concern for fluid retention, midodrine has been stopped. BP elevated, started coreg.  Chronic pain syndrome -Currently stable, continue fentanyl patch, oxycodone  Depression, insomnia Continue sertraline, trazodone, clonazepam  GERD Continue PPI  Left arm swelling -Venous Dopplers negative for DVT  Hematuria -resolving, urology was consulted, no need for any acute intervention.  Hypothermia: > Resolved. -Patient was pancultured, cultures no growth so far. -Hold on antibiotics. He just completed meropenum x 10 days. -Continue warming blanket. -ID consulted, MRSA +, chest x-ray consistent with pneumonia -ID recommended Linezolid x 7 days.  Multiple pressure wounds, present on admission -Full-thickness vertebral column stage IV -Sacrum stage IV, -Right and left ischial tuberosity, stage IV -Right lateral hip, stage IV -Right and left lower leg lateral, unstageable -Right (unstageable now) and left posterior elbow, stage III - left posterior lower arm, unstageable   05/23/2020: Patient has been refusing wound care/dressing.  Obesity Estimated body mass index is 30.43 kg/m as calculated from the following:   Height as of this encounter: _0  (1.88 m).   Weight as of this encounter: 107.5 kg.   DVT prophylaxis:  SCDs Code Status: Full code. Family Communication:  No family at bed side. Disposition Plan:  Status is: Inpatient  Remains  inpatient appropriate because:Inpatient level of care appropriate due to severity of illness   Dispo: The patient is from: SNF              Anticipated d/c is to: LTAC              Patient currently is not medically stable to d/c.   Difficult to place patient Yes   Transfer back to [facility] or [facility] pending bed availability and cleared from nephrology.   Consultants:   PCCM  Cardiology  Nephrology  IR  Palliative care  Procedures: Tracheostomy change, GJ tube change  Antimicrobials: Anti-infectives (From admission, onward)   Start     Dose/Rate Route Frequency Ordered Stop   05/27/20 2000  linezolid (ZYVOX) 100 MG/5ML suspension 600 mg  Status:  Discontinued        600 mg Per Tube Every 12 hours 05/27/20 0838 05/27/20 1130   05/27/20 2000  linezolid (ZYVOX) tablet 600 mg        600 mg Per Tube Every 12 hours 05/27/20 1130 06/01/20 1959   05/25/20 1845  linezolid (ZYVOX) IVPB 600 mg  Status:  Discontinued        600 mg 300 mL/hr over 60 Minutes Intravenous Every 12 hours 05/25/20 1748 05/27/20 0838   05/19/20 1000  fluconazole (DIFLUCAN) tablet 50 mg       "Followed by" Linked Group Details   50 mg Oral Daily 05/18/20 1048 05/27/20 0929   05/18/20 1145  fluconazole (DIFLUCAN) tablet 100 mg       "Followed by" Linked Group Details   100 mg Oral  Once 05/18/20 1048 05/18/20 1145   05/11/20 0800  meropenem (MERREM) 1 g in sodium chloride 0.9 % 100 mL IVPB        1 g 200 mL/hr over 30 Minutes Intravenous Every 8 hours 05/11/20 0707 05/12/20 1555  05/11/20 0715  meropenem (MERREM) 1 g in sodium chloride 0.9 % 100 mL IVPB  Status:  Discontinued        1 g 200 mL/hr over 30 Minutes Intravenous Every 8 hours 05/11/20 0706 05/11/20 0707   05/03/20 2200  meropenem (MERREM) 1 g in sodium chloride 0.9 % 100 mL IVPB  Status:  Discontinued        1 g 200 mL/hr over 30 Minutes Intravenous Every 12 hours 05/03/20 1041 05/11/20 0706   04/30/20 1145  meropenem (MERREM) 2 g in sodium  chloride 0.9 % 100 mL IVPB  Status:  Discontinued        2 g 200 mL/hr over 30 Minutes Intravenous Every 12 hours 04/30/20 1052 05/03/20 1041   04/30/20 0530  ceFEPIme (MAXIPIME) 2 g in sodium chloride 0.9 % 100 mL IVPB  Status:  Discontinued        2 g 200 mL/hr over 30 Minutes Intravenous Every 12 hours 04/17/2020 1852 04/30/20 1051   04/28/2020 1852  vancomycin variable dose per unstable renal function (pharmacist dosing)  Status:  Discontinued         Does not apply See admin instructions 04/19/2020 1852 04/30/20 1051   04/11/2020 1630  vancomycin (VANCOREADY) IVPB 2000 mg/400 mL        2,000 mg 200 mL/hr over 120 Minutes Intravenous  Once 04/19/2020 1621 04/30/2020 2008   04/21/2020 1630  ceFEPIme (MAXIPIME) 2 g in sodium chloride 0.9 % 100 mL IVPB        2 g 200 mL/hr over 30 Minutes Intravenous  Once 04/17/2020 1621 04/24/2020 1749     Subjective: Patient was seen and examined at bedside.  Overnight events noted.   Patient still appears swollen with scrotal edema and generalized anasarca.   He reports feeling better, renal functions fluctuating , He communicates by mouthing words. He was explained, if no renal recovery,  we might need to readdress goals of care discussion.  Objective: Vitals:   05/29/20 0758 05/29/20 0800 05/29/20 0900 05/29/20 1000  BP:  (!) 180/85 (!) 164/82 (!) 164/87  Pulse:  77 73 85  Resp:  _0 Temp: (!) 97.2 F (36.2 C)     TempSrc: Oral     SpO2:  99% 98% 99%  Weight:      Height:        Intake/Output Summary (Last 24 hours) at 05/29/2020 1107 Last data filed at 05/29/2020 1000 Gross per 24 hour  Intake 1520 ml  Output 1125 ml  Net 395 ml   Filed Weights   05/27/20 1315 05/27/20 1630 05/28/20 0500  Weight: 104.8 kg 103.9 kg 103.8 kg    Examination:  General exam: Appears calm and comfortable, not in any distress.  HEENT: Tracheostomy tube connected with ventilator, vent dependent. Respiratory system: Clear to auscultation. Respiratory effort  normal. Cardiovascular system: S1 & S2 heard, RRR. No JVD, murmurs, rubs, gallops or clicks. +++ pedal edema. Gastrointestinal system: Abdomen is nondistended, soft and nontender. No organomegaly or masses felt.   Normal bowel sounds heard. Colostomy bag on left. Central nervous system: Alert and oriented x 2. No focal neurological deficits. Extremities: Bilateral heal protectors, Pitting edema, Anasarca. Skin: Multiple pressure ulcers in multiple locations. Psychiatry: Judgement and insight appear normal. Mood & affect appropriate.     Data Reviewed: I have personally reviewed following labs and imaging studies  CBC: Recent Labs  Lab 05/22/20 1658 05/23/20 0659 05/25/20 0300 05/26/20 0430 05/27/20 0630 05/27/20 1040  05/28/20 0300 05/29/20 0300  WBC 5.0 4.9 5.6 5.6  --  7.6  --   --   HGB 7.8* 8.0* 8.0* 8.0* 9.6* 7.6* 7.7* 7.8*  HCT 25.4* 24.9* 25.7* 25.5* 30.9* 24.6* 25.1* 24.7*  MCV 91.7 89.6 91.5 91.1  --  93.5  --   --   PLT 120* 111* 138* 121*  --  139*  --   --    Basic Metabolic Panel: Recent Labs  Lab 05/25/20 0300 05/26/20 0430 05/27/20 0630 05/28/20 0300 05/29/20 0300  NA 140 138 138 137 137  K 3.9 3.6 3.5 3.2* 3.8  CL 102 101 102 102 102  CO2 _0 GLUCOSE 86 92 84 119* 156*  BUN 191* 154* 157* 135* 148*  CREATININE 2.21* 2.07* 2.40* 2.11* 2.25*  CALCIUM 9.2 9.2 9.1 9.1 9.1  MG 2.0 1.9  --   --   --   PHOS 8.3* 7.6* 8.1* 7.0* 7.5*   GFR: Estimated Creatinine Clearance: 49.3 mL/min (A) (by C-G formula based on SCr of 2.25 mg/dL (H)). Liver Function Tests: Recent Labs  Lab 05/24/20 0500 05/25/20 0300 05/26/20 0430 05/27/20 0630 05/28/20 0300 05/29/20 0300  AST _1 --   --   ALT _2 --   --   ALKPHOS 222* 241* 226* 214*  --   --   BILITOT 1.5* 1.3* 1.2 1.0  --   --   PROT 7.8 7.4 7.7 7.2  --   --   ALBUMIN 3.4* 3.1* 2.9* 2.6* 2.5* 2.5*   No results for input(s): LIPASE, AMYLASE in the last 168 hours. No results  for input(s): AMMONIA in the last 168 hours. Coagulation Profile: No results for input(s): INR, PROTIME in the last 168 hours. Cardiac Enzymes: No results for input(s): CKTOTAL, CKMB, CKMBINDEX, TROPONINI in the last 168 hours. BNP (last 3 results) No results for input(s): PROBNP in the last 8760 hours. HbA1C: No results for input(s): HGBA1C in the last 72 hours. CBG: Recent Labs  Lab 05/28/20 0753 05/28/20 1100 05/28/20 1551 05/28/20 2335 05/29/20 0617  GLUCAP 124* 134* 133* 137* 142*   Lipid Profile: No results for input(s): CHOL, HDL, LDLCALC, TRIG, CHOLHDL, LDLDIRECT in the last 72 hours. Thyroid Function Tests: No results for input(s): TSH, T4TOTAL, FREET4, T3FREE, THYROIDAB in the last 72 hours. Anemia Panel: No results for input(s): VITAMINB12, FOLATE, FERRITIN, TIBC, IRON, RETICCTPCT in the last 72 hours. Sepsis Labs: No results for input(s): PROCALCITON, LATICACIDVEN in the last 168 hours.  Recent Results (from the past 240 hour(s))  Culture, Urine     Status: Abnormal   Collection Time: 05/23/20  9:31 AM   Specimen: Urine, Random  Result Value Ref Range Status   Specimen Description URINE, RANDOM  Final   Special Requests   Final    NONE Performed at [facility], [address] [place], [place] [address]    Culture MULTIPLE SPECIES PRESENT, SUGGEST RECOLLECTION (A)  Final   Report Status 05/24/2020 FINAL  Final  Culture, blood (routine x 2)     Status: None   Collection Time: 05/23/20 10:02 AM   Specimen: BLOOD LEFT HAND  Result Value Ref Range Status   Specimen Description BLOOD LEFT HAND  Final   Special Requests   Final    BOTTLES DRAWN AEROBIC ONLY Blood Culture results may not be optimal due to an inadequate volume of blood received in culture bottles  Culture   Final    NO GROWTH 5 DAYS Performed at [facility], [place] [address] [place], [place] [address]    Report Status 05/28/2020 FINAL  Final  Culture, blood (routine x 2)      Status: None   Collection Time: 05/23/20 10:17 AM   Specimen: BLOOD LEFT HAND  Result Value Ref Range Status   Specimen Description BLOOD LEFT HAND  Final   Special Requests   Final    BOTTLES DRAWN AEROBIC ONLY Blood Culture results may not be optimal due to an inadequate volume of blood received in culture bottles   Culture   Final    NO GROWTH 5 DAYS Performed at [facility], [place] [address] [place], [place] [address]    Report Status 05/28/2020 FINAL  Final  Culture, Respiratory w Gram Stain     Status: None   Collection Time: 05/23/20  5:12 PM   Specimen: Tracheal Aspirate; Respiratory  Result Value Ref Range Status   Specimen Description TRACHEAL ASPIRATE  Final   Special Requests NONE  Final   Gram Stain   Final    MODERATE WBC PRESENT, PREDOMINANTLY PMN ABUNDANT GRAM POSITIVE COCCI IN PAIRS IN CHAINS Performed at [facility], [place] [address] [place], [place] [address]    Culture   Final    MODERATE METHICILLIN RESISTANT STAPHYLOCOCCUS AUREUS   Report Status 05/25/2020 FINAL  Final   Organism ID, Bacteria METHICILLIN RESISTANT STAPHYLOCOCCUS AUREUS  Final      Susceptibility   Methicillin resistant staphylococcus aureus - MIC*    CIPROFLOXACIN >=8 RESISTANT Resistant     ERYTHROMYCIN >=8 RESISTANT Resistant     GENTAMICIN <=0.5 SENSITIVE Sensitive     OXACILLIN >=4 RESISTANT Resistant     TETRACYCLINE <=1 SENSITIVE Sensitive     VANCOMYCIN <=0.5 SENSITIVE Sensitive     TRIMETH/SULFA <=10 SENSITIVE Sensitive     CLINDAMYCIN >=8 RESISTANT Resistant     RIFAMPIN <=0.5 SENSITIVE Sensitive     Inducible Clindamycin NEGATIVE Sensitive     * MODERATE METHICILLIN RESISTANT STAPHYLOCOCCUS AUREUS    Radiology Studies: No results found. Scheduled Meds: . carvedilol  12.5 mg Per Tube BID WC  . chlorhexidine gluconate (MEDLINE KIT)  15 mL Mouth Rinse BID  . Chlorhexidine Gluconate Cloth  6 each Topical Daily  . Chlorhexidine Gluconate Cloth  6 each Topical Q0600   . Chlorhexidine Gluconate Cloth  6 each Topical Q0600  . clonazePAM  0.5 mg Per Tube BID  . darbepoetin (ARANESP) injection - DIALYSIS  60 mcg Intravenous Q Mon-HD  . feeding supplement (PROSource TF)  45 mL Per Tube 5 X Daily  . fentaNYL  1 patch Transdermal Q72H   And  . fentaNYL  1 patch Transdermal Q72H  . levothyroxine  125 mcg Per Tube Q0600  . linezolid  600 mg Per Tube Q12H  . mouth rinse  15 mL Mouth Rinse 10 times per day  . midazolam  2 mg Intravenous Once  . multivitamin with minerals  1 tablet Per Tube Daily  . nutrition supplement (JUVEN)  1 packet Per Tube BID BM  . pantoprazole sodium  40 mg Per Tube Daily  . sodium chloride flush  10-40 mL Intracatheter Q12H   Continuous Infusions: . sodium chloride 10 mL/hr at 05/28/20 1100  . feeding supplement (OSMOLITE 1.5 CAL) 65 mL/hr at 05/29/20 0800     LOS: 30 days    Time spent: 35 mins  Shawna Clamp, MD [facility]   If 7PM-7AM, please contact night-coverage

## 2020-05-30 DIAGNOSIS — A4159 Other Gram-negative sepsis: Secondary | ICD-10-CM | POA: Diagnosis not present

## 2020-05-30 DIAGNOSIS — A4902 Methicillin resistant Staphylococcus aureus infection, unspecified site: Secondary | ICD-10-CM | POA: Diagnosis not present

## 2020-05-30 DIAGNOSIS — I959 Hypotension, unspecified: Secondary | ICD-10-CM | POA: Diagnosis not present

## 2020-05-30 LAB — BASIC METABOLIC PANEL
Anion gap: 11 (ref 5–15)
BUN: 154 mg/dL — ABNORMAL HIGH (ref 6–20)
CO2: 23 mmol/L (ref 22–32)
Calcium: 9.3 mg/dL (ref 8.9–10.3)
Chloride: 104 mmol/L (ref 98–111)
Creatinine, Ser: 2.26 mg/dL — ABNORMAL HIGH (ref 0.61–1.24)
GFR, Estimated: 34 mL/min — ABNORMAL LOW (ref >60)
Glucose, Bld: 154 mg/dL — ABNORMAL HIGH (ref 70–99)
Potassium: 4 mmol/L (ref 3.5–5.1)
Sodium: 138 mmol/L (ref 135–145)

## 2020-05-30 LAB — RENAL FUNCTION PANEL
Albumin: 2.4 g/dL — ABNORMAL LOW (ref 3.5–5.0)
Anion gap: 9 (ref 5–15)
BUN: 160 mg/dL — ABNORMAL HIGH (ref 6–20)
CO2: 25 mmol/L (ref 22–32)
Calcium: 9.2 mg/dL (ref 8.9–10.3)
Chloride: 105 mmol/L (ref 98–111)
Creatinine, Ser: 2.27 mg/dL — ABNORMAL HIGH (ref 0.61–1.24)
GFR, Estimated: 34 mL/min — ABNORMAL LOW (ref >60)
Glucose, Bld: 155 mg/dL — ABNORMAL HIGH (ref 70–99)
Phosphorus: 7.3 mg/dL — ABNORMAL HIGH (ref 2.5–4.6)
Potassium: 4.1 mmol/L (ref 3.5–5.1)
Sodium: 139 mmol/L (ref 135–145)

## 2020-05-30 LAB — GLUCOSE, CAPILLARY
Glucose-Capillary: 126 mg/dL — ABNORMAL HIGH (ref 70–99)
Glucose-Capillary: 134 mg/dL — ABNORMAL HIGH (ref 70–99)
Glucose-Capillary: 147 mg/dL — ABNORMAL HIGH (ref 70–99)

## 2020-05-30 LAB — HEMOGLOBIN AND HEMATOCRIT, BLOOD
HCT: 25.3 % — ABNORMAL LOW (ref 39.0–52.0)
Hemoglobin: 8 g/dL — ABNORMAL LOW (ref 13.0–17.0)

## 2020-05-30 NOTE — Progress Notes (Signed)
PROGRESS NOTE    Phillip Klein  XYI:[id number] DOB: 10/27/1965 DOA: 04/28/2020 PCP: [name], MD    Brief Narrative: This 55 years old male with PMH significant for chronic hypoxic respiratory failure with tracheostomy and vent since 2021 after C-spine surgery, GERD, DM, anemia of chronic disease, quadriplegia, malnutrition, neurogenic orthostatic hypotension, untreated HCV, previous IV drug use, cirrhosis, chronic pain syndrome was sent from [facility] with septic shock. Patient admitted in the ICU with septic shock secondary to ESBL/ [name] bacteremia. Patient had Hickman's catheter which was removed, presumed to be the source of infection. Patient has completed meropenem for 10 days after removal of the catheter. Patient has developed acute kidney injury secondary to ischemic ATN from pressors requiring septic shock.,Cardiology,Nephrology is following. PCCM is assisting with vent management. Patient had multiple chronic bedsores,wound care is following. Palliative care consulted to discuss goals of care.  Patient eventually started on hemodialysis.  Patient is not a candidate for long-term outpatient dialysis because of poor functional status and multiple comorbidities.  Palliative care consulted and patient and family wants full scope of treatment.  Assessment & Plan:   Principal Problem:   MRSA Pneumonia Active Problems:   Septic shock ([place])   Pressure injury of skin   Malnutrition of moderate degree   Encounter for feeding tube placement   Hypotension   Quadriplegia (HCC)   Gram-negative bacteremia   Infection due to ESBL-producing [name] pneumoniae   Chronic hepatitis C without hepatic coma (HCC)   Anemia   Jejunostomy tube leak (HCC)   Pleural effusion   Sinus arrest   Pacemaker   HFrEF (heart failure with reduced ejection fraction) (HCC)   Severe malnutrition (HCC)   Anasarca   Acute renal failure superimposed on stage 3b chronic kidney  disease (HCC)   Septic shock, POA secondary to [name] bacteremia, UTI versus line infection  -Hickman catheter presumed to be the source of infection, removed. -Blood cultures + [name] bacteremia, UA positive, culture showed multiple species -Completed meropenem for 10 days on 05/12/2020, repeat blood cultures negative -PICC line inserted on 3/8, venous Doppler showed no DVT. -Sepsis physiology resolved.   Active problems Acute kidney injury secondary to ischemic ATN, septic shock requiring pressors, chronic hypotension, anasarca -Patient was placed on albumin and Lasix for anasarca, per nephrology has not been effective. -Nephrology following, not a candidate for long-term dialysis,  however may be an option for management of severe azotemia. -Palliative medicine also following.  Patient and family wants to have full scope of treatment. -05/22/20: Hemodialysis is planned -Patient underwent hemodialysis 3/20, 3/21, 3/22 and 3/24 with 2 L UF.  Hold onto further dialysis as per nephrology. -Bun has slightly improved.  Monitor for renal recovery. -Nephrologist has detailed discussion with the patient about hemodialysis.  Patient understood that getting dialysis 3 times a week will not going to improve his quality of life nor will improve some of his comorbidities.  His kidney functions remained stable.  We will continue to monitor for renal recovery. -If no improvement in renal functions, may need to readdress goals of care discussion.  Acute blood loss anemia with intermittent bleeding from wounds : -Platelet dysfunction in the setting of severe azotemia. -Nephrology recommended the conjugated estrogen (Premarin) via tube for 5 days, completed 3/20. -Hemoglobin again down to 6.7, received 2 units PRBC -Hemoglobin is 8 g/dL > 9.6 g/dl > 7.6 > 7.7> 7.8> 8.0  Essential hypertension Continue Coreg, hydralazine IV as needed with parameters. Increased coreg 12.5 bid as BP  been  elevated.  Acute on chronic combined systolic and diastolic CHF exacerbation with anasarca: -Other contributers include severe protein calorie malnutrition with hypoalbuminemia and third spacing -2D echo showed EF of 40 to 45% with global hypokinesis -Nephrology following, treated with albumin/furosemide, fluid restriction, considered short-term hemodialysis -Patient had hemodialysis on 3/20, 3/21, 3/22, 3/24.  Monitor for renal recovery.  Chronic ventilator dependent respiratory failure, quadriplegia -Management per CCM, continue ventilatory support with trach. -Interventional radiology team has been consulted to replace G tube. -Patient underwent tracheostomy tube change 3/21. -GJ tube changed 3/23. -Continue feeding through tube feed.  Orthostatic hypotension> Resolved. Florinef was discontinued in ICU due to concern for fluid retention, midodrine has been stopped. BP elevated, started coreg.  Chronic pain syndrome -Currently stable, continue fentanyl patch, oxycodone  Depression, insomnia Continue sertraline, trazodone, clonazepam  GERD Continue PPI  Hematuria -resolving, urology was consulted, no need for any acute intervention.  Hypothermia: > Resolved. -Patient was pancultured, cultures no growth so far. -Hold on antibiotics. He just completed meropenum x 10 days. -Continue warming blanket. -ID consulted, MRSA +, chest x-ray consistent with pneumonia -ID recommended Linezolid x 7 days.  Multiple pressure wounds, present on admission -Full-thickness vertebral column stage IV -Sacrum stage IV, -Right and left ischial tuberosity, stage IV -Right lateral hip, stage IV -Right and left lower leg lateral, unstageable -Right (unstageable now) and left posterior elbow, stage III - left posterior lower arm, unstageable   05/23/2020: Patient has been refusing wound care/dressing.  Obesity Estimated body mass index is 30.43 kg/m as calculated from the following:    Height as of this encounter: 6' 2" (1.88 m).   Weight as of this encounter: 107.5 kg.   DVT prophylaxis:  SCDs Code Status: Full code. Family Communication:  No family at bed side. Disposition Plan:  Status is: Inpatient  Remains inpatient appropriate because:Inpatient level of care appropriate due to severity of illness   Dispo: The patient is from: SNF              Anticipated d/c is to: LTAC              Patient currently is not medically stable to d/c.   Difficult to place patient Yes   Transfer back to [facility] or [facility] pending bed availability and cleared from nephrology. Patient had hemodialysis x4, hoping for renal recovery.  If no improvement in renal functions, readdress goals of care discussion.   Consultants:   PCCM  Cardiology  Nephrology  IR  Palliative care  Procedures: Tracheostomy change, GJ tube change  Antimicrobials: Anti-infectives (From admission, onward)   Start     Dose/Rate Route Frequency Ordered Stop   05/27/20 2000  linezolid (ZYVOX) 100 MG/5ML suspension 600 mg  Status:  Discontinued        600 mg Per Tube Every 12 hours 05/27/20 0838 05/27/20 1130   05/27/20 2000  linezolid (ZYVOX) tablet 600 mg        600 mg Per Tube Every 12 hours 05/27/20 1130 06/01/20 1959   05/25/20 1845  linezolid (ZYVOX) IVPB 600 mg  Status:  Discontinued        600 mg 300 mL/hr over 60 Minutes Intravenous Every 12 hours 05/25/20 1748 05/27/20 0838   05/19/20 1000  fluconazole (DIFLUCAN) tablet 50 mg       "Followed by" Linked Group Details   50 mg Oral Daily 05/18/20 1048 05/27/20 0929   05/18/20 1145  fluconazole (DIFLUCAN) tablet 100 mg       "  Followed by" Linked Group Details   100 mg Oral  Once 05/18/20 1048 05/18/20 1145   05/11/20 0800  meropenem (MERREM) 1 g in sodium chloride 0.9 % 100 mL IVPB        1 g 200 mL/hr over 30 Minutes Intravenous Every 8 hours 05/11/20 0707 05/12/20 1555   05/11/20 0715  meropenem (MERREM) 1 g in sodium chloride 0.9 %  100 mL IVPB  Status:  Discontinued        1 g 200 mL/hr over 30 Minutes Intravenous Every 8 hours 05/11/20 0706 05/11/20 0707   05/03/20 2200  meropenem (MERREM) 1 g in sodium chloride 0.9 % 100 mL IVPB  Status:  Discontinued        1 g 200 mL/hr over 30 Minutes Intravenous Every 12 hours 05/03/20 1041 05/11/20 0706   04/30/20 1145  meropenem (MERREM) 2 g in sodium chloride 0.9 % 100 mL IVPB  Status:  Discontinued        2 g 200 mL/hr over 30 Minutes Intravenous Every 12 hours 04/30/20 1052 05/03/20 1041   04/30/20 0530  ceFEPIme (MAXIPIME) 2 g in sodium chloride 0.9 % 100 mL IVPB  Status:  Discontinued        2 g 200 mL/hr over 30 Minutes Intravenous Every 12 hours 04/08/2020 1852 04/30/20 1051   04/21/2020 1852  vancomycin variable dose per unstable renal function (pharmacist dosing)  Status:  Discontinued         Does not apply See admin instructions 04/17/2020 1852 04/30/20 1051   04/23/2020 1630  vancomycin (VANCOREADY) IVPB 2000 mg/400 mL        2,000 mg 200 mL/hr over 120 Minutes Intravenous  Once 04/16/2020 1621 04/23/2020 2008   04/26/2020 1630  ceFEPIme (MAXIPIME) 2 g in sodium chloride 0.9 % 100 mL IVPB        2 g 200 mL/hr over 30 Minutes Intravenous  Once 04/21/2020 1621 04/23/2020 1749     Subjective: Patient was seen and examined at bedside.  Overnight events noted.   Patient still appears swollen with scrotal edema and generalized anasarca.   He reports feeling better, He communicates by mouthing words. He appears sad states he was explained, if no renal recovery,  we might need to readdress goals of care discussion.  Objective: Vitals:   05/30/20 0600 05/30/20 0754 05/30/20 0800 05/30/20 0900  BP: (!) 165/89  (!) 166/89 (!) 151/80  Pulse: 79  74 (!) 59  Resp: 19  (!) 22 18  Temp:  (!) 97.4 F (36.3 C)    TempSrc:  Axillary    SpO2: 99%  100% 99%  Weight:      Height:        Intake/Output Summary (Last 24 hours) at 05/30/2020 1024 Last data filed at 05/30/2020 0600 Gross per  24 hour  Intake 1436.03 ml  Output 2075 ml  Net -638.97 ml   Filed Weights   05/27/20 1315 05/27/20 1630 05/28/20 0500  Weight: 104.8 kg 103.9 kg 103.8 kg    Examination:  General exam: Appears calm and comfortable, not in any distress.  HEENT: Tracheostomy tube connected with ventilator, vent dependent. Respiratory system: Clear to auscultation. Respiratory effort normal. Cardiovascular system: S1 & S2 heard, RRR. No JVD, murmurs, rubs, gallops or clicks. +++ pedal edema. Gastrointestinal system: Abdomen is nondistended, soft and nontender. No organomegaly or masses felt.   Normal bowel sounds heard. Colostomy bag on left. Central nervous system: Alert and oriented x 2. No focal  neurological deficits. Extremities: Bilateral heal protectors, Pitting edema, Anasarca. Skin: Multiple pressure ulcers in multiple locations. Psychiatry: Judgement and insight appear normal. Mood & affect appropriate.     Data Reviewed: I have personally reviewed following labs and imaging studies  CBC: Recent Labs  Lab 05/25/20 0300 05/26/20 0430 05/27/20 0630 05/27/20 1040 05/28/20 0300 05/29/20 0300 05/30/20 0242  WBC 5.6 5.6  --  7.6  --   --   --   HGB 8.0* 8.0* 9.6* 7.6* 7.7* 7.8* 8.0*  HCT 25.7* 25.5* 30.9* 24.6* 25.1* 24.7* 25.3*  MCV 91.5 91.1  --  93.5  --   --   --   PLT 138* 121*  --  139*  --   --   --    Basic Metabolic Panel: Recent Labs  Lab 05/25/20 0300 05/26/20 0430 05/27/20 0630 05/28/20 0300 05/29/20 0300 05/30/20 0241 05/30/20 0242  NA 140 138 138 137 137 139 138  K 3.9 3.6 3.5 3.2* 3.8 4.1 4.0  CL 102 101 102 102 102 105 104  CO2 _0 GLUCOSE 86 92 84 119* 156* 155* 154*  BUN 191* 154* 157* 135* 148* 160* 154*  CREATININE 2.21* 2.07* 2.40* 2.11* 2.25* 2.27* 2.26*  CALCIUM 9.2 9.2 9.1 9.1 9.1 9.2 9.3  MG 2.0 1.9  --   --   --   --   --   PHOS 8.3* 7.6* 8.1* 7.0* 7.5* 7.3*  --    GFR: Estimated Creatinine Clearance: 49.1 mL/min (A) (by C-G  formula based on SCr of 2.26 mg/dL (H)). Liver Function Tests: Recent Labs  Lab 05/24/20 0500 05/25/20 0300 05/26/20 0430 05/27/20 0630 05/28/20 0300 05/29/20 0300 05/30/20 0241  AST _1 --   --   --   ALT _2 --   --   --   ALKPHOS 222* 241* 226* 214*  --   --   --   BILITOT 1.5* 1.3* 1.2 1.0  --   --   --   PROT 7.8 7.4 7.7 7.2  --   --   --   ALBUMIN 3.4* 3.1* 2.9* 2.6* 2.5* 2.5* 2.4*   No results for input(s): LIPASE, AMYLASE in the last 168 hours. No results for input(s): AMMONIA in the last 168 hours. Coagulation Profile: No results for input(s): INR, PROTIME in the last 168 hours. Cardiac Enzymes: No results for input(s): CKTOTAL, CKMB, CKMBINDEX, TROPONINI in the last 168 hours. BNP (last 3 results) No results for input(s): PROBNP in the last 8760 hours. HbA1C: No results for input(s): HGBA1C in the last 72 hours. CBG: Recent Labs  Lab 05/28/20 2335 05/29/20 0617 05/29/20 1212 05/29/20 1756 05/29/20 2335  GLUCAP 137* 142* 134* 140* 137*   Lipid Profile: No results for input(s): CHOL, HDL, LDLCALC, TRIG, CHOLHDL, LDLDIRECT in the last 72 hours. Thyroid Function Tests: No results for input(s): TSH, T4TOTAL, FREET4, T3FREE, THYROIDAB in the last 72 hours. Anemia Panel: No results for input(s): VITAMINB12, FOLATE, FERRITIN, TIBC, IRON, RETICCTPCT in the last 72 hours. Sepsis Labs: No results for input(s): PROCALCITON, LATICACIDVEN in the last 168 hours.  Recent Results (from the past 240 hour(s))  Culture, Urine     Status: Abnormal   Collection Time: 05/23/20  9:31 AM   Specimen: Urine, Random  Result Value Ref Range Status   Specimen Description URINE, RANDOM  Final   Special Requests   Final    NONE Performed  at [facility], [place] [address], [place], [place] [address]    Culture MULTIPLE SPECIES PRESENT, SUGGEST RECOLLECTION (A)  Final   Report Status 05/24/2020 FINAL  Final  Culture, blood (routine x 2)     Status: None    Collection Time: 05/23/20 10:02 AM   Specimen: BLOOD LEFT HAND  Result Value Ref Range Status   Specimen Description BLOOD LEFT HAND  Final   Special Requests   Final    BOTTLES DRAWN AEROBIC ONLY Blood Culture results may not be optimal due to an inadequate volume of blood received in culture bottles   Culture   Final    NO GROWTH 5 DAYS Performed at [facility], [place] [address], [place], [place] [address]    Report Status 05/28/2020 FINAL  Final  Culture, blood (routine x 2)     Status: None   Collection Time: 05/23/20 10:17 AM   Specimen: BLOOD LEFT HAND  Result Value Ref Range Status   Specimen Description BLOOD LEFT HAND  Final   Special Requests   Final    BOTTLES DRAWN AEROBIC ONLY Blood Culture results may not be optimal due to an inadequate volume of blood received in culture bottles   Culture   Final    NO GROWTH 5 DAYS Performed at [facility], [place] [address] [place], [place] [address]    Report Status 05/28/2020 FINAL  Final  Culture, Respiratory w Gram Stain     Status: None   Collection Time: 05/23/20  5:12 PM   Specimen: Tracheal Aspirate; Respiratory  Result Value Ref Range Status   Specimen Description TRACHEAL ASPIRATE  Final   Special Requests NONE  Final   Gram Stain   Final    MODERATE WBC PRESENT, PREDOMINANTLY PMN ABUNDANT GRAM POSITIVE COCCI IN PAIRS IN CHAINS Performed at [facility], [place] [address], [place],  [address]    Culture   Final    MODERATE METHICILLIN RESISTANT STAPHYLOCOCCUS AUREUS   Report Status 05/25/2020 FINAL  Final   Organism ID, Bacteria METHICILLIN RESISTANT STAPHYLOCOCCUS AUREUS  Final      Susceptibility   Methicillin resistant staphylococcus aureus - MIC*    CIPROFLOXACIN >=8 RESISTANT Resistant     ERYTHROMYCIN >=8 RESISTANT Resistant     GENTAMICIN <=0.5 SENSITIVE Sensitive     OXACILLIN >=4 RESISTANT Resistant     TETRACYCLINE <=1 SENSITIVE Sensitive     VANCOMYCIN <=0.5 SENSITIVE Sensitive      TRIMETH/SULFA <=10 SENSITIVE Sensitive     CLINDAMYCIN >=8 RESISTANT Resistant     RIFAMPIN <=0.5 SENSITIVE Sensitive     Inducible Clindamycin NEGATIVE Sensitive     * MODERATE METHICILLIN RESISTANT STAPHYLOCOCCUS AUREUS    Radiology Studies: No results found. Scheduled Meds: . carvedilol  12.5 mg Per Tube BID WC  . chlorhexidine gluconate (MEDLINE KIT)  15 mL Mouth Rinse BID  . Chlorhexidine Gluconate Cloth  6 each Topical Daily  . Chlorhexidine Gluconate Cloth  6 each Topical Q0600  . Chlorhexidine Gluconate Cloth  6 each Topical Q0600  . clonazePAM  0.5 mg Per Tube BID  . darbepoetin (ARANESP) injection - DIALYSIS  60 mcg Intravenous Q Mon-HD  . feeding supplement (PROSource TF)  45 mL Per Tube 5 X Daily  . fentaNYL  1 patch Transdermal Q72H   And  . fentaNYL  1 patch Transdermal Q72H  . levothyroxine  125 mcg Per Tube Q0600  . linezolid  600 mg Per  Tube Q12H  . mouth rinse  15 mL Mouth Rinse 10 times per day  . midazolam  2 mg Intravenous Once  . multivitamin with minerals  1 tablet Per Tube Daily  . nutrition supplement (JUVEN)  1 packet Per Tube BID BM  . pantoprazole sodium  40 mg Per Tube Daily  . sodium chloride flush  10-40 mL Intracatheter Q12H   Continuous Infusions: . sodium chloride 10 mL/hr at 05/28/20 1100  . feeding supplement (OSMOLITE 1.5 CAL) 1,000 mL (05/30/20 0548)     LOS: 31 days    Time spent: 35 mins    PARDEEP KUMAR, MD [facility]   If 7PM-7AM, please contact night-coverage

## 2020-05-30 NOTE — Progress Notes (Signed)
[facility] NEPHROLOGY PROGRESS NOTE  Assessment/ Plan:  # Acute kidney Injury: Secondary to ischemic ATN from pressor requiring septic shock (with background history of chronic hypotension). Started dialysis for fluid overload refractory to IV diuretics and azotemia on 3/20. Temporary catheter placed with IR on 3/19 As per patient's [facility], the patient has had dialysis in the past and then the kidney function improved.  She wants to try this time as well.  She is aware that patient is not a candidate for long-term outpatient dialysis because of very poor functional status and multiple comorbidities. I believe dialysis/renal replacement therapy will not improve his quality of life -I do believe his Cr is overestimated due to lack of muscle mass, kidney function likely lower/worse - Will monitor for recovery from now on. If no improvement, then will need to re-engage Phillip Klein and family in regards to next step given lack of candidacy for long term HD as above. Fortunately, today his kidney function is stable and is having more urine output which is reassuring. Possible plateau'ing? Would favor monitoring for now and hope for further recovery -Phillip Klein and I discussed the plans on holding dialysis for now and monitoring for renal recovery (conversation on 3/25 and 3/26). I did explain to him that doing dialysis and potentially tethering him to a procedure three times a week will not improve his overall quality of life, nor will it improve some of his comorbidities. I did explain to him that if it comes to it, then keeping him comfortable may just be in his best interest given everything that he has been through. The entire time, patient did maintain eye contact and nodded his head in agreement and mouthed 'yes.' The reason I had this conversation with him is because apparently (per palliative's consultation), his wife Phillip Klein does believe that the patient has the capacity for his own  decision making (note 3/24).  # Anemia with intermittent bleeding from wounds: transfuse prn  # Sepsis secondary to [name] bacteremia: abx per primary service  # Severe protein calorie malnutrition: Ongoing supplemental nutrition/tube feeds.  # History of diastolic heart failure/anasarca: He has significant anasarca in part from his protein calorie malnutrition/low albumin and associated third spacing with preceding use of Florinef.  UF with HD. Consider lasix with albumin if urine output goes down or has worsening volume status   # Chronic ventilator dependent respiratory failure, quadriplegia and multiple pressure wounds/ulcers: With ongoing management for chronic pain/blood loss anemia and awaiting transfer to LTAC.  Subjective: Seen and examined in ICU. No acute events. Urine output up 0.9L. Nausea is about the same. No other complaints.   Objective Vital signs in last 24 hours: Vitals:   05/30/20 0400 05/30/20 0500 05/30/20 0600 05/30/20 0754  BP: (!) 165/85 (!) 165/93 (!) 165/89   Pulse: 76 76 79   Resp: _0 Temp:    (!) 97.4 F (36.3 C)  TempSrc:    Axillary  SpO2: 100% 100% 99%   Weight:      Height:       Weight change:   Intake/Output Summary (Last 24 hours) at 05/30/2020 0859 Last data filed at 05/30/2020 0600 Gross per 24 hour  Intake 1636.03 ml  Output 2075 ml  Net -438.97 ml       Labs: Basic Metabolic Panel: Recent Labs  Lab 05/28/20 0300 05/29/20 0300 05/30/20 0241 05/30/20 0242  NA 137 137 139 138  K 3.2* 3.8 4.1 4.0  CL  102 102 105 104  CO2 _0 GLUCOSE 119* 156* 155* 154*  BUN 135* 148* 160* 154*  CREATININE 2.11* 2.25* 2.27* 2.26*  CALCIUM 9.1 9.1 9.2 9.3  PHOS 7.0* 7.5* 7.3*  --    Liver Function Tests: Recent Labs  Lab 05/25/20 0300 05/26/20 0430 05/27/20 0630 05/28/20 0300 05/29/20 0300 05/30/20 0241  AST _1 --   --   --   ALT _2 --   --   --   ALKPHOS 241* 226* 214*  --   --   --   BILITOT  1.3* 1.2 1.0  --   --   --   PROT 7.4 7.7 7.2  --   --   --   ALBUMIN 3.1* 2.9* 2.6* 2.5* 2.5* 2.4*   No results for input(s): LIPASE, AMYLASE in the last 168 hours. No results for input(s): AMMONIA in the last 168 hours. CBC: Recent Labs  Lab 05/25/20 0300 05/26/20 0430 05/27/20 0630 05/27/20 1040 05/28/20 0300 05/29/20 0300 05/30/20 0242  WBC 5.6 5.6  --  7.6  --   --   --   HGB 8.0* 8.0*   < > 7.6* 7.7* 7.8* 8.0*  HCT 25.7* 25.5*   < > 24.6* 25.1* 24.7* 25.3*  MCV 91.5 91.1  --  93.5  --   --   --   PLT 138* 121*  --  139*  --   --   --    < > = values in this interval not displayed.   Cardiac Enzymes: No results for input(s): CKTOTAL, CKMB, CKMBINDEX, TROPONINI in the last 168 hours. CBG: Recent Labs  Lab 05/28/20 2335 05/29/20 0617 05/29/20 1212 05/29/20 1756 05/29/20 2335  GLUCAP 137* 142* 134* 140* 137*    Iron Studies: No results for input(s): IRON, TIBC, TRANSFERRIN, FERRITIN in the last 72 hours. Studies/Results: No results found.  Medications: Infusions: . sodium chloride 10 mL/hr at 05/28/20 1100  . feeding supplement (OSMOLITE 1.5 CAL) 1,000 mL (05/30/20 0548)    Scheduled Medications: . carvedilol  12.5 mg Per Tube BID WC  . chlorhexidine gluconate (MEDLINE KIT)  15 mL Mouth Rinse BID  . Chlorhexidine Gluconate Cloth  6 each Topical Daily  . Chlorhexidine Gluconate Cloth  6 each Topical Q0600  . Chlorhexidine Gluconate Cloth  6 each Topical Q0600  . clonazePAM  0.5 mg Per Tube BID  . darbepoetin (ARANESP) injection - DIALYSIS  60 mcg Intravenous Q Mon-HD  . feeding supplement (PROSource TF)  45 mL Per Tube 5 X Daily  . fentaNYL  1 patch Transdermal Q72H   And  . fentaNYL  1 patch Transdermal Q72H  . levothyroxine  125 mcg Per Tube Q0600  . linezolid  600 mg Per Tube Q12H  . mouth rinse  15 mL Mouth Rinse 10 times per day  . midazolam  2 mg Intravenous Once  . multivitamin with minerals  1 tablet Per Tube Daily  . nutrition supplement  (JUVEN)  1 packet Per Tube BID BM  . pantoprazole sodium  40 mg Per Tube Daily  . sodium chloride flush  10-40 mL Intracatheter Q12H    have reviewed scheduled and prn medications.  Physical Exam: General: Elderly male, ill looking sitting up in bed.  On vent via tracheostomy Heart:RRR, s1s2 nl Lungs: unlabored, +trach Abdomen:soft, Non-tender, distended Extremities: anasarca present Neurology: alert awake, interactive (mouths words), follows commands Dialysis Access: Right IJ temporary HD catheter  site clean.  Phillip Klein 05/30/2020,8:59 AM  LOS: 31 days

## 2020-05-31 DIAGNOSIS — A4159 Other Gram-negative sepsis: Secondary | ICD-10-CM | POA: Diagnosis not present

## 2020-05-31 DIAGNOSIS — A4902 Methicillin resistant Staphylococcus aureus infection, unspecified site: Secondary | ICD-10-CM | POA: Diagnosis not present

## 2020-05-31 DIAGNOSIS — I959 Hypotension, unspecified: Secondary | ICD-10-CM | POA: Diagnosis not present

## 2020-05-31 LAB — GLUCOSE, CAPILLARY
Glucose-Capillary: 128 mg/dL — ABNORMAL HIGH (ref 70–99)
Glucose-Capillary: 113 mg/dL — ABNORMAL HIGH (ref 70–99)
Glucose-Capillary: 106 mg/dL — ABNORMAL HIGH (ref 70–99)
Glucose-Capillary: 100 mg/dL — ABNORMAL HIGH (ref 70–99)

## 2020-05-31 LAB — MAGNESIUM: Magnesium: 2 mg/dL (ref 1.7–2.4)

## 2020-05-31 LAB — HEMOGLOBIN AND HEMATOCRIT, BLOOD
HCT: 24.9 % — ABNORMAL LOW (ref 39.0–52.0)
Hemoglobin: 7.5 g/dL — ABNORMAL LOW (ref 13.0–17.0)

## 2020-05-31 LAB — RENAL FUNCTION PANEL
Albumin: 2.3 g/dL — ABNORMAL LOW (ref 3.5–5.0)
Anion gap: 10 (ref 5–15)
BUN: 166 mg/dL — ABNORMAL HIGH (ref 6–20)
CO2: 25 mmol/L (ref 22–32)
Calcium: 9.4 mg/dL (ref 8.9–10.3)
Chloride: 105 mmol/L (ref 98–111)
Creatinine, Ser: 2.33 mg/dL — ABNORMAL HIGH (ref 0.61–1.24)
GFR, Estimated: 33 mL/min — ABNORMAL LOW (ref >60)
Glucose, Bld: 141 mg/dL — ABNORMAL HIGH (ref 70–99)
Phosphorus: 6.8 mg/dL — ABNORMAL HIGH (ref 2.5–4.6)
Potassium: 4.6 mmol/L (ref 3.5–5.1)
Sodium: 140 mmol/L (ref 135–145)

## 2020-05-31 MED ORDER — DARBEPOETIN ALFA 60 MCG/0.3ML IJ SOSY
60.0000 ug | PREFILLED_SYRINGE | INTRAMUSCULAR | Status: DC
  Administered 2020-05-31: 60 ug via SUBCUTANEOUS
  Filled 2020-05-31: qty 0.3

## 2020-05-31 NOTE — Progress Notes (Addendum)
PROGRESS NOTE    Phillip Klein  OYO:[id number] DOB: 15-Jun-1965 DOA: 04/22/2020 PCP: Corine Shelter, MD    Brief Narrative: This 55 years old male with PMH significant for chronic hypoxic respiratory failure with tracheostomy and vent since 2021 after C-spine surgery, GERD, DM, anemia of chronic disease, quadriplegia, malnutrition, neurogenic orthostatic hypotension, untreated HCV, previous IV drug use, cirrhosis, chronic pain syndrome was sent from [facility] with septic shock. Patient admitted in the ICU with septic shock secondary to ESBL/ [name] bacteremia. Patient had Hickman's catheter which was removed, presumed to be the source of infection. Patient has completed meropenem for 10 days after removal of the catheter. Patient has developed acute kidney injury secondary to ischemic ATN from pressors requiring septic shock.,Cardiology,Nephrology is following. PCCM is assisting with vent management. Patient had multiple chronic bedsores,wound care is following. Palliative care consulted to discuss goals of care.  Patient eventually started on hemodialysis.  Patient is not a candidate for long-term outpatient dialysis because of poor functional status and multiple comorbidities.  Palliative care consulted and patient and family wants full scope of treatment.  Assessment & Plan:   Principal Problem:   MRSA Pneumonia Active Problems:   Septic shock (HCC)   Pressure injury of skin   Malnutrition of moderate degree   Encounter for feeding tube placement   Hypotension   Quadriplegia (HCC)   Gram-negative bacteremia   Infection due to ESBL-producing [name] pneumoniae   Chronic hepatitis C without hepatic coma (HCC)   Anemia   Jejunostomy tube leak (HCC)   Pleural effusion   Sinus arrest   Pacemaker   HFrEF (heart failure with reduced ejection fraction) (HCC)   Severe malnutrition (HCC)   Anasarca   Acute renal failure superimposed on stage 3b chronic kidney  disease (HCC)   Septic shock, POA secondary to [name] bacteremia, UTI versus line infection  -Hickman catheter presumed to be the source of infection, removed. -Blood cultures + [name] bacteremia, UA positive, culture showed multiple species -Completed meropenem for 10 days on 05/12/2020, repeat blood cultures negative -PICC line inserted on 3/8, venous Doppler showed no DVT. -Sepsis physiology resolved.   Active problems Acute kidney injury sec. to ischemic ATN, septic shock requiring pressors, chronic hypotension, anasarca -Patient was placed on albumin and Lasix for anasarca, per nephrology has not been effective. -Nephrology following, not a candidate for long-term dialysis,  however may be an option for management of severe azotemia. -Palliative medicine also following.  Patient and family wants to have full scope of treatment. -05/22/20: Hemodialysis is planned -Patient underwent hemodialysis 3/20, 3/21, 3/22 and 3/24 with 2 L UF.  Hold onto further dialysis as per nephrology. -Monitor for renal recovery. BUN is trending up. -Nephrologist had detailed discussion with the patient about hemodialysis.  Patient understood that getting dialysis 3 times a week will not going to improve his quality of life nor will improve some of his comorbidities. His kidney functions remained stable.  We will continue to monitor for renal recovery. -If no improvement in renal functions, may need to readdress goals of care discussion.  Acute blood loss anemia with intermittent bleeding from wounds : -Platelet dysfunction in the setting of severe azotemia. -Nephrology recommended the conjugated estrogen (Premarin) via tube for 5 days, completed 3/20. -Hemoglobin again down to 6.7, received 2 units PRBC -Hemoglobin is 8 g/dL > 9.6 g/dl > 7.6 > 0.4> 5.9> 1.3> 7.5  Essential hypertension Continue Coreg, hydralazine IV as needed with parameters. Increased coreg 12.5 bid as BP been  elevated.  Acute  on chronic combined systolic and diastolic CHF exacerbation with anasarca: -Other contributers include severe protein calorie malnutrition with hypoalbuminemia and third spacing -2D echo showed EF of 40 to 45% with global hypokinesis -Nephrology following, treated with albumin/furosemide, fluid restriction, considered short-term hemodialysis -Patient had hemodialysis on 3/20, 3/21, 3/22, 3/24.  Monitor for renal recovery.  Chronic ventilator dependent respiratory failure, quadriplegia -Management per CCM, continue ventilatory support with trach. -Interventional radiology team has been consulted to replace G tube. -Patient underwent tracheostomy tube change 3/21. -GJ tube changed 3/23. -Continue feeding through tube feed.  Orthostatic hypotension> Resolved. Florinef was discontinued in ICU due to concern for fluid retention, midodrine has been stopped. BP elevated, started coreg.  Chronic pain syndrome -Currently stable, continue fentanyl patch, oxycodone  Depression, insomnia Continue sertraline, trazodone, clonazepam  GERD Continue PPI  Hematuria -resolving, urology was consulted, no need for any acute intervention.  Hypothermia:  -Patient was pancultured, cultures no growth so far. -Hold on antibiotics. He just completed meropenum x 10 days. -Continue warming blanket. -ID consulted, MRSA +, chest x-ray consistent with pneumonia -ID recommended Linezolid  X 7 days.  Multiple pressure wounds, present on admission -Full-thickness vertebral column stage IV -Sacrum stage IV, -Right and left ischial tuberosity, stage IV -Right lateral hip, stage IV -Right and left lower leg lateral, unstageable -Right (unstageable now) and left posterior elbow, stage III - left posterior lower arm, unstageable   05/23/2020: Patient has been refusing wound care/dressing.  Obesity Estimated body mass index is 30.43 kg/m as calculated from the following:   Height as of this encounter:  _0  (1.88 m).   Weight as of this encounter: 107.5 kg.   DVT prophylaxis:  SCDs Code Status: Full code. Family Communication:  No family at bed side. Disposition Plan:  Status is: Inpatient  Remains inpatient appropriate because:Inpatient level of care appropriate due to severity of illness   Dispo: The patient is from: SNF              Anticipated d/c is to: LTAC              Patient currently is not medically stable to d/c.   Difficult to place patient Yes   Transfer back to [facility] pending bed availability and cleared from nephrology. Patient had hemodialysis x4, hoping for renal recovery.  If no improvement in renal functions, readdress goals of care discussion.   Consultants:   PCCM  Cardiology  Nephrology  IR  Palliative care  Procedures: Tracheostomy change, GJ tube change, RIJ central line.   Antimicrobials: Anti-infectives (From admission, onward)   Start     Dose/Rate Route Frequency Ordered Stop   05/27/20 2000  linezolid (ZYVOX) 100 MG/5ML suspension 600 mg  Status:  Discontinued        600 mg Per Tube Every 12 hours 05/27/20 0838 05/27/20 1130   05/27/20 2000  linezolid (ZYVOX) tablet 600 mg        600 mg Per Tube Every 12 hours 05/27/20 1130 06/01/20 1959   05/25/20 1845  linezolid (ZYVOX) IVPB 600 mg  Status:  Discontinued        600 mg 300 mL/hr over 60 Minutes Intravenous Every 12 hours 05/25/20 1748 05/27/20 0838   05/19/20 1000  fluconazole (DIFLUCAN) tablet 50 mg       "Followed by" Linked Group Details   50 mg Oral Daily 05/18/20 1048 05/27/20 0929   05/18/20 1145  fluconazole (DIFLUCAN) tablet 100 mg       "  Followed by" Linked Group Details   100 mg Oral  Once 05/18/20 1048 05/18/20 1145   05/11/20 0800  meropenem (MERREM) 1 g in sodium chloride 0.9 % 100 mL IVPB        1 g 200 mL/hr over 30 Minutes Intravenous Every 8 hours 05/11/20 0707 05/12/20 1555   05/11/20 0715  meropenem (MERREM) 1 g in sodium chloride 0.9 % 100 mL IVPB   Status:  Discontinued        1 g 200 mL/hr over 30 Minutes Intravenous Every 8 hours 05/11/20 0706 05/11/20 0707   05/03/20 2200  meropenem (MERREM) 1 g in sodium chloride 0.9 % 100 mL IVPB  Status:  Discontinued        1 g 200 mL/hr over 30 Minutes Intravenous Every 12 hours 05/03/20 1041 05/11/20 0706   04/30/20 1145  meropenem (MERREM) 2 g in sodium chloride 0.9 % 100 mL IVPB  Status:  Discontinued        2 g 200 mL/hr over 30 Minutes Intravenous Every 12 hours 04/30/20 1052 05/03/20 1041   04/30/20 0530  ceFEPIme (MAXIPIME) 2 g in sodium chloride 0.9 % 100 mL IVPB  Status:  Discontinued        2 g 200 mL/hr over 30 Minutes Intravenous Every 12 hours 04/27/2020 1852 04/30/20 1051   04/22/2020 1852  vancomycin variable dose per unstable renal function (pharmacist dosing)  Status:  Discontinued         Does not apply See admin instructions 05/01/2020 1852 04/30/20 1051   04/14/2020 1630  vancomycin (VANCOREADY) IVPB 2000 mg/400 mL        2,000 mg 200 mL/hr over 120 Minutes Intravenous  Once 04/25/2020 1621 04/30/2020 2008   04/12/2020 1630  ceFEPIme (MAXIPIME) 2 g in sodium chloride 0.9 % 100 mL IVPB        2 g 200 mL/hr over 30 Minutes Intravenous  Once 04/21/2020 1621 05/03/2020 1749     Subjective: Patient was seen and examined at bedside.  Overnight events noted.   Patient still appears swollen with scrotal edema and generalized anasarca.   He communicates by mouthing words, difficult to understand sometimes,  He appears sad states he was explained, if no renal recovery,  we might need to readdress goals of care discussion.  Objective: Vitals:   05/31/20 0500 05/31/20 0600 05/31/20 0712 05/31/20 1136  BP: (!) 157/88 (!) 165/87  122/62  Pulse: 78 83 80 76  Resp: _0 Temp:      TempSrc:      SpO2: 99% 99% 99%   Weight:      Height:        Intake/Output Summary (Last 24 hours) at 05/31/2020 1136 Last data filed at 05/31/2020 0400 Gross per 24 hour  Intake 1670 ml  Output 1260 ml   Net 410 ml   Filed Weights   05/27/20 1315 05/27/20 1630 05/28/20 0500  Weight: 104.8 kg 103.9 kg 103.8 kg    Examination:  General exam: Appears calm and comfortable, not in any distress.  HEENT: Tracheostomy tube connected with ventilator, vent dependent. RIJ central line, no signs of infection. Respiratory system: Clear to auscultation. Respiratory effort normal. Cardiovascular system: S1 & S2 heard, RRR. No JVD, murmurs, rubs, gallops or clicks. +++ pedal edema. Gastrointestinal system: Abdomen is nondistended, soft and nontender. No organomegaly or masses felt.   Normal bowel sounds heard. Colostomy bag on left. Central nervous system: Alert and oriented x 3. No  focal neurological deficits. Extremities: Bilateral heal protectors, Pitting edema, Anasarca. Skin: Multiple pressure ulcers in multiple locations. Psychiatry: Judgement and insight appear normal. Mood & affect appropriate.     Data Reviewed: I have personally reviewed following labs and imaging studies  CBC: Recent Labs  Lab 05/25/20 0300 05/26/20 0430 05/27/20 0630 05/27/20 1040 05/28/20 0300 05/29/20 0300 05/30/20 0242 05/31/20 0530  WBC 5.6 5.6  --  7.6  --   --   --   --   HGB 8.0* 8.0*   < > 7.6* 7.7* 7.8* 8.0* 7.5*  HCT 25.7* 25.5*   < > 24.6* 25.1* 24.7* 25.3* 24.9*  MCV 91.5 91.1  --  93.5  --   --   --   --   PLT 138* 121*  --  139*  --   --   --   --    < > = values in this interval not displayed.   Basic Metabolic Panel: Recent Labs  Lab 05/25/20 0300 05/26/20 0430 05/27/20 0630 05/28/20 0300 05/29/20 0300 05/30/20 0241 05/30/20 0242 05/31/20 0530  NA 140 138 138 137 137 139 138 140  K 3.9 3.6 3.5 3.2* 3.8 4.1 4.0 4.6  CL 102 101 102 102 102 105 104 105  CO2 _0 GLUCOSE 86 92 84 119* 156* 155* 154* 141*  BUN 191* 154* 157* 135* 148* 160* 154* 166*  CREATININE 2.21* 2.07* 2.40* 2.11* 2.25* 2.27* 2.26* 2.33*  CALCIUM 9.2 9.2 9.1 9.1 9.1 9.2 9.3 9.4  MG 2.0 1.9   --   --   --   --   --  2.0  PHOS 8.3* 7.6* 8.1* 7.0* 7.5* 7.3*  --  6.8*   GFR: Estimated Creatinine Clearance: 47.6 mL/min (A) (by C-G formula based on SCr of 2.33 mg/dL (H)). Liver Function Tests: Recent Labs  Lab 05/25/20 0300 05/26/20 0430 05/27/20 0630 05/28/20 0300 05/29/20 0300 05/30/20 0241 05/31/20 0530  AST _1 --   --   --   --   ALT _2 --   --   --   --   ALKPHOS 241* 226* 214*  --   --   --   --   BILITOT 1.3* 1.2 1.0  --   --   --   --   PROT 7.4 7.7 7.2  --   --   --   --   ALBUMIN 3.1* 2.9* 2.6* 2.5* 2.5* 2.4* 2.3*   No results for input(s): LIPASE, AMYLASE in the last 168 hours. No results for input(s): AMMONIA in the last 168 hours. Coagulation Profile: No results for input(s): INR, PROTIME in the last 168 hours. Cardiac Enzymes: No results for input(s): CKTOTAL, CKMB, CKMBINDEX, TROPONINI in the last 168 hours. BNP (last 3 results) No results for input(s): PROBNP in the last 8760 hours. HbA1C: No results for input(s): HGBA1C in the last 72 hours. CBG: Recent Labs  Lab 05/30/20 1137 05/30/20 1623 05/30/20 2330 05/31/20 0533 05/31/20 1042  GLUCAP 147* 126* 134* 128* 113*   Lipid Profile: No results for input(s): CHOL, HDL, LDLCALC, TRIG, CHOLHDL, LDLDIRECT in the last 72 hours. Thyroid Function Tests: No results for input(s): TSH, T4TOTAL, FREET4, T3FREE, THYROIDAB in the last 72 hours. Anemia Panel: No results for input(s): VITAMINB12, FOLATE, FERRITIN, TIBC, IRON, RETICCTPCT in the last 72 hours. Sepsis Labs: No results for input(s): PROCALCITON, LATICACIDVEN in the last 168 hours.  Recent Results (  from the past 240 hour(s))  Culture, Urine     Status: Abnormal   Collection Time: 05/23/20  9:31 AM   Specimen: Urine, Random  Result Value Ref Range Status   Specimen Description URINE, RANDOM  Final   Special Requests   Final    NONE Performed at [facility], [address] [place], [place] [address]    Culture  MULTIPLE SPECIES PRESENT, SUGGEST RECOLLECTION (A)  Final   Report Status 05/24/2020 FINAL  Final  Culture, blood (routine x 2)     Status: None   Collection Time: 05/23/20 10:02 AM   Specimen: BLOOD LEFT HAND  Result Value Ref Range Status   Specimen Description BLOOD LEFT HAND  Final   Special Requests   Final    BOTTLES DRAWN AEROBIC ONLY Blood Culture results may not be optimal due to an inadequate volume of blood received in culture bottles   Culture   Final    NO GROWTH 5 DAYS Performed at [facility], [place] [address] [place], [place] [address]    Report Status 05/28/2020 FINAL  Final  Culture, blood (routine x 2)     Status: None   Collection Time: 05/23/20 10:17 AM   Specimen: BLOOD LEFT HAND  Result Value Ref Range Status   Specimen Description BLOOD LEFT HAND  Final   Special Requests   Final    BOTTLES DRAWN AEROBIC ONLY Blood Culture results may not be optimal due to an inadequate volume of blood received in culture bottles   Culture   Final    NO GROWTH 5 DAYS Performed at [facility], [place] [address] [place], [place] [address]    Report Status 05/28/2020 FINAL  Final  Culture, Respiratory w Gram Stain     Status: None   Collection Time: 05/23/20  5:12 PM   Specimen: Tracheal Aspirate; Respiratory  Result Value Ref Range Status   Specimen Description TRACHEAL ASPIRATE  Final   Special Requests NONE  Final   Gram Stain   Final    MODERATE WBC PRESENT, PREDOMINANTLY PMN ABUNDANT GRAM POSITIVE COCCI IN PAIRS IN CHAINS Performed at [facility], [place] [address], [place], [place] [address]    Culture   Final    MODERATE METHICILLIN RESISTANT STAPHYLOCOCCUS AUREUS   Report Status 05/25/2020 FINAL  Final   Organism ID, Bacteria METHICILLIN RESISTANT STAPHYLOCOCCUS AUREUS  Final      Susceptibility   Methicillin resistant staphylococcus aureus - MIC*    CIPROFLOXACIN >=8 RESISTANT Resistant     ERYTHROMYCIN >=8 RESISTANT Resistant     GENTAMICIN  <=0.5 SENSITIVE Sensitive     OXACILLIN >=4 RESISTANT Resistant     TETRACYCLINE <=1 SENSITIVE Sensitive     VANCOMYCIN <=0.5 SENSITIVE Sensitive     TRIMETH/SULFA <=10 SENSITIVE Sensitive     CLINDAMYCIN >=8 RESISTANT Resistant     RIFAMPIN <=0.5 SENSITIVE Sensitive     Inducible Clindamycin NEGATIVE Sensitive     * MODERATE METHICILLIN RESISTANT STAPHYLOCOCCUS AUREUS    Radiology Studies: No results found. Scheduled Meds: . carvedilol  12.5 mg Per Tube BID WC  . chlorhexidine gluconate (MEDLINE KIT)  15 mL Mouth Rinse BID  . Chlorhexidine Gluconate Cloth  6 each Topical Daily  . Chlorhexidine Gluconate Cloth  6 each Topical Q0600  . Chlorhexidine Gluconate Cloth  6 each Topical Q0600  . clonazePAM  0.5 mg Per Tube BID  . darbepoetin (ARANESP) injection - DIALYSIS  60  mcg Intravenous Q Mon-HD  . feeding supplement (PROSource TF)  45 mL Per Tube 5 X Daily  . fentaNYL  1 patch Transdermal Q72H   And  . fentaNYL  1 patch Transdermal Q72H  . levothyroxine  125 mcg Per Tube Q0600  . linezolid  600 mg Per Tube Q12H  . mouth rinse  15 mL Mouth Rinse 10 times per day  . midazolam  2 mg Intravenous Once  . multivitamin with minerals  1 tablet Per Tube Daily  . nutrition supplement (JUVEN)  1 packet Per Tube BID BM  . pantoprazole sodium  40 mg Per Tube Daily  . sodium chloride flush  10-40 mL Intracatheter Q12H   Continuous Infusions: . sodium chloride 10 mL/hr at 05/28/20 1100  . feeding supplement (OSMOLITE 1.5 CAL) 65 mL/hr at 05/31/20 0400     LOS: 32 days    Time spent: 35 mins    Khalilah Hoke, MD [facility]   If 7PM-7AM, please contact night-coverage

## 2020-05-31 NOTE — Progress Notes (Signed)
[facility] NEPHROLOGY PROGRESS NOTE  Assessment/ Plan:  # Acute kidney Injury: Secondary to ischemic ATN from pressor requiring septic shock (with background history of chronic hypotension).   Started dialysis for fluid overload refractory to IV diuretics and azotemia on 3/20. Temporary catheter placed with IR on 3/19 As per patient's [facility], the patient has had dialysis in the past and then the kidney function improved.  She wants to try this time as well.  She is aware that patient is not a candidate for long-term outpatient dialysis because of very poor functional status and multiple comorbidities. I believe dialysis/renal replacement therapy will not improve his quality of life -I do believe his Cr is overestimated due to lack of muscle mass, kidney function likely lower/worse - Will monitor for recovery from now on. If no improvement, then will need to re-engage Mr. Pertuit and family in regards to next step given lack of candidacy for long term HD as above. Fortunately, today his kidney function is stable and is having decent urine output which is reassuring. Possible plateau'ing? Would favor monitoring for now and hope for further recovery.  His BUN remains quite high but improved from ~250 to 150s now compared to earlier in the hospitalization.   # Anemia with intermittent bleeding from wounds: transfuse prn  # Sepsis secondary to [name] bacteremia: abx per primary service  # Severe protein calorie malnutrition: Ongoing supplemental nutrition/tube feeds.  # History of diastolic heart failure/anasarca: He has significant anasarca in part from his protein calorie malnutrition/low albumin and associated third spacing with preceding use of Florinef.   Consider lasix with albumin if urine output goes down or has worsening volume status but as this is extravascular and BUN so high I'm reluctant to use at this time.   # Chronic ventilator dependent respiratory failure,  quadriplegia and multiple pressure wounds/ulcers: With ongoing management for chronic pain/blood loss anemia and awaiting transfer to LTAC.  Subjective: Seen and examined in ICU. No acute events. Urine output up 0.66L from 0.9L.  Denies nausea. No other complaints.  Objective Vital signs in last 24 hours: Vitals:   05/31/20 0336 05/31/20 0500 05/31/20 0600 05/31/20 0712  BP:  (!) 157/88 (!) 165/87   Pulse: 81 78 83 80  Resp: _0 Temp:      TempSrc:      SpO2: 99% 99% 99% 99%  Weight:      Height:       Weight change:   Intake/Output Summary (Last 24 hours) at 05/31/2020 0929 Last data filed at 05/31/2020 0400 Gross per 24 hour  Intake 1670 ml  Output 1260 ml  Net 410 ml       Labs: Basic Metabolic Panel: Recent Labs  Lab 05/29/20 0300 05/30/20 0241 05/30/20 0242 05/31/20 0530  NA 137 139 138 140  K 3.8 4.1 4.0 4.6  CL 102 105 104 105  CO2 _1 GLUCOSE 156* 155* 154* 141*  BUN 148* 160* 154* 166*  CREATININE 2.25* 2.27* 2.26* 2.33*  CALCIUM 9.1 9.2 9.3 9.4  PHOS 7.5* 7.3*  --  6.8*   Liver Function Tests: Recent Labs  Lab 05/25/20 0300 05/26/20 0430 05/27/20 0630 05/28/20 0300 05/29/20 0300 05/30/20 0241 05/31/20 0530  AST _2 --   --   --   --   ALT _3 --   --   --   --   ALKPHOS 241* 226* 214*  --   --   --   --  BILITOT 1.3* 1.2 1.0  --   --   --   --   PROT 7.4 7.7 7.2  --   --   --   --   ALBUMIN 3.1* 2.9* 2.6*   < > 2.5* 2.4* 2.3*   < > = values in this interval not displayed.   No results for input(s): LIPASE, AMYLASE in the last 168 hours. No results for input(s): AMMONIA in the last 168 hours. CBC: Recent Labs  Lab 05/25/20 0300 05/26/20 0430 05/27/20 0630 05/27/20 1040 05/28/20 0300 05/29/20 0300 05/30/20 0242 05/31/20 0530  WBC 5.6 5.6  --  7.6  --   --   --   --   HGB 8.0* 8.0*   < > 7.6*   < > 7.8* 8.0* 7.5*  HCT 25.7* 25.5*   < > 24.6*   < > 24.7* 25.3* 24.9*  MCV 91.5 91.1  --  93.5  --   --    --   --   PLT 138* 121*  --  139*  --   --   --   --    < > = values in this interval not displayed.   Cardiac Enzymes: No results for input(s): CKTOTAL, CKMB, CKMBINDEX, TROPONINI in the last 168 hours. CBG: Recent Labs  Lab 05/29/20 2335 05/30/20 1137 05/30/20 1623 05/30/20 2330 05/31/20 0533  GLUCAP 137* 147* 126* 134* 128*    Iron Studies: No results for input(s): IRON, TIBC, TRANSFERRIN, FERRITIN in the last 72 hours. Studies/Results: No results found.  Medications: Infusions: . sodium chloride 10 mL/hr at 05/28/20 1100  . feeding supplement (OSMOLITE 1.5 CAL) 65 mL/hr at 05/31/20 0400    Scheduled Medications: . carvedilol  12.5 mg Per Tube BID WC  . chlorhexidine gluconate (MEDLINE KIT)  15 mL Mouth Rinse BID  . Chlorhexidine Gluconate Cloth  6 each Topical Daily  . Chlorhexidine Gluconate Cloth  6 each Topical Q0600  . Chlorhexidine Gluconate Cloth  6 each Topical Q0600  . clonazePAM  0.5 mg Per Tube BID  . darbepoetin (ARANESP) injection - DIALYSIS  60 mcg Intravenous Q Mon-HD  . feeding supplement (PROSource TF)  45 mL Per Tube 5 X Daily  . fentaNYL  1 patch Transdermal Q72H   And  . fentaNYL  1 patch Transdermal Q72H  . levothyroxine  125 mcg Per Tube Q0600  . linezolid  600 mg Per Tube Q12H  . mouth rinse  15 mL Mouth Rinse 10 times per day  . midazolam  2 mg Intravenous Once  . multivitamin with minerals  1 tablet Per Tube Daily  . nutrition supplement (JUVEN)  1 packet Per Tube BID BM  . pantoprazole sodium  40 mg Per Tube Daily  . sodium chloride flush  10-40 mL Intracatheter Q12H    have reviewed scheduled and prn medications.  Physical Exam: General: Elderly male, ill looking sitting up in bed.  On vent via tracheostomy  Heart:RRR, s1s2 nl Lungs: unlabored, +trach Abdomen:soft, Non-tender, dressing present, G tube intact Extremities: anasarca present Neurology: alert awake, interactive (mouths words), follows commands Dialysis Access: Right IJ  temporary HD catheter site clean.  Ria Comment A Kruska 05/31/2020,9:29 AM  LOS: 32 days

## 2020-06-01 DIAGNOSIS — A4902 Methicillin resistant Staphylococcus aureus infection, unspecified site: Secondary | ICD-10-CM | POA: Diagnosis not present

## 2020-06-01 DIAGNOSIS — Z9911 Dependence on respirator [ventilator] status: Secondary | ICD-10-CM | POA: Diagnosis not present

## 2020-06-01 DIAGNOSIS — I959 Hypotension, unspecified: Secondary | ICD-10-CM | POA: Diagnosis not present

## 2020-06-01 DIAGNOSIS — A4159 Other Gram-negative sepsis: Secondary | ICD-10-CM | POA: Diagnosis not present

## 2020-06-01 DIAGNOSIS — Z930 Tracheostomy status: Secondary | ICD-10-CM | POA: Diagnosis not present

## 2020-06-01 LAB — RENAL FUNCTION PANEL
Albumin: 2.2 g/dL — ABNORMAL LOW (ref 3.5–5.0)
Anion gap: 12 (ref 5–15)
BUN: 182 mg/dL — ABNORMAL HIGH (ref 6–20)
CO2: 23 mmol/L (ref 22–32)
Calcium: 9.3 mg/dL (ref 8.9–10.3)
Chloride: 106 mmol/L (ref 98–111)
Creatinine, Ser: 2.48 mg/dL — ABNORMAL HIGH (ref 0.61–1.24)
GFR, Estimated: 30 mL/min — ABNORMAL LOW (ref >60)
Glucose, Bld: 125 mg/dL — ABNORMAL HIGH (ref 70–99)
Phosphorus: 7 mg/dL — ABNORMAL HIGH (ref 2.5–4.6)
Potassium: 5.2 mmol/L — ABNORMAL HIGH (ref 3.5–5.1)
Sodium: 141 mmol/L (ref 135–145)

## 2020-06-01 LAB — HEMOGLOBIN AND HEMATOCRIT, BLOOD
HCT: 23 % — ABNORMAL LOW (ref 39.0–52.0)
Hemoglobin: 7.2 g/dL — ABNORMAL LOW (ref 13.0–17.0)

## 2020-06-01 LAB — POTASSIUM: Potassium: 5.3 mmol/L — ABNORMAL HIGH (ref 3.5–5.1)

## 2020-06-01 LAB — GLUCOSE, CAPILLARY
Glucose-Capillary: 100 mg/dL — ABNORMAL HIGH (ref 70–99)
Glucose-Capillary: 103 mg/dL — ABNORMAL HIGH (ref 70–99)
Glucose-Capillary: 121 mg/dL — ABNORMAL HIGH (ref 70–99)
Glucose-Capillary: 121 mg/dL — ABNORMAL HIGH (ref 70–99)
Glucose-Capillary: 121 mg/dL — ABNORMAL HIGH (ref 70–99)

## 2020-06-01 MED ORDER — SODIUM ZIRCONIUM CYCLOSILICATE 5 G PO PACK
10.0000 g | PACK | Freq: Once | ORAL | Status: AC
  Administered 2020-06-01: 10 g
  Filled 2020-06-01: qty 2

## 2020-06-01 MED ORDER — SODIUM CHLORIDE 0.9 % IV SOLN: INTRAVENOUS | Status: DC

## 2020-06-01 MED ORDER — SODIUM ZIRCONIUM CYCLOSILICATE 5 G PO PACK
5.0000 g | PACK | Freq: Once | ORAL | Status: AC
  Administered 2020-06-01: 5 g via ORAL
  Filled 2020-06-01: qty 1

## 2020-06-01 MED ORDER — HEPARIN SODIUM (PORCINE) 5000 UNIT/ML IJ SOLN
5000.0000 [IU] | Freq: Three times a day (TID) | INTRAMUSCULAR | Status: DC
  Administered 2020-06-01 – 2020-06-03 (×6): 5000 [IU] via SUBCUTANEOUS
  Filled 2020-06-01 (×6): qty 1

## 2020-06-01 MED ORDER — SODIUM CHLORIDE 0.9 % IV SOLN: INTRAVENOUS | Status: AC

## 2020-06-01 MED ORDER — SODIUM ZIRCONIUM CYCLOSILICATE 5 G PO PACK: 10.0000 g | PACK | Freq: Every day | ORAL | Status: DC

## 2020-06-01 NOTE — Progress Notes (Signed)
NAME:  Phillip Klein, MRN:  [id number], DOB:  1965/05/29, LOS: 33 ADMISSION DATE:  05/20/2020, CONSULTATION DATE:  2/27 REFERRING MD:  Silverio Lay, CHIEF COMPLAINT:  Sepsis   Brief History:  55 yo male who presented from [facility]. Admitted 2/24 with septic shock, ESBL [name]. Hickman catheter removed with concern for source of infection. Oliguric AKI , with baseline creatinine 1.8.   Past Medical History:  Chronic hypoxic respiratory failure/ tstomy/vent since 2021 after c spine surgery Hx of MRSA GERD Type 2 diabetes Anemia of chronic illness Osteomyelitis of cervical spine  Quadriplegia Spinal epidural abscess C2-C7 and T9-L4 Malnutrition Neurogenic astrostatic hypotension  HFrEF Untreated HCV Previous IVDU Cirrhosis  Chronic pain  Significant Hospital Events:  2/24 Admit with shock in the setting of klebsiella bacteremia  3/02 albumin 3/03 lasix 3/05 trial of SIMV/PS 3/14 Full support PEEP 10, 40% HGB 6.9 (TRH Mgmt) > diuresed / no transfusion (hemodilution) 3/17 On vent, PEEP 8 / 40%  Consults:  CCM ID Renal 3/2  Procedures:  Trach PTA >>  Hickman catheter PTA >> removed 2/26  Significant Diagnostic Tests:    Micro Data:  Blood 2/24  >> [name] ESBL 1/4 bottle Urine  2/26 >> multiple species   Antimicrobials:  Cefepime 2/24 >> 2/25 Vanc 2/24  >> 2/25  Mero 2/25  >>  3/9  Interim History / Subjective:  Denies complaints this morning.   Objective   Blood pressure (!) 154/78, pulse 75, temperature 97.7 F (36.5 C), temperature source Oral, resp. rate 18, height 6\' 2"  (1.88 m), weight 105.7 kg, SpO2 98 %.    Vent Mode: PRVC FiO2 (%):  [40 %] 40 % Set Rate:  [18 bmp] 18 bmp Vt Set:  [570 mL] 570 mL PEEP:  [5 cmH20] 5 cmH20 Pressure Support:  [10 cmH20] 10 cmH20 Plateau Pressure:  [17 cmH20-20 cmH20] 19 cmH20   Intake/Output Summary (Last 24 hours) at 06/01/2020 0800 Last data filed at 06/01/2020 0630 Gross per 24 hour  Intake 2044.9 ml   Output 1625 ml  Net 419.9 ml   Filed Weights   05/27/20 1630 05/28/20 0500 06/01/20 0352  Weight: 103.9 kg 103.8 kg 105.7 kg    Examination: General: chronically critically ill appearing man laying in bed sleeping HEENT: [place]/AT, eyes anicteric, oral mucosa moist Neck: trach #7 shiley without bleeding or drainage. RIJ dialysis catheter with minimal bruising, no bleeding or drainage. Neuro: arouses easily to verbal and physical stimulation, falls back asleep easily. Not moving extremities, tries to communicate by mouthing words CV: S1S2, RRR PULM: CTAB, breathing synchronously with MV GI: soft, NT, ND. PEG in place without erythema Extremities: warm, anasarca with pitting edema in all extremitiessss Skin: no rashes or wounds  Resolved Hospital Problem list   Septic shock  Assessment & Plan:   Chronic Hypoxemic Respiratory Failure due to Quadriplegia, status post tracheostomy. Chronically Vent Dependent -LTVV, 4-8cc/kg IBW with goal Pplat <30 and DP <15-- con't current settings. Can decrease FiO2 to 21% as tolerated. -routine trach care -has failed previous repeat attempts at vent weaning due to respiratory muscle weakness.  All other issues below per primary team: [name] bacteremia from UTI vs line infection- Hickman catheter removed  Pacemaker in Place AKI likely ATN in setting of septic shock, no longer on hemodialysis (March 20- 25); worsening azotemia again since. Anasarca Jejunostomy tube drainage> improved Hypotension likely related to dysautonomia - off midodrine; now hypertensive Chronic pain Depression, insomnia Anemia of chronic disease + ABLA with Oozing from Wounds /  Uremic Platelet Dysfunction Chronic pain Decubitus ulcers Hyperkalemia   Overall prognosis appears poor due to renal dysfunction, decubitus ulcers, malnutrition. Agree with ongoing goals of care discussions to clarify what his maximal improvement is likely to be and his wishes.  [facility]  - PCCM will continue to f/u 1-2x/week while in hospital   Best practice (evaluated daily)  Diet: tube feeding Pain/Anxiety/Delirium protocol (if indicated): Fentanyl patch VAP protocol (if indicated): yes DVT prophylaxis: sub q heparin GI prophylaxis: n/a Glucose control: SSI Mobility: bed rest Disposition: Per [facility], to vent SNF when bed available  Goals of Care:  Per primary team  Labs   CBC: Recent Labs  Lab 05/26/20 0430 05/27/20 0630 05/27/20 1040 05/28/20 0300 05/29/20 0300 05/30/20 0242 05/31/20 0530 06/01/20 0345  WBC 5.6  --  7.6  --   --   --   --   --   HGB 8.0*   < > 7.6* 7.7* 7.8* 8.0* 7.5* 7.2*  HCT 25.5*   < > 24.6* 25.1* 24.7* 25.3* 24.9* 23.0*  MCV 91.1  --  93.5  --   --   --   --   --   PLT 121*  --  139*  --   --   --   --   --    < > = values in this interval not displayed.    Basic Metabolic Panel: Recent Labs  Lab 05/26/20 0430 05/27/20 0630 05/28/20 0300 05/29/20 0300 05/30/20 0241 05/30/20 0242 05/31/20 0530 06/01/20 0345  NA 138   < > 137 137 139 138 140 141  K 3.6   < > 3.2* 3.8 4.1 4.0 4.6 5.2*  CL 101   < > 102 102 105 104 105 106  CO2 24   < > 24 24 25 23 25 23   GLUCOSE 92   < > 119* 156* 155* 154* 141* 125*  BUN 154*   < > 135* 148* 160* 154* 166* 182*  CREATININE 2.07*   < > 2.11* 2.25* 2.27* 2.26* 2.33* 2.48*  CALCIUM 9.2   < > 9.1 9.1 9.2 9.3 9.4 9.3  MG 1.9  --   --   --   --   --  2.0  --   PHOS 7.6*   < > 7.0* 7.5* 7.3*  --  6.8* 7.0*   < > = values in this interval not displayed.     This patient is critically ill with multiple organ system failure which requires frequent high complexity decision making, assessment, support, evaluation, and titration of therapies. This was completed through the application of advanced monitoring technologies and extensive interpretation of multiple databases. During this encounter critical care time was devoted to patient care services described in this note for 32 minutes.  , DO 06/01/20 8:11 AM [facility]  For contact information, see Amion. If no response to pager, please call PCCM consult pager. After hours, 7PM- 7AM, please call Elink.

## 2020-06-01 NOTE — Plan of Care (Incomplete)
Pt refused mobilty

## 2020-06-01 NOTE — Progress Notes (Signed)
[facility] NEPHROLOGY PROGRESS NOTE  Assessment/ Plan:  # Acute kidney Injury: Secondary to ischemic ATN from pressor requiring septic shock (with background history of chronic hypotension).   Started dialysis for fluid overload refractory to IV diuretics and azotemia on 3/20. Plan for time limited trial of dialysis which continued until 3/24.  His renal function stabilized and UOP picked up some so dialysis has been held.  Unfortunately his renal recovery has stagnated and actually gotten a bit worse over the past few days.  He remains nonoliguric.  His BUN is in the 150s and he's having some nausea today which may be a uremic symptom.  I have given him a fluid challenge to see if this will help temporize.  I have called to d/w [name] today.  She is aware that patient is not a candidate for long-term outpatient dialysis because of very poor functional status and multiple comorbidities. I believe dialysis/renal replacement therapy will not improve his quality of life.  She is asking many questions about other systems such as if he can wean from ventilator, overall improve,  Etc.  We made a plan for her to check in with other providers re: prognosis and we would check in tomorrow. I will engage palliative care again.    # Anemia with intermittent bleeding from wounds: transfuse prn  # Severe protein calorie malnutrition: Ongoing supplemental nutrition/tube feeds.  # History of diastolic heart failure/anasarca: He has significant anasarca in part from his protein calorie malnutrition/low albumin and associated third spacing with preceding use of Florinef.   Consider lasix with albumin if urine output goes down or has worsening volume status but as this is extravascular and BUN so high I'm reluctant to use at this time.   # Chronic ventilator dependent respiratory failure, quadriplegia and multiple pressure wounds/ulcers: With ongoing management for chronic pain/blood loss anemia and  awaiting transfer to LTAC.  Subjective: Seen and examined in ICU. No acute events. Urine output 557m yesterday.  + nausea. No other complaints.   Objective Vital signs in last 24 hours: Vitals:   06/01/20 1300 06/01/20 1400 06/01/20 1500 06/01/20 1556  BP: (!) 146/90 140/81 (!) 142/81   Pulse: 60 90 77   Resp: _0 Temp:    (!) 97.5 F (36.4 C)  TempSrc:    Oral  SpO2: 98% 97% 96%   Weight:      Height:       Weight change:   Intake/Output Summary (Last 24 hours) at 06/01/2020 1606 Last data filed at 06/01/2020 1338 Gross per 24 hour  Intake 3144.64 ml  Output 2100 ml  Net 1044.64 ml       Labs: Basic Metabolic Panel: Recent Labs  Lab 05/30/20 0241 05/30/20 0242 05/31/20 0530 06/01/20 0345 06/01/20 1000  NA 139 138 140 141  --   K 4.1 4.0 4.6 5.2* 5.3*  CL 105 104 105 106  --   CO2 _1 --   GLUCOSE 155* 154* 141* 125*  --   BUN 160* 154* 166* 182*  --   CREATININE 2.27* 2.26* 2.33* 2.48*  --   CALCIUM 9.2 9.3 9.4 9.3  --   PHOS 7.3*  --  6.8* 7.0*  --    Liver Function Tests: Recent Labs  Lab 05/26/20 0430 05/27/20 0630 05/28/20 0300 05/30/20 0241 05/31/20 0530 06/01/20 0345  AST 26 19  --   --   --   --  ALT 17 14  --   --   --   --   ALKPHOS 226* 214*  --   --   --   --   BILITOT 1.2 1.0  --   --   --   --   PROT 7.7 7.2  --   --   --   --   ALBUMIN 2.9* 2.6*   < > 2.4* 2.3* 2.2*   < > = values in this interval not displayed.   No results for input(s): LIPASE, AMYLASE in the last 168 hours. No results for input(s): AMMONIA in the last 168 hours. CBC: Recent Labs  Lab 05/26/20 0430 05/27/20 0630 05/27/20 1040 05/28/20 0300 05/30/20 0242 05/31/20 0530 06/01/20 0345  WBC 5.6  --  7.6  --   --   --   --   HGB 8.0*   < > 7.6*   < > 8.0* 7.5* 7.2*  HCT 25.5*   < > 24.6*   < > 25.3* 24.9* 23.0*  MCV 91.1  --  93.5  --   --   --   --   PLT 121*  --  139*  --   --   --   --    < > = values in this interval not displayed.    Cardiac Enzymes: No results for input(s): CKTOTAL, CKMB, CKMBINDEX, TROPONINI in the last 168 hours. CBG: Recent Labs  Lab 05/31/20 1042 05/31/20 1547 05/31/20 2320 06/01/20 0610 06/01/20 1119  GLUCAP 113* 106* 100* 121* 100*    Iron Studies: No results for input(s): IRON, TIBC, TRANSFERRIN, FERRITIN in the last 72 hours. Studies/Results: No results found.  Medications: Infusions: . sodium chloride 10 mL/hr at 05/28/20 1100  . sodium chloride 125 mL/hr at 06/01/20 1300  . feeding supplement (OSMOLITE 1.5 CAL) 65 mL/hr at 06/01/20 1300    Scheduled Medications: . carvedilol  12.5 mg Per Tube BID WC  . chlorhexidine gluconate (MEDLINE KIT)  15 mL Mouth Rinse BID  . Chlorhexidine Gluconate Cloth  6 each Topical Daily  . Chlorhexidine Gluconate Cloth  6 each Topical Q0600  . Chlorhexidine Gluconate Cloth  6 each Topical Q0600  . clonazePAM  0.5 mg Per Tube BID  . darbepoetin (ARANESP) injection - NON-DIALYSIS  60 mcg Subcutaneous Q Mon-1800  . feeding supplement (PROSource TF)  45 mL Per Tube 5 X Daily  . fentaNYL  1 patch Transdermal Q72H   And  . fentaNYL  1 patch Transdermal Q72H  . heparin injection (subcutaneous)  5,000 Units Subcutaneous Q8H  . levothyroxine  125 mcg Per Tube Q0600  . mouth rinse  15 mL Mouth Rinse 10 times per day  . multivitamin with minerals  1 tablet Per Tube Daily  . nutrition supplement (JUVEN)  1 packet Per Tube BID BM  . pantoprazole sodium  40 mg Per Tube Daily  . sodium chloride flush  10-40 mL Intracatheter Q12H    have reviewed scheduled and prn medications.  Physical Exam: General: Elderly male, ill looking sitting up in bed.  On vent via tracheostomy  Heart:RRR, s1s2 nl Lungs: unlabored, +trach Abdomen:soft, Non-tender, dressing present, G tube intact Extremities: anasarca present Neurology: alert awake, interactive (mouths words), follows commands Dialysis Access: Right IJ temporary HD catheter site clean.  Justin Mend 06/01/2020,4:06 PM  LOS: 33 days

## 2020-06-01 NOTE — Progress Notes (Signed)
Patient refused all dressing changes that involve him turning on his side. Patient also refuse his bath. Leadership at bedside to explain the risk for infection with invasive lines and foley, the mandates required by our facility, and protocol of ICU. Patient verbalize understanding and of risk to include further skin damage and death from infection. Patient was offered several times during the shift for turns which he refused all. Patient did let RN reposition legs and  change his leg wounds and adominal dressings. He also allow RN to CHG the front of his body and place new gown and clean top sheet and blanket.

## 2020-06-01 NOTE — Progress Notes (Signed)
PROGRESS NOTE    Phillip Klein  XFG:[id number] DOB: 05-02-1965 DOA: 04/10/2020 PCP: [name], MD    Brief Narrative: This 55 years old male with PMH significant for chronic hypoxic respiratory failure with tracheostomy and vent since 2021 after C-spine surgery, GERD, DM, anemia of chronic disease, quadriplegia, malnutrition, neurogenic orthostatic hypotension, untreated HCV, previous IV drug use, cirrhosis, chronic pain syndrome was sent from [facility] with septic shock. Patient admitted in the ICU with septic shock secondary to ESBL/ [name] bacteremia. Patient had Hickman's catheter which was removed, presumed to be the source of infection. Patient has completed meropenem for 10 days after removal of the catheter. Patient has developed acute kidney injury secondary to ischemic ATN from pressors requiring septic shock.,Cardiology,Nephrology is following. PCCM is assisting with vent management. Patient had multiple chronic bedsores,wound care is following. Palliative care consulted to discuss goals of care.  Patient eventually started on hemodialysis.  Patient is not a candidate for long-term outpatient dialysis because of poor functional status and multiple comorbidities.  Palliative care consulted and patient and family wants full scope of treatment.  Assessment & Plan:   Principal Problem:   MRSA Pneumonia Active Problems:   Septic shock ([place])   Pressure injury of skin   Malnutrition of moderate degree   Encounter for feeding tube placement   Hypotension   Quadriplegia (HCC)   Gram-negative bacteremia   Infection due to ESBL-producing [name] pneumoniae   Chronic hepatitis C without hepatic coma (HCC)   Anemia   Jejunostomy tube leak (HCC)   Pleural effusion   Sinus arrest   Pacemaker   HFrEF (heart failure with reduced ejection fraction) (HCC)   Severe malnutrition (HCC)   Anasarca   Acute renal failure superimposed on stage 3b chronic kidney  disease (HCC)   Septic shock, POA secondary to [name] bacteremia, UTI versus line infection  -Hickman catheter presumed to be the source of infection, removed. -Blood cultures + [name] bacteremia, UA positive, culture showed multiple species -Completed meropenem for 10 days on 05/12/2020, repeat blood cultures negative -PICC line inserted on 3/8, venous Doppler showed no DVT. -Sepsis physiology resolved.   Active problems Acute kidney injury sec. to ischemic ATN, septic shock requiring pressors, chronic hypotension, anasarca -Patient was placed on albumin and Lasix for anasarca, per nephrology has not been effective. -Nephrology following, not a candidate for long-term dialysis,  however may be an option for management of severe azotemia. -Palliative medicine also following.  Patient and family wants to have full scope of treatment. -05/22/20: Hemodialysis is planned -Patient underwent hemodialysis 3/20, 3/21, 3/22 and 3/24 with 2 L UF.  Hold onto further dialysis as per nephrology. -Monitor for renal recovery. BUN is trending up. -Nephrologist had detailed discussion with the patient about hemodialysis.  Patient understood that getting dialysis 3 times a week will not going to improve his quality of life nor will improve some of his comorbidities. His kidney functions remained stable.  We will continue to monitor for renal recovery. -If no improvement in renal functions, may need to readdress goals of care discussion.  Acute blood loss anemia with intermittent bleeding from wounds : -Platelet dysfunction in the setting of severe azotemia. -Nephrology recommended the conjugated estrogen (Premarin) via tube for 5 days, completed 3/20. -Hemoglobin again down to 6.7, received 2 units PRBC -Hemoglobin is 8 g/dL > 9.6 g/dl > 7.6 > 7.7> 7.8> 8.0> 7.5 >7.2  Essential hypertension Continue Coreg, hydralazine IV as needed with parameters. Increased coreg 12.5 bid as BP  been  elevated.  Acute on chronic combined systolic and diastolic CHF exacerbation with anasarca: -Other contributers include severe protein calorie malnutrition with hypoalbuminemia and third spacing -2D echo showed EF of 40 to 45% with global hypokinesis -Nephrology following, treated with albumin/furosemide, fluid restriction, considered short-term hemodialysis -Patient had hemodialysis on 3/20, 3/21, 3/22, 3/24.  Monitor for renal recovery.  Chronic ventilator dependent respiratory failure, quadriplegia -Management per CCM, continue ventilatory support with trach. -Interventional radiology team has been consulted to replace G tube. -Patient underwent tracheostomy tube change 3/21. -GJ tube changed 3/23. -Continue feeding through tube feed.  Orthostatic hypotension> Resolved. Florinef was discontinued in ICU due to concern for fluid retention, midodrine has been stopped. BP elevated, started coreg.  Chronic pain syndrome -Currently stable, continue fentanyl patch, oxycodone  Depression, insomnia Continue sertraline, trazodone, clonazepam  GERD Continue PPI  Hematuria -resolved, urology was consulted, no need for any acute intervention.  Hypothermia:  -Patient was pancultured, cultures no growth so far. -Hold on antibiotics. He just completed meropenum x 10 days. -Continue warming blanket. -ID consulted, MRSA +, chest x-ray consistent with pneumonia -ID recommended Linezolid, completed for 7 days.  Multiple pressure wounds, present on admission -Full-thickness vertebral column stage IV -Sacrum stage IV, -Right and left ischial tuberosity, stage IV -Right lateral hip, stage IV -Right and left lower leg lateral, unstageable -Right (unstageable now) and left posterior elbow, stage III - left posterior lower arm, unstageable   05/23/2020: Patient has been refusing wound care/dressing.  Obesity Estimated body mass index is 30.43 kg/m as calculated from the  following:   Height as of this encounter: _0  (1.88 m).   Weight as of this encounter: 107.5 kg.   DVT prophylaxis:  SCDs Code Status: Full code. Family Communication:  No family at bed side. Disposition Plan:  Status is: Inpatient  Remains inpatient appropriate because:Inpatient level of care appropriate due to severity of illness   Dispo: The patient is from: SNF              Anticipated d/c is to: LTAC              Patient currently is not medically stable to d/c.   Difficult to place patient Yes   Transfer back to [facility] pending bed availability and cleared from nephrology. Patient had hemodialysis x4, hoping for renal recovery.  If no improvement in renal functions, readdress goals of care discussion.   Consultants:   PCCM  Cardiology  Nephrology  IR  Palliative care  Procedures: Tracheostomy change, GJ tube change, RIJ central line.   Antimicrobials: Anti-infectives (From admission, onward)   Start     Dose/Rate Route Frequency Ordered Stop   05/27/20 2000  linezolid (ZYVOX) 100 MG/5ML suspension 600 mg  Status:  Discontinued        600 mg Per Tube Every 12 hours 05/27/20 0838 05/27/20 1130   05/27/20 2000  linezolid (ZYVOX) tablet 600 mg        600 mg Per Tube Every 12 hours 05/27/20 1130 06/01/20 0925   05/25/20 1845  linezolid (ZYVOX) IVPB 600 mg  Status:  Discontinued        600 mg 300 mL/hr over 60 Minutes Intravenous Every 12 hours 05/25/20 1748 05/27/20 0838   05/19/20 1000  fluconazole (DIFLUCAN) tablet 50 mg       "Followed by" Linked Group Details   50 mg Oral Daily 05/18/20 1048 05/27/20 0929   05/18/20 1145  fluconazole (DIFLUCAN) tablet 100 mg       "  Followed by" Linked Group Details   100 mg Oral  Once 05/18/20 1048 05/18/20 1145   05/11/20 0800  meropenem (MERREM) 1 g in sodium chloride 0.9 % 100 mL IVPB        1 g 200 mL/hr over 30 Minutes Intravenous Every 8 hours 05/11/20 0707 05/12/20 1555   05/11/20 0715  meropenem  (MERREM) 1 g in sodium chloride 0.9 % 100 mL IVPB  Status:  Discontinued        1 g 200 mL/hr over 30 Minutes Intravenous Every 8 hours 05/11/20 0706 05/11/20 0707   05/03/20 2200  meropenem (MERREM) 1 g in sodium chloride 0.9 % 100 mL IVPB  Status:  Discontinued        1 g 200 mL/hr over 30 Minutes Intravenous Every 12 hours 05/03/20 1041 05/11/20 0706   04/30/20 1145  meropenem (MERREM) 2 g in sodium chloride 0.9 % 100 mL IVPB  Status:  Discontinued        2 g 200 mL/hr over 30 Minutes Intravenous Every 12 hours 04/30/20 1052 05/03/20 1041   04/30/20 0530  ceFEPIme (MAXIPIME) 2 g in sodium chloride 0.9 % 100 mL IVPB  Status:  Discontinued        2 g 200 mL/hr over 30 Minutes Intravenous Every 12 hours 04/14/2020 1852 04/30/20 1051   04/12/2020 1852  vancomycin variable dose per unstable renal function (pharmacist dosing)  Status:  Discontinued         Does not apply See admin instructions 05/03/2020 1852 04/30/20 1051   04/10/2020 1630  vancomycin (VANCOREADY) IVPB 2000 mg/400 mL        2,000 mg 200 mL/hr over 120 Minutes Intravenous  Once 04/11/2020 1621 04/20/2020 2008   04/09/2020 1630  ceFEPIme (MAXIPIME) 2 g in sodium chloride 0.9 % 100 mL IVPB        2 g 200 mL/hr over 30 Minutes Intravenous  Once 04/17/2020 1621 05/02/2020 1749     Subjective: Patient was seen and examined at bedside.  Overnight events noted.   Patient seems tired, still appears swollen with scrotal edema and generalized anasarca.   He communicates by mouthing words, difficult to understand sometimes, reports feeling better. He was explained, if no renal recovery,  we might need to readdress goals of care discussion.  Objective: Vitals:   06/01/20 0400 06/01/20 0743 06/01/20 0836 06/01/20 1121  BP: (!) 154/78  (!) 144/78   Pulse: 75  87   Resp: 18  18   Temp:  97.7 F (36.5 C)  (!) 97.4 F (36.3 C)  TempSrc:  Oral  Axillary  SpO2: 98%  98%   Weight:      Height:        Intake/Output Summary (Last 24 hours) at  06/01/2020 1143 Last data filed at 06/01/2020 0630 Gross per 24 hour  Intake 2044.9 ml  Output 1625 ml  Net 419.9 ml   Filed Weights   05/27/20 1630 05/28/20 0500 06/01/20 0352  Weight: 103.9 kg 103.8 kg 105.7 kg    Examination:  General exam: Appears calm and comfortable, not in any distress.  HEENT: Tracheostomy tube connected with ventilator, vent dependent. RIJ central line, no signs of infection. Respiratory system: Clear to auscultation. Respiratory effort normal. Cardiovascular system: S1 & S2 heard, RRR. No JVD, murmurs, rubs, gallops or clicks. +++ pedal edema. Gastrointestinal system: Abdomen is nondistended, soft and nontender. No organomegaly or masses felt.   Normal bowel sounds heard. Colostomy bag on left. Central nervous  system: Alert and oriented x 3. No focal neurological deficits. Extremities: Bilateral heal protectors, Pitting edema, Anasarca. Skin: Multiple pressure ulcers in multiple locations. Psychiatry: Judgement and insight appear normal. Mood & affect appropriate.     Data Reviewed: I have personally reviewed following labs and imaging studies  CBC: Recent Labs  Lab 05/26/20 0430 05/27/20 0630 05/27/20 1040 05/28/20 0300 05/29/20 0300 05/30/20 0242 05/31/20 0530 06/01/20 0345  WBC 5.6  --  7.6  --   --   --   --   --   HGB 8.0*   < > 7.6* 7.7* 7.8* 8.0* 7.5* 7.2*  HCT 25.5*   < > 24.6* 25.1* 24.7* 25.3* 24.9* 23.0*  MCV 91.1  --  93.5  --   --   --   --   --   PLT 121*  --  139*  --   --   --   --   --    < > = values in this interval not displayed.   Basic Metabolic Panel: Recent Labs  Lab 05/26/20 0430 05/27/20 0630 05/28/20 0300 05/29/20 0300 05/30/20 0241 05/30/20 0242 05/31/20 0530 06/01/20 0345 06/01/20 1000  NA 138   < > 137 137 139 138 140 141  --   K 3.6   < > 3.2* 3.8 4.1 4.0 4.6 5.2* 5.3*  CL 101   < > 102 102 105 104 105 106  --   CO2 24   < > _0 --   GLUCOSE 92   < > 119* 156* 155* 154* 141* 125*  --    BUN 154*   < > 135* 148* 160* 154* 166* 182*  --   CREATININE 2.07*   < > 2.11* 2.25* 2.27* 2.26* 2.33* 2.48*  --   CALCIUM 9.2   < > 9.1 9.1 9.2 9.3 9.4 9.3  --   MG 1.9  --   --   --   --   --  2.0  --   --   PHOS 7.6*   < > 7.0* 7.5* 7.3*  --  6.8* 7.0*  --    < > = values in this interval not displayed.   GFR: Estimated Creatinine Clearance: 45.1 mL/min (A) (by C-G formula based on SCr of 2.48 mg/dL (H)). Liver Function Tests: Recent Labs  Lab 05/26/20 0430 05/27/20 0630 05/28/20 0300 05/29/20 0300 05/30/20 0241 05/31/20 0530 06/01/20 0345  AST 26 19  --   --   --   --   --   ALT 17 14  --   --   --   --   --   ALKPHOS 226* 214*  --   --   --   --   --   BILITOT 1.2 1.0  --   --   --   --   --   PROT 7.7 7.2  --   --   --   --   --   ALBUMIN 2.9* 2.6* 2.5* 2.5* 2.4* 2.3* 2.2*   No results for input(s): LIPASE, AMYLASE in the last 168 hours. No results for input(s): AMMONIA in the last 168 hours. Coagulation Profile: No results for input(s): INR, PROTIME in the last 168 hours. Cardiac Enzymes: No results for input(s): CKTOTAL, CKMB, CKMBINDEX, TROPONINI in the last 168 hours. BNP (last 3 results) No results for input(s): PROBNP in the last 8760 hours. HbA1C: No results for input(s): HGBA1C in the last  72 hours. CBG: Recent Labs  Lab 05/31/20 1042 05/31/20 1547 05/31/20 2320 06/01/20 0610 06/01/20 1119  GLUCAP 113* 106* 100* 121* 100*   Lipid Profile: No results for input(s): CHOL, HDL, LDLCALC, TRIG, CHOLHDL, LDLDIRECT in the last 72 hours. Thyroid Function Tests: No results for input(s): TSH, T4TOTAL, FREET4, T3FREE, THYROIDAB in the last 72 hours. Anemia Panel: No results for input(s): VITAMINB12, FOLATE, FERRITIN, TIBC, IRON, RETICCTPCT in the last 72 hours. Sepsis Labs: No results for input(s): PROCALCITON, LATICACIDVEN in the last 168 hours.  Recent Results (from the past 240 hour(s))  Culture, Urine     Status: Abnormal   Collection Time: 05/23/20   9:31 AM   Specimen: Urine, Random  Result Value Ref Range Status   Specimen Description URINE, RANDOM  Final   Special Requests   Final    NONE Performed at [facility], [address] [place], [place] [address]    Culture MULTIPLE SPECIES PRESENT, SUGGEST RECOLLECTION (A)  Final   Report Status 05/24/2020 FINAL  Final  Culture, blood (routine x 2)     Status: None   Collection Time: 05/23/20 10:02 AM   Specimen: BLOOD LEFT HAND  Result Value Ref Range Status   Specimen Description BLOOD LEFT HAND  Final   Special Requests   Final    BOTTLES DRAWN AEROBIC ONLY Blood Culture results may not be optimal due to an inadequate volume of blood received in culture bottles   Culture   Final    NO GROWTH 5 DAYS Performed at [facility], [place] [address], [place], [place] [address]    Report Status 05/28/2020 FINAL  Final  Culture, blood (routine x 2)     Status: None   Collection Time: 05/23/20 10:17 AM   Specimen: BLOOD LEFT HAND  Result Value Ref Range Status   Specimen Description BLOOD LEFT HAND  Final   Special Requests   Final    BOTTLES DRAWN AEROBIC ONLY Blood Culture results may not be optimal due to an inadequate volume of blood received in culture bottles   Culture   Final    NO GROWTH 5 DAYS Performed at [facility], [place] [address], [place], [place] [address]    Report Status 05/28/2020 FINAL  Final  Culture, Respiratory w Gram Stain     Status: None   Collection Time: 05/23/20  5:12 PM   Specimen: Tracheal Aspirate; Respiratory  Result Value Ref Range Status   Specimen Description TRACHEAL ASPIRATE  Final   Special Requests NONE  Final   Gram Stain   Final    MODERATE WBC PRESENT, PREDOMINANTLY PMN ABUNDANT GRAM POSITIVE COCCI IN PAIRS IN CHAINS Performed at [facility], [place] [address] [place], [place] [address]    Culture   Final    MODERATE METHICILLIN RESISTANT STAPHYLOCOCCUS AUREUS   Report Status 05/25/2020 FINAL  Final   Organism  ID, Bacteria METHICILLIN RESISTANT STAPHYLOCOCCUS AUREUS  Final      Susceptibility   Methicillin resistant staphylococcus aureus - MIC*    CIPROFLOXACIN >=8 RESISTANT Resistant     ERYTHROMYCIN >=8 RESISTANT Resistant     GENTAMICIN <=0.5 SENSITIVE Sensitive     OXACILLIN >=4 RESISTANT Resistant     TETRACYCLINE <=1 SENSITIVE Sensitive     VANCOMYCIN <=0.5 SENSITIVE Sensitive     TRIMETH/SULFA <=10 SENSITIVE Sensitive     CLINDAMYCIN >=8 RESISTANT Resistant     RIFAMPIN <=0.5 SENSITIVE Sensitive  Inducible Clindamycin NEGATIVE Sensitive     * MODERATE METHICILLIN RESISTANT STAPHYLOCOCCUS AUREUS    Radiology Studies: No results found. Scheduled Meds: . carvedilol  12.5 mg Per Tube BID WC  . chlorhexidine gluconate (MEDLINE KIT)  15 mL Mouth Rinse BID  . Chlorhexidine Gluconate Cloth  6 each Topical Daily  . Chlorhexidine Gluconate Cloth  6 each Topical Q0600  . Chlorhexidine Gluconate Cloth  6 each Topical Q0600  . clonazePAM  0.5 mg Per Tube BID  . darbepoetin (ARANESP) injection - NON-DIALYSIS  60 mcg Subcutaneous Q Mon-1800  . feeding supplement (PROSource TF)  45 mL Per Tube 5 X Daily  . fentaNYL  1 patch Transdermal Q72H   And  . fentaNYL  1 patch Transdermal Q72H  . levothyroxine  125 mcg Per Tube Q0600  . mouth rinse  15 mL Mouth Rinse 10 times per day  . multivitamin with minerals  1 tablet Per Tube Daily  . nutrition supplement (JUVEN)  1 packet Per Tube BID BM  . pantoprazole sodium  40 mg Per Tube Daily  . sodium chloride flush  10-40 mL Intracatheter Q12H   Continuous Infusions: . sodium chloride 10 mL/hr at 05/28/20 1100  . sodium chloride 125 mL/hr at 06/01/20 0630  . feeding supplement (OSMOLITE 1.5 CAL) 1,000 mL (05/31/20 1751)     LOS: 33 days    Time spent: 35 mins    Mahamed Zalewski, MD [facility]   If 7PM-7AM, please contact night-coverage

## 2020-06-02 ENCOUNTER — Encounter (HOSPITAL_COMMUNITY): Payer: Self-pay | Admitting: Pulmonary Disease

## 2020-06-02 DIAGNOSIS — Z7189 Other specified counseling: Secondary | ICD-10-CM | POA: Diagnosis not present

## 2020-06-02 DIAGNOSIS — A4159 Other Gram-negative sepsis: Secondary | ICD-10-CM | POA: Diagnosis not present

## 2020-06-02 DIAGNOSIS — Z930 Tracheostomy status: Secondary | ICD-10-CM | POA: Diagnosis not present

## 2020-06-02 DIAGNOSIS — N170 Acute kidney failure with tubular necrosis: Secondary | ICD-10-CM | POA: Diagnosis not present

## 2020-06-02 DIAGNOSIS — I959 Hypotension, unspecified: Secondary | ICD-10-CM | POA: Diagnosis not present

## 2020-06-02 DIAGNOSIS — Z9911 Dependence on respirator [ventilator] status: Secondary | ICD-10-CM | POA: Diagnosis not present

## 2020-06-02 DIAGNOSIS — I5020 Unspecified systolic (congestive) heart failure: Secondary | ICD-10-CM | POA: Diagnosis not present

## 2020-06-02 DIAGNOSIS — A4902 Methicillin resistant Staphylococcus aureus infection, unspecified site: Secondary | ICD-10-CM | POA: Diagnosis not present

## 2020-06-02 DIAGNOSIS — G8250 Quadriplegia, unspecified: Secondary | ICD-10-CM | POA: Diagnosis not present

## 2020-06-02 LAB — RENAL FUNCTION PANEL
Albumin: 2.1 g/dL — ABNORMAL LOW (ref 3.5–5.0)
Anion gap: 11 (ref 5–15)
BUN: 188 mg/dL — ABNORMAL HIGH (ref 6–20)
CO2: 23 mmol/L (ref 22–32)
Calcium: 9.2 mg/dL (ref 8.9–10.3)
Chloride: 107 mmol/L (ref 98–111)
Creatinine, Ser: 2.55 mg/dL — ABNORMAL HIGH (ref 0.61–1.24)
GFR, Estimated: 29 mL/min — ABNORMAL LOW (ref >60)
Glucose, Bld: 125 mg/dL — ABNORMAL HIGH (ref 70–99)
Phosphorus: 7.4 mg/dL — ABNORMAL HIGH (ref 2.5–4.6)
Potassium: 5.4 mmol/L — ABNORMAL HIGH (ref 3.5–5.1)
Sodium: 141 mmol/L (ref 135–145)

## 2020-06-02 LAB — GLUCOSE, CAPILLARY
Glucose-Capillary: 100 mg/dL — ABNORMAL HIGH (ref 70–99)
Glucose-Capillary: 104 mg/dL — ABNORMAL HIGH (ref 70–99)

## 2020-06-02 LAB — HEMOGLOBIN AND HEMATOCRIT, BLOOD
HCT: 24.5 % — ABNORMAL LOW (ref 39.0–52.0)
Hemoglobin: 7.5 g/dL — ABNORMAL LOW (ref 13.0–17.0)

## 2020-06-02 LAB — MAGNESIUM: Magnesium: 2 mg/dL (ref 1.7–2.4)

## 2020-06-02 MED ORDER — SODIUM ZIRCONIUM CYCLOSILICATE 5 G PO PACK
10.0000 g | PACK | Freq: Once | ORAL | Status: AC
  Administered 2020-06-02: 10 g
  Filled 2020-06-02: qty 2

## 2020-06-02 NOTE — Progress Notes (Signed)
NAME:  Phillip Klein, MRN:  [id number], DOB:  01/19/1966, LOS: 34 ADMISSION DATE:  21-May-2020, CONSULTATION DATE:  2/27 REFERRING MD:  Silverio Lay, CHIEF COMPLAINT:  Sepsis   Brief History:  55 yo male who presented from [facility]. Admitted 2/24 with septic shock, ESBL [name]. Hickman catheter removed with concern for source of infection. Oliguric AKI , with baseline creatinine 1.8.   Past Medical History:  Chronic hypoxic respiratory failure/ tstomy/vent since 2021 after c spine surgery Hx of MRSA GERD Type 2 diabetes Anemia of chronic illness Osteomyelitis of cervical spine  Quadriplegia Spinal epidural abscess C2-C7 and T9-L4 Malnutrition Neurogenic astrostatic hypotension  HFrEF Untreated HCV Previous IVDU Cirrhosis  Chronic pain  Significant Hospital Events:  2/24 Admit with shock in the setting of klebsiella bacteremia  3/02 albumin 3/03 lasix 3/05 trial of SIMV/PS 3/14 Full support PEEP 10, 40% HGB 6.9 (TRH Mgmt) > diuresed / no transfusion (hemodilution) 3/17 On vent, PEEP 8 / 40%  Consults:  CCM ID Renal 3/2  Procedures:  Trach PTA >>  Hickman catheter PTA >> removed 2/26  Significant Diagnostic Tests:    Micro Data:  Blood 2/24  >> [name] ESBL 1/4 bottle Urine  2/26 >> multiple species   Antimicrobials:  Cefepime 2/24 >> 2/25 Vanc 2/24  >> 2/25  Mero 2/25  >>  3/9  Interim History / Subjective:  Sleeping comfortably this morning.  Objective   Blood pressure 132/86, pulse 63, temperature (!) 97.5 F (36.4 C), temperature source Oral, resp. rate 15, height 6\' 2"  (1.88 m), weight 105.7 kg, SpO2 96 %.    Vent Mode: PRVC FiO2 (%):  [30 %-40 %] 30 % Set Rate:  [18 bmp] 18 bmp Vt Set:  [570 mL] 570 mL PEEP:  [5 cmH20] 5 cmH20 Plateau Pressure:  [19 cmH20-22 cmH20] 19 cmH20   Intake/Output Summary (Last 24 hours) at 06/02/2020 1508 Last data filed at 06/02/2020 1327 Gross per 24 hour  Intake 940.06 ml  Output 1520 ml  Net -579.94 ml    Filed Weights   05/27/20 1630 05/28/20 0500 06/01/20 0352  Weight: 103.9 kg 103.8 kg 105.7 kg    Examination: General: chronically ill appearing man sleeping, woke up when vent was changed HEENT: [place]/AT, eyes anicteric Neck: #7 [place], no bleeding Neuro: awake, trying to communicate by mouthing words, quadriparetic CV: S1S2, bradycardic PULM: no nasal flaring, ventilating with low-normal volumes on PS 12 but panicked looking and mouthing "help me" GI: ND Extremities: anasarca Skin: warm. Wounds not examined.  Resolved Hospital Problem list   Septic shock  Assessment & Plan:   Chronic Hypoxemic Respiratory Failure due to Quadriplegia, status post tracheostomy. Chronically Vent Dependent due to NM weakness. -trialed on PS 12 + CPAP 5 today> within 20-30 minutes he felt very short of breath, although saturations remained >90%.  -Con't routine trach care -Discussed his care with 05-24-1990-- I think he is likely to chronically require MV and certainly would require chronic tracheostomy. My concern is his ability to maintain adequate ventilation, especially if he is under any physiologic stressors and during sleep. I also worry about him having chronic dyspnea and being very uncomfortable if he was off MV. Updated multidisciplinary team.  All other issues below per primary team: [name] bacteremia from UTI vs line infection- Hickman catheter removed  Pacemaker in Place AKI likely ATN in setting of septic shock, no longer on hemodialysis (March 20- 25); worsening azotemia again since. Anasarca Jejunostomy tube drainage> improved Hypotension likely related to dysautonomia -  off midodrine; now hypertensive Chronic pain Depression, insomnia Anemia of chronic disease + ABLA with Oozing from Wounds / Uremic Platelet Dysfunction Chronic pain Decubitus ulcers Hyperkalemia   Overall prognosis appears poor due to renal dysfunction, decubitus ulcers, malnutrition. Goals of care discussions  are ongoing.   [facility] - PCCM will continue to f/u 1-2x/week while in hospital   Best practice (evaluated daily)  Diet: tube feeding Pain/Anxiety/Delirium protocol (if indicated): Fentanyl patch VAP protocol (if indicated): yes DVT prophylaxis: sub q heparin GI prophylaxis: n/a Glucose control: SSI Mobility: bed rest Disposition: Per TR[facility], to vent SNF when bed available  Goals of Care:  Per primary team  Labs   CBC: Recent Labs  Lab 05/27/20 1040 05/28/20 0300 05/29/20 0300 05/30/20 0242 05/31/20 0530 06/01/20 0345 06/02/20 0430  WBC 7.6  --   --   --   --   --   --   HGB 7.6*   < > 7.8* 8.0* 7.5* 7.2* 7.5*  HCT 24.6*   < > 24.7* 25.3* 24.9* 23.0* 24.5*  MCV 93.5  --   --   --   --   --   --   PLT 139*  --   --   --   --   --   --    < > = values in this interval not displayed.    Basic Metabolic Panel: Recent Labs  Lab 05/29/20 0300 05/30/20 0241 05/30/20 0242 05/31/20 0530 06/01/20 0345 06/01/20 1000 06/02/20 0430  NA 137 139 138 140 141  --  141  K 3.8 4.1 4.0 4.6 5.2* 5.3* 5.4*  CL 102 105 104 105 106  --  107  CO2 24 25 23 25 23   --  23  GLUCOSE 156* 155* 154* 141* 125*  --  125*  BUN 148* 160* 154* 166* 182*  --  188*  CREATININE 2.25* 2.27* 2.26* 2.33* 2.48*  --  2.55*  CALCIUM 9.1 9.2 9.3 9.4 9.3  --  9.2  MG  --   --   --  2.0  --   --  2.0  PHOS 7.5* 7.3*  --  6.8* 7.0*  --  7.4*       , DO 06/02/20 3:21 PM [facility]  For contact information, see Amion. If no response to pager, please call PCCM consult pager. After hours, 7PM- 7AM, please call Elink.

## 2020-06-02 NOTE — Progress Notes (Addendum)
PROGRESS NOTE    Phillip Klein  HMC:[id number] DOB: 1965/09/02 DOA: 04/10/2020 PCP: [name], MD    Brief Narrative: This 55 years old male with PMH significant for chronic hypoxic respiratory failure with tracheostomy and vent since 2021 after C-spine surgery, GERD, DM, anemia of chronic disease, quadriplegia, malnutrition, neurogenic orthostatic hypotension, untreated HCV, previous IV drug use, cirrhosis, chronic pain syndrome was sent from [facility] with septic shock. Patient admitted in the ICU with septic shock secondary to ESBL/ [name] bacteremia. Patient had Hickman's catheter which was removed, presumed to be the source of infection. Patient has completed meropenem for 10 days after removal of the catheter. Patient has developed acute kidney injury secondary to ischemic ATN from pressors requiring septic shock.,Cardiology,Nephrology is following. PCCM is assisting with vent management. Patient had multiple chronic bedsores,wound care is following. Palliative care consulted to discuss goals of care.  Patient eventually started on temporary hemodialysis.  Patient is not a candidate for long-term outpatient dialysis because of poor functional status and multiple comorbidities. Family wants full scope of treatment.  Patient is developing uremic symptoms, poor candidate for further dialysis.  He is vent dependent, previous trials at weaning him from ventilator were unsuccessful.  Palliative care is to readdress goals of care.  Assessment & Plan:   Principal Problem:   MRSA Pneumonia Active Problems:   Septic shock ([place])   Pressure injury of skin   Malnutrition of moderate degree   Encounter for feeding tube placement   Hypotension   Quadriplegia (HCC)   Gram-negative bacteremia   Infection due to ESBL-producing [name] pneumoniae   Chronic hepatitis C without hepatic coma (HCC)   Anemia   Jejunostomy tube leak (HCC)   Pleural effusion   Sinus arrest    Pacemaker   HFrEF (heart failure with reduced ejection fraction) (HCC)   Severe malnutrition (HCC)   Anasarca   Acute renal failure superimposed on stage 3b chronic kidney disease (HCC)   Septic shock, POA secondary to [name] bacteremia, UTI versus line infection  -Hickman catheter presumed to be the source of infection, removed. -Blood cultures + [name] bacteremia, UA positive, culture showed multiple species -Completed meropenem for 10 days on 05/12/2020, repeat blood cultures negative -PICC line inserted on 3/8, venous Doppler showed no DVT. -Sepsis physiology resolved.   Active problems Acute kidney injury sec. to ischemic ATN, septic shock requiring pressors, chronic hypotension, anasarca -Patient was placed on albumin and Lasix for anasarca, per nephrology has not been effective. -Nephrology following, not a candidate for long-term dialysis,  however may be an option for management of severe azotemia. -Palliative medicine also following.  Patient and family wants to have full scope of treatment. -05/22/20: Hemodialysis is planned -Patient underwent hemodialysis 3/20, 3/21, 3/22 and 3/24 with 2 L UF.  Hold onto further dialysis as per nephrology. -Monitor for renal recovery. BUN is trending up. -Nephrologist had detailed discussion with the patient about hemodialysis.  Patient understood that getting dialysis 3 times a week will not going to improve his quality of life nor will improve some of his comorbidities. His kidney functions remained stable.  We will continue to monitor for renal recovery. -If no improvement in renal functions, may need to readdress goals of care discussion. -Now he is showing signs of uremia ( Nausea ) -Palliative care reconsulted to readdress goals of care discussion.  Acute blood loss anemia with intermittent bleeding from wounds : -Platelet dysfunction in the setting of severe azotemia. -Nephrology recommended the conjugated estrogen (Premarin)  via tube for 5 days, completed 3/20. -Hemoglobin again down to 6.7, received 2 units PRBC -Hemoglobin is 8 g/dL > 9.6 g/dl > 7.6 > 7.7> 7.8> 8.0> 7.5 >7.2  Essential hypertension Continue Coreg, hydralazine IV as needed with parameters. Continue coreg 12.5 bid .  Acute on chronic combined systolic and diastolic CHF exacerbation with anasarca: -Other contributers include severe protein calorie malnutrition with hypoalbuminemia and third spacing -2D echo showed EF of 40 to 45% with global hypokinesis -Nephrology following, treated with albumin/furosemide, fluid restriction, considered short-term hemodialysis -Patient had hemodialysis on 3/20, 3/21, 3/22, 3/24.  Monitor for renal recovery.  Chronic ventilator dependent respiratory failure, quadriplegia -Management per CCM, continue ventilatory support with trach. -Interventional radiology team has been consulted to replace G tube. -Patient underwent tracheostomy tube change 3/21. -GJ tube changed 3/23. -Continue feeding through tube feed. -Patient is ventilator dependent due to respiratory muscle weakness.  Orthostatic hypotension> Resolved. Florinef was discontinued in ICU due to concern for fluid retention, midodrine has been stopped. BP elevated, started coreg.  Chronic pain syndrome -Currently stable, continue fentanyl patch, oxycodone  Depression, insomnia Continue sertraline, trazodone, clonazepam  GERD Continue PPI  Hematuria -resolved, urology was consulted, no need for any acute intervention.  Hypothermia:  -Patient was pancultured, cultures no growth so far. -Hold on antibiotics. He just completed meropenum x 10 days. -Continue warming blanket. -ID consulted, MRSA +, chest x-ray consistent with pneumonia -ID recommended Linezolid, completed for 7 days.  Multiple pressure wounds, present on admission -Full-thickness vertebral column stage IV -Sacrum stage IV, -Right and left ischial tuberosity, stage  IV -Right lateral hip, stage IV -Right and left lower leg lateral, unstageable -Right (unstageable now) and left posterior elbow, stage III - left posterior lower arm, unstageable   05/23/2020: Patient has been refusing wound care/dressing.  Obesity Estimated body mass index is 30.43 kg/m as calculated from the following:   Height as of this encounter: _0  (1.88 m).   Weight as of this encounter: 107.5 kg.   DVT prophylaxis:  SCDs Code Status: Full code. Family Communication: Spoke with wife [name] on the phone.  Explained in detail that patient has poor prognosis, He is showing signs of uremia and he is not a candidate for long-term hemodialysis,  He is ventilator dependent,  previous attempts at weaning him down were unsuccessful.  Palliative care consulted to readdress goals of care discussion. Disposition Plan:  Status is: Inpatient  Remains inpatient appropriate because:Inpatient level of care appropriate due to severity of illness   Dispo: The patient is from: SNF              Anticipated d/c is to: LTAC              Patient currently is not medically stable to d/c.   Difficult to place patient Yes   Transfer back to [facility] or [facility] pending bed availability and clearance from nephrology. Spoke with wife  [name]:  Explained in detail that patient has poor prognosis, He is showing signs of uremia and he is not a candidate for long-term hemodialysis,  He is ventilator dependent,  previous attempts at weaning him down were unsuccessful.  Palliative care consulted to readdress goals of care discussion.    Consultants:   PCCM  Cardiology  Nephrology  IR  Palliative care  Procedures: Tracheostomy change, GJ tube change, RIJ central line.   Antimicrobials: Anti-infectives (From admission, onward)   Start     Dose/Rate Route Frequency Ordered Stop   05/27/20  2000  linezolid (ZYVOX) 100 MG/5ML suspension 600 mg  Status:  Discontinued        600 mg Per Tube Every 12  hours 05/27/20 0838 05/27/20 1130   05/27/20 2000  linezolid (ZYVOX) tablet 600 mg        600 mg Per Tube Every 12 hours 05/27/20 1130 06/01/20 0925   05/25/20 1845  linezolid (ZYVOX) IVPB 600 mg  Status:  Discontinued        600 mg 300 mL/hr over 60 Minutes Intravenous Every 12 hours 05/25/20 1748 05/27/20 0838   05/19/20 1000  fluconazole (DIFLUCAN) tablet 50 mg       "Followed by" Linked Group Details   50 mg Oral Daily 05/18/20 1048 05/27/20 0929   05/18/20 1145  fluconazole (DIFLUCAN) tablet 100 mg       "Followed by" Linked Group Details   100 mg Oral  Once 05/18/20 1048 05/18/20 1145   05/11/20 0800  meropenem (MERREM) 1 g in sodium chloride 0.9 % 100 mL IVPB        1 g 200 mL/hr over 30 Minutes Intravenous Every 8 hours 05/11/20 0707 05/12/20 1555   05/11/20 0715  meropenem (MERREM) 1 g in sodium chloride 0.9 % 100 mL IVPB  Status:  Discontinued        1 g 200 mL/hr over 30 Minutes Intravenous Every 8 hours 05/11/20 0706 05/11/20 0707   05/03/20 2200  meropenem (MERREM) 1 g in sodium chloride 0.9 % 100 mL IVPB  Status:  Discontinued        1 g 200 mL/hr over 30 Minutes Intravenous Every 12 hours 05/03/20 1041 05/11/20 0706   04/30/20 1145  meropenem (MERREM) 2 g in sodium chloride 0.9 % 100 mL IVPB  Status:  Discontinued        2 g 200 mL/hr over 30 Minutes Intravenous Every 12 hours 04/30/20 1052 05/03/20 1041   04/30/20 0530  ceFEPIme (MAXIPIME) 2 g in sodium chloride 0.9 % 100 mL IVPB  Status:  Discontinued        2 g 200 mL/hr over 30 Minutes Intravenous Every 12 hours 04/28/2020 1852 04/30/20 1051   04/20/2020 1852  vancomycin variable dose per unstable renal function (pharmacist dosing)  Status:  Discontinued         Does not apply See admin instructions 05/02/2020 1852 04/30/20 1051   04/12/2020 1630  vancomycin (VANCOREADY) IVPB 2000 mg/400 mL        2,000 mg 200 mL/hr over 120 Minutes Intravenous  Once 04/17/2020 1621 04/13/2020 2008   04/10/2020 1630  ceFEPIme (MAXIPIME) 2 g in  sodium chloride 0.9 % 100 mL IVPB        2 g 200 mL/hr over 30 Minutes Intravenous  Once 04/11/2020 1621 04/27/2020 1749     Subjective: Patient was seen and examined at bedside.  Overnight events noted. He has refused care in the night, dressing change and bath, states he feels tired. Patient is swollen with scrotal edema and generalized anasarca.  Colostomy bag on left abdomen. He communicates by mouthing words, difficult to understand sometimes. He was explained his kidneys functions not improving,   we might need to readdress goals of care discussion.  Objective: Vitals:   06/02/20 0840 06/02/20 0900 06/02/20 1000 06/02/20 1100  BP:    132/86  Pulse: 79 82 70 60  Resp: 18     Temp:      TempSrc:      SpO2: 96%  Weight:      Height:        Intake/Output Summary (Last 24 hours) at 06/02/2020 1153 Last data filed at 06/02/2020 1100 Gross per 24 hour  Intake 1698.88 ml  Output 2095 ml  Net -396.12 ml   Filed Weights   05/27/20 1630 05/28/20 0500 06/01/20 0352  Weight: 103.9 kg 103.8 kg 105.7 kg    Examination:  General exam: Appears calm and comfortable, not in any distress.  HEENT: Tracheostomy tube connected with ventilator, vent dependent. RIJ central line, no signs of infection. Respiratory system: Clear to auscultation. Respiratory effort normal. Cardiovascular system: S1 & S2 heard, RRR. No JVD, murmurs, rubs, gallops or clicks. +++ pedal edema. Gastrointestinal system: Abdomen is nondistended, soft and nontender. No organomegaly or masses felt.   Normal bowel sounds heard. Colostomy bag on left. Central nervous system: Alert and oriented x 3. No focal neurological deficits. Extremities: Bilateral heal protectors, Pitting edema, Anasarca. Skin: Multiple pressure ulcers in multiple locations. Psychiatry: Judgement and insight appear normal. Mood & affect appropriate.     Data Reviewed: I have personally reviewed following labs and imaging studies  CBC: Recent  Labs  Lab 05/27/20 1040 05/28/20 0300 05/29/20 0300 05/30/20 0242 05/31/20 0530 06/01/20 0345 06/02/20 0430  WBC 7.6  --   --   --   --   --   --   HGB 7.6*   < > 7.8* 8.0* 7.5* 7.2* 7.5*  HCT 24.6*   < > 24.7* 25.3* 24.9* 23.0* 24.5*  MCV 93.5  --   --   --   --   --   --   PLT 139*  --   --   --   --   --   --    < > = values in this interval not displayed.   Basic Metabolic Panel: Recent Labs  Lab 05/29/20 0300 05/30/20 0241 05/30/20 0242 05/31/20 0530 06/01/20 0345 06/01/20 1000 06/02/20 0430  NA 137 139 138 140 141  --  141  K 3.8 4.1 4.0 4.6 5.2* 5.3* 5.4*  CL 102 105 104 105 106  --  107  CO2 _0 --  23  GLUCOSE 156* 155* 154* 141* 125*  --  125*  BUN 148* 160* 154* 166* 182*  --  188*  CREATININE 2.25* 2.27* 2.26* 2.33* 2.48*  --  2.55*  CALCIUM 9.1 9.2 9.3 9.4 9.3  --  9.2  MG  --   --   --  2.0  --   --  2.0  PHOS 7.5* 7.3*  --  6.8* 7.0*  --  7.4*   GFR: Estimated Creatinine Clearance: 43.9 mL/min (A) (by C-G formula based on SCr of 2.55 mg/dL (H)). Liver Function Tests: Recent Labs  Lab 05/27/20 0630 05/28/20 0300 05/29/20 0300 05/30/20 0241 05/31/20 0530 06/01/20 0345 06/02/20 0430  AST 19  --   --   --   --   --   --   ALT 14  --   --   --   --   --   --   ALKPHOS 214*  --   --   --   --   --   --   BILITOT 1.0  --   --   --   --   --   --   PROT 7.2  --   --   --   --   --   --  ALBUMIN 2.6*   < > 2.5* 2.4* 2.3* 2.2* 2.1*   < > = values in this interval not displayed.   No results for input(s): LIPASE, AMYLASE in the last 168 hours. No results for input(s): AMMONIA in the last 168 hours. Coagulation Profile: No results for input(s): INR, PROTIME in the last 168 hours. Cardiac Enzymes: No results for input(s): CKTOTAL, CKMB, CKMBINDEX, TROPONINI in the last 168 hours. BNP (last 3 results) No results for input(s): PROBNP in the last 8760 hours. HbA1C: No results for input(s): HGBA1C in the last 72 hours. CBG: Recent Labs   Lab 06/01/20 1119 06/01/20 1711 06/01/20 1945 06/01/20 2345 06/02/20 1104  GLUCAP 100* 121* 103* 121* 104*   Lipid Profile: No results for input(s): CHOL, HDL, LDLCALC, TRIG, CHOLHDL, LDLDIRECT in the last 72 hours. Thyroid Function Tests: No results for input(s): TSH, T4TOTAL, FREET4, T3FREE, THYROIDAB in the last 72 hours. Anemia Panel: No results for input(s): VITAMINB12, FOLATE, FERRITIN, TIBC, IRON, RETICCTPCT in the last 72 hours. Sepsis Labs: No results for input(s): PROCALCITON, LATICACIDVEN in the last 168 hours.  Recent Results (from the past 240 hour(s))  Culture, Respiratory w Gram Stain     Status: None   Collection Time: 05/23/20  5:12 PM   Specimen: Tracheal Aspirate; Respiratory  Result Value Ref Range Status   Specimen Description TRACHEAL ASPIRATE  Final   Special Requests NONE  Final   Gram Stain   Final    MODERATE WBC PRESENT, PREDOMINANTLY PMN ABUNDANT GRAM POSITIVE COCCI IN PAIRS IN CHAINS Performed at [facility], [place] [address] [place], [place] [address]    Culture   Final    MODERATE METHICILLIN RESISTANT STAPHYLOCOCCUS AUREUS   Report Status 05/25/2020 FINAL  Final   Organism ID, Bacteria METHICILLIN RESISTANT STAPHYLOCOCCUS AUREUS  Final      Susceptibility   Methicillin resistant staphylococcus aureus - MIC*    CIPROFLOXACIN >=8 RESISTANT Resistant     ERYTHROMYCIN >=8 RESISTANT Resistant     GENTAMICIN <=0.5 SENSITIVE Sensitive     OXACILLIN >=4 RESISTANT Resistant     TETRACYCLINE <=1 SENSITIVE Sensitive     VANCOMYCIN <=0.5 SENSITIVE Sensitive     TRIMETH/SULFA <=10 SENSITIVE Sensitive     CLINDAMYCIN >=8 RESISTANT Resistant     RIFAMPIN <=0.5 SENSITIVE Sensitive     Inducible Clindamycin NEGATIVE Sensitive     * MODERATE METHICILLIN RESISTANT STAPHYLOCOCCUS AUREUS    Radiology Studies: No results found. Scheduled Meds: . carvedilol  12.5 mg Per Tube BID WC  . chlorhexidine gluconate (MEDLINE KIT)  15 mL Mouth Rinse BID  .  Chlorhexidine Gluconate Cloth  6 each Topical Daily  . Chlorhexidine Gluconate Cloth  6 each Topical Q0600  . Chlorhexidine Gluconate Cloth  6 each Topical Q0600  . clonazePAM  0.5 mg Per Tube BID  . darbepoetin (ARANESP) injection - NON-DIALYSIS  60 mcg Subcutaneous Q Mon-1800  . feeding supplement (PROSource TF)  45 mL Per Tube 5 X Daily  . fentaNYL  1 patch Transdermal Q72H   And  . fentaNYL  1 patch Transdermal Q72H  . heparin injection (subcutaneous)  5,000 Units Subcutaneous Q8H  . levothyroxine  125 mcg Per Tube Q0600  . mouth rinse  15 mL Mouth Rinse 10 times per day  . nutrition supplement (JUVEN)  1 packet Per Tube BID BM  . pantoprazole sodium  40 mg Per Tube Daily  . sodium chloride flush  10-40 mL Intracatheter Q12H   Continuous Infusions: .  sodium chloride 10 mL/hr at 06/02/20 1000  . feeding supplement (OSMOLITE 1.5 CAL) 1,000 mL (06/02/20 0536)     LOS: 34 days    Time spent: 35 mins    Cheick Suhr, MD [facility]   If 7PM-7AM, please contact night-coverage

## 2020-06-02 NOTE — Progress Notes (Addendum)
Nutrition Follow-up  DOCUMENTATION CODES:   Non-severe (moderate) malnutrition in context of chronic illness  INTERVENTION:   TF via J tube:   Osmolite 1.5 at 65 ml/h (1560 ml per day) Prosource TF 45 ml 5 times per day  Provides 2540 kcal, 160 gm protein, 1192 ml free water daily  Ensure adequate water flushes to prevent tube from clogging: 20-30 ml every 4 hours, before and after med administration and when continuous feeding is interrupted.  Continue Juven BID, each packet provides 80 calories, 8 grams of carbohydrate, 2.5  grams of protein (collagen), 7 grams of L-arginine and 7 grams of L-glutamine; supplement contains CaHMB, Vitamins C, E, B12 and Zinc to promote wound healing.  D/C MVI with minerals.  NUTRITION DIAGNOSIS:   Moderate Malnutrition related to chronic illness (CHF, cirrhosis) as evidenced by moderate fat depletion,severe muscle depletion.  Ongoing  GOAL:   Patient will meet greater than or equal to 90% of their needs  Met   MONITOR:   Vent status,Labs,Weight trends,TF tolerance,Skin,I & O's  REASON FOR ASSESSMENT:   Ventilator,Consult Enteral/tube feeding initiation and management  ASSESSMENT:   55 year old male who presented to the ED on 2/24 from [facility] with hypotension, fever. Pt was scheduled to have revision of J-tube on 2/24 and was found to be hypotensive. PMH of vent dependence via trach, quadriplegia after cervical spine surgery in summer of 2021, s/p ex-lap for G-tube malfunction, multiple pressure injuries, GERD, T2DM, anemia, CHF, previous IVDA, cirrhosis. Pt admitted with septic shock.  IR exchanged J-tube 3/23. Currently receiving Osmolite 1.5 at 65 ml/h with Prosource TF 45 ml 5 times per day. Tolerating well.  HD on hold since 3/24. Renal function has worsened. Received a fluid challenge 3/29, but renal function did not improve.  Patient is a poor long term HD candidate per Nephrology. Palliative care team has been re-consulted  to discuss goals of care with family.   Patient remains on chronic ventilatory support via trach.  MV: 10.8 L/min Temp (24hrs), Avg:97.3 F (36.3 C), Min:96.1 F (35.6 C), Max:97.7 F (36.5 C)   Labs reviewed. K 5.4, Phos 7.4, BUN 188, creatinine 2.55  CBG: 121-103-121 (3/29 evening)  Medications reviewed and include Aranesp, MVI with minerals, Juven.  Admit weight: 107.5 kg  Current weight: 105.7 kg    UOP: 720 mL x 24 hrs Stool output: 875 mL x 24 hours (colostomy) I&O's: +150.5 ml since admit  Patient has been refusing care by RN. He is requiring a lot of encouragement for nursing to complete care, including bath, repositioning, changing dressings.   Diet Order:   Diet Order            Diet NPO time specified  Diet effective now                 EDUCATION NEEDS:   No education needs have been identified at this time  Skin:  Skin Assessment: Skin Integrity Issues: Skin Integrity Issues:: Incisions DTI: R leg Stage I: R leg Stage III: L elbow Stage IV: vertebral column, sacrum, bilateral ischial tuberosity, R hip, R lower leg Unstageable: L leg x 2, R leg x 3, R elbow, L arm Incisions: closed abdomen Other: skin tear right arm   Last BM:  3/29 colostomy  Height:   Ht Readings from Last 1 Encounters:  05/10/20 '6\' 2"'  (1.88 m)    Weight:   Wt Readings from Last 1 Encounters:  06/01/20 105.7 kg    Ideal Body Weight:  86.4  kg  BMI:  Body mass index is 29.92 kg/m.  Estimated Nutritional Needs:   Kcal:  2400-2600  Protein:  150-170 grams  Fluid:  >/= 2.0 L   Lucas Mallow, RD, LDN, CNSC Please refer to Amion for contact information.

## 2020-06-02 NOTE — Progress Notes (Signed)
Daily Progress Note   Patient Name: Phillip Klein       Date: 06/02/2020 DOB: 04-26-1965  Age: 55 y.o. MRN#: [id number] Attending Physician: Shawna Clamp, MD Primary Care Physician: [name], MD Admit Date: 04/08/2020  Reason for Consultation/Follow-up: Establishing goals of care  Subjective: Phillip Klein is lethargic. He is unable to verbalize back to me his current medical issues or his options for care. He is not able to participate in goals of care discussion. Patient's kidney function is not recovering s/p CRRT. He is not a candidate for continued dialysis. Discussed with Phillip Klein- patient is likely going to die from this renal failure.  Phillip Klein had spoken with multiple providers and states "there's no hope".  We discussed that all efforts had been made and attempting to reverse what can be reversed. Unfortunately, patient has reached a point where he has multiple medical issues that are irreversible that will ultimately lead to him dying. Reviewed what transition to full comfort measures only would look like- stopping medications and interventions that are not provided for comfort- stopping labs, and when patient became unresponsive- then would proceed with discontinuing the ventilator- all while managing symptoms with medications.  Phillip Klein asked about patient having something to eat or drink if he wanted- (previously at [facility] she had given him sweet tea while he was on the ventilator) - discussed the role of comfort bites and sips and that we would allow that if patient was full comfort only.  Phillip Klein has been in contact with patient's family members to discuss his poor prognosis.  Answered questions regarding code status- discussed that full code would not be in line with transition to full  comfort.  At close of discussion- Phillip Klein stated she was visiting [employer] and wished to discuss above information with him.  I encouraged Phillip Klein to consider that Phillip Klein is likely not comprehending these discussions as he is unable to retain any information even for a short amount of time. We discussed that as HCPOA- she is the decision maker/consent giver.     Review of Systems  Unable to perform ROS: Acuity of condition    Length of Stay: 34  Current Medications: Scheduled Meds:  . carvedilol  12.5 mg Per Tube BID WC  . chlorhexidine gluconate (MEDLINE KIT)  15 mL Mouth Rinse BID  .  Chlorhexidine Gluconate Cloth  6 each Topical Daily  . Chlorhexidine Gluconate Cloth  6 each Topical Q0600  . Chlorhexidine Gluconate Cloth  6 each Topical Q0600  . clonazePAM  0.5 mg Per Tube BID  . darbepoetin (ARANESP) injection - NON-DIALYSIS  60 mcg Subcutaneous Q Mon-1800  . feeding supplement (PROSource TF)  45 mL Per Tube 5 X Daily  . fentaNYL  1 patch Transdermal Q72H   And  . fentaNYL  1 patch Transdermal Q72H  . heparin injection (subcutaneous)  5,000 Units Subcutaneous Q8H  . levothyroxine  125 mcg Per Tube Q0600  . mouth rinse  15 mL Mouth Rinse 10 times per day  . nutrition supplement (JUVEN)  1 packet Per Tube BID BM  . pantoprazole sodium  40 mg Per Tube Daily  . sodium chloride flush  10-40 mL Intracatheter Q12H    Continuous Infusions: . sodium chloride 10 mL/hr at 06/02/20 1000  . feeding supplement (OSMOLITE 1.5 CAL) 1,000 mL (06/02/20 0536)    PRN Meds: sodium chloride, acetaminophen, albuterol, alteplase, docusate, fentaNYL (SUBLIMAZE) injection, heparin, hydrALAZINE, iohexol, lidocaine (PF), lidocaine-prilocaine, LORazepam, oxyCODONE, pentafluoroprop-tetrafluoroeth, polyethylene glycol, prochlorperazine, sodium chloride flush  Physical Exam Vitals and nursing note reviewed.  Constitutional:      General: He is not in acute distress.    Appearance: He is ill-appearing and  toxic-appearing.     Comments: cachectic  Neurological:     Comments: lethargic             Vital Signs: BP 132/86   Pulse 63   Temp (!) 97.5 F (36.4 C) (Oral)   Resp 15   Ht 6' 2" (1.88 m)   Wt 105.7 kg   SpO2 96%   BMI 29.92 kg/m  SpO2: SpO2: 96 % O2 Device: O2 Device: Ventilator O2 Flow Rate: O2 Flow Rate (L/min): 60 L/min  Intake/output summary:   Intake/Output Summary (Last 24 hours) at 06/02/2020 1542 Last data filed at 06/02/2020 1327 Gross per 24 hour  Intake 940.06 ml  Output 1520 ml  Net -579.94 ml   LBM: Last BM Date: 06/02/20 Baseline Weight: Weight: 107.5 kg Most recent weight: Weight: 105.7 kg       Palliative Assessment/Data: PPS: 10%   Flowsheet Rows   Flowsheet Row Most Recent Value  Intake Tab   Referral Department Hospitalist  Unit at Time of Referral ICU  Palliative Care Primary Diagnosis Sepsis/Infectious Disease  Date Notified 05/12/20  Palliative Care Type New Palliative care  Reason for referral Clarify Goals of Care  Date of Admission 05/01/2020  Date first seen by Palliative Care 05/13/20  # of days Palliative referral response time 1 Day(s)  # of days IP prior to Palliative referral 13  Clinical Assessment   Psychosocial & Spiritual Assessment   Palliative Care Outcomes       Patient Active Problem List   Diagnosis Date Noted  . MRSA Pneumonia 05/26/2020  . Severe malnutrition (HCC)   . Anasarca   . Acute renal failure superimposed on stage 3b chronic kidney disease (HCC)   . Sinus arrest   . Pacemaker   . HFrEF (heart failure with reduced ejection fraction) (HCC)   . Anemia   . Jejunostomy tube leak (HCC)   . Pleural effusion   . Quadriplegia (HCC) 05/01/2020  . Gram-negative bacteremia 05/01/2020  . Infection due to ESBL-producing [name] pneumoniae 05/01/2020  . Chronic hepatitis C without hepatic coma (HCC) 05/01/2020  . Encounter for feeding tube placement   .   Hypotension   . Pressure injury of skin 04/30/2020   . Malnutrition of moderate degree 04/30/2020  . 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 Palliative medicine consulted for goals of care as patient was refusing dressing changes and wound care and other care interventions.  Assessment/Recommendations/Plan   [name] to visit patient tonight  PMT will followup with [name] tomorrow  Goals of Care and Additional Recommendations:  Limitations on Scope of Treatment: Full Scope Treatment- considering comfort measures  Code Status:  Full code  Prognosis:   < 2 weeks  Discharge Planning:  To Be Determined  Care plan was discussed with HCPOA- [name] and care team  Thank you for allowing the Palliative Medicine Team to assist in the care of this patient.   Total time: 42 minutes Greater than 50%  of this time was spent counseling and coordinating care related to the above assessment and plan.   , AGNP-C Palliative Medicine   Please contact Palliative Medicine Team phone at [phone] for questions and concerns.        

## 2020-06-02 NOTE — Progress Notes (Addendum)
[facility] NEPHROLOGY PROGRESS NOTE  Assessment/ Plan:  # Acute kidney Injury: Secondary to ischemic ATN from pressor requiring septic shock (with background history of chronic hypotension).   Started dialysis for fluid overload refractory to IV diuretics and azotemia on 3/20. Plan for time limited trial of dialysis which continued until 3/24.  His renal function stabilized and UOP picked up some so dialysis has been held.  Unfortunately his renal recovery has stagnated and actually gotten a bit worse over the past few days.  He remains nonoliguric.  His BUN is in the 180s and he's having some nausea today which may be a uremic symptom. Unfortunately the fluid challenge yesterday did not improve renal function and I don't see other modifiable issue here.   I have called to d/w HCPOA [name] yesterday.  She is aware that patient is not a candidate for long-term outpatient dialysis because of very poor functional status and multiple comorbidities. I believe dialysis/renal replacement therapy will not improve his quality of life.  She asked many questions about other systems such as if he can wean from ventilator, overall improve,  Etc.  We made a plan for her to check in with other providers re: prognosis and we would check in later today. I will engage palliative care again.    # Anemia with intermittent bleeding from wounds: transfuse prn <7.  # Severe protein calorie malnutrition: Ongoing supplemental nutrition/tube feeds.  # History of diastolic heart failure/anasarca: He has significant anasarca in part from his protein calorie malnutrition/low albumin and associated third spacing.     # Chronic ventilator dependent respiratory failure, quadriplegia and multiple pressure wounds/ulcers: With ongoing management which he is now refusing some interventions for.   #hyperkalemia: lokelma 10g now  Addendum:  Spoke to [place] on phone re: continued decline in renal function, inability to  liberate from ventilator, severe malnutrition,etc all developing despite maximal support and it's time to think about switching gears to more comfort based care.  She's receptive and wishes to hear from palliative care about more details.  Palliative is aware and plans to call her today.    Subjective: Seen and examined in ICU. No acute events but refusing care overnight including bath and dressing changes. Urine output 333m yesterday from 575m  + nausea. No other complaints.   Objective Vital signs in last 24 hours: Vitals:   06/02/20 0300 06/02/20 0400 06/02/20 0500 06/02/20 0600  BP: 140/80 136/87 (!) 157/80 (!) 147/84  Pulse: 65 71 70 72  Resp: _0 Temp:   97.7 F (36.5 C)   TempSrc:   Oral   SpO2: 97% 96% 97% 96%  Weight:      Height:       Weight change:   Intake/Output Summary (Last 24 hours) at 06/02/2020 0635 Last data filed at 06/01/2020 1807 Gross per 24 hour  Intake 2309.84 ml  Output 1195 ml  Net 1114.84 ml       Labs: Basic Metabolic Panel: Recent Labs  Lab 05/31/20 0530 06/01/20 0345 06/01/20 1000 06/02/20 0430  NA 140 141  --  141  K 4.6 5.2* 5.3* 5.4*  CL 105 106  --  107  CO2 25 23  --  23  GLUCOSE 141* 125*  --  125*  BUN 166* 182*  --  188*  CREATININE 2.33* 2.48*  --  2.55*  CALCIUM 9.4 9.3  --  9.2  PHOS 6.8* 7.0*  --  7.4*   Liver  Function Tests: Recent Labs  Lab 05/27/20 0630 05/28/20 0300 05/31/20 0530 06/01/20 0345 06/02/20 0430  AST 19  --   --   --   --   ALT 14  --   --   --   --   ALKPHOS 214*  --   --   --   --   BILITOT 1.0  --   --   --   --   PROT 7.2  --   --   --   --   ALBUMIN 2.6*   < > 2.3* 2.2* 2.1*   < > = values in this interval not displayed.   No results for input(s): LIPASE, AMYLASE in the last 168 hours. No results for input(s): AMMONIA in the last 168 hours. CBC: Recent Labs  Lab 05/27/20 1040 05/28/20 0300 05/31/20 0530 06/01/20 0345 06/02/20 0430  WBC 7.6  --   --   --   --   HGB  7.6*   < > 7.5* 7.2* 7.5*  HCT 24.6*   < > 24.9* 23.0* 24.5*  MCV 93.5  --   --   --   --   PLT 139*  --   --   --   --    < > = values in this interval not displayed.   Cardiac Enzymes: No results for input(s): CKTOTAL, CKMB, CKMBINDEX, TROPONINI in the last 168 hours. CBG: Recent Labs  Lab 06/01/20 0610 06/01/20 1119 06/01/20 1711 06/01/20 1945 06/01/20 2345  GLUCAP 121* 100* 121* 103* 121*    Iron Studies: No results for input(s): IRON, TIBC, TRANSFERRIN, FERRITIN in the last 72 hours. Studies/Results: No results found.  Medications: Infusions: . sodium chloride 10 mL/hr at 05/28/20 1100  . sodium chloride 125 mL/hr at 06/02/20 0525  . feeding supplement (OSMOLITE 1.5 CAL) 1,000 mL (06/02/20 0536)    Scheduled Medications: . carvedilol  12.5 mg Per Tube BID WC  . chlorhexidine gluconate (MEDLINE KIT)  15 mL Mouth Rinse BID  . Chlorhexidine Gluconate Cloth  6 each Topical Daily  . Chlorhexidine Gluconate Cloth  6 each Topical Q0600  . Chlorhexidine Gluconate Cloth  6 each Topical Q0600  . clonazePAM  0.5 mg Per Tube BID  . darbepoetin (ARANESP) injection - NON-DIALYSIS  60 mcg Subcutaneous Q Mon-1800  . feeding supplement (PROSource TF)  45 mL Per Tube 5 X Daily  . fentaNYL  1 patch Transdermal Q72H   And  . fentaNYL  1 patch Transdermal Q72H  . heparin injection (subcutaneous)  5,000 Units Subcutaneous Q8H  . levothyroxine  125 mcg Per Tube Q0600  . mouth rinse  15 mL Mouth Rinse 10 times per day  . multivitamin with minerals  1 tablet Per Tube Daily  . nutrition supplement (JUVEN)  1 packet Per Tube BID BM  . pantoprazole sodium  40 mg Per Tube Daily  . sodium chloride flush  10-40 mL Intracatheter Q12H  . sodium zirconium cyclosilicate  10 g Per Tube Once    have reviewed scheduled and prn medications.  Physical Exam: General: Elderly male, ill looking sitting up in bed.  On vent via tracheostomy  Heart:RRR, s1s2 nl Lungs: unlabored, +trach Abdomen:soft,  Non-tender, dressing present, G tube intact Extremities: anasarca present Neurology: alert awake, interactive (mouths words), follows commands Dialysis Access: Right IJ temporary HD catheter site clean.   A  06/02/2020,6:35 AM  LOS: 34 days   

## 2020-06-03 DIAGNOSIS — I5020 Unspecified systolic (congestive) heart failure: Secondary | ICD-10-CM | POA: Diagnosis not present

## 2020-06-03 DIAGNOSIS — A4902 Methicillin resistant Staphylococcus aureus infection, unspecified site: Secondary | ICD-10-CM | POA: Diagnosis not present

## 2020-06-03 DIAGNOSIS — A4159 Other Gram-negative sepsis: Secondary | ICD-10-CM | POA: Diagnosis not present

## 2020-06-03 DIAGNOSIS — R601 Generalized edema: Secondary | ICD-10-CM | POA: Diagnosis not present

## 2020-06-03 DIAGNOSIS — I959 Hypotension, unspecified: Secondary | ICD-10-CM | POA: Diagnosis not present

## 2020-06-03 LAB — RENAL FUNCTION PANEL
Albumin: 2 g/dL — ABNORMAL LOW (ref 3.5–5.0)
Anion gap: 10 (ref 5–15)
BUN: 192 mg/dL — ABNORMAL HIGH (ref 6–20)
CO2: 21 mmol/L — ABNORMAL LOW (ref 22–32)
Calcium: 9 mg/dL (ref 8.9–10.3)
Chloride: 110 mmol/L (ref 98–111)
Creatinine, Ser: 2.53 mg/dL — ABNORMAL HIGH (ref 0.61–1.24)
GFR, Estimated: 30 mL/min — ABNORMAL LOW (ref >60)
Glucose, Bld: 108 mg/dL — ABNORMAL HIGH (ref 70–99)
Phosphorus: 7.9 mg/dL — ABNORMAL HIGH (ref 2.5–4.6)
Potassium: 5.8 mmol/L — ABNORMAL HIGH (ref 3.5–5.1)
Sodium: 141 mmol/L (ref 135–145)

## 2020-06-03 LAB — GLUCOSE, CAPILLARY: Glucose-Capillary: 96 mg/dL (ref 70–99)

## 2020-06-03 MED ORDER — LORAZEPAM 2 MG/ML IJ SOLN
1.0000 mg | Freq: Four times a day (QID) | INTRAMUSCULAR | Status: DC | PRN
  Administered 2020-06-06: 1 mg via INTRAVENOUS
  Filled 2020-06-03: qty 1

## 2020-06-03 MED ORDER — GLYCOPYRROLATE 1 MG PO TABS: 1.0000 mg | ORAL_TABLET | ORAL | Status: DC | PRN

## 2020-06-03 MED ORDER — CLONAZEPAM 0.5 MG PO TBDP
0.5000 mg | ORAL_TABLET | Freq: Three times a day (TID) | ORAL | Status: DC
  Administered 2020-06-03 – 2020-06-06 (×9): 0.5 mg
  Filled 2020-06-03 (×9): qty 1

## 2020-06-03 MED ORDER — GLYCOPYRROLATE 0.2 MG/ML IJ SOLN: 0.2000 mg | INTRAMUSCULAR | Status: DC | PRN

## 2020-06-03 MED ORDER — TRAZODONE HCL 50 MG PO TABS
100.0000 mg | ORAL_TABLET | Freq: Every day | ORAL | Status: DC
  Administered 2020-06-03 – 2020-06-06 (×4): 100 mg
  Filled 2020-06-03 (×4): qty 2

## 2020-06-03 MED ORDER — ONDANSETRON HCL 4 MG/2ML IJ SOLN
4.0000 mg | Freq: Three times a day (TID) | INTRAMUSCULAR | Status: DC
  Administered 2020-06-03 – 2020-06-06 (×10): 4 mg via INTRAVENOUS
  Filled 2020-06-03 (×11): qty 2

## 2020-06-03 MED ORDER — FENTANYL CITRATE (PF) 100 MCG/2ML IJ SOLN
75.0000 ug | INTRAMUSCULAR | Status: DC | PRN
Start: 2020-06-03 — End: 2020-06-07
  Administered 2020-06-03 – 2020-06-06 (×8): 75 ug via INTRAVENOUS
  Filled 2020-06-03 (×8): qty 2

## 2020-06-03 MED ORDER — SERTRALINE HCL 50 MG PO TABS
100.0000 mg | ORAL_TABLET | Freq: Every day | ORAL | Status: DC
  Administered 2020-06-03 – 2020-06-06 (×4): 100 mg
  Filled 2020-06-03 (×4): qty 2

## 2020-06-03 MED ORDER — SODIUM ZIRCONIUM CYCLOSILICATE 5 G PO PACK
10.0000 g | PACK | Freq: Once | ORAL | Status: AC
  Administered 2020-06-03: 10 g
  Filled 2020-06-03: qty 2

## 2020-06-03 NOTE — Progress Notes (Addendum)
PROGRESS NOTE    Phillip Klein  TIW:[id number] DOB: [birth year] DOA: 04/13/2020 PCP: [name], MD    Brief Narrative: This 55 years old male with PMH significant for chronic hypoxic respiratory failure with tracheostomy and vent since 2021 after C-spine surgery, GERD, DM, anemia of chronic disease, quadriplegia, malnutrition, neurogenic orthostatic hypotension, untreated HCV, previous IV drug use, cirrhosis, chronic pain syndrome was sent from [facility] with septic shock. Patient admitted in the ICU with septic shock secondary to ESBL/ [name] bacteremia. Patient had Hickman's catheter which was removed, presumed to be the source of infection. Patient has completed meropenem for 10 days after removal of the catheter. Patient has developed acute kidney injury secondary to ischemic ATN from pressors requiring septic shock.,Cardiology,Nephrology is following. PCCM is assisting with vent management. Patient had multiple chronic bedsores,wound care is following. Palliative care was consulted to discuss goals of care.  Patient eventually started on temporary hemodialysis.  Patient is not a candidate for long-term outpatient dialysis because of poor functional status and multiple comorbidities. Family wanted full scope of treatment initially.  Patient is developing uremic symptoms, poor candidate for further dialysis.  He is vent dependent, previous trials at weaning him from ventilator were unsuccessful.  Palliative care is to readdress goals of care.  Plan: Full comfort care related to renal function, no more dialysis, no IV fluids or antibiotics, No symptoms management from renal failure but to continue ventilation and tube feeding until he becomes unresponsive then will remove vent.  Assessment & Plan:   Principal Problem:   MRSA Pneumonia Active Problems:   Septic shock ([place])   Pressure injury of skin   Malnutrition of moderate degree   Encounter for feeding tube  placement   Hypotension   Quadriplegia (HCC)   Gram-negative bacteremia   Infection due to ESBL-producing [name] pneumoniae   Chronic hepatitis C without hepatic coma (HCC)   Anemia   Jejunostomy tube leak (HCC)   Pleural effusion   Sinus arrest   Pacemaker   HFrEF (heart failure with reduced ejection fraction) (HCC)   Severe malnutrition (HCC)   Anasarca   Acute renal failure superimposed on stage 3b chronic kidney disease (HCC)   Septic shock, POA secondary to [name] bacteremia, UTI versus line infection  -Hickman catheter presumed to be the source of infection, removed. -Blood cultures + [name] bacteremia, UA positive, culture showed multiple species -Completed meropenem for 10 days on 05/12/2020, repeat blood cultures negative -PICC line inserted on 3/8, venous Doppler showed no DVT. -Sepsis physiology resolved.   Active problems Acute kidney injury sec. to ischemic ATN, septic shock requiring pressors, chronic hypotension, anasarca -Patient was placed on albumin and Lasix for anasarca, per nephrology has not been effective. -Nephrology states not a candidate for long-term dialysis,  however may be an option for management of severe azotemia. -Palliative medicine consulted, Patient and family wants to have full scope of treatment. -05/22/20: Hemodialysis is planned -Patient underwent hemodialysis 3/20, 3/21, 3/22 and 3/24 with 2 L UF.  Hold onto further dialysis as per nephrology. -Monitor for renal recovery. BUN is trending up. -Nephrologist had detailed discussion with the patient about hemodialysis.  Patient understood that getting dialysis 3 times a week will not going to improve his quality of life nor will improve some of his comorbidities. His kidney functions remained stable.  We will continue to monitor for renal recovery. -If no improvement in renal functions, may need to readdress goals of care discussion. -Now he is showing signs of uremia (  Nausea  ) -Palliative care reconsulted to readdress goals of care discussion. -Full comfort care related to renal function, no more dialysis, no IV fluids or antibiotics, manage symptoms from renal failure but to continue ventilation and tube feeding until he becomes unresponsive then will remove vent.   Acute blood loss anemia with intermittent bleeding from wounds : -Platelet dysfunction in the setting of severe azotemia. -Nephrology recommended the conjugated estrogen (Premarin) via tube for 5 days, completed 3/20. -Hemoglobin again down to 6.7, received 2 units PRBC -Hemoglobin is 8 g/dL > 9.6 g/dl > 7.6 > 7.7> 7.8> 8.0> 7.5 >7.2  Essential hypertension Continue Coreg, hydralazine IV as needed with parameters. Continue coreg 12.5 bid .  Acute on chronic combined systolic and diastolic CHF exacerbation with anasarca: -Other contributers include severe protein calorie malnutrition with hypoalbuminemia and third spacing -2D echo showed EF of 40 to 45% with global hypokinesis -Nephrology following, treated with albumin/furosemide, fluid restriction, considered short-term hemodialysis -Patient had hemodialysis on 3/20, 3/21, 3/22, 3/24.  Monitor for renal recovery.  Chronic ventilator dependent respiratory failure, quadriplegia -Management per CCM, continue ventilatory support with trach. -Interventional radiology team has been consulted to replace G tube. -Patient underwent tracheostomy tube change 3/21. -GJ tube changed 3/23. -Continue feeding through tube feed. -Patient is ventilator dependent due to respiratory muscle weakness.  Orthostatic hypotension> Resolved. Florinef was discontinued in ICU due to concern for fluid retention, midodrine has been stopped. BP elevated, started coreg.  Chronic pain syndrome -Currently stable, continue fentanyl patch, oxycodone  Depression, insomnia Continue sertraline, trazodone, clonazepam  GERD Continue PPI  Hematuria -resolved,  urology was consulted, no need for any acute intervention.  Hypothermia:  -Patient was pancultured, cultures no growth so far. -Hold on antibiotics. He just completed meropenum x 10 days. -Continue warming blanket. -ID consulted, MRSA +, chest x-ray consistent with pneumonia -ID recommended Linezolid, completed for 7 days.  Multiple pressure wounds, present on admission -Full-thickness vertebral column stage IV -Sacrum stage IV, -Right and left ischial tuberosity, stage IV -Right lateral hip, stage IV -Right and left lower leg lateral, unstageable -Right (unstageable now) and left posterior elbow, stage III - left posterior lower arm, unstageable   05/23/2020: Patient has been refusing wound care/dressing.  Obesity Estimated body mass index is 30.43 kg/m as calculated from the following:   Height as of this encounter: '6\' 2"'  (1.88 m).   Weight as of this encounter: 107.5 kg.   DVT prophylaxis:  SCDs Code Status: Full code. Family Communication: Spoke with wife [name] on the phone.  Explained in detail that patient has poor prognosis, He is showing signs of uremia and he is not a candidate for long-term hemodialysis,  He is ventilator dependent,  previous attempts at weaning him down were unsuccessful.  Disposition Plan:  Status is: Inpatient  Remains inpatient appropriate because:Inpatient level of care appropriate due to severity of illness   Dispo: The patient is from: SNF              Anticipated d/c is to: Inpatient or residential hospice.              Patient currently is not medically stable to d/c.   Difficult to place patient Yes   Full comfort care related to renal function, no more dialysis, no IV fluids or antibiotics, manage symptoms from renal failure but to continue ventilation and tube feeding until he becomes unresponsive then will remove vent.     Consultants:   [facility]  Cardiology  Nephrology  IR  Palliative care  Procedures: Tracheostomy  change, GJ tube change, RIJ central line.   Antimicrobials: Anti-infectives (From admission, onward)   Start     Dose/Rate Route Frequency Ordered Stop   05/27/20 2000  linezolid (ZYVOX) 100 MG/5ML suspension 600 mg  Status:  Discontinued        600 mg Per Tube Every 12 hours 05/27/20 0838 05/27/20 1130   05/27/20 2000  linezolid (ZYVOX) tablet 600 mg        600 mg Per Tube Every 12 hours 05/27/20 1130 06/01/20 0925   05/25/20 1845  linezolid (ZYVOX) IVPB 600 mg  Status:  Discontinued        600 mg 300 mL/hr over 60 Minutes Intravenous Every 12 hours 05/25/20 1748 05/27/20 0838   05/19/20 1000  fluconazole (DIFLUCAN) tablet 50 mg       "Followed by" Linked Group Details   50 mg Oral Daily 05/18/20 1048 05/27/20 0929   05/18/20 1145  fluconazole (DIFLUCAN) tablet 100 mg       "Followed by" Linked Group Details   100 mg Oral  Once 05/18/20 1048 05/18/20 1145   05/11/20 0800  meropenem (MERREM) 1 g in sodium chloride 0.9 % 100 mL IVPB        1 g 200 mL/hr over 30 Minutes Intravenous Every 8 hours 05/11/20 0707 05/12/20 1555   05/11/20 0715  meropenem (MERREM) 1 g in sodium chloride 0.9 % 100 mL IVPB  Status:  Discontinued        1 g 200 mL/hr over 30 Minutes Intravenous Every 8 hours 05/11/20 0706 05/11/20 0707   05/03/20 2200  meropenem (MERREM) 1 g in sodium chloride 0.9 % 100 mL IVPB  Status:  Discontinued        1 g 200 mL/hr over 30 Minutes Intravenous Every 12 hours 05/03/20 1041 05/11/20 0706   04/30/20 1145  meropenem (MERREM) 2 g in sodium chloride 0.9 % 100 mL IVPB  Status:  Discontinued        2 g 200 mL/hr over 30 Minutes Intravenous Every 12 hours 04/30/20 1052 05/03/20 1041   04/30/20 0530  ceFEPIme (MAXIPIME) 2 g in sodium chloride 0.9 % 100 mL IVPB  Status:  Discontinued        2 g 200 mL/hr over 30 Minutes Intravenous Every 12 hours 04/09/2020 1852 04/30/20 1051   04/20/2020 1852  vancomycin variable dose per unstable renal function (pharmacist dosing)  Status:   Discontinued         Does not apply See admin instructions 04/30/2020 1852 04/30/20 1051   04/18/2020 1630  vancomycin (VANCOREADY) IVPB 2000 mg/400 mL        2,000 mg 200 mL/hr over 120 Minutes Intravenous  Once 04/20/2020 1621 04/27/2020 2008   04/13/2020 1630  ceFEPIme (MAXIPIME) 2 g in sodium chloride 0.9 % 100 mL IVPB        2 g 200 mL/hr over 30 Minutes Intravenous  Once 04/18/2020 1621 04/28/2020 1749     Subjective: Patient was seen and examined at bedside.  Overnight events noted. Patient is swollen with scrotal edema and generalized anasarca.  Colostomy bag on left abdomen. He communicates by mouthing words, difficult to understand sometimes.  He appears more lethargic today. Palliative care had discussion with the family and decision is made,  patient would be  comfort care. continue ventilation and tube feeding until he becomes unresponsive then will remove vent.   Objective: Vitals:   06/03/20 0500 06/03/20 0700  06/03/20 0831 06/03/20 1122  BP: 116/61  135/71   Pulse: 80  71   Resp: (!) 21  (!) 22   Temp:  (!) 96.4 F (35.8 C)    TempSrc:  Axillary    SpO2: 98%  94% 97%  Weight:      Height:        Intake/Output Summary (Last 24 hours) at 06/03/2020 1345 Last data filed at 06/03/2020 0000 Gross per 24 hour  Intake 760 ml  Output 325 ml  Net 435 ml   Filed Weights   05/28/20 0500 06/01/20 0352 06/03/20 0415  Weight: 103.8 kg 105.7 kg 106.3 kg    Examination:  General exam: Appears calm and comfortable, not in any distress.  HEENT: Tracheostomy tube connected with ventilator, vent dependent. RIJ central line, no signs of infection. Respiratory system: Clear to auscultation. Respiratory effort normal. Cardiovascular system: S1 & S2 heard, RRR. No JVD, murmurs, rubs, gallops or clicks. +++ pedal edema. Gastrointestinal system: Abdomen is nondistended, soft and nontender. No organomegaly or masses felt.   Normal bowel sounds heard. Colostomy bag on left. Central nervous  system: Alert but confused.. No focal neurological deficits. Extremities: Bilateral heal protectors, Pitting edema, Anasarca. Skin: Multiple pressure ulcers in multiple locations. Psychiatry: Judgement and insight appear normal. Mood & affect appropriate.     Data Reviewed: I have personally reviewed following labs and imaging studies  CBC: Recent Labs  Lab 05/29/20 0300 05/30/20 0242 05/31/20 0530 06/01/20 0345 06/02/20 0430  HGB 7.8* 8.0* 7.5* 7.2* 7.5*  HCT 24.7* 25.3* 24.9* 23.0* 15.0*   Basic Metabolic Panel: Recent Labs  Lab 05/30/20 0241 05/30/20 0242 05/31/20 0530 06/01/20 0345 06/01/20 1000 06/02/20 0430 06/03/20 0350  NA 139 138 140 141  --  141 141  K 4.1 4.0 4.6 5.2* 5.3* 5.4* 5.8*  CL 105 104 105 106  --  107 110  CO2 '25 23 25 23  ' --  23 21*  GLUCOSE 155* 154* 141* 125*  --  125* 108*  BUN 160* 154* 166* 182*  --  188* 192*  CREATININE 2.27* 2.26* 2.33* 2.48*  --  2.55* 2.53*  CALCIUM 9.2 9.3 9.4 9.3  --  9.2 9.0  MG  --   --  2.0  --   --  2.0  --   PHOS 7.3*  --  6.8* 7.0*  --  7.4* 7.9*   GFR: Estimated Creatinine Clearance: 44.3 mL/min (A) (by C-G formula based on SCr of 2.53 mg/dL (H)). Liver Function Tests: Recent Labs  Lab 05/30/20 0241 05/31/20 0530 06/01/20 0345 06/02/20 0430 06/03/20 0350  ALBUMIN 2.4* 2.3* 2.2* 2.1* 2.0*   No results for input(s): LIPASE, AMYLASE in the last 168 hours. No results for input(s): AMMONIA in the last 168 hours. Coagulation Profile: No results for input(s): INR, PROTIME in the last 168 hours. Cardiac Enzymes: No results for input(s): CKTOTAL, CKMB, CKMBINDEX, TROPONINI in the last 168 hours. BNP (last 3 results) No results for input(s): PROBNP in the last 8760 hours. HbA1C: No results for input(s): HGBA1C in the last 72 hours. CBG: Recent Labs  Lab 06/01/20 1945 06/01/20 2345 06/02/20 1104 06/02/20 2347 06/03/20 1215  GLUCAP 103* 121* 104* 100* 96   Lipid Profile: No results for input(s):  CHOL, HDL, LDLCALC, TRIG, CHOLHDL, LDLDIRECT in the last 72 hours. Thyroid Function Tests: No results for input(s): TSH, T4TOTAL, FREET4, T3FREE, THYROIDAB in the last 72 hours. Anemia Panel: No results for input(s): VITAMINB12, FOLATE, FERRITIN, TIBC, IRON,  RETICCTPCT in the last 72 hours. Sepsis Labs: No results for input(s): PROCALCITON, LATICACIDVEN in the last 168 hours.  No results found for this or any previous visit (from the past 240 hour(s)).  Radiology Studies: No results found. Scheduled Meds: . carvedilol  12.5 mg Per Tube BID WC  . chlorhexidine gluconate (MEDLINE KIT)  15 mL Mouth Rinse BID  . Chlorhexidine Gluconate Cloth  6 each Topical Daily  . Chlorhexidine Gluconate Cloth  6 each Topical Q0600  . Chlorhexidine Gluconate Cloth  6 each Topical Q0600  . clonazePAM  0.5 mg Per Tube BID  . feeding supplement (PROSource TF)  45 mL Per Tube 5 X Daily  . fentaNYL  1 patch Transdermal Q72H   And  . fentaNYL  1 patch Transdermal Q72H  . levothyroxine  125 mcg Per Tube Q0600  . mouth rinse  15 mL Mouth Rinse 10 times per day  . pantoprazole sodium  40 mg Per Tube Daily  . sertraline  100 mg Per Tube Daily  . sodium chloride flush  10-40 mL Intracatheter Q12H  . traZODone  100 mg Per Tube QHS   Continuous Infusions: . sodium chloride 10 mL/hr at 06/02/20 1000  . feeding supplement (OSMOLITE 1.5 CAL) 1,000 mL (06/03/20 0533)     LOS: 35 days    Time spent: 25 mins    Teriah Muela, MD [facility]   If 7PM-7AM, please contact night-coverage

## 2020-06-03 NOTE — TOC Progression Note (Addendum)
Transition of Care [facility]) - Progression Note    Patient Details  Name: Kingson Lohmeyer MRN: [id number] Date of Birth: 1965-06-30  [facility] CM/SW Contact  Orlanda Frankum [place]is [place], [place] Phone Number: 06/03/2020, 2:13 PM  Clinical Narrative:     CSW received message from [facility]iative informing CSW of pt's families goals. Pt will need placement that includes 24/7 trach and skilled nursing care as well as hospice services.   CSW called pt's POA Inetta Fermo and informed her of limitations of pt's insurance and facilities available. CSW explained that medicare will not cover SNF and Hospice at the same time. CSW also explained that there are likely no inpatient hospice facilities that can manage pt's trach care. Inetta Fermo reports that she spoke with a facility in [facility] that she things may offer trach care. She already notified palliative team who is calling to follow up about [place] services. Inetta Fermo also reports that pt is already in process of applying for medicaid that would cover long term care but she is not sure of the status of this. CSW encourages her to follow up with DSS to find out progress in pt's medicaid application. [name]'s primary goal is for pt to be closer to [place] so she can spend more time with him and reports that she would like this even if it meant pt transferring to a different hospital ICU. CSW will continue to follow for disposition options.   1450 CSW is notified by [facility] that they contacted [facility]  in [phone]) and that they sometimes take patients on vents if there is a plan to discontinue it though that it would be a "slim" chance. CSW to make referral to [facility] pavilion  1523 CSW faxed referral to [facility] intake at [phone]      Expected Discharge Plan: Hospice Medical Facility Barriers to Discharge: Hospice Bed not available (Medicare doesn't cover hospice and SNF at the same time; No  hospice facilities identified that take patients with trach)  Expected Discharge Plan and Services Expected Discharge Plan: Hospice Medical Facility       Living arrangements for the past 2 months: Skilled Nursing Facility                                       Social Determinants of Health (SDOH) Interventions    Readmission Risk Interventions No flowsheet data found.

## 2020-06-03 NOTE — Progress Notes (Signed)
[facility] PROGRESS NOTE  Assessment/ Plan:  # Acute kidney Injury: Secondary to ischemic ATN from pressor requiring septic shock (with background history of chronic hypotension).   Underwent time limited trial of HD 3/20-24 but unfortunately has become progressively azotemic and now oliguric.  He did not respond to a fluid challenge and further fluids held in light of anasarca.  No other modifiable issues have been identified.    Given comorbidities he's not a candidate for long term dialysis treatments.  Spoke to [place] on phone yesterday re: continued decline in renal function, inability to liberate from ventilator, severe malnutrition,etc all developing despite maximal support and it's time to think about switching gears to more comfort based care.  She's receptive and is discussing with palliative care  - appreciate the conversations.  She was to visit last night and follow up with them today.     # Anemia with intermittent bleeding from wounds: transfuse prn <7.  # Severe protein calorie malnutrition: Ongoing supplemental nutrition/tube feeds.  # History of diastolic heart failure/anasarca: He has significant anasarca in part from his protein calorie malnutrition/low albumin and associated third spacing.     # Chronic ventilator dependent respiratory failure, quadriplegia and multiple pressure wounds/ulcers: With ongoing management which he is now refusing some interventions for.   #hyperkalemia: lokelma 10g now  Unfortunately his prognosis at this point is grim and comfort measures are most appropriate.  Appreciate palliative care involvement.  I will follow peripherally as there is not more I can offer.  Please let me know of any concerns.    Subjective: Seen and examined in ICU. No acute events. Urine output 264m yesterday from 7259m  + nausea. No other complaints.   Objective Vital signs in last 24 hours: Vitals:   06/03/20 0415 06/03/20 0500 06/03/20 0700  06/03/20 0831  BP:  116/61  135/71  Pulse:  80  71  Resp:  (!) 21  (!) 22  Temp:   (!) 96.4 F (35.8 C)   TempSrc:   Axillary   SpO2:  98%  94%  Weight: 106.3 kg     Height:       Weight change:   Intake/Output Summary (Last 24 hours) at 06/03/2020 1007 Last data filed at 06/03/2020 0000 Gross per 24 hour  Intake 985 ml  Output 500 ml  Net 485 ml       Labs: Basic Metabolic Panel: Recent Labs  Lab 06/01/20 0345 06/01/20 1000 06/02/20 0430 06/03/20 0350  NA 141  --  141 141  K 5.2* 5.3* 5.4* 5.8*  CL 106  --  107 110  CO2 23  --  23 21*  GLUCOSE 125*  --  125* 108*  BUN 182*  --  188* 192*  CREATININE 2.48*  --  2.55* 2.53*  CALCIUM 9.3  --  9.2 9.0  PHOS 7.0*  --  7.4* 7.9*   Liver Function Tests: Recent Labs  Lab 06/01/20 0345 06/02/20 0430 06/03/20 0350  ALBUMIN 2.2* 2.1* 2.0*   No results for input(s): LIPASE, AMYLASE in the last 168 hours. No results for input(s): AMMONIA in the last 168 hours. CBC: Recent Labs  Lab 05/27/20 1040 05/28/20 0300 05/31/20 0530 06/01/20 0345 06/02/20 0430  WBC 7.6  --   --   --   --   HGB 7.6*   < > 7.5* 7.2* 7.5*  HCT 24.6*   < > 24.9* 23.0* 24.5*  MCV 93.5  --   --   --   --  PLT 139*  --   --   --   --    < > = values in this interval not displayed.   Cardiac Enzymes: No results for input(s): CKTOTAL, CKMB, CKMBINDEX, TROPONINI in the last 168 hours. CBG: Recent Labs  Lab 06/01/20 1711 06/01/20 1945 06/01/20 2345 06/02/20 1104 06/02/20 2347  GLUCAP 121* 103* 121* 104* 100*    Iron Studies: No results for input(s): IRON, TIBC, TRANSFERRIN, FERRITIN in the last 72 hours. Studies/Results: No results found.  Medications: Infusions: . sodium chloride 10 mL/hr at 06/02/20 1000  . feeding supplement (OSMOLITE 1.5 CAL) 1,000 mL (06/03/20 0533)    Scheduled Medications: . carvedilol  12.5 mg Per Tube BID WC  . chlorhexidine gluconate (MEDLINE KIT)  15 mL Mouth Rinse BID  . Chlorhexidine Gluconate  Cloth  6 each Topical Daily  . Chlorhexidine Gluconate Cloth  6 each Topical Q0600  . Chlorhexidine Gluconate Cloth  6 each Topical Q0600  . clonazePAM  0.5 mg Per Tube BID  . darbepoetin (ARANESP) injection - NON-DIALYSIS  60 mcg Subcutaneous Q Mon-1800  . feeding supplement (PROSource TF)  45 mL Per Tube 5 X Daily  . fentaNYL  1 patch Transdermal Q72H   And  . fentaNYL  1 patch Transdermal Q72H  . heparin injection (subcutaneous)  5,000 Units Subcutaneous Q8H  . levothyroxine  125 mcg Per Tube Q0600  . mouth rinse  15 mL Mouth Rinse 10 times per day  . nutrition supplement (JUVEN)  1 packet Per Tube BID BM  . pantoprazole sodium  40 mg Per Tube Daily  . sertraline  100 mg Per Tube Daily  . sodium chloride flush  10-40 mL Intracatheter Q12H  . traZODone  100 mg Per Tube QHS    have reviewed scheduled and prn medications.  Physical Exam: General: Elderly male, ill looking sitting up in bed.  On vent via tracheostomy  Heart:RRR, s1s2 nl Lungs: unlabored, +trach Abdomen:soft, Non-tender, dressing present, G tube intact Extremities: anasarca present Neurology: alert awake, interactive (mouths words), follows commands  Justin Mend 06/03/2020,10:07 AM  LOS: 35 days

## 2020-06-03 NOTE — Progress Notes (Signed)
Daily Progress Note   Patient Name: Phillip Klein       Date: 06/03/2020 DOB: [birth year]  Age: 55 y.o. MRN#: [id number] Attending Physician: Shawna Clamp, MD Primary Care Physician: [name], MD Admit Date: 04/07/2020  Reason for Consultation/Follow-up: Establishing goals of care  Subjective: Phillip Klein for followup. She met with Phillip Klein yesterday.  She is in agreement with transition to comfort measures in relation to his renal failure and DNR status. Plan for now is to continue ventilator with symptom management for renal failure- volume overload, anxiety, nausea from uremia. No escalation of care.  Phillip Klein first questioned the timing of making the transition to full comfort measures- we discussed that at this point- it would be stopping labs, IV medications, DVT prophylaxis, and maintenance medications and focusing on symptom management with the plan to d/c vent and tube feeding when patient becomes unresponsive or if tube feedings are becoming unmanageable for him. She asked about delaying this change in his plan of care until Sunday so that Phillip Klein's son could visit. I let her know that Phillip Klein is going to continue to decline regardless of current interventions- if it is important for his son to visit he should do it sooner rather than later. However, knowing that we can no longer pursue dialysis in efforts to correct fluid status and electrolytes- it would not be appropriate to delay his symptom management.  Once patient becomes unresponsive- then discontinue ventilator and tube feedings and support Phillip Klein through his final moments with symptom management only. We discussed that comfort measures may include stopping tube feeding before ventilator discontinuation as when someone is at end of  life- they no longer need and are not able to process artificial nutrition.  I met with Phillip Klein. He states he is feeling "rough". He is nauseated, and feels short of breath even on max vent support.  I gently reviewed my discussion with Phillip Klein with Phillip Klein- he nods yes to this with no signs of anxiety or distress. It is not clear how much he truly understands.  Phillip Klein wishes for Phillip Klein to be closer to her in [place]- she has been in contact with Phillip Klein and is requesting a referral.   Length of Stay: 35  Current Medications: Scheduled Meds:  . carvedilol  12.5 mg Per Tube BID WC  .  chlorhexidine gluconate (MEDLINE KIT)  15 mL Mouth Rinse BID  . Chlorhexidine Gluconate Cloth  6 each Topical Daily  . Chlorhexidine Gluconate Cloth  6 each Topical Q0600  . Chlorhexidine Gluconate Cloth  6 each Topical Q0600  . clonazePAM  0.5 mg Per Tube BID  . feeding supplement (PROSource TF)  45 mL Per Tube 5 X Daily  . fentaNYL  1 patch Transdermal Q72H   And  . fentaNYL  1 patch Transdermal Q72H  . levothyroxine  125 mcg Per Tube Q0600  . mouth rinse  15 mL Mouth Rinse 10 times per day  . pantoprazole sodium  40 mg Per Tube Daily  . sertraline  100 mg Per Tube Daily  . sodium chloride flush  10-40 mL Intracatheter Q12H  . traZODone  100 mg Per Tube QHS    Continuous Infusions: . sodium chloride 10 mL/hr at 06/02/20 1000  . feeding supplement (OSMOLITE 1.5 CAL) 1,000 mL (06/03/20 0533)    PRN Meds: sodium chloride, acetaminophen, albuterol, alteplase, docusate, fentaNYL (SUBLIMAZE) injection, hydrALAZINE, lidocaine (PF), lidocaine-prilocaine, LORazepam, oxyCODONE, polyethylene glycol, prochlorperazine, sodium chloride flush  Physical Exam Vitals and nursing note reviewed.  Constitutional:      Appearance: He is ill-appearing.  Cardiovascular:     Comments: Diffuse anasarca Neurological:     Mental Status: He is alert.     Comments: Intermittent lethargy             Vital  Signs: BP 135/71   Pulse 71   Temp (!) 96.4 F (35.8 C) (Axillary) Comment: RN notified  Resp (!) 22   Ht 6' 2" (1.88 m)   Wt 106.3 kg   SpO2 97%   BMI 30.09 kg/m  SpO2: SpO2: 97 % O2 Device: O2 Device: Ventilator O2 Flow Rate: O2 Flow Rate (L/min): 60 L/min  Intake/output summary:   Intake/Output Summary (Last 24 hours) at 06/03/2020 1130 Last data filed at 06/03/2020 0000 Gross per 24 hour  Intake 910 ml  Output 500 ml  Net 410 ml   LBM: Last BM Date: 06/02/20 Baseline Weight: Weight: 107.5 kg Most recent weight: Weight: 106.3 kg       Palliative Assessment/Data: PPS: 10%   Flowsheet Rows   Flowsheet Row Most Recent Value  Intake Tab   Referral Department Hospitalist  Unit at Time of Referral ICU  Palliative Care Primary Diagnosis Sepsis/Infectious Disease  Date Notified 05/12/20  Palliative Care Type New Palliative care  Reason for referral Clarify Goals of Care  Date of Admission 04/16/2020  Date first seen by Palliative Care 05/13/20  # of days Palliative referral response time 1 Day(s)  # of days IP prior to Palliative referral 13  Clinical Assessment   Psychosocial & Spiritual Assessment   Palliative Care Outcomes       Patient Active Problem List   Diagnosis Date Noted  . MRSA Pneumonia 05/26/2020  . Severe malnutrition ([place])   . Anasarca   . Acute renal failure superimposed on stage 3b chronic kidney disease ([place])   . Sinus arrest   . Pacemaker   . HFrEF (heart failure with reduced ejection fraction) ([place])   . Anemia   . Jejunostomy tube leak ([place])   . Pleural effusion   . Quadriplegia ([place]) 05/01/2020  . Gram-negative bacteremia 05/01/2020  . Infection due to ESBL-producing [name] pneumoniae 05/01/2020  . Chronic hepatitis C without hepatic coma ([place]) 05/01/2020  . Encounter for feeding tube placement   . Hypotension   . Pressure  injury of skin 04/30/2020  . Malnutrition of moderate degree 04/30/2020  . 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 Palliative medicine consulted for goals of care as patient was refusing dressing changes and wound care and other care interventions.He is now in renal failure with progressive uremia. CRRT was attempted for renal resuscitation, however, was not successful. He is not a candidate for long term hemodialysis. He has anemia and blood counts are continuing to drop due to renal failure and oozing wounds. He has diffuse anasarca from renal failure and poor albumin status. He is not weaning from ventilator. Now transitioning to comfort measures only.   Assessment/Recommendations/Plan   Stop IV fluids, labs, DVT prophylaxis and maintenance medications  Comfort orders as entered- no escalation in scope of care  [facility] referral for possible location for patient closer to [facility]- this is likely to be complicated as patient is now comfort measures only and will require Hospice care, however, remains on vent (will withdraw vent when patient becomes unresponsive)  Goals of Care and Additional Recommendations:  Limitations on Scope of Treatment: Full Comfort Care  Code Status:  DNR  Prognosis:   < 4 weeks- possibly less than two depending on how patient does over the next 24-48 hours due to his progressing renal failure, malnutrition, heart failure, anasarca, multiple oozing wounds, plan for comfort measures  Discharge Planning:  To Be Determined- [facility]CPOA is hopeful for  a location closer to her in [place]- would need either a long term vent SNF bed with Hospice or an inpatient hospice facility that would accept a patient on a ventilator  Care plan was discussed with HCPOA, patient, and healthcare team  Thank you for allowing the Palliative Medicine Team to assist in the care of this patient.   Total time: 68 minutes  Greater than 50%  of this time was spent counseling and coordinating care related to the above assessment and plan.  Mariana Kaufman, AGNP-C Palliative Medicine   Please contact Palliative Medicine Team phone at [phone] for questions and concerns.

## 2020-06-04 DIAGNOSIS — A4902 Methicillin resistant Staphylococcus aureus infection, unspecified site: Secondary | ICD-10-CM | POA: Diagnosis not present

## 2020-06-04 DIAGNOSIS — J9622 Acute and chronic respiratory failure with hypercapnia: Secondary | ICD-10-CM | POA: Diagnosis not present

## 2020-06-04 DIAGNOSIS — F1120 Opioid dependence, uncomplicated: Secondary | ICD-10-CM | POA: Diagnosis not present

## 2020-06-04 DIAGNOSIS — E43 Unspecified severe protein-calorie malnutrition: Secondary | ICD-10-CM | POA: Diagnosis not present

## 2020-06-04 DIAGNOSIS — G903 Multi-system degeneration of the autonomic nervous system: Secondary | ICD-10-CM | POA: Diagnosis not present

## 2020-06-04 DIAGNOSIS — Z20822 Contact with and (suspected) exposure to covid-19: Secondary | ICD-10-CM | POA: Diagnosis not present

## 2020-06-04 DIAGNOSIS — D62 Acute posthemorrhagic anemia: Secondary | ICD-10-CM | POA: Diagnosis not present

## 2020-06-04 DIAGNOSIS — I5020 Unspecified systolic (congestive) heart failure: Secondary | ICD-10-CM | POA: Diagnosis not present

## 2020-06-04 DIAGNOSIS — N170 Acute kidney failure with tubular necrosis: Secondary | ICD-10-CM | POA: Diagnosis not present

## 2020-06-04 DIAGNOSIS — I82612 Acute embolism and thrombosis of superficial veins of left upper extremity: Secondary | ICD-10-CM | POA: Diagnosis not present

## 2020-06-04 DIAGNOSIS — J9621 Acute and chronic respiratory failure with hypoxia: Secondary | ICD-10-CM | POA: Diagnosis not present

## 2020-06-04 DIAGNOSIS — Z515 Encounter for palliative care: Secondary | ICD-10-CM | POA: Diagnosis not present

## 2020-06-04 DIAGNOSIS — R6521 Severe sepsis with septic shock: Secondary | ICD-10-CM | POA: Diagnosis not present

## 2020-06-04 DIAGNOSIS — I959 Hypotension, unspecified: Secondary | ICD-10-CM | POA: Diagnosis present

## 2020-06-04 DIAGNOSIS — J9610 Chronic respiratory failure, unspecified whether with hypoxia or hypercapnia: Secondary | ICD-10-CM

## 2020-06-04 DIAGNOSIS — J15212 Pneumonia due to Methicillin resistant Staphylococcus aureus: Secondary | ICD-10-CM | POA: Diagnosis not present

## 2020-06-04 DIAGNOSIS — I5043 Acute on chronic combined systolic (congestive) and diastolic (congestive) heart failure: Secondary | ICD-10-CM | POA: Diagnosis not present

## 2020-06-04 DIAGNOSIS — L89154 Pressure ulcer of sacral region, stage 4: Secondary | ICD-10-CM | POA: Diagnosis not present

## 2020-06-04 DIAGNOSIS — L89023 Pressure ulcer of left elbow, stage 3: Secondary | ICD-10-CM | POA: Diagnosis not present

## 2020-06-04 DIAGNOSIS — L89894 Pressure ulcer of other site, stage 4: Secondary | ICD-10-CM | POA: Diagnosis not present

## 2020-06-04 DIAGNOSIS — Z66 Do not resuscitate: Secondary | ICD-10-CM | POA: Diagnosis not present

## 2020-06-04 DIAGNOSIS — L89104 Pressure ulcer of unspecified part of back, stage 4: Secondary | ICD-10-CM | POA: Diagnosis not present

## 2020-06-04 DIAGNOSIS — I130 Hypertensive heart and chronic kidney disease with heart failure and stage 1 through stage 4 chronic kidney disease, or unspecified chronic kidney disease: Secondary | ICD-10-CM | POA: Diagnosis not present

## 2020-06-04 DIAGNOSIS — A4159 Other Gram-negative sepsis: Secondary | ICD-10-CM | POA: Diagnosis not present

## 2020-06-04 DIAGNOSIS — A419 Sepsis, unspecified organism: Secondary | ICD-10-CM | POA: Diagnosis not present

## 2020-06-04 DIAGNOSIS — E872 Acidosis: Secondary | ICD-10-CM | POA: Diagnosis not present

## 2020-06-04 DIAGNOSIS — G8250 Quadriplegia, unspecified: Secondary | ICD-10-CM | POA: Diagnosis not present

## 2020-06-04 DIAGNOSIS — R601 Generalized edema: Secondary | ICD-10-CM | POA: Diagnosis not present

## 2020-06-04 DIAGNOSIS — Z9911 Dependence on respirator [ventilator] status: Secondary | ICD-10-CM | POA: Diagnosis not present

## 2020-06-04 DIAGNOSIS — L89214 Pressure ulcer of right hip, stage 4: Secondary | ICD-10-CM | POA: Diagnosis not present

## 2020-06-04 LAB — GLUCOSE, CAPILLARY
Glucose-Capillary: 103 mg/dL — ABNORMAL HIGH (ref 70–99)
Glucose-Capillary: 115 mg/dL — ABNORMAL HIGH (ref 70–99)

## 2020-06-04 NOTE — TOC Progression Note (Signed)
Transition of Care [facility]) - Progression Note    Patient Details  Name: Obadiah Dennard MRN: [id number] Date of Birth: 1965/12/11  Transition of Care [facility]) CM/SW Contact  Erin Sons, [place] Phone Number: 06/04/2020, 12:07 PM  Clinical Narrative:     CSW received call from [facility] requesting additional details of pt. CSW provided details included vax status (vax 3x), tube feeds, and no restraints. CSW provided attending Md's phone number for admissions at [facility] to discuss pt.   1315: CSW is informed by attending physician that [facility] reviewed pt with her cannot accept pt at time because he does not have acute needs and is on tube feeds.   Expected Discharge Plan: Hospice Medical Facility Barriers to Discharge: Hospice Bed not available (Medicare doesn't cover hospice and SNF at the same time; No hospice facilities identified that take patients with trach)  Expected Discharge Plan and Services Expected Discharge Plan: Hospice Medical Facility       Living arrangements for the past 2 months: Skilled Nursing Facility                                       Social Determinants of Health (SDOH) Interventions    Readmission Risk Interventions No flowsheet data found.

## 2020-06-04 NOTE — Progress Notes (Signed)
NAME:  Phillip Klein, MRN:  [id number], DOB:  30-Oct-1965, LOS: 36 ADMISSION DATE:  04/15/2020, CONSULTATION DATE:  2/27 REFERRING MD:  Silverio Lay, CHIEF COMPLAINT:  Sepsis   Brief History:  55 yo male who presented from [facility]. Admitted 2/24 with septic shock, ESBL [name]. Hickman catheter removed with concern for source of infection. Oliguric AKI , with baseline creatinine 1.8.   Past Medical History:  Chronic hypoxic respiratory failure/ tstomy/vent since 2021 after c spine surgery Hx of MRSA GERD Type 2 diabetes Anemia of chronic illness Osteomyelitis of cervical spine  Quadriplegia Spinal epidural abscess C2-C7 and T9-L4 Malnutrition Neurogenic astrostatic hypotension  HFrEF Untreated HCV Previous IVDU Cirrhosis  Chronic pain  Significant Hospital Events:  2/24 Admit with shock in the setting of klebsiella bacteremia  3/02 albumin 3/03 lasix 3/05 trial of SIMV/PS 3/14 Full support PEEP 10, 40% HGB 6.9 (TRH Mgmt) > diuresed / no transfusion (hemodilution) 3/17 On vent, PEEP 8 / 40%  Consults:  CCM ID Renal 3/2  Procedures:  Trach PTA >>  Hickman catheter PTA >> removed 2/26  Significant Diagnostic Tests:    Micro Data:  Blood 2/24  >> [name] ESBL 1/4 bottle Urine  2/26 >> multiple species   Antimicrobials:  Cefepime 2/24 >> 2/25 Vanc 2/24  >> 2/25  Mero 2/25  >>  3/9  Interim History / Subjective:  Awake, not able to answer questions for me. On full vent support.  Objective   Blood pressure 131/72, pulse 62, temperature (!) 96.3 F (35.7 C), temperature source Axillary, resp. rate (!) 0, height 6\' 2"  (1.88 m), weight 106.3 kg, SpO2 96 %.    Vent Mode: PRVC FiO2 (%):  [30 %] 30 % Set Rate:  [18 bmp] 18 bmp Vt Set:  [570 mL] 570 mL PEEP:  [5 cmH20] 5 cmH20 Pressure Support:  [15 cmH20] 15 cmH20 Plateau Pressure:  [20 cmH20-26 cmH20] 20 cmH20   Intake/Output Summary (Last 24 hours) at 06/04/2020 0844 Last data filed at 06/04/2020  0600 Gross per 24 hour  Intake 1300 ml  Output 580 ml  Net 720 ml   Filed Weights   05/28/20 0500 06/01/20 0352 06/03/20 0415  Weight: 103.8 kg 105.7 kg 106.3 kg    Examination: Constitutional: ill appearing man on vent  Eyes: tracking, pupils equal Ears, nose, mouth, and throat: trach with copious thick tan secretions Cardiovascular: RRR, ext warm Respiratory: Rhonci worse on R, triggers vent Gastrointestinal: PEG in place, +BS  Patient Lines/Drains/Airways Status    Active Line/Drains/Airways    Name Placement date Placement time Site Days   PICC Double Lumen 05/11/20 PICC Right Basilic 42 cm 0 cm 05/11/20  07/11/20  -- 24   Hemodialysis Catheter Right Internal jugular Triple lumen Temporary (Non-Tunneled) 05/22/20  1358  Internal jugular  13   Gastrostomy/Enterostomy Jejunostomy 22 Fr. RUQ 05/26/20  1635  RUQ  9   Colostomy LLQ --  --  LLQ  --   Urethral Catheter autumn goldsmith Latex 16 Fr. 04/30/20  1500  Latex  35   Incision (Closed) 04/09/2020 Abdomen 04/16/2020  2030  -- 36   Tracheostomy Shiley XLT Distal 7 mm Cuffed 05/24/20  1430  7 mm  11   Pressure Injury 04/18/2020 Vertebral column Mid Stage 4 - Full thickness tissue loss with exposed bone, tendon or muscle. 04/12/2020  2030  -- 36   Pressure Injury 04/19/2020 Sacrum Mid Stage 4 - Full thickness tissue loss with exposed bone, tendon or muscle. 04/11/2020  2030  -- 36   Pressure Injury 04/21/2020 Ischial tuberosity Left Stage 4 - Full thickness tissue loss with exposed bone, tendon or muscle. 04/27/2020  2030  -- 36   Pressure Injury 05/02/2020 Ischial tuberosity Right Stage 4 - Full thickness tissue loss with exposed bone, tendon or muscle. 04/17/2020  2030  -- 36   Pressure Injury Hip Right;Lateral Stage 4 - Full thickness tissue loss with exposed bone, tendon or muscle. --  --  -- --   Pressure Injury Leg Left;Lateral;Lower Unstageable - Full thickness tissue loss in which the base of the injury is covered by slough (yellow, tan, gray,  green or brown) and/or eschar (tan, brown or black) in the wound bed. --  --  -- --   Pressure Injury 05/03/2020 Leg Left;Lower;Lateral Unstageable - Full thickness tissue loss in which the base of the injury is covered by slough (yellow, tan, gray, green or brown) and/or eschar (tan, brown or black) in the wound bed. 04/17/2020  2030  -- 36   Pressure Injury 04/18/2020 Leg Right;Lower;Lateral Deep Tissue Pressure Injury - Purple or maroon localized area of discolored intact skin or blood-filled blister due to damage of underlying soft tissue from pressure and/or shear. 04/08/2020  2030  -- 36   Pressure Injury Leg Right;Lateral;Lower Unstageable - Full thickness tissue loss in which the base of the injury is covered by slough (yellow, tan, gray, green or brown) and/or eschar (tan, brown or black) in the wound bed. --  --  -- --   Pressure Injury 04/06/2020 Leg Right;Lateral;Lower Stage 1 -  Intact skin with non-blanchable redness of a localized area usually over a bony prominence. 04/25/2020  2030  -- 36   Pressure Injury 04/12/2020 Leg Right;Lateral;Lower Unstageable - Full thickness tissue loss in which the base of the injury is covered by slough (yellow, tan, gray, green or brown) and/or eschar (tan, brown or black) in the wound bed. 04/28/2020  2030  -- 36   Pressure Injury Leg Right;Lateral;Lower Unstageable - Full thickness tissue loss in which the base of the injury is covered by slough (yellow, tan, gray, green or brown) and/or eschar (tan, brown or black) in the wound bed. --  --  -- --   Pressure Injury 04/25/2020 Elbow Posterior;Right Unstageable - Full thickness tissue loss in which the base of the injury is covered by slough (yellow, tan, gray, green or brown) and/or eschar (tan, brown or black) in the wound bed. slough 04/12/2020  2030  -- 36   Pressure Injury 04/19/2020 Elbow Left;Posterior Stage 3 -  Full thickness tissue loss. Subcutaneous fat may be visible but bone, tendon or muscle are NOT exposed. 04/30/2020   2030  -- 36   Pressure Injury 04/24/2020 Arm Left;Lower;Posterior;Proximal Unstageable - Full thickness tissue loss in which the base of the injury is covered by slough (yellow, tan, gray, green or brown) and/or eschar (tan, brown or black) in the wound bed. 04/24/2020  2030  -- 36   Wound / Incision (Open or Dehisced) 04/11/2020 Skin tear Arm Anterior;Lower;Proximal;Right 04/16/2020  2030  Arm  36          Skin: see above, extensive bruising of L shoulder Neurologic: not moving LE (quadriplegic) Psychiatric: RASS 0  No new labs   Resolved Hospital Problem list   Septic shock  Assessment & Plan:   Chronic Hypoxemic Respiratory Failure due to Quadriplegia, status post tracheostomy. Chronically Vent Dependent due to NM weakness. [name] bacteremia from UTI vs line infection-  Hickman catheter removed  Pacemaker in Place AKI likely ATN in setting of septic shock, no longer on hemodialysis (March 20- 25); worsening azotemia again since. Anasarca Jejunostomy tube drainage> improved Hypotension likely related to dysautonomia - off midodrine; now hypertensive Chronic pain Depression, insomnia Anemia of chronic disease + ABLA with Oozing from Wounds / Uremic Platelet Dysfunction Chronic pain Decubitus ulcers Hyperkalemia  - Appreciate palliative input - Plans to take off vent when/if azotemia induces coma - Continue vent support in interim and PRN suctioning - PCCM will be available PRN  Myrla Halsted MD PCCM

## 2020-06-04 NOTE — Progress Notes (Addendum)
PROGRESS NOTE    Phillip Klein  NKN:[id number] DOB: 08/21/1965 DOA: 05/02/2020 PCP: [name], MD    Brief Narrative: This 55 years old male with PMH significant for chronic hypoxic respiratory failure with tracheostomy and vent since 2021 after C-spine surgery, GERD, DM, anemia of chronic disease, quadriplegia, malnutrition, neurogenic orthostatic hypotension, untreated HCV, previous IV drug use, cirrhosis, chronic pain syndrome was sent from [facility] with septic shock. Patient admitted in the ICU with septic shock secondary to ESBL/ [name] bacteremia. Patient had Hickman's catheter which was removed, presumed to be the source of infection. Patient has completed meropenem for 10 days after removal of the catheter. Patient has developed acute kidney injury secondary to ischemic ATN from pressors requiring septic shock.,Cardiology,Nephrology is following. PCCM is assisting with vent management. Patient had multiple chronic bedsores,wound care is following. Palliative care was consulted to discuss goals of care.  Patient eventually started on temporary hemodialysis.  Patient is not a candidate for long-term outpatient dialysis because of poor functional status and multiple comorbidities. Family wanted full scope of treatment initially.  Patient is developing uremic symptoms, poor candidate for further dialysis.  He is vent dependent, previous trials at weaning him from ventilator were unsuccessful.  Palliative care is to readdress goals of care.    Plan: Full comfort care related to renal function, no more dialysis, no IV fluids or antibiotics, No symptoms management from renal failure but to continue ventilation and tube feeding until he becomes unresponsive  Discussed with [facility] hosp[facility] facility, unable to take patient because he has no acute needs and currently still on tube feeds.  If that changes, may contact them again for reevaluation.    Assessment & Plan:    Principal Problem:   MRSA Pneumonia Active Problems:   Septic shock ([place])   Pressure injury of skin   Malnutrition of moderate degree   Encounter for feeding tube placement   Hypotension   Quadriplegia (HCC)   Gram-negative bacteremia   Infection due to ESBL-producing [name] pneumoniae   Chronic hepatitis C without hepatic coma (HCC)   Anemia   Jejunostomy tube leak (HCC)   Pleural effusion   Sinus arrest   Pacemaker   HFrEF (heart failure with reduced ejection fraction) (HCC)   Severe malnutrition (HCC)   Anasarca   Acute renal failure superimposed on stage 3b chronic kidney disease (HCC)   Septic shock, POA secondary to [name] bacteremia, UTI versus line infection  -Hickman catheter presumed to be the source of infection, removed. -Blood cultures + [name] bacteremia, UA positive, culture showed multiple species -Completed meropenem for 10 days on 05/12/2020, repeat blood cultures negative -Completed 7 days of linezolid -PICC line inserted on 3/8, venous Doppler showed no DVT. -Required pressors, weaned off  Acute kidney injury sec. to ischemic ATN, chronic hypotension, anasarca -Patient underwent hemodialysis 3/20, 3/21, 3/22 and 3/24 -Full comfort care related to renal function, no more dialysis, no IV fluids or antibiotics, manage symptoms from renal failure but to continue ventilation and tube feeding until he becomes unresponsive  Acute blood loss anemia with intermittent bleeding from wounds -Platelet dysfunction in the setting of severe azotemia. -Nephrology recommended the conjugated estrogen (Premarin) via tube for 5 days, completed 3/20. -Required blood transfusion -Comfort care  Essential hypertension -Comfort care  Acute on chronic combined systolic and diastolic CHF exacerbation with anasarca -Other contributers include severe protein calorie malnutrition with hypoalbuminemia and third spacing -2D echo showed EF of 40 to 45% with global  hypokinesis -Nephrology managed  with short-term hemodialysis, currently discontinued, comfort care  Chronic ventilator dependent respiratory failure, quadriplegia PEG placement -Management per PCCM, continue ventilatory support with trach. -Continue feeding through tube feed. -Patient is ventilator dependent due to respiratory muscle weakness.  Orthostatic hypotension Florinef was discontinued in ICU due to concern for fluid retention, midodrine has been stopped  Chronic pain syndrome Continue pain management  Depression, insomnia Continue sertraline, trazodone, clonazepam  GERD Continue PPI  Multiple pressure wounds, present on admission -Full-thickness vertebral column stage IV -Sacrum stage IV, -Right and left ischial tuberosity, stage IV -Right lateral hip, stage IV -Right and left lower leg lateral, unstageable -Right (unstageable now) and left posterior elbow, stage III - left posterior lower arm, unstageable   Patient has been refusing wound care/dressing.  Obesity Estimated body mass index is 30.43 kg/m as calculated from the following:   Height as of this encounter: 6' 2" (1.88 m).   Weight as of this encounter: 107.5 kg.   DVT prophylaxis:  SCDs Code Status: DNR Family Communication: None at bedside Disposition Plan:  Status is: Inpatient  Remains inpatient appropriate because:Inpatient level of care appropriate due to severity of illness   Dispo: The patient is from: [facility]              Anticipated d/c is to: Inpatient or residential hospice which accepts trach/vent              Patient currently is not medically stable to d/c.   Difficult to place patient Yes   Full comfort care related to renal function, no more dialysis, no IV fluids or antibiotics, manage symptoms from renal failure but to continue ventilation and tube feeding until he becomes unresponsive     Consultants:   PCCM  Cardiology  Nephrology  IR  Palliative  care  Procedures: Tracheostomy change, GJ tube change, RIJ central line.   Antimicrobials: Anti-infectives (From admission, onward)   Start     Dose/Rate Route Frequency Ordered Stop   05/27/20 2000  linezolid (ZYVOX) 100 MG/5ML suspension 600 mg  Status:  Discontinued        600 mg Per Tube Every 12 hours 05/27/20 0838 05/27/20 1130   05/27/20 2000  linezolid (ZYVOX) tablet 600 mg        600 mg Per Tube Every 12 hours 05/27/20 1130 06/01/20 0925   05/25/20 1845  linezolid (ZYVOX) IVPB 600 mg  Status:  Discontinued        600 mg 300 mL/hr over 60 Minutes Intravenous Every 12 hours 05/25/20 1748 05/27/20 0838   05/19/20 1000  fluconazole (DIFLUCAN) tablet 50 mg       "Followed by" Linked Group Details   50 mg Oral Daily 05/18/20 1048 05/27/20 0929   05/18/20 1145  fluconazole (DIFLUCAN) tablet 100 mg       "Followed by" Linked Group Details   100 mg Oral  Once 05/18/20 1048 05/18/20 1145   05/11/20 0800  meropenem (MERREM) 1 g in sodium chloride 0.9 % 100 mL IVPB        1 g 200 mL/hr over 30 Minutes Intravenous Every 8 hours 05/11/20 0707 05/12/20 1555   05/11/20 0715  meropenem (MERREM) 1 g in sodium chloride 0.9 % 100 mL IVPB  Status:  Discontinued        1 g 200 mL/hr over 30 Minutes Intravenous Every 8 hours 05/11/20 0706 05/11/20 0707   05/03/20 2200  meropenem (MERREM) 1 g in sodium chloride 0.9 % 100 mL IVPB  Status:  Discontinued        1 g 200 mL/hr over 30 Minutes Intravenous Every 12 hours 05/03/20 1041 05/11/20 0706   04/30/20 1145  meropenem (MERREM) 2 g in sodium chloride 0.9 % 100 mL IVPB  Status:  Discontinued        2 g 200 mL/hr over 30 Minutes Intravenous Every 12 hours 04/30/20 1052 05/03/20 1041   04/30/20 0530  ceFEPIme (MAXIPIME) 2 g in sodium chloride 0.9 % 100 mL IVPB  Status:  Discontinued        2 g 200 mL/hr over 30 Minutes Intravenous Every 12 hours 04/15/2020 1852 04/30/20 1051   04/22/2020 1852  vancomycin variable dose per unstable renal function  (pharmacist dosing)  Status:  Discontinued         Does not apply See admin instructions 04/28/2020 1852 04/30/20 1051   04/24/2020 1630  vancomycin (VANCOREADY) IVPB 2000 mg/400 mL        2,000 mg 200 mL/hr over 120 Minutes Intravenous  Once 04/13/2020 1621 04/28/2020 2008   05/01/2020 1630  ceFEPIme (MAXIPIME) 2 g in sodium chloride 0.9 % 100 mL IVPB        2 g 200 mL/hr over 30 Minutes Intravenous  Once 04/20/2020 1621 05/02/2020 1749     Subjective: Patient seen and examined at bedside, able to mouth needs/words, mostly difficult to understand.  Noted generalized anasarca.  Currently comfort care but with trach/vent and tube feeds    Objective: Vitals:   06/04/20 0500 06/04/20 0529 06/04/20 0600 06/04/20 1130  BP: 131/70  131/72   Pulse: 64  62   Resp: 15  (!) 0   Temp:      TempSrc:      SpO2: 97% 96% 96% 95%  Weight:      Height:        Intake/Output Summary (Last 24 hours) at 06/04/2020 1434 Last data filed at 06/04/2020 0600 Gross per 24 hour  Intake 910 ml  Output 580 ml  Net 330 ml   Filed Weights   05/28/20 0500 06/01/20 0352 06/03/20 0415  Weight: 103.8 kg 105.7 kg 106.3 kg    Examination:  General: NAD, able to mouth words but difficult to understand, noted generalized anasarca  Cardiovascular: S1, S2 present  Respiratory:  Rhonchi noted bilaterally  Abdomen: Soft, nontender, nondistended, bowel sounds present, left-sided colostomy bag noted  Musculoskeletal: Noted 3+ bilateral lower extremity edema with bilateral heel protectors  Skin:  Multiple pressure ulcers in multiple locations  Psychiatry:  Unable to assess    Data Reviewed: I have personally reviewed following labs and imaging studies  CBC: Recent Labs  Lab 05/29/20 0300 05/30/20 0242 05/31/20 0530 06/01/20 0345 06/02/20 0430  HGB 7.8* 8.0* 7.5* 7.2* 7.5*  HCT 24.7* 25.3* 24.9* 23.0* 76.2*   Basic Metabolic Panel: Recent Labs  Lab 05/30/20 0241 05/30/20 0242 05/31/20 0530 06/01/20 0345  06/01/20 1000 06/02/20 0430 06/03/20 0350  NA 139 138 140 141  --  141 141  K 4.1 4.0 4.6 5.2* 5.3* 5.4* 5.8*  CL 105 104 105 106  --  107 110  CO2 _0 --  23 21*  GLUCOSE 155* 154* 141* 125*  --  125* 108*  BUN 160* 154* 166* 182*  --  188* 192*  CREATININE 2.27* 2.26* 2.33* 2.48*  --  2.55* 2.53*  CALCIUM 9.2 9.3 9.4 9.3  --  9.2 9.0  MG  --   --  2.0  --   --  2.0  --   PHOS 7.3*  --  6.8* 7.0*  --  7.4* 7.9*   GFR: Estimated Creatinine Clearance: 44.3 mL/min (A) (by C-G formula based on SCr of 2.53 mg/dL (H)). Liver Function Tests: Recent Labs  Lab 05/30/20 0241 05/31/20 0530 06/01/20 0345 06/02/20 0430 06/03/20 0350  ALBUMIN 2.4* 2.3* 2.2* 2.1* 2.0*   No results for input(s): LIPASE, AMYLASE in the last 168 hours. No results for input(s): AMMONIA in the last 168 hours. Coagulation Profile: No results for input(s): INR, PROTIME in the last 168 hours. Cardiac Enzymes: No results for input(s): CKTOTAL, CKMB, CKMBINDEX, TROPONINI in the last 168 hours. BNP (last 3 results) No results for input(s): PROBNP in the last 8760 hours. HbA1C: No results for input(s): HGBA1C in the last 72 hours. CBG: Recent Labs  Lab 06/02/20 1104 06/02/20 1805 06/02/20 2347 06/03/20 1215 06/04/20 0814  GLUCAP 104* 103* 100* 96 115*   Lipid Profile: No results for input(s): CHOL, HDL, LDLCALC, TRIG, CHOLHDL, LDLDIRECT in the last 72 hours. Thyroid Function Tests: No results for input(s): TSH, T4TOTAL, FREET4, T3FREE, THYROIDAB in the last 72 hours. Anemia Panel: No results for input(s): VITAMINB12, FOLATE, FERRITIN, TIBC, IRON, RETICCTPCT in the last 72 hours. Sepsis Labs: No results for input(s): PROCALCITON, LATICACIDVEN in the last 168 hours.  No results found for this or any previous visit (from the past 240 hour(s)).  Radiology Studies: No results found. Scheduled Meds: . chlorhexidine gluconate (MEDLINE KIT)  15 mL Mouth Rinse BID  . clonazePAM  0.5 mg Per Tube TID   . fentaNYL  1 patch Transdermal Q72H   And  . fentaNYL  1 patch Transdermal Q72H  . mouth rinse  15 mL Mouth Rinse 10 times per day  . ondansetron (ZOFRAN) IV  4 mg Intravenous Q8H  . pantoprazole sodium  40 mg Per Tube Daily  . sertraline  100 mg Per Tube Daily  . sodium chloride flush  10-40 mL Intracatheter Q12H  . traZODone  100 mg Per Tube QHS   Continuous Infusions: . sodium chloride 10 mL/hr at 06/02/20 1000  . feeding supplement (OSMOLITE 1.5 CAL) 1,000 mL (06/03/20 2200)     LOS: 36 days     Alma Friendly, MD [facility]   If 7PM-7AM, please contact night-coverage

## 2020-06-04 NOTE — Progress Notes (Addendum)
[facility] NEPHROLOGY PROGRESS NOTE  Assessment/ Plan:  # Acute kidney Injury: Secondary to ischemic ATN from pressor requiring septic shock (with background history of chronic hypotension). Didn't improve after a limited trial of CRRT and in light of comorbids not a candidate for longterm dialysis.  HCPOA involved in discussions and moving to comfort care as detailed in palliative care notes.  Appears he's becoming increasingly uremic.   Nephrology will follow peripherally.  Nothing further to add.   Subjective: Seen and examined in ICU. Sleepy.  Moving to comfort measures.   Objective Vital signs in last 24 hours: Vitals:   06/04/20 0400 06/04/20 0500 06/04/20 0529 06/04/20 0600  BP: 132/72 131/70  131/72  Pulse: 60 64  62  Resp: (!) 0 15  (!) 0  Temp: (!) 96.3 F (35.7 C)     TempSrc: Axillary     SpO2: 97% 97% 96% 96%  Weight:      Height:       Weight change:   Intake/Output Summary (Last 24 hours) at 06/04/2020 1059 Last data filed at 06/04/2020 0600 Gross per 24 hour  Intake 1170 ml  Output 580 ml  Net 590 ml       Labs: Basic Metabolic Panel: Recent Labs  Lab 06/01/20 0345 06/01/20 1000 06/02/20 0430 06/03/20 0350  NA 141  --  141 141  K 5.2* 5.3* 5.4* 5.8*  CL 106  --  107 110  CO2 23  --  23 21*  GLUCOSE 125*  --  125* 108*  BUN 182*  --  188* 192*  CREATININE 2.48*  --  2.55* 2.53*  CALCIUM 9.3  --  9.2 9.0  PHOS 7.0*  --  7.4* 7.9*   Liver Function Tests: Recent Labs  Lab 06/01/20 0345 06/02/20 0430 06/03/20 0350  ALBUMIN 2.2* 2.1* 2.0*   No results for input(s): LIPASE, AMYLASE in the last 168 hours. No results for input(s): AMMONIA in the last 168 hours. CBC: Recent Labs  Lab 05/31/20 0530 06/01/20 0345 06/02/20 0430  HGB 7.5* 7.2* 7.5*  HCT 24.9* 23.0* 24.5*   Cardiac Enzymes: No results for input(s): CKTOTAL, CKMB, CKMBINDEX, TROPONINI in the last 168 hours. CBG: Recent Labs  Lab 06/02/20 1104 06/02/20 1805  06/02/20 2347 06/03/20 1215 06/04/20 0814  GLUCAP 104* 103* 100* 96 115*    Iron Studies: No results for input(s): IRON, TIBC, TRANSFERRIN, FERRITIN in the last 72 hours. Studies/Results: No results found.  Medications: Infusions: . sodium chloride 10 mL/hr at 06/02/20 1000  . feeding supplement (OSMOLITE 1.5 CAL) 1,000 mL (06/03/20 2200)    Scheduled Medications: . chlorhexidine gluconate (MEDLINE KIT)  15 mL Mouth Rinse BID  . clonazePAM  0.5 mg Per Tube TID  . fentaNYL  1 patch Transdermal Q72H   And  . fentaNYL  1 patch Transdermal Q72H  . mouth rinse  15 mL Mouth Rinse 10 times per day  . ondansetron (ZOFRAN) IV  4 mg Intravenous Q8H  . pantoprazole sodium  40 mg Per Tube Daily  . sertraline  100 mg Per Tube Daily  . sodium chloride flush  10-40 mL Intracatheter Q12H  . traZODone  100 mg Per Tube QHS    have reviewed scheduled and prn medications.  Physical Exam: General: Elderly male, ill looking sitting up in bed.  On vent via tracheostomy  Heart:RRR, s1s2 nl Lungs: unlabored, +trach Extremities: anasarca present Neurology: sleeping  Justin Mend 06/04/2020,10:59 AM  LOS: 36 days

## 2020-06-04 NOTE — Progress Notes (Addendum)
Daily Progress Note   Patient Name: Phillip Klein       Date: 06/04/2020 DOB: June 27, 1965  Age: 55 y.o. MRN#: [id number] Attending Physician: Alma Friendly, MD Primary Care Physician: [name], MD Admit Date: 04/24/2020  Reason for Consultation/Follow-up: Establishing goals of care  Subjective:  Patient in bed, dressing changes being done. Not awake. Nursing staff reports he has been comfortable, was awake this morning.  Spoke with Phillip Klein- answered her questions about placement. No word yet from the [facility] with [facility]  Phillip Klein asked about blood transfusions- we discussed that we won't be checking labs or doing blood transfusions as they not imperative for comfort. She understood.   Review of Systems  Unable to perform ROS: Acuity of condition    Length of Stay: 36  Current Medications: Scheduled Meds:  . carvedilol  12.5 mg Per Tube BID WC  . chlorhexidine gluconate (MEDLINE KIT)  15 mL Mouth Rinse BID  . clonazePAM  0.5 mg Per Tube TID  . fentaNYL  1 patch Transdermal Q72H   And  . fentaNYL  1 patch Transdermal Q72H  . mouth rinse  15 mL Mouth Rinse 10 times per day  . ondansetron (ZOFRAN) IV  4 mg Intravenous Q8H  . pantoprazole sodium  40 mg Per Tube Daily  . sertraline  100 mg Per Tube Daily  . sodium chloride flush  10-40 mL Intracatheter Q12H  . traZODone  100 mg Per Tube QHS    Continuous Infusions: . sodium chloride 10 mL/hr at 06/02/20 1000  . feeding supplement (OSMOLITE 1.5 CAL) 1,000 mL (06/03/20 2200)    PRN Meds: sodium chloride, acetaminophen, albuterol, docusate, fentaNYL (SUBLIMAZE) injection, glycopyrrolate **OR** glycopyrrolate **OR** glycopyrrolate, LORazepam, polyethylene glycol, sodium chloride flush  Physical Exam Vitals  and nursing note reviewed.  Cardiovascular:     Comments: Diffuse anasarca Pulmonary:     Comments: Trach/vent dependent Skin:    General: Skin is warm and dry.     Coloration: Skin is pale.     Comments: Large bruise on L shoulder  Psychiatric:     Comments: lethargic             Vital Signs: BP 131/72   Pulse 62   Temp (!) 96.3 F (35.7 C) (Axillary)   Resp (!) 0   Ht  '6\' 2"'  (1.88 m)   Wt 106.3 kg   SpO2 96%   BMI 30.09 kg/m  SpO2: SpO2: 96 % O2 Device: O2 Device: Ventilator O2 Flow Rate: O2 Flow Rate (L/min): 60 L/min  Intake/output summary:   Intake/Output Summary (Last 24 hours) at 06/04/2020 1019 Last data filed at 06/04/2020 0600 Gross per 24 hour  Intake 1170 ml  Output 580 ml  Net 590 ml   LBM: Last BM Date: 06/02/20 Baseline Weight: Weight: 107.5 kg Most recent weight: Weight: 106.3 kg       Palliative Assessment/Data: PPS: 10%    Flowsheet Rows   Flowsheet Row Most Recent Value  Intake Tab   Referral Department Hospitalist  Unit at Time of Referral ICU  Palliative Care Primary Diagnosis Sepsis/Infectious Disease  Date Notified 05/12/20  Palliative Care Type New Palliative care  Reason for referral Clarify Goals of Care  Date of Admission 04/09/2020  Date first seen by Palliative Care 05/13/20  # of days Palliative referral response time 1 Day(s)  # of days IP prior to Palliative referral 13  Clinical Assessment   Psychosocial & Spiritual Assessment   Palliative Care Outcomes       Patient Active Problem List   Diagnosis Date Noted  . MRSA Pneumonia 05/26/2020  . Severe malnutrition ([place])   . Anasarca   . Acute renal failure superimposed on stage 3b chronic kidney disease ([place])   . Sinus arrest   . Pacemaker   . HFrEF (heart failure with reduced ejection fraction) ([place])   . Anemia   . Jejunostomy tube leak ([place])   . Pleural effusion   . Quadriplegia ([place]) 05/01/2020  . Gram-negative bacteremia 05/01/2020  . Infection due to  ESBL-producing [name] pneumoniae 05/01/2020  . Chronic hepatitis C without hepatic coma ([place]) 05/01/2020  . Encounter for feeding tube placement   . Hypotension   . Pressure injury of skin 04/30/2020  . Malnutrition of moderate degree 04/30/2020  . 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 Palliative medicine consulted for goals of care as patient was refusing dressing changes and wound care and other care interventions.He is now in renal failure with progressive uremia. CRRT was attempted for renal resuscitation, however, was not successful. He is not a candidate for long term hemodialysis. He has anemia and blood counts are continuing to drop due to renal failure and oozing wounds. He has diffuse anasarca from renal failure and poor albumin status. He is not weaning from ventilator. Now transitioning to comfort measures only.   Assessment/Recommendations/Plan   Continue current comfort measures  Appreciate TOC working on placement- if [facility] doesn't accept- he likely will remain in hospital for duration  Goals of Care and Additional Recommendations:  Limitations on Scope of Treatment: Full Comfort Care  Code Status:  DNR  Prognosis:   < 4 weeks- possibly less- will continue to evaluate  Discharge Planning:  To Be Determined- Hospice facility vs hospital death  Care plan was discussed with nurse and [place]  Thank you for allowing the Palliative  Medicine Team to assist in the care of this patient.  Total time: 26 mins  Greater than 50%  of this time was spent counseling and coordinating care related to the above assessment and plan.  Phillip Klein, AGNP-C Palliative Medicine   Please contact Palliative Medicine Team phone at [phone] for questions and concerns.

## 2020-06-05 DIAGNOSIS — R7881 Bacteremia: Secondary | ICD-10-CM | POA: Diagnosis not present

## 2020-06-05 DIAGNOSIS — I959 Hypotension, unspecified: Secondary | ICD-10-CM | POA: Diagnosis not present

## 2020-06-05 DIAGNOSIS — A4159 Other Gram-negative sepsis: Secondary | ICD-10-CM | POA: Diagnosis not present

## 2020-06-05 DIAGNOSIS — Z515 Encounter for palliative care: Secondary | ICD-10-CM | POA: Diagnosis not present

## 2020-06-05 DIAGNOSIS — R601 Generalized edema: Secondary | ICD-10-CM | POA: Diagnosis not present

## 2020-06-05 DIAGNOSIS — I5020 Unspecified systolic (congestive) heart failure: Secondary | ICD-10-CM | POA: Diagnosis not present

## 2020-06-05 DIAGNOSIS — N170 Acute kidney failure with tubular necrosis: Secondary | ICD-10-CM | POA: Diagnosis not present

## 2020-06-05 DIAGNOSIS — A4902 Methicillin resistant Staphylococcus aureus infection, unspecified site: Secondary | ICD-10-CM | POA: Diagnosis not present

## 2020-06-05 NOTE — Progress Notes (Signed)
Pt refused wound dressing changes.  Erick Blinks, RN

## 2020-06-05 NOTE — Progress Notes (Signed)
Daily Progress Note   Patient Name: Phillip Klein       Date: 06/05/2020 DOB: 1965-08-31  Age: 55 y.o. MRN#: [id number] Attending Physician: Alma Friendly, MD Primary Care Physician: [name], MD Admit Date: 04/17/2020  Reason for Consultation/Follow-up: Establishing goals of care  Subjective: Patient lethargic. Was grimacing when I saw him earlier this morning. HCPOA [name] at bedside. She has noticed significant change in his mental status over the last 24 hours.  We discussed his pacemaker. It does not have an ICD- therefore does not need to be turned off.  Role of artificial feeding and hydration at end of life discussed- Gone From My Sight book given. We discussed utilizing comfort bites and sips vs continued artificial feeding.  Wound care- very painful for patient.   ROS  Length of Stay: 37  Current Medications: Scheduled Meds:  . chlorhexidine gluconate (MEDLINE KIT)  15 mL Mouth Rinse BID  . clonazePAM  0.5 mg Per Tube TID  . fentaNYL  1 patch Transdermal Q72H   And  . fentaNYL  1 patch Transdermal Q72H  . mouth rinse  15 mL Mouth Rinse 10 times per day  . ondansetron (ZOFRAN) IV  4 mg Intravenous Q8H  . pantoprazole sodium  40 mg Per Tube Daily  . sertraline  100 mg Per Tube Daily  . sodium chloride flush  10-40 mL Intracatheter Q12H  . traZODone  100 mg Per Tube QHS    Continuous Infusions: . sodium chloride 10 mL/hr at 06/02/20 1000  . feeding supplement (OSMOLITE 1.5 CAL) 1,000 mL (06/05/20 1133)    PRN Meds: sodium chloride, acetaminophen, albuterol, docusate, fentaNYL (SUBLIMAZE) injection, glycopyrrolate **OR** glycopyrrolate **OR** glycopyrrolate, LORazepam, polyethylene glycol, sodium chloride flush  Physical Exam Vitals and nursing note  reviewed.  Constitutional:      Comments: lethargic  Cardiovascular:     Comments: Diffuse anasarca Skin:    Coloration: Skin is pale.             Vital Signs: BP 125/69   Pulse (!) 51   Temp (!) 96.3 F (35.7 C) (Axillary)   Resp 18   Ht _0  (1.88 m)   Wt 106.3 kg   SpO2 96%   BMI 30.09 kg/m  SpO2: SpO2: 96 % O2 Device: O2 Device: Ventilator O2 Flow Rate:  O2 Flow Rate (L/min): 60 L/min  Intake/output summary:   Intake/Output Summary (Last 24 hours) at 06/05/2020 1251 Last data filed at 06/05/2020 1200 Gross per 24 hour  Intake 1495 ml  Output 820 ml  Net 675 ml   LBM: Last BM Date: 06/02/20 Baseline Weight: Weight: 107.5 kg Most recent weight: Weight: 106.3 kg       Palliative Assessment/Data: PPS: 10%    Flowsheet Rows   Flowsheet Row Most Recent Value  Intake Tab   Referral Department Hospitalist  Unit at Time of Referral ICU  Palliative Care Primary Diagnosis Sepsis/Infectious Disease  Date Notified 05/12/20  Palliative Care Type New Palliative care  Reason for referral Clarify Goals of Care  Date of Admission 04/30/2020  Date first seen by Palliative Care 05/13/20  # of days Palliative referral response time 1 Day(s)  # of days IP prior to Palliative referral 13  Clinical Assessment   Psychosocial & Spiritual Assessment   Palliative Care Outcomes       Patient Active Problem List   Diagnosis Date Noted  . MRSA Pneumonia 05/26/2020  . Severe malnutrition ([place])   . Anasarca   . Acute renal failure superimposed on stage 3b chronic kidney disease ([place])   . Sinus arrest   . Pacemaker   . HFrEF (heart failure with reduced ejection fraction) ([place])   . Anemia   . Jejunostomy tube leak ([place])   . Pleural effusion   . Quadriplegia ([place]) 05/01/2020  . Gram-negative bacteremia 05/01/2020  . Infection due to ESBL-producing [name] pneumoniae 05/01/2020  . Chronic hepatitis C without hepatic coma ([place]) 05/01/2020  . Encounter for feeding tube  placement   . Hypotension   . Pressure injury of skin 04/30/2020  . Malnutrition of moderate degree 04/30/2020  . 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 Palliative medicine consulted for goals of care as patient was refusing dressing changes and wound care and other care interventions.He is now in renal failure with progressive uremia. CRRT was attempted for renal resuscitation, however, was not successful. He is not a candidate for long term hemodialysis. He has anemia and blood counts are continuing to drop due to renal failure and oozing wounds. He has diffuse anasarca from renal failure and poor albumin status. He is not weaning from ventilator. Now transitioning to comfort measures only.   Assessment/Recommendations/Plan   Continue current comfort care as ordered- please utilize IV prn fentanyl as needed  Will continue to monitor daily- when patient has become unresponsive will d/c vent  Will continue to discuss role of tube feedings  Change dressings daily for comfort and hygiene- no need to pack wounds- recommend premedicating before changing  Has been declined for hospice house in [place]- will likely have an extended stay in the hospital   Goals of Care and Additional Recommendations:  Limitations on Scope of Treatment: Full Comfort Care  Code Status:  DNR  Prognosis:   < 2 weeks  Discharge Planning:  Anticipated Hospital Death  Care plan  was discussed with [name]  Thank you for allowing the Palliative Medicine Team to assist  in the care of this patient.   Total time: 36 mins Greater than 50%  of this time was spent counseling and coordinating care related to the above assessment and plan.  Mariana Kaufman, AGNP-C Palliative Medicine   Please contact Palliative Medicine Team phone at [phone] for questions and concerns.

## 2020-06-05 NOTE — Progress Notes (Signed)
PROGRESS NOTE    Phillip Klein  NKN:[id number] DOB: 08/21/1965 DOA: 04/13/2020 PCP: [name], MD    Brief Narrative: This 55 years old male with PMH significant for chronic hypoxic respiratory failure with tracheostomy and vent since 2021 after C-spine surgery, GERD, DM, anemia of chronic disease, quadriplegia, malnutrition, neurogenic orthostatic hypotension, untreated HCV, previous IV drug use, cirrhosis, chronic pain syndrome was sent from [facility] with septic shock. Patient admitted in the ICU with septic shock secondary to ESBL/ [name] bacteremia. Patient had Hickman's catheter which was removed, presumed to be the source of infection. Patient has completed meropenem for 10 days after removal of the catheter. Patient has developed acute kidney injury secondary to ischemic ATN from pressors requiring septic shock.,Cardiology,Nephrology is following. PCCM is assisting with vent management. Patient had multiple chronic bedsores,wound care is following. Palliative care was consulted to discuss goals of care.  Patient eventually started on temporary hemodialysis.  Patient is not a candidate for long-term outpatient dialysis because of poor functional status and multiple comorbidities. Family wanted full scope of treatment initially.  Patient is developing uremic symptoms, poor candidate for further dialysis.  He is vent dependent, previous trials at weaning him from ventilator were unsuccessful.  Palliative care is to readdress goals of care.    Plan: Full comfort care related to renal function, no more dialysis, no IV fluids or antibiotics, No symptoms management from renal failure but to continue ventilation and tube feeding until he becomes unresponsive  Discussed with [facility] hospice facility, unable to take patient because he has no acute needs and currently still on tube feeds.  If that changes, may contact them again for reevaluation.    Assessment & Plan:    Principal Problem:   MRSA Pneumonia Active Problems:   Septic shock ([place])   Pressure injury of skin   Malnutrition of moderate degree   Encounter for feeding tube placement   Hypotension   Quadriplegia ([facility])   Gram-negative bacteremia   Infection due to ESBL-producing [name] pneumoniae   Chronic hepatitis C without hepatic coma ([facility])   Anemia   Jejunostomy tube leak ([facility])   Pleural effusion   Sinus arrest   Pacemaker   HFrEF (heart failure with reduced ejection fraction) (H[facility])   Severe malnutrition (H[facility])   Anasarca   Acute renal failure superimposed on stage 3b chronic kidney disease (H[facility])   Septic shock, POA secondary to [name] bacteremia, UTI versus line infection  -Hickman catheter presumed to be the source of infection, removed. -Blood cultures + [name] bacteremia, UA positive, culture showed multiple species -Completed meropenem for 10 days on 05/12/2020, repeat blood cultures negative -Completed 7 days of linezolid -PICC line inserted on 3/8, venous Doppler showed no DVT. -Required pressors, weaned off  Acute kidney injury sec. to ischemic ATN, chronic hypotension, anasarca -Patient underwent hemodialysis 3/20, 3/21, 3/22 and 3/24 -Full comfort care related to renal function, no more dialysis, no IV fluids or antibiotics, manage symptoms from renal failure but to continue ventilation and tube feeding until he becomes unresponsive  Acute blood loss anemia with intermittent bleeding from wounds -Platelet dysfunction in the setting of severe azotemia. -Nephrology recommended the conjugated estrogen (Premarin) via tube for 5 days, completed 3/20. -Required blood transfusion -Comfort care  Essential hypertension -Comfort care  Acute on chronic combined systolic and diastolic CHF exacerbation with anasarca -Other contributers include severe protein calorie malnutrition with hypoalbuminemia and third spacing -2D echo showed EF of 40 to 45% with global  hypokinesis -Nephrology managed  with short-term hemodialysis, currently discontinued, comfort care  Chronic ventilator dependent respiratory failure, quadriplegia PEG placement -Management per PCCM, continue ventilatory support with trach. -Continue feeding through tube feed. -Patient is ventilator dependent due to respiratory muscle weakness.  Orthostatic hypotension Florinef was discontinued in ICU due to concern for fluid retention, midodrine has been stopped  Chronic pain syndrome Continue pain management  Depression, insomnia Continue sertraline, trazodone, clonazepam  GERD Continue PPI  Multiple pressure wounds, present on admission -Full-thickness vertebral column stage IV -Sacrum stage IV, -Right and left ischial tuberosity, stage IV -Right lateral hip, stage IV -Right and left lower leg lateral, unstageable -Right (unstageable now) and left posterior elbow, stage III - left posterior lower arm, unstageable   Patient has been refusing wound care/dressing.  Obesity Estimated body mass index is 30.43 kg/m as calculated from the following:   Height as of this encounter: 6' 2" (1.88 m).   Weight as of this encounter: 107.5 kg.   DVT prophylaxis:  SCDs Code Status: DNR Family Communication: None at bedside Disposition Plan:  Status is: Inpatient  Remains inpatient appropriate because:Inpatient level of care appropriate due to severity of illness   Dispo: The patient is from: [facility]              Anticipated d/c is to: Inpatient or residential hospice which accepts trach/vent              Patient currently is not medically stable to d/c.   Difficult to place patient Yes   Full comfort care related to renal function, no more dialysis, no IV fluids or antibiotics, manage symptoms from renal failure but to continue ventilation and tube feeding until he becomes unresponsive     Consultants:   PCCM  Cardiology  Nephrology  IR  Palliative  care  Procedures: Tracheostomy change, GJ tube change, RIJ central line.   Antimicrobials: Anti-infectives (From admission, onward)   Start     Dose/Rate Route Frequency Ordered Stop   05/27/20 2000  linezolid (ZYVOX) 100 MG/5ML suspension 600 mg  Status:  Discontinued        600 mg Per Tube Every 12 hours 05/27/20 0838 05/27/20 1130   05/27/20 2000  linezolid (ZYVOX) tablet 600 mg        600 mg Per Tube Every 12 hours 05/27/20 1130 06/01/20 0925   05/25/20 1845  linezolid (ZYVOX) IVPB 600 mg  Status:  Discontinued        600 mg 300 mL/hr over 60 Minutes Intravenous Every 12 hours 05/25/20 1748 05/27/20 0838   05/19/20 1000  fluconazole (DIFLUCAN) tablet 50 mg       "Followed by" Linked Group Details   50 mg Oral Daily 05/18/20 1048 05/27/20 0929   05/18/20 1145  fluconazole (DIFLUCAN) tablet 100 mg       "Followed by" Linked Group Details   100 mg Oral  Once 05/18/20 1048 05/18/20 1145   05/11/20 0800  meropenem (MERREM) 1 g in sodium chloride 0.9 % 100 mL IVPB        1 g 200 mL/hr over 30 Minutes Intravenous Every 8 hours 05/11/20 0707 05/12/20 1555   05/11/20 0715  meropenem (MERREM) 1 g in sodium chloride 0.9 % 100 mL IVPB  Status:  Discontinued        1 g 200 mL/hr over 30 Minutes Intravenous Every 8 hours 05/11/20 0706 05/11/20 0707   05/03/20 2200  meropenem (MERREM) 1 g in sodium chloride 0.9 % 100 mL IVPB  Status:  Discontinued        1 g 200 mL/hr over 30 Minutes Intravenous Every 12 hours 05/03/20 1041 05/11/20 0706   04/30/20 1145  meropenem (MERREM) 2 g in sodium chloride 0.9 % 100 mL IVPB  Status:  Discontinued        2 g 200 mL/hr over 30 Minutes Intravenous Every 12 hours 04/30/20 1052 05/03/20 1041   04/30/20 0530  ceFEPIme (MAXIPIME) 2 g in sodium chloride 0.9 % 100 mL IVPB  Status:  Discontinued        2 g 200 mL/hr over 30 Minutes Intravenous Every 12 hours 04/11/2020 1852 04/30/20 1051   04/11/2020 1852  vancomycin variable dose per unstable renal function  (pharmacist dosing)  Status:  Discontinued         Does not apply See admin instructions 04/28/2020 1852 04/30/20 1051   04/10/2020 1630  vancomycin (VANCOREADY) IVPB 2000 mg/400 mL        2,000 mg 200 mL/hr over 120 Minutes Intravenous  Once 05/03/2020 1621 04/21/2020 2008   04/23/2020 1630  ceFEPIme (MAXIPIME) 2 g in sodium chloride 0.9 % 100 mL IVPB        2 g 200 mL/hr over 30 Minutes Intravenous  Once 04/22/2020 1621 04/16/2020 1749     Subjective: Pt seen and examined at bedside.Marland Kitchen Appears more lethargic.    Objective: Vitals:   06/05/20 1300 06/05/20 1400 06/05/20 1500 06/05/20 1703  BP: 126/70 123/70 120/71 108/67  Pulse: (!) 54 (!) 59 65 69  Resp: _0 Temp:      TempSrc:      SpO2: 98% 97% 97% 93%  Weight:      Height:        Intake/Output Summary (Last 24 hours) at 06/05/2020 1729 Last data filed at 06/05/2020 1300 Gross per 24 hour  Intake 1300 ml  Output 820 ml  Net 480 ml   Filed Weights   05/28/20 0500 06/01/20 0352 06/03/20 0415  Weight: 103.8 kg 105.7 kg 106.3 kg    Examination:  General: NAD, able to mouth words but difficult to understand, noted generalized anasarca  Cardiovascular: S1, S2 present  Respiratory:  Rhonchi noted bilaterally  Abdomen: Soft, nontender, nondistended, bowel sounds present, left-sided colostomy bag noted  Musculoskeletal: Noted 3+ bilateral lower extremity edema with bilateral heel protectors  Skin:  Multiple pressure ulcers in multiple locations  Psychiatry:  Unable to assess    Data Reviewed: I have personally reviewed following labs and imaging studies  CBC: Recent Labs  Lab 05/30/20 0242 05/31/20 0530 06/01/20 0345 06/02/20 0430  HGB 8.0* 7.5* 7.2* 7.5*  HCT 25.3* 24.9* 23.0* 25.8*   Basic Metabolic Panel: Recent Labs  Lab 05/30/20 0241 05/30/20 0242 05/31/20 0530 06/01/20 0345 06/01/20 1000 06/02/20 0430 06/03/20 0350  NA 139 138 140 141  --  141 141  K 4.1 4.0 4.6 5.2* 5.3* 5.4* 5.8*  CL 105 104  105 106  --  107 110  CO2 _1 --  23 21*  GLUCOSE 155* 154* 141* 125*  --  125* 108*  BUN 160* 154* 166* 182*  --  188* 192*  CREATININE 2.27* 2.26* 2.33* 2.48*  --  2.55* 2.53*  CALCIUM 9.2 9.3 9.4 9.3  --  9.2 9.0  MG  --   --  2.0  --   --  2.0  --   PHOS 7.3*  --  6.8* 7.0*  --  7.4* 7.9*  GFR: Estimated Creatinine Clearance: 44.3 mL/min (A) (by C-G formula based on SCr of 2.53 mg/dL (H)). Liver Function Tests: Recent Labs  Lab 05/30/20 0241 05/31/20 0530 06/01/20 0345 06/02/20 0430 06/03/20 0350  ALBUMIN 2.4* 2.3* 2.2* 2.1* 2.0*   No results for input(s): LIPASE, AMYLASE in the last 168 hours. No results for input(s): AMMONIA in the last 168 hours. Coagulation Profile: No results for input(s): INR, PROTIME in the last 168 hours. Cardiac Enzymes: No results for input(s): CKTOTAL, CKMB, CKMBINDEX, TROPONINI in the last 168 hours. BNP (last 3 results) No results for input(s): PROBNP in the last 8760 hours. HbA1C: No results for input(s): HGBA1C in the last 72 hours. CBG: Recent Labs  Lab 06/02/20 1104 06/02/20 1805 06/02/20 2347 06/03/20 1215 06/04/20 0814  GLUCAP 104* 103* 100* 96 115*   Lipid Profile: No results for input(s): CHOL, HDL, LDLCALC, TRIG, CHOLHDL, LDLDIRECT in the last 72 hours. Thyroid Function Tests: No results for input(s): TSH, T4TOTAL, FREET4, T3FREE, THYROIDAB in the last 72 hours. Anemia Panel: No results for input(s): VITAMINB12, FOLATE, FERRITIN, TIBC, IRON, RETICCTPCT in the last 72 hours. Sepsis Labs: No results for input(s): PROCALCITON, LATICACIDVEN in the last 168 hours.  No results found for this or any previous visit (from the past 240 hour(s)).  Radiology Studies: No results found. Scheduled Meds: . chlorhexidine gluconate (MEDLINE KIT)  15 mL Mouth Rinse BID  . clonazePAM  0.5 mg Per Tube TID  . fentaNYL  1 patch Transdermal Q72H   And  . fentaNYL  1 patch Transdermal Q72H  . mouth rinse  15 mL Mouth Rinse 10  times per day  . ondansetron (ZOFRAN) IV  4 mg Intravenous Q8H  . pantoprazole sodium  40 mg Per Tube Daily  . sertraline  100 mg Per Tube Daily  . sodium chloride flush  10-40 mL Intracatheter Q12H  . traZODone  100 mg Per Tube QHS   Continuous Infusions: . feeding supplement (OSMOLITE 1.5 CAL) 1,000 mL (06/05/20 1133)     LOS: 37 days     Alma Friendly, MD [facility]   If 7PM-7AM, please contact night-coverage

## 2020-06-06 DIAGNOSIS — R601 Generalized edema: Secondary | ICD-10-CM | POA: Diagnosis not present

## 2020-06-06 DIAGNOSIS — Z515 Encounter for palliative care: Secondary | ICD-10-CM | POA: Diagnosis not present

## 2020-06-06 DIAGNOSIS — R52 Pain, unspecified: Secondary | ICD-10-CM

## 2020-06-06 DIAGNOSIS — A4902 Methicillin resistant Staphylococcus aureus infection, unspecified site: Secondary | ICD-10-CM | POA: Diagnosis not present

## 2020-06-06 DIAGNOSIS — A4159 Other Gram-negative sepsis: Secondary | ICD-10-CM | POA: Diagnosis not present

## 2020-06-06 DIAGNOSIS — A498 Other bacterial infections of unspecified site: Secondary | ICD-10-CM | POA: Diagnosis not present

## 2020-06-06 DIAGNOSIS — R7881 Bacteremia: Secondary | ICD-10-CM | POA: Diagnosis not present

## 2020-06-06 DIAGNOSIS — I959 Hypotension, unspecified: Secondary | ICD-10-CM | POA: Diagnosis not present

## 2020-06-06 DIAGNOSIS — N170 Acute kidney failure with tubular necrosis: Secondary | ICD-10-CM | POA: Diagnosis not present

## 2020-06-06 MED ORDER — HYDROMORPHONE BOLUS VIA INFUSION
1.0000 mg | INTRAVENOUS | Status: DC | PRN
  Filled 2020-06-06: qty 1

## 2020-06-06 MED ORDER — ONDANSETRON 4 MG PO TBDP: 4.0000 mg | ORAL_TABLET | Freq: Four times a day (QID) | ORAL | Status: DC | PRN

## 2020-06-06 MED ORDER — MORPHINE 100MG IN NS 100ML (1MG/ML) PREMIX INFUSION
1.0000 mg/h | INTRAVENOUS | Status: DC
Start: 2020-06-06 — End: 2020-06-06
  Administered 2020-06-06: 1 mg/h via INTRAVENOUS
  Filled 2020-06-06: qty 100

## 2020-06-06 MED ORDER — GLYCOPYRROLATE 0.2 MG/ML IJ SOLN
0.2000 mg | INTRAMUSCULAR | Status: DC
  Administered 2020-06-06 – 2020-06-07 (×4): 0.2 mg via INTRAVENOUS
  Filled 2020-06-06 (×5): qty 1

## 2020-06-06 MED ORDER — CLONAZEPAM 0.5 MG PO TBDP
0.5000 mg | ORAL_TABLET | Freq: Four times a day (QID) | ORAL | Status: DC
  Administered 2020-06-06 (×2): 0.5 mg
  Filled 2020-06-06 (×3): qty 1

## 2020-06-06 MED ORDER — HALOPERIDOL LACTATE 5 MG/ML IJ SOLN: 0.5000 mg | INTRAMUSCULAR | Status: DC | PRN

## 2020-06-06 MED ORDER — BIOTENE DRY MOUTH MT LIQD: 15.0000 mL | OROMUCOSAL | Status: DC | PRN

## 2020-06-06 MED ORDER — ONDANSETRON HCL 4 MG/2ML IJ SOLN: 4.0000 mg | Freq: Four times a day (QID) | INTRAMUSCULAR | Status: DC | PRN

## 2020-06-06 MED ORDER — MORPHINE 100MG IN NS 100ML (1MG/ML) PREMIX INFUSION: 3.0000 mg/h | INTRAVENOUS | Status: DC

## 2020-06-06 MED ORDER — SODIUM CHLORIDE 0.9 % IV SOLN
1.0000 mg/h | INTRAVENOUS | Status: DC
  Administered 2020-06-06: 1 mg/h via INTRAVENOUS
  Administered 2020-06-06: 2 mg/h via INTRAVENOUS
  Filled 2020-06-06 (×2): qty 2.5
  Filled 2020-06-06: qty 5
  Filled 2020-06-06: qty 2.5

## 2020-06-06 MED ORDER — HALOPERIDOL LACTATE 2 MG/ML PO CONC
0.5000 mg | ORAL | Status: DC | PRN
  Filled 2020-06-06: qty 0.3

## 2020-06-06 MED ORDER — HALOPERIDOL 0.5 MG PO TABS
0.5000 mg | ORAL_TABLET | ORAL | Status: DC | PRN
  Filled 2020-06-06: qty 1

## 2020-06-06 MED ORDER — POLYVINYL ALCOHOL 1.4 % OP SOLN
1.0000 [drp] | Freq: Four times a day (QID) | OPHTHALMIC | Status: DC | PRN
  Filled 2020-06-06: qty 15

## 2020-06-06 MED ORDER — ACETAMINOPHEN 500 MG PO TABS
1000.0000 mg | ORAL_TABLET | Freq: Three times a day (TID) | ORAL | Status: DC
  Administered 2020-06-06 (×3): 1000 mg via ORAL
  Filled 2020-06-06 (×5): qty 2

## 2020-06-06 NOTE — Progress Notes (Signed)
Daily Progress Note   Patient Name: Phillip Klein       Date: 06/06/2020 DOB: [birth year]  Age: 55 y.o. MRN#: [id number] Attending Physician: Briant Cedar, MD Primary Care Physician: Corine Shelter, MD Admit Date: 04/10/2020  Reason for Consultation/Follow-up: To discuss complex medical decision making related to patient's goals of care  Subjective:  Spoke with Inetta Fermo at bedside.  Loraine Leriche appears uncomfortable - his eyes are open and he is grimacing and making expressions as though he is in pain.   After speaking with Inetta Fermo I clarified the plan of care.  Marks kidneys are failing.  He will become uremic.  The plan is care is that we continue current call and allow family to visit at bedside until he becomes unresponsive.  Then family is will to remove artificial life support.  We discussed switching the morphine gtt to dilaudid to avoid potential delirium from morphine metabolites in a patient with renal disease.  We discussed the tube feeds.  They are currently going at 65 ml/hour.  As Marks body slows and begins to systematically shut down he will not utilize those calories and the excess fluid will build up making him "wet" and causing difficulty breathing.   Inetta Fermo is still uncomfortable stopping the tube feeds for now.  She expresses concern that suctioning this am by resp therapy produced blood.  This is likely why his BUN is so high.  Inetta Fermo is in agreement with switching out the morphine to dilaudid.  She asks if the HD cath can be removed.   Assessment: Patient appears very uncomfortable.  Will re-round and reassess for improvement in symptoms this afternoon    Patient Profile/HPI:  55 y.o. 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  Admission has been complicated by acute kidney injury which has recovered.  Palliative medicine consulted for goals of care as patient was refusing dressing changes and wound care and other care interventions.  Length of Stay: 38   Vital Signs: BP (!) 100/59   Pulse (!) 58  Temp (!) 92.2 F (33.4 C) (Oral) Comment: applied bear hugger for comfort  Resp 18   Ht 6\' 2"  (1.88 m)   Wt 106.3 kg   SpO2 96%   BMI 30.09 kg/m  SpO2: SpO2: 96 % O2 Device: O2 Device: Ventilator O2 Flow Rate: O2 Flow Rate (L/min): 60 L/min       Palliative Assessment/Data: 20%     Palliative Care Plan    Recommendations/Plan:  Dc morphine gtt.  Morphine may cause delirium in patients with renal failure  Initiate dilaudid gtt for pain, dyspnea, and comfort.  Increase frequency of scheduled clonazepam to match home dose (q 6 hours)  DC HD catheter.  Will re-round this afternoon to assess symptoms.  Comfort Measures - continue Vent and current therapies.  When patient is uremic and non-responsive family will discontinue artificial life support.  Code Status:  DNR  Prognosis:   Hours - Days   Discharge Planning:  Anticipated Hospital Death  Care plan was discussed with Wife, Respiratory and ICU RN  Thank you for allowing the Palliative Medicine Team to assist in the care of this patient.  Total time spent:  35 min.     Greater than 50%  of this time was spent counseling and coordinating care related to the above assessment and plan.  , PA-C Palliative Medicine  Please contact Palliative MedicineTeam phone at [phone] for questions and concerns between 7 am -  7 pm.   Please see AMION for individual provider pager numbers.

## 2020-06-06 NOTE — Progress Notes (Addendum)
eLink Physician-Brief Progress Note Patient Name: Detron Carras DOB: Jun 28, 1965 MRN: [id number]   Date of Service  06/06/2020  HPI/Events of Note  Pt comfort care and placed on MS4 drip @ 1 mg. Is still getting Fentanyl IVP, Pt still looks uncomfortable & grimacing. Requesting to titrate for comfort.  Camera eval done. Trach-vent. Mouth open. Grimacing.   eICU Interventions  Increased to 3mg /hr morphine. Do not have titrating one on comfort care gtt.  Prn fenta for comfort.   Discussed with bed side RN.      Intervention Category Intermediate Interventions: Pain - evaluation and management  06/06/2020, 3:54 AM

## 2020-06-06 NOTE — Progress Notes (Signed)
Pt on Comfort Care. Receiving Morphine gtt at 70mL/hr, and 75 mcg Fentanyl pushes. Still attempting to make comfortable in bed.

## 2020-06-06 NOTE — Progress Notes (Signed)
PROGRESS NOTE    Phillip Klein  OEV:[id number] DOB: [birth year] DOA: 04/28/2020 PCP: [name], MD    Brief Narrative: This 55 years old male with PMH significant for chronic hypoxic respiratory failure with tracheostomy and vent since 2021 after C-spine surgery, GERD, DM, anemia of chronic disease, quadriplegia, malnutrition, neurogenic orthostatic hypotension, untreated HCV, previous IV drug use, cirrhosis, chronic pain syndrome was sent from [facility] with septic shock. Patient admitted in the ICU with septic shock secondary to ESBL/ [name] bacteremia. Patient had Hickman's catheter which was removed, presumed to be the source of infection. Patient has completed meropenem for 10 days after removal of the catheter. Patient has developed acute kidney injury secondary to ischemic ATN from pressors requiring septic shock.,Cardiology,Nephrology is following. PCCM is assisting with vent management. Patient had multiple chronic bedsores,wound care is following. Palliative care was consulted to discuss goals of care.  Patient eventually started on temporary hemodialysis.  Patient is not a candidate for long-term outpatient dialysis because of poor functional status and multiple comorbidities. Family wanted full scope of treatment initially.  Patient is developing uremic symptoms, poor candidate for further dialysis.  He is vent dependent, previous trials at weaning him from ventilator were unsuccessful.  Palliative care is to readdress goals of care.    Plan: Full comfort care related to renal function, no more dialysis, no IV fluids or antibiotics, No symptoms management from renal failure but to continue ventilation and tube feeding until he becomes unresponsive. Started narcotics infusion for comfort. No more wound care    Assessment & Plan:   Principal Problem:   MRSA Pneumonia Active Problems:   Septic shock ([place])   Pressure injury of skin   Malnutrition of  moderate degree   Encounter for feeding tube placement   Hypotension   Quadriplegia (HCC)   Gram-negative bacteremia   Infection due to ESBL-producing [name] pneumoniae   Chronic hepatitis C without hepatic coma (HCC)   Anemia   Jejunostomy tube leak (HCC)   Pleural effusion   Sinus arrest   Pacemaker   HFrEF (heart failure with reduced ejection fraction) (HCC)   Severe malnutrition (HCC)   Anasarca   Acute renal failure superimposed on stage 3b chronic kidney disease (HCC)   Septic shock, POA secondary to [name] bacteremia, UTI versus line infection  -Hickman catheter presumed to be the source of infection, removed. -Blood cultures + [name] bacteremia, UA positive, culture showed multiple species -Completed meropenem for 10 days on 05/12/2020, repeat blood cultures negative -Completed 7 days of linezolid -PICC line inserted on 3/8, venous Doppler showed no DVT. -Required pressors, weaned off  Acute kidney injury sec. to ischemic ATN, chronic hypotension, anasarca -Patient underwent hemodialysis 3/20, 3/21, 3/22 and 3/24 -Full comfort care related to renal function, no more dialysis, no IV fluids or antibiotics, manage symptoms from renal failure but to continue ventilation and tube feeding until he becomes unresponsive  Acute blood loss anemia with intermittent bleeding from wounds -Platelet dysfunction in the setting of severe azotemia. -Nephrology recommended the conjugated estrogen (Premarin) via tube for 5 days, completed 3/20. -Required blood transfusion -Comfort care  Essential hypertension -Comfort care  Acute on chronic combined systolic and diastolic CHF exacerbation with anasarca -Other contributers include severe protein calorie malnutrition with hypoalbuminemia and third spacing -2D echo showed EF of 40 to 45% with global hypokinesis -Nephrology managed with short-term hemodialysis, currently discontinued, comfort care  Chronic ventilator  dependent respiratory failure, quadriplegia PEG placement -Management per PCCM, continue ventilatory support with  trach. -Continue feeding through tube feed. -Patient is ventilator dependent due to respiratory muscle weakness.  Orthostatic hypotension Florinef was discontinued in ICU due to concern for fluid retention, midodrine has been stopped  Chronic pain syndrome Continue pain management  Depression, insomnia Continue sertraline, trazodone, clonazepam  GERD Continue PPI  Multiple pressure wounds, present on admission -Full-thickness vertebral column stage IV -Sacrum stage IV, -Right and left ischial tuberosity, stage IV -Right lateral hip, stage IV -Right and left lower leg lateral, unstageable -Right (unstageable now) and left posterior elbow, stage III - left posterior lower arm, unstageable    Obesity Estimated body mass index is 30.43 kg/m as calculated from the following:   Height as of this encounter: '6\' 2"'  (1.88 m).   Weight as of this encounter: 107.5 kg.   DVT prophylaxis:  SCDs Code Status: DNR Family Communication: Discussed with [name] at bedside Disposition Plan: Anticipate hospital death  Status is: Inpatient  Remains inpatient appropriate because:Inpatient level of care appropriate due to severity of illness   Dispo: The patient is from: [facility]              Anticipated d/c is to: Anticipate hospital death              Patient currently is not medically stable to d/c.   Difficult to place patient Yes    Full comfort care related to renal function, no more dialysis, no IV fluids or antibiotics, No symptoms management from renal failure but to continue ventilation and tube feeding until he becomes unresponsive. Started narcotics infusion for comfort. No more wound care     Consultants:   PCCM  Cardiology  Nephrology  IR  Palliative care  Procedures: Tracheostomy change, GJ tube change, RIJ central line.    Antimicrobials: Anti-infectives (From admission, onward)   Start     Dose/Rate Route Frequency Ordered Stop   05/27/20 2000  linezolid (ZYVOX) 100 MG/5ML suspension 600 mg  Status:  Discontinued        600 mg Per Tube Every 12 hours 05/27/20 0838 05/27/20 1130   05/27/20 2000  linezolid (ZYVOX) tablet 600 mg        600 mg Per Tube Every 12 hours 05/27/20 1130 06/01/20 0925   05/25/20 1845  linezolid (ZYVOX) IVPB 600 mg  Status:  Discontinued        600 mg 300 mL/hr over 60 Minutes Intravenous Every 12 hours 05/25/20 1748 05/27/20 0838   05/19/20 1000  fluconazole (DIFLUCAN) tablet 50 mg       "Followed by" Linked Group Details   50 mg Oral Daily 05/18/20 1048 05/27/20 0929   05/18/20 1145  fluconazole (DIFLUCAN) tablet 100 mg       "Followed by" Linked Group Details   100 mg Oral  Once 05/18/20 1048 05/18/20 1145   05/11/20 0800  meropenem (MERREM) 1 g in sodium chloride 0.9 % 100 mL IVPB        1 g 200 mL/hr over 30 Minutes Intravenous Every 8 hours 05/11/20 0707 05/12/20 1555   05/11/20 0715  meropenem (MERREM) 1 g in sodium chloride 0.9 % 100 mL IVPB  Status:  Discontinued        1 g 200 mL/hr over 30 Minutes Intravenous Every 8 hours 05/11/20 0706 05/11/20 0707   05/03/20 2200  meropenem (MERREM) 1 g in sodium chloride 0.9 % 100 mL IVPB  Status:  Discontinued        1 g 200 mL/hr over 30 Minutes  Intravenous Every 12 hours 05/03/20 1041 05/11/20 0706   04/30/20 1145  meropenem (MERREM) 2 g in sodium chloride 0.9 % 100 mL IVPB  Status:  Discontinued        2 g 200 mL/hr over 30 Minutes Intravenous Every 12 hours 04/30/20 1052 05/03/20 1041   04/30/20 0530  ceFEPIme (MAXIPIME) 2 g in sodium chloride 0.9 % 100 mL IVPB  Status:  Discontinued        2 g 200 mL/hr over 30 Minutes Intravenous Every 12 hours 04/13/2020 1852 04/30/20 1051   04/18/2020 1852  vancomycin variable dose per unstable renal function (pharmacist dosing)  Status:  Discontinued         Does not apply See admin  instructions 04/25/2020 1852 04/30/20 1051   04/30/2020 1630  vancomycin (VANCOREADY) IVPB 2000 mg/400 mL        2,000 mg 200 mL/hr over 120 Minutes Intravenous  Once 04/26/2020 1621 04/18/2020 2008   04/10/2020 1630  ceFEPIme (MAXIPIME) 2 g in sodium chloride 0.9 % 100 mL IVPB        2 g 200 mL/hr over 30 Minutes Intravenous  Once 05/03/2020 1621 04/28/2020 1749     Subjective: Met [name] at bedside, pt appears uncomfortable. Morphine changed to dilaudid drip due to renal failure    Objective: Vitals:   06/06/20 0800 06/06/20 0821 06/06/20 1301 06/06/20 1617  BP: (!) 100/59 (!) 100/59 (!) 94/55 (!) 89/56  Pulse: 77 (!) 58 74 76  Resp: '18 18 18 18  ' Temp:      TempSrc:      SpO2: 99% 96% 98% 97%  Weight:      Height:        Intake/Output Summary (Last 24 hours) at 06/06/2020 1638 Last data filed at 06/06/2020 1400 Gross per 24 hour  Intake 1275.4 ml  Output 550 ml  Net 725.4 ml   Filed Weights   05/28/20 0500 06/01/20 0352 06/03/20 0415  Weight: 103.8 kg 105.7 kg 106.3 kg    Examination:  General: NAD, lethargic, grimacing   Cardiovascular: S1, S2 present  Respiratory:  Rhonchi noted bilaterally  Abdomen: Soft, nontender, nondistended, bowel sounds present, left-sided colostomy bag noted  Musculoskeletal: Noted 3+ bilateral lower extremity edema with bilateral heel protectors  Skin:  Multiple pressure ulcers in multiple locations  Psychiatry:  Unable to assess    Data Reviewed: I have personally reviewed following labs and imaging studies  CBC: Recent Labs  Lab 05/31/20 0530 06/01/20 0345 06/02/20 0430  HGB 7.5* 7.2* 7.5*  HCT 24.9* 23.0* 07.3*   Basic Metabolic Panel: Recent Labs  Lab 05/31/20 0530 06/01/20 0345 06/01/20 1000 06/02/20 0430 06/03/20 0350  NA 140 141  --  141 141  K 4.6 5.2* 5.3* 5.4* 5.8*  CL 105 106  --  107 110  CO2 25 23  --  23 21*  GLUCOSE 141* 125*  --  125* 108*  BUN 166* 182*  --  188* 192*  CREATININE 2.33* 2.48*  --  2.55* 2.53*   CALCIUM 9.4 9.3  --  9.2 9.0  MG 2.0  --   --  2.0  --   PHOS 6.8* 7.0*  --  7.4* 7.9*   GFR: Estimated Creatinine Clearance: 44.3 mL/min (A) (by C-G formula based on SCr of 2.53 mg/dL (H)). Liver Function Tests: Recent Labs  Lab 05/31/20 0530 06/01/20 0345 06/02/20 0430 06/03/20 0350  ALBUMIN 2.3* 2.2* 2.1* 2.0*   No results for input(s): LIPASE, AMYLASE in  the last 168 hours. No results for input(s): AMMONIA in the last 168 hours. Coagulation Profile: No results for input(s): INR, PROTIME in the last 168 hours. Cardiac Enzymes: No results for input(s): CKTOTAL, CKMB, CKMBINDEX, TROPONINI in the last 168 hours. BNP (last 3 results) No results for input(s): PROBNP in the last 8760 hours. HbA1C: No results for input(s): HGBA1C in the last 72 hours. CBG: Recent Labs  Lab 06/02/20 1104 06/02/20 1805 06/02/20 2347 06/03/20 1215 06/04/20 0814  GLUCAP 104* 103* 100* 96 115*   Lipid Profile: No results for input(s): CHOL, HDL, LDLCALC, TRIG, CHOLHDL, LDLDIRECT in the last 72 hours. Thyroid Function Tests: No results for input(s): TSH, T4TOTAL, FREET4, T3FREE, THYROIDAB in the last 72 hours. Anemia Panel: No results for input(s): VITAMINB12, FOLATE, FERRITIN, TIBC, IRON, RETICCTPCT in the last 72 hours. Sepsis Labs: No results for input(s): PROCALCITON, LATICACIDVEN in the last 168 hours.  No results found for this or any previous visit (from the past 240 hour(s)).  Radiology Studies: No results found. Scheduled Meds: . acetaminophen  1,000 mg Oral Q8H  . chlorhexidine gluconate (MEDLINE KIT)  15 mL Mouth Rinse BID  . clonazePAM  0.5 mg Per Tube Q6H  . fentaNYL  1 patch Transdermal Q72H   And  . fentaNYL  1 patch Transdermal Q72H  . glycopyrrolate  0.2 mg Intravenous Q4H  . mouth rinse  15 mL Mouth Rinse 10 times per day  . ondansetron (ZOFRAN) IV  4 mg Intravenous Q8H  . pantoprazole sodium  40 mg Per Tube Daily  . sertraline  100 mg Per Tube Daily  . sodium  chloride flush  10-40 mL Intracatheter Q12H  . traZODone  100 mg Per Tube QHS   Continuous Infusions: . feeding supplement (OSMOLITE 1.5 CAL) 1,000 mL (06/06/20 0137)  . HYDROmorphone 2 mg/hr (06/06/20 1400)     LOS: 38 days     Alma Friendly, MD [facility]   If 7PM-7AM, please contact night-coverage

## 2020-06-06 NOTE — Progress Notes (Signed)
[facility] Physician-Brief Progress Note Patient Name: Phillip Klein DOB: 1965/09/07 MRN: [id number]   Date of Service  06/06/2020  HPI/Events of Note  Bed side RN saying, family want her on morphine gtt as a comfort care. Now on prn hrly fenta IV push.  eICU Interventions  Morphine gtt ordered.      Intervention Category Intermediate Interventions: Other:  Ranee Gosselin 06/06/2020, 12:10 AM

## 2020-07-04 NOTE — Progress Notes (Signed)
Pt tx to morgue. Pt's glasses given to morgue personal who will secure with body.

## 2020-07-04 NOTE — Progress Notes (Signed)
Pt. Passed at 3:40 am per md order Vent was turned off. RN in room.

## 2020-07-04 NOTE — Progress Notes (Signed)
Dilaudid 50mg /149ml wasted in pharmacy Stericycle.

## 2020-07-04 NOTE — Progress Notes (Signed)
Pt is Comfort Care. Pt looks comfortable in bed on 2 mg of Dilaudid gtt. Pt has bolus ordered if needed. No further actions are needed during assessment.

## 2020-07-04 NOTE — Progress Notes (Signed)
Pt's EKG rhythm had significant change. Vtach and multiple PVCs, multiple paced rhythms. Called wife Inetta Fermo and she is coming back to see him.

## 2020-07-04 NOTE — Death Summary Note (Signed)
DEATH SUMMARY   Patient Details  Name: Phillip Klein MRN: [id number] DOB: [birth year]  Admission/Discharge Information   Admit Date:  05/22/2020  Date of Death: Date of Death: 06/30/20  Time of Death: Time of Death: 0340  Length of Stay: 2037-07-27  Referring Physician: Corine Shelter, MD   Reason(s) for Hospitalization  Transferred from [facility] due to septic shock  Diagnoses  Preliminary cause of death:   Septic shock due to ESBL bacteremia  Secondary Diagnoses (including complications and co-morbidities):  Principal Problem:   MRSA Pneumonia Active Problems:   Septic shock (HCC)   Pressure injury of skin   Malnutrition of moderate degree   Encounter for feeding tube placement   Hypotension   Quadriplegia (HCC)   Gram-negative bacteremia   Infection due to ESBL-producing [name] pneumoniae   Chronic hepatitis C without hepatic coma (HCC)   Anemia   Jejunostomy tube leak (HCC)   Pleural effusion   Sinus arrest   Pacemaker   HFrEF (heart failure with reduced ejection fraction) (HCC)   Severe malnutrition (HCC)   Anasarca   Acute renal failure superimposed on stage 3b chronic kidney disease [facility])   Brief Hospital Course (including significant findings, care, treatment, and services provided and events leading to death)  55 years old male with PMH significant for chronic hypoxic respiratory failure with tracheostomy and vent since 28-Jul-2019 after C-spine surgery, GERD, DM, anemia of chronic disease, quadriplegia, malnutrition, neurogenic orthostatic hypotension, untreated HCV, previous IV drug use, cirrhosis, chronic pain syndrome was sent from [facility] with septic shock. Patient admitted in the ICU with septic shock secondary to ESBL/ [name] bacteremia. Patient had Hickman's catheter which was removed, presumed to be the source of infection. Patient has completed meropenem for 10 days after removal of the catheter. Patient has developed acute kidney  injury secondary to ischemic ATN from pressors requiring septic shock.,Cardiology,Nephrology was consulted. PCCM assisted with vent management. Patient had multiple chronic bedsores,wound care consulted. Palliative care was consulted for goals of care.  Patient was started on temporary hemodialysis.  Patient was not a candidate for long-term outpatient dialysis because of poor functional status and multiple comorbidities. Family wanted full scope of treatment initially, but due to worsening uremic symptoms, poor candidate for further dialysis and being vent dependent, pt was made full comfort care. Ventilation and tube feeding were continued, until patient passed away and the vent was turned off.    Septic shock, POA secondary to ESBL [name] bacteremia Hickman catheter presumed to be the source of infection -Blood cultures + [name] bacteremia, UA positive, culture showed multiple species -Completed meropenem for 10 days on 05/12/2020, repeat blood cultures negative -Completed 7 days of linezolid -Required pressors initially, weaned off  Acute kidney injury sec. to ischemic ATN, chronic hypotension, anasarca -Patient underwent hemodialysis 3/20, 3/21, 3/22 and 3/24 -Full comfort care  Acute blood loss anemia with intermittent bleeding from wounds -Platelet dysfunction in the setting of severe azotemia. -Nephrology recommended the conjugated estrogen (Premarin) via tube for 5 days, completed 3/20. -Required blood transfusion  Essential hypertension  Acute on chronic combined systolic and diastolic CHF exacerbation with anasarca -Other contributers include severe protein calorie malnutrition with hypoalbuminemia and third spacing -2D echo showed EF of 40 to 45% with global hypokinesis -Nephrology managed with short-term hemodialysis  Chronic ventilator dependent respiratory failure, quadriplegia PEG placement  Orthostatic hypotension  Chronic pain syndrome  Depression,  insomnia  GERD  Multiple pressure wounds, present on admission -Full-thickness vertebral column stage IV -Sacrum stage  IV, -Right and left ischial tuberosity, stage IV -Right lateral hip, stage IV -Right and left lower leg lateral, unstageable -Right (unstageable now) and left posterior elbow, stage III - left posterior lower arm, unstageable  Obesity Estimated body mass index is 30.43 kg/m as calculated from the following: Height as of this encounter: 6\' 2"  (1.88 m). Weight as of this encounter: 107.5 kg.      Pertinent Labs and Studies  Significant Diagnostic Studies IR GJ Tube Change  Result Date: 05/26/2020 INDICATION: Indwelling 22 French gastrojejunal feeding tube with occlusion of the jejunal lumen. EXAM: GASTROJEJUNAL TUBE EXCHANGE UNDER FLUOROSCOPY MEDICATIONS: None ANESTHESIA/SEDATION: None CONTRAST:  20 mL Omnipaque 300 FLUOROSCOPY TIME:  Fluoroscopy Time: 4 minutes and 12 seconds. 91.7 mGy. COMPLICATIONS: None immediate. PROCEDURE: Informed written consent was obtained from the patient's wife after a thorough discussion of the procedural risks, benefits and alternatives. All questions were addressed. Maximal Sterile Barrier Technique was utilized including caps, mask, sterile gowns, sterile gloves, sterile drape, hand hygiene and skin antiseptic. A timeout was performed prior to the initiation of the procedure. The pre-existing gastrojejunal feeding tube was prepped with chlorhexidine. Fluoroscopy was performed to confirm tube position. The retention balloon was deflated. The tube was retracted under fluoroscopy. The tube was then removed via the gastric lumen over a guidewire. A 5 French catheter was then utilized in obtaining guidewire access through the stomach, duodenum and to the level of the proximal jejunum. A new 22 French balloon retention single-lumen jejunal feeding tube was then advanced over the wire. Tube position was confirmed by fluoroscopy with  injection of contrast material. The retention balloon was inflated with 10 mL of saline. FINDINGS: The pre-existing tube is malpositioned with a redundant loop in the stomach such that the tip of the catheter is in the region of the distal antrum of the stomach and potentially just extending into the proximal duodenum. The jejunal lumen was also occluded. A new 22 French single-lumen catheter was advanced over wire through the distal stomach, duodenum and proximal jejunum with the tip located well into the proximal jejunum. This tube flushes normally and is ready for immediate use. IMPRESSION: Replacement of malpositioned and occluded gastrojejunal feeding tube under fluoroscopy. The pre-existing tube had retracted into the stomach with the tip located in the distal stomach/proximal duodenum. A new single-lumen 22 French balloon retention catheter was placed and advanced such that the tip lies in the proximal jejunum. Electronically Signed   By: Irish LackGlenn  Yamagata M.D.   On: 05/26/2020 16:51   IR Fluoro Guide CV Line Right  Result Date: 05/22/2020 INDICATION: 55 year old male referred for hemodialysis catheter placement EXAM: IMAGE GUIDED PLACEMENT OF TEMPORARY HEMODIALYSIS CATHETER MEDICATIONS: None ANESTHESIA/SEDATION: None FLUOROSCOPY TIME:  Fluoroscopy Time: 0 minutes 6 seconds (0.7 mGy). COMPLICATIONS: None PROCEDURE: Informed written consent was obtained from the patient and the patient's family after a thorough discussion of the procedural risks, benefits and alternatives. All questions were addressed. A timeout was performed prior to the initiation of the procedure. The right neck and chest was prepped with chlorhexidine, and draped in the usual sterile fashion using maximum barrier technique (cap and mask, sterile gown, sterile gloves, large sterile sheet, hand hygiene and cutaneous antiseptic). Local anesthesia was attained by infiltration with 1% lidocaine without epinephrine. Ultrasound demonstrated  patency of the right internal jugular vein, and this was documented with an image. Under real-time ultrasound guidance, this vein was accessed with a 21 gauge micropuncture needle and image documentation was performed. A small dermatotomy  was made at the access site with an 11 scalpel. A 0.018" wire was advanced into the SVC and the access needle exchanged for a 35F micropuncture vascular sheath. The 0.018" wire was then removed and a 0.035" wire advanced into the IVC. Upon withdrawal of the 018 wire, the wire was marked for appropriate length of the internal portion of the catheter. A 16 cm catheter was selected. Skin and subcutaneous tissues were serially dilated. Catheter was placed on the wire. The catheter tip is positioned in the upper right atrium. This was documented with a spot image. Both ports of the hemodialysis catheter were then tested for excellent function. The ports were then locked with heparinized lock. Patient tolerated the procedure well and remained hemodynamically stable throughout. No complications were encountered and no significant blood loss was encountered. IMPRESSION: Status post image guided placement of temporary hemodialysis catheter. Catheter may be converted if needed. Signed, Yvone Neu. Reyne Dumas, RPVI [facility] Radiology Electronically Signed   By: Gilmer Mor D.O.   On: 05/22/2020 14:23   IR US Guide Vasc Access Right  Result Date: 05/22/2020 INDICATION: 55 year old male referred for hemodialysis catheter placement EXAM: IMAGE GUIDED PLACEMENT OF TEMPORARY HEMODIALYSIS CATHETER MEDICATIONS: None ANESTHESIA/SEDATION: None FLUOROSCOPY TIME:  Fluoroscopy Time: 0 minutes 6 seconds (0.7 mGy). COMPLICATIONS: None PROCEDURE: Informed written consent was obtained from the patient and the patient's family after a thorough discussion of the procedural risks, benefits and alternatives. All questions were addressed. A timeout was  performed prior to the initiation of the procedure. The right neck and chest was prepped with chlorhexidine, and draped in the usual sterile fashion using maximum barrier technique (cap and mask, sterile gown, sterile gloves, large sterile sheet, hand hygiene and cutaneous antiseptic). Local anesthesia was attained by infiltration with 1% lidocaine without epinephrine. Ultrasound demonstrated patency of the right internal jugular vein, and this was documented with an image. Under real-time ultrasound guidance, this vein was accessed with a 21 gauge micropuncture needle and image documentation was performed. A small dermatotomy was made at the access site with an 11 scalpel. A 0.018" wire was advanced into the SVC and the access needle exchanged for a 35F micropuncture vascular sheath. The 0.018" wire was then removed and a 0.035" wire advanced into the IVC. Upon withdrawal of the 018 wire, the wire was marked for appropriate length of the internal portion of the catheter. A 16 cm catheter was selected. Skin and subcutaneous tissues were serially dilated. Catheter was placed on the wire. The catheter tip is positioned in the upper right atrium. This was documented with a spot image. Both ports of the hemodialysis catheter were then tested for excellent function. The ports were then locked with heparinized lock. Patient tolerated the procedure well and remained hemodynamically stable throughout. No complications were encountered and no significant blood loss was encountered. IMPRESSION: Status post image guided placement of temporary hemodialysis catheter. Catheter may be converted if needed. Signed, Yvone Neu. Reyne Dumas, RPVI [facility] Radiology Electronically Signed   By: Gilmer Mor D.O.   On: 05/22/2020 14:23   DG CHEST PORT 1 VIEW  Result Date: 05/25/2020 CLINICAL DATA:  Pneumonia EXAM: PORTABLE CHEST 1 VIEW COMPARISON:  May 12, 2020 FINDINGS: Tracheostomy  catheter tip is 4.2 cm above the carina. Central catheter tips are in the superior vena cava. No pneumothorax. There are small pleural effusions bilaterally. There is airspace opacity in the right mid lung region as well  as in each lung base region. Heart is enlarged with mild pulmonary venous hypertension. No adenopathy. Postoperative change noted in lower cervical and upper thoracic regions. Apparent leadless pacemaker present inferiorly slightly to the left of midline. IMPRESSION: Cardiomegaly with pulmonary vascular congestion. Small pleural effusions bilaterally. Airspace opacity in the right mid lung and bibasilar regions concerning for areas of pneumonia, although alveolar edema could present in this manner. Both edema and pneumonia may present concurrently. Leadless pacemaker present on the left inferiorly. Tube and catheter positions as described. No evident pneumothorax. Electronically Signed   By: Bretta Bang III M.D.   On: 05/25/2020 15:28   DG CHEST PORT 1 VIEW  Result Date: 05/12/2020 CLINICAL DATA:  Shortness of breath EXAM: PORTABLE CHEST 1 VIEW COMPARISON:  None. FINDINGS: Tracheostomy device is present.  Right PICC line tip overlies SVC. Persistent bilateral pleural effusions with bibasilar atelectasis. Left lung aeration is improved. Persistent mild perihilar edema. Similar cardiomediastinal contours. IMPRESSION: Persistent bilateral pleural effusions and bibasilar atelectasis. Persistent but improved probable pulmonary edema. Electronically Signed   By: Guadlupe Spanish M.D.   On: 05/12/2020 16:53   DG CHEST PORT 1 VIEW  Result Date: 05/11/2020 CLINICAL DATA:  Shortness of breath, unable to elevate chin EXAM: PORTABLE CHEST 1 VIEW COMPARISON:  Radiograph 05/08/2020 FINDINGS: Tracheostomy tube tip terminates in the mid trachea. Right upper extremity PICC tip terminates near the superior cavoatrial junction. Telemetry leads overlie the chest. Lung volumes are diminished from comparison  exam. Increasing hazy interstitial opacities with a patchy perihilar and basilar predominance and gradient density obscuring the hemidiaphragms likely reflecting worsening pulmonary edema with increasing layering bilateral effusions accentuated by low volumes. Prominence of the cardiomediastinal silhouette, poorly visualized due to overlying opacity. Chin overlies the lung apices. No acute osseous or soft tissue abnormality. IMPRESSION: Worsening pulmonary edema and bilateral effusions. Support devices as above. Electronically Signed   By: Kreg Shropshire M.D.   On: 05/11/2020 20:36   DG Shoulder Left  Result Date: 05/12/2020 CLINICAL DATA:  Left shoulder pain and bruising. EXAM: LEFT SHOULDER - 2+ VIEW COMPARISON:  None. FINDINGS: There is no evidence of fracture or dislocation. There is no evidence of arthropathy or other focal bone abnormality. Soft tissues are unremarkable. IMPRESSION: Negative exam. Electronically Signed   By: Drusilla Kanner M.D.   On: 05/12/2020 15:57   VAS [place] UPPER EXTREMITY VENOUS DUPLEX  Result Date: 05/11/2020 UPPER VENOUS STUDY  Indications: Swelling Comparison Study: no prior Performing Technologist: Blanch Media RVS  Examination Guidelines: A complete evaluation includes B-mode imaging, spectral Doppler, color Doppler, and power Doppler as needed of all accessible portions of each vessel. Bilateral testing is considered an integral part of a complete examination. Limited examinations for reoccurring indications may be performed as noted.  Left Findings: +----------+------------+---------+-----------+----------+--------------+ LEFT      CompressiblePhasicitySpontaneousProperties   Summary     +----------+------------+---------+-----------+----------+--------------+ IJV                                                 Not visualized +----------+------------+---------+-----------+----------+--------------+ Subclavian    Full       Yes       Yes                              +----------+------------+---------+-----------+----------+--------------+ Axillary  Full       Yes       Yes                             +----------+------------+---------+-----------+----------+--------------+ Brachial      Full       Yes       Yes                             +----------+------------+---------+-----------+----------+--------------+ Radial        Full                                                 +----------+------------+---------+-----------+----------+--------------+ Ulnar         Full                                                 +----------+------------+---------+-----------+----------+--------------+ Cephalic      None                                                 +----------+------------+---------+-----------+----------+--------------+ Basilic       Full                                                 +----------+------------+---------+-----------+----------+--------------+  Summary:  Left: No evidence of deep vein thrombosis in the upper extremity. Findings consistent with age indeterminate superficial vein thrombosis involving the left cephalic vein.  *See table(s) above for measurements and observations.  Diagnosing physician: Waverly Ferrari MD Electronically signed by Waverly Ferrari MD on 05/11/2020 at 5:02:39 PM.    Final    ECHOCARDIOGRAM LIMITED  Result Date: 05/14/2020    ECHOCARDIOGRAM LIMITED REPORT   Patient Name:   MONTI Hove Date of Exam: 05/14/2020 Medical Rec #:  [id number]        Height:       74.0 in Accession #:    [id number]       Weight:       251.1 lb Date of Birth:  1965-04-23        BSA:          2.394 m Patient Age:    54 years         BP:           153/88 mmHg Patient Gender: M                HR:           83 bpm. Exam Location:  Inpatient Procedure: Limited Echo, Limited Color Doppler and Cardiac Doppler Indications:    CHF-Acute Diastolic I50.31  History:        Patient has prior history of  Echocardiogram examinations, most                 recent 04/30/2020. Pacemaker; Risk Factors:Diabetes.  Sonographer:    Thurman Coyer RDCS (AE)  Referring Phys: [id number] MIHAI CROITORU IMPRESSIONS  1. Left ventricular ejection fraction, by estimation, is 55%. The left ventricle has normal function. Left ventricular diastolic parameters were normal.  2. Right ventricular systolic function is normal. The right ventricular size is moderately enlarged. A leadless pacemaker is visualized.  3. Trivial mitral valve regurgitation. FINDINGS  Left Ventricle: Left ventricular ejection fraction, by estimation, is 55%. The left ventricle has normal function. Left ventricular diastolic parameters were normal. Right Ventricle: The right ventricular size is moderately enlarged. Right ventricular systolic function is normal. Mitral Valve: Trivial mitral valve regurgitation. Additional Comments: A leadless pacemaker is visualized.  Diastology LV e' medial:    9.57 cm/s LV E/e' medial:  10.0 LV e' lateral:   14.90 cm/s LV E/e' lateral: 6.4  MITRAL VALVE MV Area (PHT): 3.77 cm MV Decel Time: 201 msec MV E velocity: 95.70 cm/s MV A velocity: 65.60 cm/s MV E/A ratio:  1.46 Weston Brass MD Electronically signed by Weston Brass MD Signature Date/Time: 05/14/2020/12:56:19 PM    Final    [place] EKG SITE RITE  Result Date: 05/11/2020 If Site Rite image not attached, placement could not be confirmed due to current cardiac rhythm.   Microbiology No results found for this or any previous visit (from the past 240 hour(s)).  Lab Basic Metabolic Panel: Recent Labs  Lab 06/01/20 0345 06/01/20 1000 06/02/20 0430 06/03/20 0350  NA 141  --  141 141  K 5.2* 5.3* 5.4* 5.8*  CL 106  --  107 110  CO2 23  --  23 21*  GLUCOSE 125*  --  125* 108*  BUN 182*  --  188* 192*  CREATININE 2.48*  --  2.55* 2.53*  CALCIUM 9.3  --  9.2 9.0  MG  --   --  2.0  --   PHOS 7.0*  --  7.4* 7.9*   Liver Function Tests: Recent Labs  Lab  06/01/20 0345 06/02/20 0430 06/03/20 0350  ALBUMIN 2.2* 2.1* 2.0*   No results for input(s): LIPASE, AMYLASE in the last 168 hours. No results for input(s): AMMONIA in the last 168 hours. CBC: Recent Labs  Lab 06/01/20 0345 06/02/20 0430  HGB 7.2* 7.5*  HCT 23.0* 24.5*   Cardiac Enzymes: No results for input(s): CKTOTAL, CKMB, CKMBINDEX, TROPONINI in the last 168 hours. Sepsis Labs: No results for input(s): PROCALCITON, WBC, LATICACIDVEN in the last 168 hours.  Procedures/Operations  None   Briant Cedar 06/14/2020, 5:42 PM

## 2020-07-04 NOTE — Progress Notes (Signed)
Pt's EKG rhythm went to asystole. No heartbeat auscultated. Carotid and Femoral pulses attempted, with no pulses felt. Time of Death notified with Rebbeca Paul, RN at [phone]. Wife is still coming from home.   Ventilator turned off per MD order, but kept in room. All IV medications and tube feeding turned off.

## 2020-07-04 NOTE — Progress Notes (Signed)
Called Phillip Klein to check on ETA, since she said she was on her way at time 0228. Wife not informed of patient's passing during call due to concern of safety while driving. Phillip Klein stated she was 20 minutes away at the time of this note.

## 2020-07-04 NOTE — Progress Notes (Signed)
Phillip Klein arrived and is now in pt's room with chaplain.  Wasted 34mL of Dilaudid in stericycle of med room. Witnessed by Tory Emerald, RN.

## 2020-07-04 NOTE — Progress Notes (Signed)
   07/02/2020 0800  Clinical Encounter Type  Visited With Patient and family together  Visit Type Death  Referral From Nurse  Consult/Referral To Chaplain  Spiritual Encounters  Spiritual Needs Grief support;Emotional;Prayer  The chaplain responded to page for patient death. The patient's wife Inetta Fermo) was at bedside. The chaplain spoke with the wife about funeral arrangements. The patient will be intrusted with the [facility] in [place]. The chaplain gave the patient a placement card. The chaplain offered prayer for the wife and words of comfort.

## 2020-07-04 DEATH — deceased

## 2023-01-02 IMAGING — DX DG CHEST 1V PORT
1 series · 1 of 1 positions shown · non-contrast
Comparison: None.

CLINICAL DATA: Hypotension.

EXAM:
PORTABLE CHEST 1 VIEW

[chest ap]
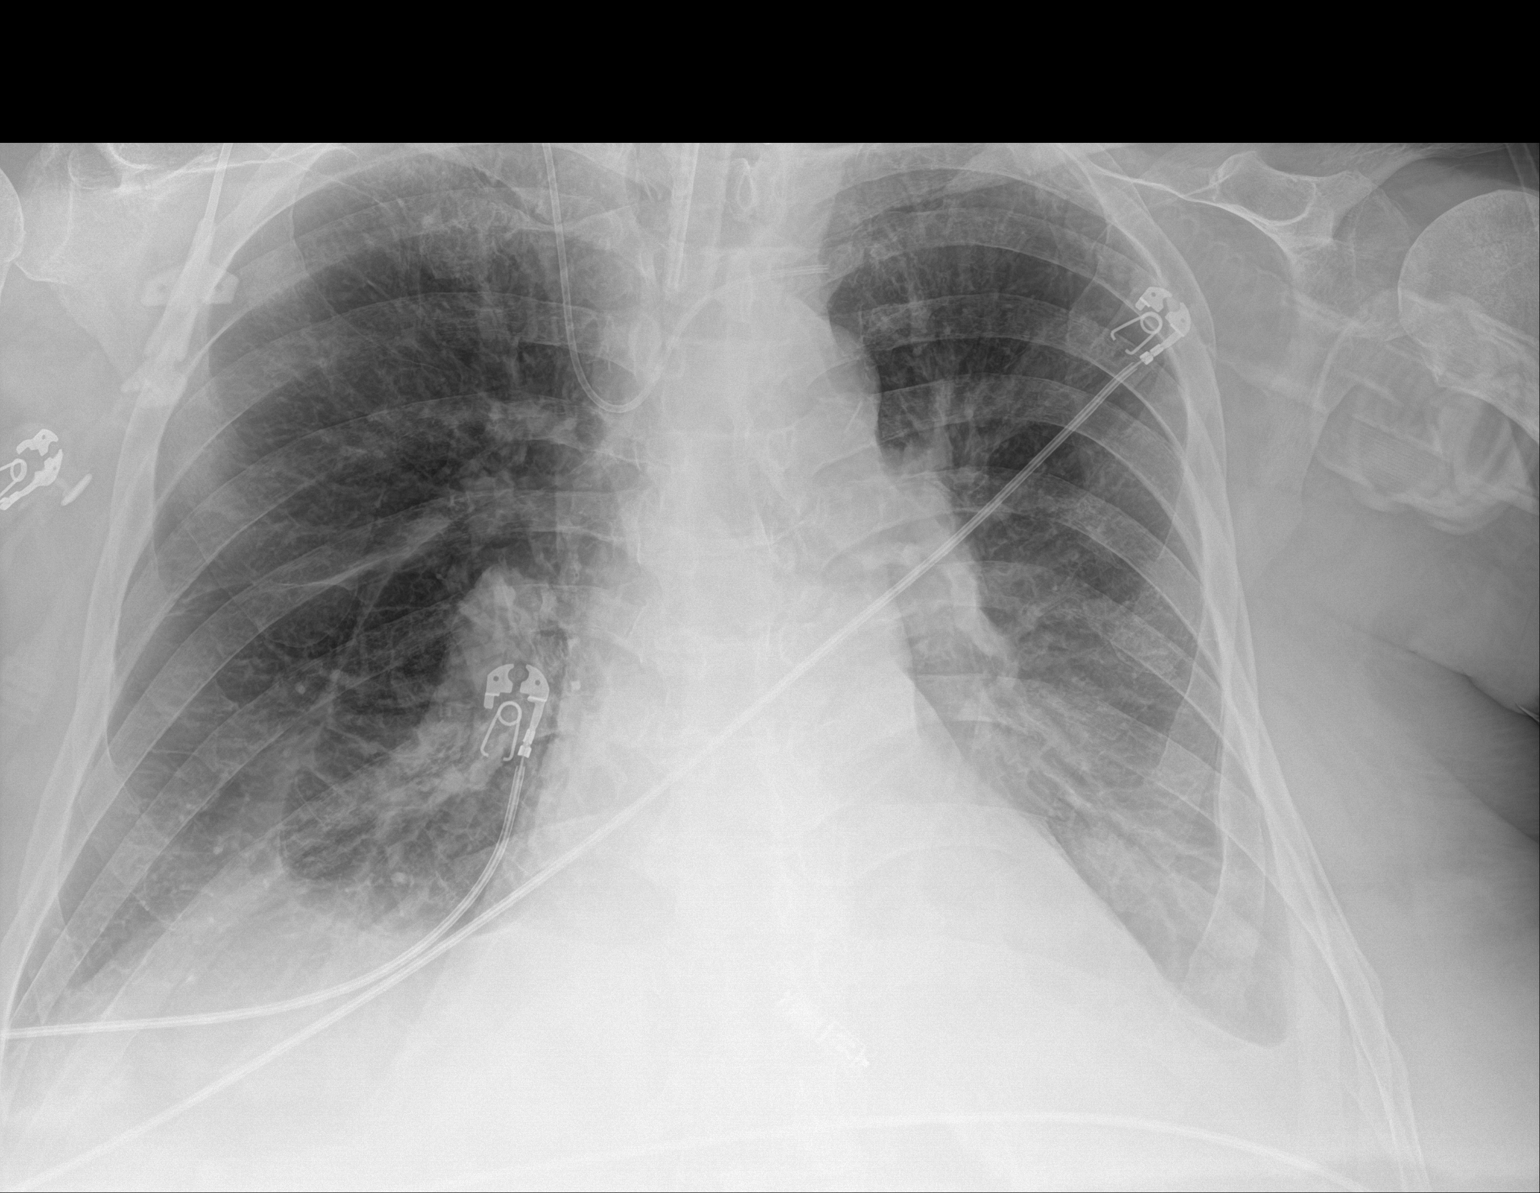

[1 of 1 positions shown; findings below may reference images not displayed]

FINDINGS: Dense retrocardiac opacity. Right basilar atelectasis. Possible
small left pleural effusion. No visible pneumothorax. Central venous
catheter tip projects left of midline, most likely in the left
brachiocephalic vein. Tracheostomy tube tip projects 3.7 cm above
the carina. Partially imaged cervical ACDF. Mild enlargement the
cardiac silhouette. Suspected loop recorder projects over the
cardiac silhouette.
IMPRESSION: 1. Dense left retrocardiac opacity, concerning for left lower lobe
collapse. Aspiration or pneumonia is not excluded. Recommend
follow-up to resolution.
2. Possible small left pleural effusion.
3. Central venous catheter tip projects left of midline, most likely
in the left brachiocephalic vein.

Findings discussed with Tiger via telephone at [DATE].

## 2023-01-03 IMAGING — DX DG ABDOMEN 1V
1 series · 1 of 1 positions shown · non-contrast
Comparison: Portable exam 1139 hours without priors for comparison

CLINICAL DATA: Jejunostomy tube leak

EXAM:
ABDOMEN - 1 VIEW

[abdomen]
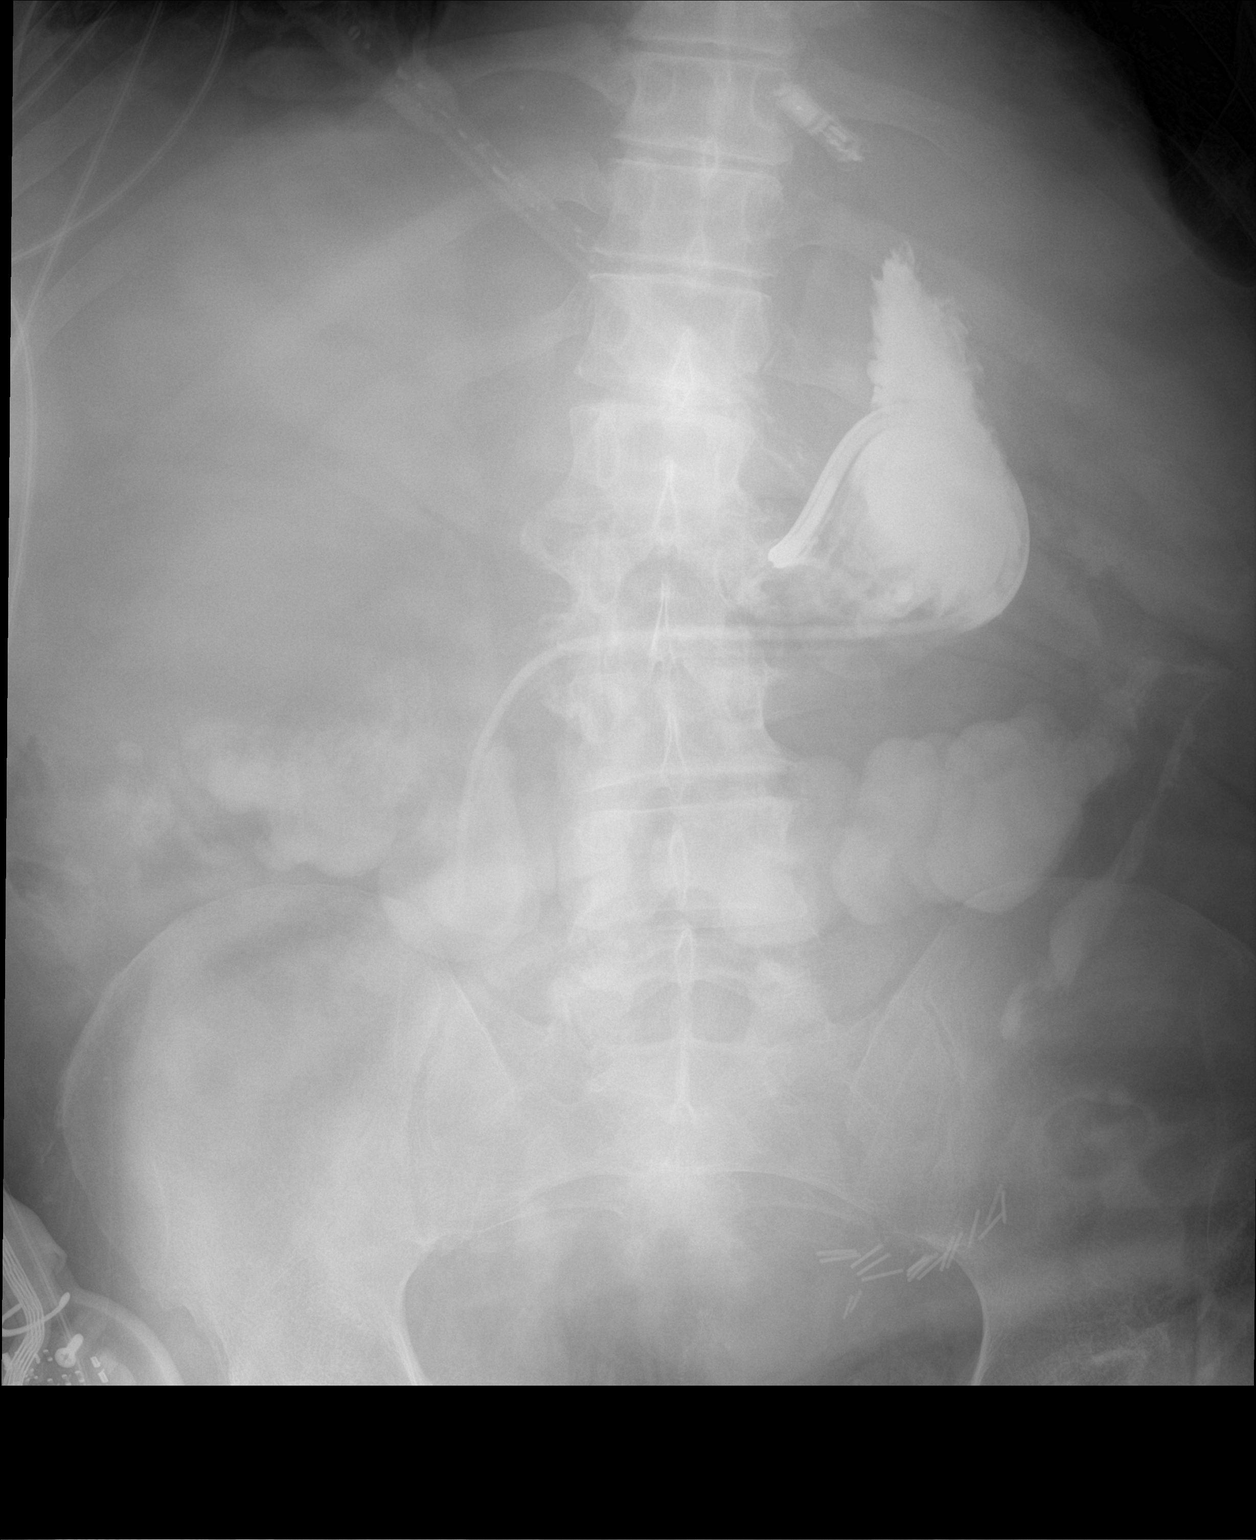

[1 of 1 positions shown; findings below may reference images not displayed]

FINDINGS: Contrast was injected through the indwelling tube and an image was
obtained.

Contrast opacifies what is likely the gastric lumen.

No extravasation of contrast seen.

Radiopaque material within transverse and descending colon question
additional recent contrast administration.

Lead less pacemaker projects over cardiac silhouette.

Surgical clips in LEFT pelvis.
IMPRESSION: Injected contrast material opacifies what appears to be stomach
rather than jejunum, question gastrostomy tube not jejunostomy tube.

No extravasated contrast identified.

Additional apparent contrast material within transverse colon and
descending colon, question additional recent contrast
administration.

## 2023-01-04 IMAGING — DX DG CHEST 1V PORT
1 series · 1 of 1 positions shown · non-contrast
Comparison: 04/30/2020 portable chest.

CLINICAL DATA: 54-year-old male status post central line placement.

EXAM:
PORTABLE CHEST 1 VIEW

[chest ap]
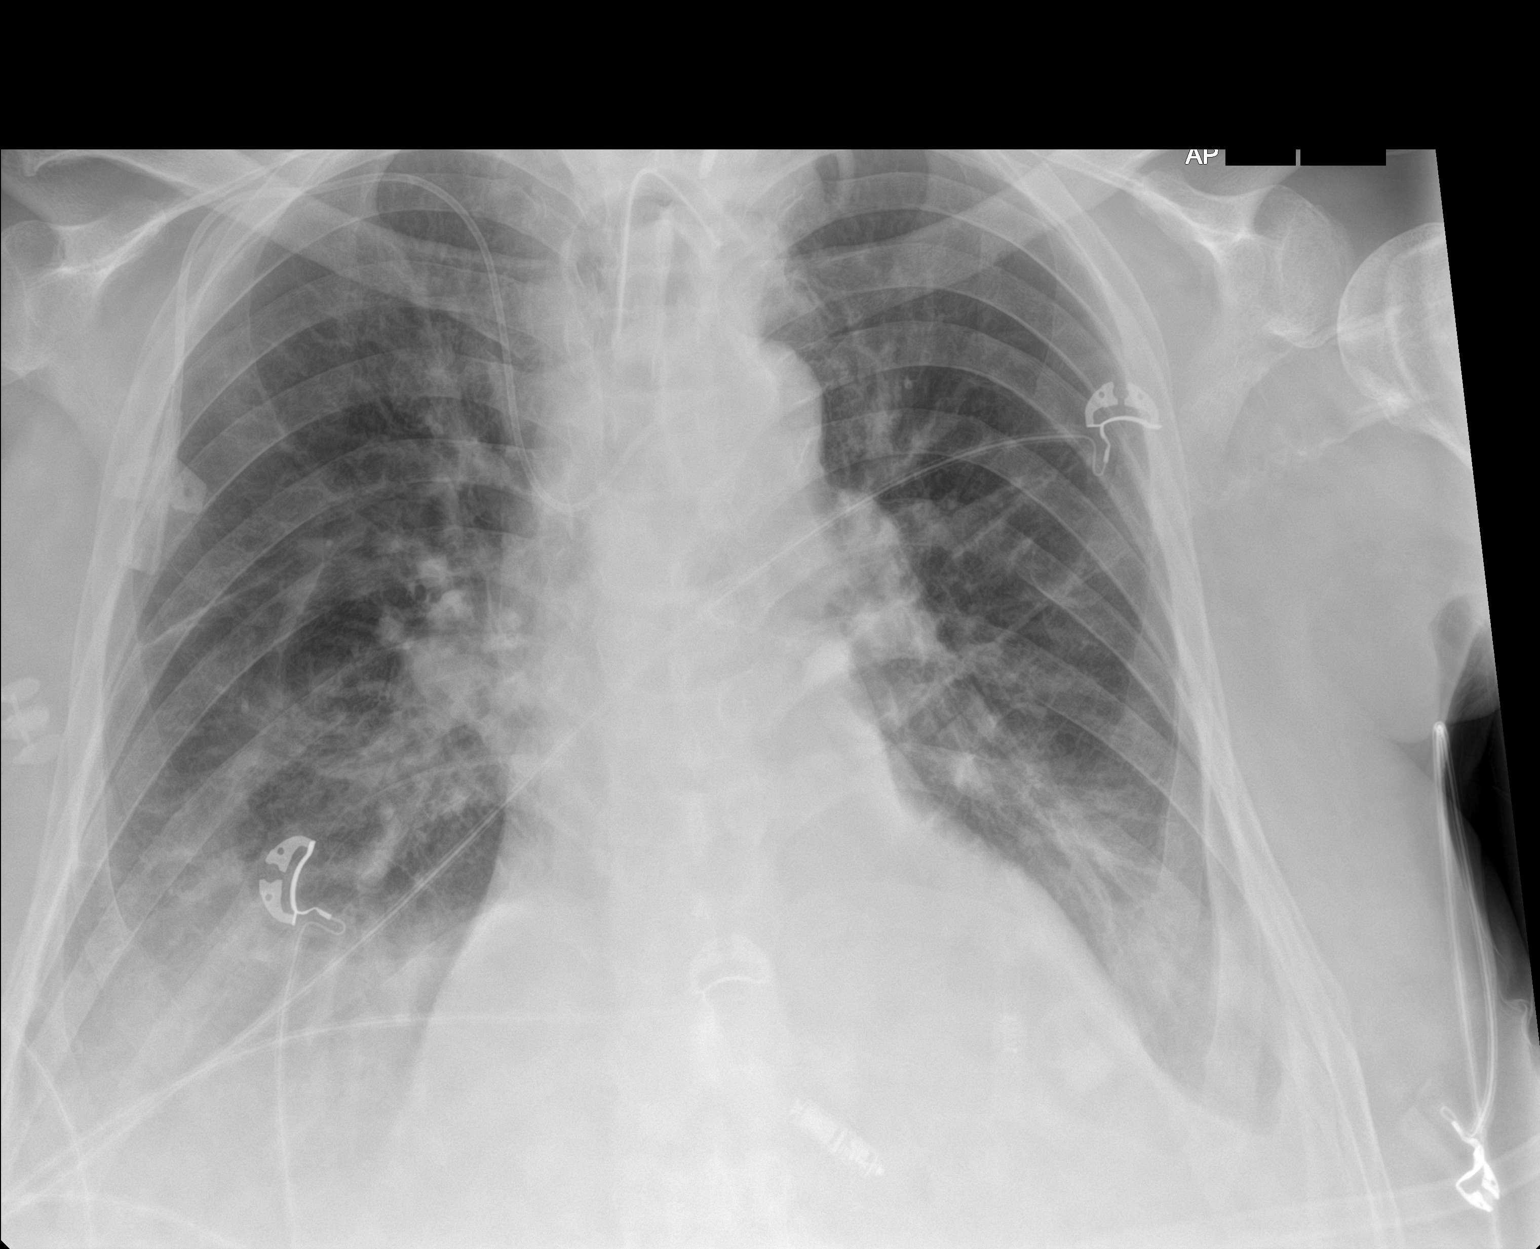

[1 of 1 positions shown; findings below may reference images not displayed]

FINDINGS: Portable AP upright view at 4484 hours. Right subclavian versus IJ
approach central line catheter configuration is unchanged from
yesterday, the catheter tip continues across midline and terminates
in the left innominate vein as before.

Stable tracheostomy. Partially visible posterior cervicothoracic
junction spine fusion hardware. Small cardiac event recorder or
superficial ICD. Stable cardiac size and mediastinal contours.
Continued veiling and confluent bibasilar opacity. No pneumothorax.
Pulmonary vascularity appears mildly improved. No overt edema in the
upper lungs.
IMPRESSION: 1. Unchanged right side central line with tip at the left innominate
vein level. Recommend repositioning to avoid innominate vein
sclerosis.
2. Decreased pulmonary vascular congestion. Otherwise stable
ventilation with evidence of bilateral pleural effusions with lower
lobe collapse or consolidation.
